# Patient Record
Sex: Female | Born: 1975 | Race: Black or African American | Hispanic: No | Marital: Married | Smoking: Former smoker
Health system: Southern US, Community
[De-identification: ages and names within clinical notes are randomized; demographics above are authoritative.]

## PROBLEM LIST (undated history)

## (undated) DIAGNOSIS — J45909 Unspecified asthma, uncomplicated: Secondary | ICD-10-CM

## (undated) DIAGNOSIS — J449 Chronic obstructive pulmonary disease, unspecified: Secondary | ICD-10-CM

## (undated) DIAGNOSIS — G47419 Narcolepsy without cataplexy: Secondary | ICD-10-CM

## (undated) DIAGNOSIS — E079 Disorder of thyroid, unspecified: Secondary | ICD-10-CM

## (undated) DIAGNOSIS — I509 Heart failure, unspecified: Secondary | ICD-10-CM

## (undated) DIAGNOSIS — G4733 Obstructive sleep apnea (adult) (pediatric): Secondary | ICD-10-CM

## (undated) DIAGNOSIS — E119 Type 2 diabetes mellitus without complications: Secondary | ICD-10-CM

## (undated) DIAGNOSIS — G4730 Sleep apnea, unspecified: Secondary | ICD-10-CM

## (undated) DIAGNOSIS — I10 Essential (primary) hypertension: Secondary | ICD-10-CM

## (undated) DIAGNOSIS — R0602 Shortness of breath: Secondary | ICD-10-CM

## (undated) DIAGNOSIS — F319 Bipolar disorder, unspecified: Secondary | ICD-10-CM

## (undated) DIAGNOSIS — K219 Gastro-esophageal reflux disease without esophagitis: Secondary | ICD-10-CM

## (undated) DIAGNOSIS — E039 Hypothyroidism, unspecified: Secondary | ICD-10-CM

## (undated) DIAGNOSIS — Z720 Tobacco use: Secondary | ICD-10-CM

## (undated) DIAGNOSIS — R0789 Other chest pain: Secondary | ICD-10-CM

## (undated) DIAGNOSIS — M797 Fibromyalgia: Secondary | ICD-10-CM

## (undated) DIAGNOSIS — R55 Syncope and collapse: Secondary | ICD-10-CM

## (undated) HISTORY — PX: HX GALL BLADDER SURGERY/CHOLE: SHX55

## (undated) HISTORY — DX: Narcolepsy without cataplexy: G47.419

## (undated) HISTORY — PX: SHOULDER SURGERY: SHX246

## (undated) HISTORY — DX: Shortness of breath: R06.02

## (undated) HISTORY — PX: HX TUBAL LIGATION: SHX77

## (undated) HISTORY — DX: Obstructive sleep apnea (adult) (pediatric): G47.33

## (undated) HISTORY — DX: Sleep apnea, unspecified: G47.30

## (undated) HISTORY — DX: Type 2 diabetes mellitus without complications: E11.9

## (undated) HISTORY — DX: Fibromyalgia: M79.7

## (undated) HISTORY — DX: Other chest pain: R07.89

## (undated) HISTORY — PX: OTHER SURGICAL HISTORY: SHX169

## (undated) HISTORY — DX: Bipolar disorder, unspecified: F31.9

## (undated) HISTORY — DX: Hypothyroidism, unspecified: E03.9

## (undated) HISTORY — DX: Tobacco use: Z72.0

## (undated) HISTORY — DX: Gastro-esophageal reflux disease without esophagitis: K21.9

## (undated) HISTORY — DX: Syncope and collapse: R55

## (undated) HISTORY — PX: CHOLECYSTECTOMY: SHX55

---
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
## 2017-02-05 ENCOUNTER — Encounter (HOSPITAL_COMMUNITY): Payer: Self-pay | Admitting: Emergency Medicine

## 2017-02-05 DIAGNOSIS — R42 Dizziness and giddiness: Secondary | ICD-10-CM | POA: Insufficient documentation

## 2017-02-05 DIAGNOSIS — R51 Headache: Secondary | ICD-10-CM | POA: Insufficient documentation

## 2017-02-05 DIAGNOSIS — R112 Nausea with vomiting, unspecified: Secondary | ICD-10-CM | POA: Insufficient documentation

## 2017-02-05 DIAGNOSIS — Z5321 Procedure and treatment not carried out due to patient leaving prior to being seen by health care provider: Secondary | ICD-10-CM | POA: Insufficient documentation

## 2017-02-05 LAB — BASIC METABOLIC PANEL
Anion gap: 11 (ref 5–15)
BUN: 6 mg/dL (ref 6–20)
CO2: 21 mmol/L — AB (ref 22–32)
CREATININE: 0.71 mg/dL (ref 0.44–1.00)
Calcium: 8.8 mg/dL — ABNORMAL LOW (ref 8.9–10.3)
Chloride: 100 mmol/L — ABNORMAL LOW (ref 101–111)
GFR calc Af Amer: >60 mL/min (ref >60)
GFR calc non Af Amer: >60 mL/min (ref >60)
Glucose, Bld: 315 mg/dL — ABNORMAL HIGH (ref 65–99)
Potassium: 3.3 mmol/L — ABNORMAL LOW (ref 3.5–5.1)
Sodium: 132 mmol/L — ABNORMAL LOW (ref 135–145)

## 2017-02-05 LAB — CBC
HEMATOCRIT: 40 % (ref 36.0–46.0)
Hemoglobin: 13.5 g/dL (ref 12.0–15.0)
MCH: 29.3 pg (ref 26.0–34.0)
MCHC: 33.8 g/dL (ref 30.0–36.0)
MCV: 87 fL (ref 78.0–100.0)
Platelets: 218 10*3/uL (ref 150–400)
RBC: 4.6 MIL/uL (ref 3.87–5.11)
RDW: 12.7 % (ref 11.5–15.5)
WBC: 8.6 10*3/uL (ref 4.0–10.5)

## 2017-02-05 LAB — CBG MONITORING, ED: Glucose-Capillary: 316 mg/dL — ABNORMAL HIGH (ref 65–99)

## 2017-02-05 NOTE — ED Triage Notes (Signed)
Reports feeling bad for a couple of weeks.  Headache today with dizziness since yesterday.  N/V today. Reports feeling like she is going to pass out.

## 2017-02-05 NOTE — ED Notes (Signed)
Spoke with dr Jacqulyn BathLong regarding neuro symptoms.  Orders received and placed.

## 2017-02-06 ENCOUNTER — Emergency Department (HOSPITAL_COMMUNITY)
Admission: EM | Admit: 2017-02-06 | Discharge: 2017-02-06 | Disposition: A | Discharge status: Home / Self Care | Payer: Self-pay | Attending: Emergency Medicine | Admitting: Emergency Medicine

## 2017-02-06 HISTORY — DX: Essential (primary) hypertension: I10

## 2017-02-06 HISTORY — DX: Chronic obstructive pulmonary disease, unspecified: J44.9

## 2017-02-06 LAB — HCG, QUANTITATIVE, PREGNANCY

## 2017-02-06 NOTE — ED Notes (Signed)
Pt walked to desk and stated she couldn't wait any longer. Pt encouraged to stay, pt was seen walking out the lobby.

## 2017-03-15 ENCOUNTER — Encounter (HOSPITAL_COMMUNITY): Payer: Self-pay | Admitting: Emergency Medicine

## 2017-03-15 ENCOUNTER — Emergency Department (HOSPITAL_COMMUNITY)
Admission: EM | Admit: 2017-03-15 | Discharge: 2017-03-15 | Disposition: A | Discharge status: Home / Self Care | Payer: Self-pay | Attending: Emergency Medicine | Admitting: Emergency Medicine

## 2017-03-15 DIAGNOSIS — E063 Autoimmune thyroiditis: Secondary | ICD-10-CM

## 2017-03-15 DIAGNOSIS — E860 Dehydration: Secondary | ICD-10-CM | POA: Insufficient documentation

## 2017-03-15 DIAGNOSIS — J449 Chronic obstructive pulmonary disease, unspecified: Secondary | ICD-10-CM | POA: Insufficient documentation

## 2017-03-15 DIAGNOSIS — I10 Essential (primary) hypertension: Secondary | ICD-10-CM | POA: Insufficient documentation

## 2017-03-15 DIAGNOSIS — E1165 Type 2 diabetes mellitus with hyperglycemia: Secondary | ICD-10-CM | POA: Insufficient documentation

## 2017-03-15 DIAGNOSIS — Z79899 Other long term (current) drug therapy: Secondary | ICD-10-CM | POA: Insufficient documentation

## 2017-03-15 DIAGNOSIS — E038 Other specified hypothyroidism: Secondary | ICD-10-CM

## 2017-03-15 DIAGNOSIS — E039 Hypothyroidism, unspecified: Secondary | ICD-10-CM | POA: Insufficient documentation

## 2017-03-15 DIAGNOSIS — R739 Hyperglycemia, unspecified: Secondary | ICD-10-CM

## 2017-03-15 DIAGNOSIS — Z794 Long term (current) use of insulin: Secondary | ICD-10-CM | POA: Insufficient documentation

## 2017-03-15 DIAGNOSIS — F17210 Nicotine dependence, cigarettes, uncomplicated: Secondary | ICD-10-CM | POA: Insufficient documentation

## 2017-03-15 LAB — COMPREHENSIVE METABOLIC PANEL
ALBUMIN: 3.7 g/dL (ref 3.5–5.0)
ALT: 26 U/L (ref 14–54)
ANION GAP: 11 (ref 5–15)
AST: 30 U/L (ref 15–41)
Alkaline Phosphatase: 108 U/L (ref 38–126)
BUN: 8 mg/dL (ref 6–20)
CHLORIDE: 99 mmol/L — AB (ref 101–111)
CO2: 22 mmol/L (ref 22–32)
Calcium: 8.9 mg/dL (ref 8.9–10.3)
Creatinine, Ser: 0.77 mg/dL (ref 0.44–1.00)
GFR calc Af Amer: >60 mL/min (ref >60)
GFR calc non Af Amer: >60 mL/min (ref >60)
Glucose, Bld: 404 mg/dL — ABNORMAL HIGH (ref 65–99)
POTASSIUM: 4.1 mmol/L (ref 3.5–5.1)
SODIUM: 132 mmol/L — AB (ref 135–145)
TOTAL PROTEIN: 6.8 g/dL (ref 6.5–8.1)
Total Bilirubin: 0.8 mg/dL (ref 0.3–1.2)

## 2017-03-15 LAB — CBC WITH DIFFERENTIAL/PLATELET
Basophils Absolute: 0 10*3/uL (ref 0.0–0.1)
Basophils Relative: 0 %
Eosinophils Absolute: 0.1 10*3/uL (ref 0.0–0.7)
Eosinophils Relative: 2 %
HEMATOCRIT: 42 % (ref 36.0–46.0)
HEMOGLOBIN: 14.5 g/dL (ref 12.0–15.0)
LYMPHS ABS: 1.8 10*3/uL (ref 0.7–4.0)
LYMPHS PCT: 26 %
MCH: 30 pg (ref 26.0–34.0)
MCHC: 34.5 g/dL (ref 30.0–36.0)
MCV: 86.8 fL (ref 78.0–100.0)
MONO ABS: 0.4 10*3/uL (ref 0.1–1.0)
Monocytes Relative: 6 %
NEUTROS ABS: 4.4 10*3/uL (ref 1.7–7.7)
Neutrophils Relative %: 66 %
Platelets: 200 10*3/uL (ref 150–400)
RBC: 4.84 MIL/uL (ref 3.87–5.11)
RDW: 12.6 % (ref 11.5–15.5)
WBC: 6.7 10*3/uL (ref 4.0–10.5)

## 2017-03-15 LAB — URINALYSIS, ROUTINE W REFLEX MICROSCOPIC
BACTERIA UA: NONE SEEN
BILIRUBIN URINE: NEGATIVE
Glucose, UA: >=500 mg/dL — AB
HGB URINE DIPSTICK: NEGATIVE
Ketones, ur: 5 mg/dL — AB
Leukocytes, UA: NEGATIVE
Nitrite: NEGATIVE
PROTEIN: NEGATIVE mg/dL
SPECIFIC GRAVITY, URINE: 1.033 — AB (ref 1.005–1.030)
pH: 5 (ref 5.0–8.0)

## 2017-03-15 LAB — I-STAT VENOUS BLOOD GAS, ED
ACID-BASE EXCESS: 2 mmol/L (ref 0.0–2.0)
BICARBONATE: 27.4 mmol/L (ref 20.0–28.0)
O2 Saturation: 68 %
PH VEN: 7.414 (ref 7.250–7.430)
TCO2: 29 mmol/L (ref 22–32)
pCO2, Ven: 42.8 mmHg — ABNORMAL LOW (ref 44.0–60.0)
pO2, Ven: 35 mmHg (ref 32.0–45.0)

## 2017-03-15 LAB — T4, FREE: FREE T4: 0.61 ng/dL (ref 0.61–1.12)

## 2017-03-15 LAB — CBG MONITORING, ED
GLUCOSE-CAPILLARY: 407 mg/dL — AB (ref 65–99)
GLUCOSE-CAPILLARY: 338 mg/dL — AB (ref 65–99)

## 2017-03-15 LAB — TSH: TSH: 25.422 u[IU]/mL — AB (ref 0.350–4.500)

## 2017-03-15 MED ORDER — LISINOPRIL 40 MG PO TABS: 40.0000 mg | ORAL_TABLET | Freq: Every day | ORAL | 0 refills | Status: DC

## 2017-03-15 MED ORDER — BUSPIRONE HCL 10 MG PO TABS: 10.0000 mg | ORAL_TABLET | Freq: Two times a day (BID) | ORAL | 0 refills | Status: DC

## 2017-03-15 MED ORDER — INSULIN ASPART 100 UNIT/ML FLEXPEN: 8.0000 [IU] | PEN_INJECTOR | Freq: Three times a day (TID) | SUBCUTANEOUS | 0 refills | Status: DC

## 2017-03-15 MED ORDER — SUCRALFATE 1 G PO TABS: 1.0000 g | ORAL_TABLET | Freq: Every day | ORAL | 0 refills | Status: DC

## 2017-03-15 MED ORDER — LORATADINE 10 MG PO TABS: 10.0000 mg | ORAL_TABLET | Freq: Every day | ORAL | 0 refills | Status: DC

## 2017-03-15 MED ORDER — DULOXETINE HCL 60 MG PO CPEP: 60.0000 mg | ORAL_CAPSULE | Freq: Every day | ORAL | 0 refills | Status: DC

## 2017-03-15 MED ORDER — OMEPRAZOLE 40 MG PO CPDR: 40.0000 mg | DELAYED_RELEASE_CAPSULE | Freq: Every day | ORAL | 0 refills | Status: DC

## 2017-03-15 MED ORDER — VITAMIN E 180 MG (400 UNIT) PO CAPS: 400.0000 [IU] | ORAL_CAPSULE | Freq: Every day | ORAL | 0 refills | Status: DC

## 2017-03-15 MED ORDER — SODIUM CHLORIDE 0.9 % IV BOLUS (SEPSIS)
1000.0000 mL | Freq: Once | INTRAVENOUS | Status: AC
  Administered 2017-03-15: 1000 mL via INTRAVENOUS

## 2017-03-15 MED ORDER — DULOXETINE HCL 30 MG PO CPEP: 30.0000 mg | ORAL_CAPSULE | Freq: Every day | ORAL | 0 refills | Status: DC

## 2017-03-15 MED ORDER — METOPROLOL TARTRATE 25 MG PO TABS: 25.0000 mg | ORAL_TABLET | Freq: Two times a day (BID) | ORAL | 0 refills | Status: DC

## 2017-03-15 MED ORDER — ALBUTEROL SULFATE HFA 108 (90 BASE) MCG/ACT IN AERS: 2.0000 | INHALATION_SPRAY | RESPIRATORY_TRACT | 0 refills | Status: DC | PRN

## 2017-03-15 MED ORDER — METHYLPHENIDATE HCL 20 MG PO TABS: 20.0000 mg | ORAL_TABLET | Freq: Three times a day (TID) | ORAL | 0 refills | Status: DC

## 2017-03-15 MED ORDER — INSULIN GLARGINE 100 UNIT/ML SOLOSTAR PEN: 30.0000 [IU] | PEN_INJECTOR | Freq: Every day | SUBCUTANEOUS | 0 refills | Status: DC

## 2017-03-15 MED ORDER — LEVOTHYROXINE SODIUM 175 MCG PO TABS
175.0000 ug | ORAL_TABLET | Freq: Once | ORAL | Status: AC
  Administered 2017-03-15: 175 ug via ORAL
  Filled 2017-03-15: qty 1

## 2017-03-15 MED ORDER — MONTELUKAST SODIUM 10 MG PO TABS: 10.0000 mg | ORAL_TABLET | Freq: Every day | ORAL | 0 refills | Status: DC

## 2017-03-15 MED ORDER — METFORMIN HCL 500 MG PO TABS: 500.0000 mg | ORAL_TABLET | Freq: Two times a day (BID) | ORAL | 0 refills | Status: DC

## 2017-03-15 MED ORDER — LOVASTATIN 10 MG PO TABS: 10.0000 mg | ORAL_TABLET | Freq: Every day | ORAL | 0 refills | Status: DC

## 2017-03-15 MED ORDER — LEVOTHYROXINE SODIUM 175 MCG PO TABS: 175.0000 ug | ORAL_TABLET | Freq: Every day | ORAL | 0 refills | Status: DC

## 2017-03-15 MED ORDER — RANITIDINE HCL 300 MG PO TABS: 300.0000 mg | ORAL_TABLET | Freq: Every day | ORAL | 0 refills | Status: DC

## 2017-03-15 MED ORDER — METOCLOPRAMIDE HCL 5 MG PO TABS: 5.0000 mg | ORAL_TABLET | Freq: Every day | ORAL | 0 refills | Status: DC

## 2017-03-15 MED FILL — METOCLOPRAMIDE 5 MG TABLET: 5 | 30 days supply | Qty: 30 | Fill #0

## 2017-03-15 MED FILL — ?METFORMIN HCL 500MG TABLET: 500 | 15 days supply | Qty: 30 | Fill #0

## 2017-03-15 MED FILL — busPIRone HCL 10 MG TABS: 10 | 15 days supply | Qty: 30 | Fill #0

## 2017-03-15 MED FILL — OMEPRAZOLE DR 40 MG CAPSULE: 40 | 30 days supply | Qty: 30 | Fill #0

## 2017-03-15 MED FILL — SUCRALFATE 1 GM TABLET: 1 | 30 days supply | Qty: 30 | Fill #0

## 2017-03-15 MED FILL — ?DULOXETINE HCL DR 60 MG CA: 60 MG | 30 days supply | Qty: 30 | Fill #0

## 2017-03-15 MED FILL — raNITIdine HCL 300 MG TABS: 300 | 30 days supply | Qty: 30 | Fill #0

## 2017-03-15 MED FILL — ?MONTELUKAST SOD 10MG TAB: 10 | 30 days supply | Qty: 30 | Fill #0

## 2017-03-15 MED FILL — !NOVOLOG FLEXPEN SYRINGE 1: 100/ML | 25 days supply | Qty: 6 | Fill #0

## 2017-03-15 MED FILL — ?LEVOTHYROXINE 175 MCG TAB: 175 | 30 days supply | Qty: 30 | Fill #0

## 2017-03-15 MED FILL — !VENTOLIN HFA INHALER: 108 (90 BAS | 16 days supply | Qty: 18 | Fill #0

## 2017-03-15 MED FILL — LISINOPRIL 40 MG TABLET: 40 | 30 days supply | Qty: 30 | Fill #0

## 2017-03-15 MED FILL — ?DULOXETINE HCL DR 30 MG CA: 30 MG | 30 days supply | Qty: 30 | Fill #0

## 2017-03-15 MED FILL — !LANTUS SOLOSTAR 100UNITS/M: 100 | 30 days supply | Qty: 9 | Fill #0

## 2017-03-15 MED FILL — LOVASTATIN 10 MG TABLET: 10 | 30 days supply | Qty: 30 | Fill #0

## 2017-03-15 MED FILL — METOPROLOL TARTRATE 25 MG T: 25 | 30 days supply | Qty: 60 | Fill #0

## 2017-03-15 NOTE — ED Notes (Signed)
Critical Lab Results given to Dr.Lockwood.

## 2017-03-15 NOTE — ED Triage Notes (Signed)
Pt reports she recently moved here and had been out of htn medications as well as thyroid medication. Pt states she has been feeling fatigued and tired for 3 weeks. Pt has been feeling like her blood sugar has been going up and down she reports having her metformin, cbg in triage 400.

## 2017-03-15 NOTE — ED Provider Notes (Signed)
[facility]   CSN: [id number] Arrival date & time: 03/15/17  0900     History   Chief Complaint Chief Complaint  Patient presents with  . Hyperglycemia    HPI Karen CastleKeisha [name] is a 41 y.o. female.  HPI  Patient presents with concern of feeling poorly in general. Patient notes multiple medical issues including diabetes, thyroid disease. She notes that over the past few days, weeks since she has run out of all medication, and she has lost healthcare coverage. Now, after attempting to work today, but feeling too weak, in general, without focal concern, and with ongoing nausea, lightheadedness, she presents here for evaluation. She denies focal pain, true confusion, but states that her overall discomfort has increased, in particular over the past 2 days.   Past Medical History:  Diagnosis Date  . COPD (chronic obstructive pulmonary disease) (HCC)   . Diabetes mellitus without complication (HCC)   . Hypertension   . Thyroid disease     There are no active problems to display for this patient.   Past Surgical History:  Procedure Laterality Date  . btl    . CHOLECYSTECTOMY    . shoulder Bilateral     OB History    No data available       Home Medications    Prior to Admission medications   Medication Sig Start Date End Date Taking? Authorizing Provider  albuterol (PROVENTIL HFA;VENTOLIN HFA) 108 (90 Base) MCG/ACT inhaler Inhale 2 puffs into the lungs every 4 (four) hours as needed for wheezing or shortness of breath.   Yes [provider]  busPIRone (BUSPAR) 10 MG tablet Take 10 mg by mouth 2 (two) times daily.   Yes [provider]  Cholecalciferol (VITAMIN D3) 50000 units CAPS Take 50,000 Units by mouth every 7 (seven) days. Tuesday   Yes [provider]  doxycycline (VIBRA-TABS) 100 MG tablet Take 100 mg by mouth daily.   Yes [provider]  DULoxetine (CYMBALTA) 30 MG  capsule Take 30 mg by mouth daily. Pt takes with 60 mg to equal 90 mg   Yes [provider]  DULoxetine (CYMBALTA) 60 MG capsule Take 60 mg by mouth daily. Patient takes with 60 mg to equal 90 mg   Yes [provider]  insulin aspart (NOVOLOG FLEXPEN) 100 UNIT/ML FlexPen Inject 8 Units into the skin 3 (three) times daily with meals.   Yes [provider]  Insulin Glargine (LANTUS SOLOSTAR) 100 UNIT/ML Solostar Pen Inject 30 Units into the skin daily at 10 pm.   Yes [provider]  levothyroxine (SYNTHROID, LEVOTHROID) 175 MCG tablet Take 175 mcg by mouth daily before breakfast.   Yes [provider]  lisinopril (PRINIVIL,ZESTRIL) 40 MG tablet Take 40 mg by mouth daily.   Yes [provider]  loratadine (CLARITIN) 10 MG tablet Take 10 mg by mouth daily.   Yes [provider]  lovastatin (MEVACOR) 10 MG tablet Take 10 mg by mouth at bedtime.   Yes [provider]  metFORMIN (GLUCOPHAGE) 500 MG tablet Take 500 mg by mouth 2 (two) times daily with a meal.   Yes [provider]  methylphenidate (RITALIN) 20 MG tablet Take 20 mg by mouth 3 (three) times daily with meals.   Yes [provider]  metoCLOPramide (REGLAN) 5 MG tablet Take 5 mg by mouth at bedtime.   Yes [provider]  metoprolol tartrate (LOPRESSOR) 25 MG tablet Take 25 mg  by mouth 2 (two) times daily.   Yes [provider]  montelukast (SINGULAIR) 10 MG tablet Take 10 mg by mouth at bedtime.   Yes [provider]  omeprazole (PRILOSEC) 40 MG capsule Take 40 mg by mouth daily.   Yes [provider]  ranitidine (ZANTAC) 300 MG tablet Take 300 mg by mouth at bedtime.   Yes [provider]  sucralfate (CARAFATE) 1 g tablet Take 1 g by mouth at bedtime.   Yes [provider]  vitamin E 400 UNIT capsule Take 400 Units by mouth daily.   Yes [provider]    Family History No family history  on file.  Social History Social History  Substance Use Topics  . Smoking status: Current Every Day Smoker    Packs/day: 0.50    Types: Cigarettes  . Smokeless tobacco: Never Used  . Alcohol use Yes     Comment: reports not drinking much but drank last week     Allergies   Aspirin and Tylenol [acetaminophen]   Review of Systems Review of Systems  Constitutional:       Per HPI, otherwise negative  HENT:       Per HPI, otherwise negative  Respiratory:       Per HPI, otherwise negative  Cardiovascular:       Per HPI, otherwise negative  Gastrointestinal: Negative for vomiting.  Endocrine:       Negative aside from HPI  Genitourinary:       Neg aside from HPI   Musculoskeletal:       Per HPI, otherwise negative  Skin: Negative.   Neurological: Negative for syncope.     Physical Exam Updated Vital Signs BP 129/82   Pulse 85   Temp 98.3 F (36.8 C) (Oral)   Resp 17   SpO2 97%   Physical Exam  Constitutional: She is oriented to person, place, and time. She appears well-developed and well-nourished. No distress.  HENT:  Head: Normocephalic and atraumatic.  Eyes: Conjunctivae and EOM are normal.  Cardiovascular: Normal rate and regular rhythm.   Pulmonary/Chest: Effort normal and breath sounds normal. No stridor. No respiratory distress.  Abdominal: She exhibits no distension.  Musculoskeletal: She exhibits no edema.  Neurological: She is alert and oriented to person, place, and time. No cranial nerve deficit.  Skin: Skin is warm and dry.  Psychiatric: She has a normal mood and affect.  Nursing note and vitals reviewed.    ED Treatments / Results  Labs (all labs ordered are listed, but only abnormal results are displayed) Labs Reviewed  COMPREHENSIVE METABOLIC PANEL - Abnormal; Notable for the following:       Result Value   Sodium 132 (*)    Chloride 99 (*)    Glucose, Bld 404 (*)    All other components within normal limits  URINALYSIS, ROUTINE W  REFLEX MICROSCOPIC - Abnormal; Notable for the following:    Color, Urine STRAW (*)    Specific Gravity, Urine 1.033 (*)    Glucose, UA >=500 (*)    Ketones, ur 5 (*)    Squamous Epithelial / LPF 6-30 (*)    All other components within normal limits  CBG MONITORING, ED - Abnormal; Notable for the following:    Glucose-Capillary 407 (*)    All other components within normal limits  I-STAT VENOUS BLOOD GAS, ED - Abnormal; Notable for the following:    pCO2, Ven 42.8 (*)    All other components within  normal limits  CBC WITH DIFFERENTIAL/PLATELET  TSH  T4, FREE    Procedures Procedures (including critical care time)  Medications Ordered in ED Medications  sodium chloride 0.9 % bolus 1,000 mL (1,000 mLs Intravenous New Bag/Given 03/15/17 0939)     Initial Impression / Assessment and Plan / ED Course  I have reviewed the triage vital signs and the nursing notes.  Pertinent labs & imaging results that were available during my care of the patient were reviewed by me and considered in my medical decision making (see chart for details).  Initial labs notable for hyperglycemia, no anion gap.   Update: Patient has received IV fluids, appears calm, is resting comfortably.   Update: Patient awake, alert, after brief stimuli. Findings reviewed with the patient. I discussed patient's case with our case management team to ensure the patient has access to her medication, and follow-up care. With no anion gap, though she does have mild amount of ketones in her urine, there is low suspicion for DKA or other acute new decompensated diabetes related pathology. With reassuring vitals, improvement here following fluids, the patient was discharged in stable condition.  Final Clinical Impressions(s) / ED Diagnoses  Hyperglycemia Weakness   Gerhard Munch, MD 03/15/17 1406

## 2017-03-15 NOTE — ED Notes (Signed)
Case manager reports will put f/u information on AVS. She is not able to pre-schedule appointment at [facility], but will provide information for pt to schedule. Dr. Jeraldine LootsLockwood can give her rx for her meds, and she can take them to the wellness center to fill.

## 2017-04-11 ENCOUNTER — Emergency Department (HOSPITAL_COMMUNITY)
Admission: EM | Admit: 2017-04-11 | Discharge: 2017-04-12 | Disposition: A | Discharge status: Home / Self Care | Payer: Self-pay | Attending: Emergency Medicine | Admitting: Emergency Medicine

## 2017-04-11 ENCOUNTER — Encounter (HOSPITAL_COMMUNITY): Payer: Self-pay | Admitting: Emergency Medicine

## 2017-04-11 DIAGNOSIS — L299 Pruritus, unspecified: Secondary | ICD-10-CM | POA: Insufficient documentation

## 2017-04-11 DIAGNOSIS — I10 Essential (primary) hypertension: Secondary | ICD-10-CM | POA: Insufficient documentation

## 2017-04-11 DIAGNOSIS — R55 Syncope and collapse: Secondary | ICD-10-CM | POA: Insufficient documentation

## 2017-04-11 DIAGNOSIS — F17210 Nicotine dependence, cigarettes, uncomplicated: Secondary | ICD-10-CM | POA: Insufficient documentation

## 2017-04-11 DIAGNOSIS — Z794 Long term (current) use of insulin: Secondary | ICD-10-CM | POA: Insufficient documentation

## 2017-04-11 DIAGNOSIS — Z79899 Other long term (current) drug therapy: Secondary | ICD-10-CM | POA: Insufficient documentation

## 2017-04-11 DIAGNOSIS — F419 Anxiety disorder, unspecified: Secondary | ICD-10-CM | POA: Insufficient documentation

## 2017-04-11 DIAGNOSIS — R739 Hyperglycemia, unspecified: Secondary | ICD-10-CM

## 2017-04-11 DIAGNOSIS — E1165 Type 2 diabetes mellitus with hyperglycemia: Secondary | ICD-10-CM | POA: Insufficient documentation

## 2017-04-11 DIAGNOSIS — E039 Hypothyroidism, unspecified: Secondary | ICD-10-CM | POA: Insufficient documentation

## 2017-04-11 DIAGNOSIS — J449 Chronic obstructive pulmonary disease, unspecified: Secondary | ICD-10-CM | POA: Insufficient documentation

## 2017-04-11 DIAGNOSIS — F329 Major depressive disorder, single episode, unspecified: Secondary | ICD-10-CM | POA: Insufficient documentation

## 2017-04-11 LAB — BASIC METABOLIC PANEL
ANION GAP: 12 (ref 5–15)
BUN: 6 mg/dL (ref 6–20)
CHLORIDE: 99 mmol/L — AB (ref 101–111)
CO2: 20 mmol/L — ABNORMAL LOW (ref 22–32)
Calcium: 9.2 mg/dL (ref 8.9–10.3)
Creatinine, Ser: 0.92 mg/dL (ref 0.44–1.00)
GFR calc Af Amer: >60 mL/min (ref >60)
GLUCOSE: 496 mg/dL — AB (ref 65–99)
POTASSIUM: 4.3 mmol/L (ref 3.5–5.1)
SODIUM: 131 mmol/L — AB (ref 135–145)

## 2017-04-11 LAB — CBG MONITORING, ED
GLUCOSE-CAPILLARY: 377 mg/dL — AB (ref 65–99)
Glucose-Capillary: 481 mg/dL — ABNORMAL HIGH (ref 65–99)

## 2017-04-11 LAB — CBC
HEMATOCRIT: 44.3 % (ref 36.0–46.0)
HEMOGLOBIN: 15 g/dL (ref 12.0–15.0)
MCH: 29.4 pg (ref 26.0–34.0)
MCHC: 33.9 g/dL (ref 30.0–36.0)
MCV: 86.9 fL (ref 78.0–100.0)
Platelets: 228 10*3/uL (ref 150–400)
RBC: 5.1 MIL/uL (ref 3.87–5.11)
RDW: 12.6 % (ref 11.5–15.5)
WBC: 9 10*3/uL (ref 4.0–10.5)

## 2017-04-11 LAB — HEPATIC FUNCTION PANEL
ALT: 21 U/L (ref 14–54)
AST: 30 U/L (ref 15–41)
Albumin: 3.9 g/dL (ref 3.5–5.0)
Alkaline Phosphatase: 100 U/L (ref 38–126)
BILIRUBIN DIRECT: 0.2 mg/dL (ref 0.1–0.5)
BILIRUBIN INDIRECT: 0.4 mg/dL (ref 0.3–0.9)
TOTAL PROTEIN: 7.3 g/dL (ref 6.5–8.1)
Total Bilirubin: 0.6 mg/dL (ref 0.3–1.2)

## 2017-04-11 LAB — I-STAT TROPONIN, ED: Troponin i, poc: 0 ng/mL (ref 0.00–0.08)

## 2017-04-11 LAB — I-STAT BETA HCG BLOOD, ED (MC, WL, AP ONLY): I-stat hCG, quantitative: <5 m[IU]/mL (ref <5)

## 2017-04-11 MED ORDER — HYDROXYZINE HCL 25 MG PO TABS
25.0000 mg | ORAL_TABLET | Freq: Once | ORAL | Status: AC
  Administered 2017-04-11: 25 mg via ORAL
  Filled 2017-04-11: qty 1

## 2017-04-11 MED ORDER — SODIUM CHLORIDE 0.9 % IV BOLUS (SEPSIS)
1000.0000 mL | Freq: Once | INTRAVENOUS | Status: AC
  Administered 2017-04-11: 1000 mL via INTRAVENOUS

## 2017-04-11 MED ORDER — METOCLOPRAMIDE HCL 5 MG/ML IJ SOLN
10.0000 mg | Freq: Once | INTRAMUSCULAR | Status: AC
  Administered 2017-04-11: 10 mg via INTRAVENOUS
  Filled 2017-04-11: qty 2
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
## 2017-04-11 NOTE — ED Notes (Signed)
When pt asked why she came to the ED, pt states "I'm just so itchy and I feel like I just can't stop scratching it's everywhere". Pt admitted to also feeling dizzy while at work x3 days but is more concerned with feeling itchy.
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
## 2017-04-11 NOTE — ED Triage Notes (Signed)
Pt to ER with complaints of worsening fatigue and weakness since yesterday, states when she stands up "it feels like my lights are going out." States has been taking her medications correctly but in unsure of her blood glucose readings due to no meter. Pt is a/o x4. NAD.

## 2017-04-12 NOTE — ED Provider Notes (Signed)
Patient signed out to me at shift change.  Plan for discharge after fluids.  1:43 AM Patient has finished fluids.  Requesting discharge.   Roxy HorsemanBrowning, Olga Seyler, PA-C 04/12/17 0144    Nira Connardama, Pedro Eduardo, MD 04/12/17 2111

## 2017-04-17 ENCOUNTER — Encounter (INDEPENDENT_AMBULATORY_CARE_PROVIDER_SITE_OTHER): Payer: Self-pay

## 2017-04-17 ENCOUNTER — Encounter: Payer: Self-pay | Admitting: Internal Medicine

## 2017-04-17 ENCOUNTER — Ambulatory Visit: Payer: Self-pay | Admitting: Internal Medicine

## 2017-04-17 DIAGNOSIS — K3184 Gastroparesis: Secondary | ICD-10-CM

## 2017-04-17 DIAGNOSIS — E1143 Type 2 diabetes mellitus with diabetic autonomic (poly)neuropathy: Secondary | ICD-10-CM

## 2017-04-17 DIAGNOSIS — R000 Tachycardia, unspecified: Secondary | ICD-10-CM

## 2017-04-17 DIAGNOSIS — K219 Gastro-esophageal reflux disease without esophagitis: Secondary | ICD-10-CM | POA: Insufficient documentation

## 2017-04-17 DIAGNOSIS — E119 Type 2 diabetes mellitus without complications: Secondary | ICD-10-CM

## 2017-04-17 DIAGNOSIS — M797 Fibromyalgia: Secondary | ICD-10-CM

## 2017-04-17 DIAGNOSIS — Z886 Allergy status to analgesic agent status: Secondary | ICD-10-CM

## 2017-04-17 DIAGNOSIS — Z8711 Personal history of peptic ulcer disease: Secondary | ICD-10-CM

## 2017-04-17 DIAGNOSIS — Z0189 Encounter for other specified special examinations: Secondary | ICD-10-CM

## 2017-04-17 DIAGNOSIS — Z9049 Acquired absence of other specified parts of digestive tract: Secondary | ICD-10-CM

## 2017-04-17 DIAGNOSIS — E039 Hypothyroidism, unspecified: Secondary | ICD-10-CM

## 2017-04-17 DIAGNOSIS — I10 Essential (primary) hypertension: Secondary | ICD-10-CM

## 2017-04-17 DIAGNOSIS — Z7951 Long term (current) use of inhaled steroids: Secondary | ICD-10-CM

## 2017-04-17 DIAGNOSIS — Z79899 Other long term (current) drug therapy: Secondary | ICD-10-CM

## 2017-04-17 DIAGNOSIS — F17210 Nicotine dependence, cigarettes, uncomplicated: Secondary | ICD-10-CM

## 2017-04-17 DIAGNOSIS — Z794 Long term (current) use of insulin: Secondary | ICD-10-CM

## 2017-04-17 DIAGNOSIS — E1159 Type 2 diabetes mellitus with other circulatory complications: Secondary | ICD-10-CM | POA: Insufficient documentation

## 2017-04-17 DIAGNOSIS — Z8349 Family history of other endocrine, nutritional and metabolic diseases: Secondary | ICD-10-CM

## 2017-04-17 DIAGNOSIS — E118 Type 2 diabetes mellitus with unspecified complications: Secondary | ICD-10-CM

## 2017-04-17 DIAGNOSIS — Z9851 Tubal ligation status: Secondary | ICD-10-CM

## 2017-04-17 DIAGNOSIS — F319 Bipolar disorder, unspecified: Secondary | ICD-10-CM | POA: Insufficient documentation

## 2017-04-17 DIAGNOSIS — J449 Chronic obstructive pulmonary disease, unspecified: Secondary | ICD-10-CM | POA: Insufficient documentation

## 2017-04-17 DIAGNOSIS — Z8249 Family history of ischemic heart disease and other diseases of the circulatory system: Secondary | ICD-10-CM

## 2017-04-17 DIAGNOSIS — Z79890 Hormone replacement therapy (postmenopausal): Secondary | ICD-10-CM

## 2017-04-17 DIAGNOSIS — G47411 Narcolepsy with cataplexy: Secondary | ICD-10-CM | POA: Insufficient documentation

## 2017-04-17 DIAGNOSIS — L739 Follicular disorder, unspecified: Secondary | ICD-10-CM

## 2017-04-17 DIAGNOSIS — K760 Fatty (change of) liver, not elsewhere classified: Secondary | ICD-10-CM

## 2017-04-17 HISTORY — DX: Gastro-esophageal reflux disease without esophagitis: K21.9

## 2017-04-17 HISTORY — DX: Fibromyalgia: M79.7

## 2017-04-17 HISTORY — DX: Hypothyroidism, unspecified: E03.9

## 2017-04-17 HISTORY — DX: Type 2 diabetes mellitus without complications: E11.9

## 2017-04-17 HISTORY — DX: Bipolar disorder, unspecified: F31.9

## 2017-04-17 LAB — POCT GLYCOSYLATED HEMOGLOBIN (HGB A1C): Hemoglobin A1C: 13.5

## 2017-04-17 LAB — GLUCOSE, CAPILLARY: GLUCOSE-CAPILLARY: 403 mg/dL — AB (ref 65–99)

## 2017-04-17 MED ORDER — METFORMIN HCL 500 MG PO TABS: 1000.0000 mg | ORAL_TABLET | Freq: Two times a day (BID) | ORAL | 0 refills | Status: DC

## 2017-04-17 MED ORDER — DULOXETINE HCL 60 MG PO CPEP: 60.0000 mg | ORAL_CAPSULE | Freq: Every day | ORAL | 0 refills | Status: DC

## 2017-04-17 MED ORDER — MONTELUKAST SODIUM 10 MG PO TABS: 10.0000 mg | ORAL_TABLET | Freq: Every day | ORAL | 0 refills | Status: DC

## 2017-04-17 MED ORDER — INSULIN ASPART 100 UNIT/ML FLEXPEN: 10.0000 [IU] | PEN_INJECTOR | Freq: Three times a day (TID) | SUBCUTANEOUS | 0 refills | Status: DC

## 2017-04-17 MED ORDER — RANITIDINE HCL 300 MG PO TABS: 300.0000 mg | ORAL_TABLET | Freq: Every day | ORAL | 0 refills | Status: DC

## 2017-04-17 MED ORDER — METFORMIN HCL 500 MG PO TABS: 500.0000 mg | ORAL_TABLET | Freq: Two times a day (BID) | ORAL | 0 refills | Status: DC

## 2017-04-17 MED ORDER — LOVASTATIN 10 MG PO TABS: 10.0000 mg | ORAL_TABLET | Freq: Every day | ORAL | 0 refills | Status: DC

## 2017-04-17 MED ORDER — INSULIN GLARGINE 100 UNIT/ML SOLOSTAR PEN: 35.0000 [IU] | PEN_INJECTOR | Freq: Every day | SUBCUTANEOUS | 0 refills | Status: DC

## 2017-04-17 MED ORDER — METOPROLOL TARTRATE 25 MG PO TABS: 25.0000 mg | ORAL_TABLET | Freq: Two times a day (BID) | ORAL | 0 refills | Status: DC

## 2017-04-17 MED ORDER — LEVOTHYROXINE SODIUM 175 MCG PO TABS: 175.0000 ug | ORAL_TABLET | Freq: Every day | ORAL | 0 refills | Status: DC

## 2017-04-17 MED ORDER — INSULIN ASPART 100 UNIT/ML FLEXPEN: 8.0000 [IU] | PEN_INJECTOR | Freq: Three times a day (TID) | SUBCUTANEOUS | 0 refills | Status: DC

## 2017-04-17 MED ORDER — METOCLOPRAMIDE HCL 5 MG PO TABS: 5.0000 mg | ORAL_TABLET | Freq: Every day | ORAL | 0 refills | Status: DC

## 2017-04-17 MED ORDER — ALBUTEROL SULFATE HFA 108 (90 BASE) MCG/ACT IN AERS: 2.0000 | INHALATION_SPRAY | RESPIRATORY_TRACT | 0 refills | Status: DC | PRN

## 2017-04-17 MED ORDER — OMEPRAZOLE 40 MG PO CPDR: 40.0000 mg | DELAYED_RELEASE_CAPSULE | Freq: Every day | ORAL | 0 refills | Status: DC

## 2017-04-17 MED ORDER — INSULIN GLARGINE 100 UNIT/ML SOLOSTAR PEN: 30.0000 [IU] | PEN_INJECTOR | Freq: Every day | SUBCUTANEOUS | 0 refills | Status: DC

## 2017-04-17 MED ORDER — SUCRALFATE 1 G PO TABS: 1.0000 g | ORAL_TABLET | Freq: Every day | ORAL | 0 refills | Status: DC

## 2017-04-17 MED ORDER — DULOXETINE HCL 30 MG PO CPEP: 30.0000 mg | ORAL_CAPSULE | Freq: Every day | ORAL | 0 refills | Status: DC

## 2017-04-17 MED ORDER — LISINOPRIL 40 MG PO TABS: 40.0000 mg | ORAL_TABLET | Freq: Every day | ORAL | 0 refills | Status: DC
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
## 2017-04-17 NOTE — Assessment & Plan Note (Signed)
COPD: Assessment: [facility] Currently: Albuterol ventolin inhaler PRN Singular inhaler BID  Plan: Continue Singular  Albuterol inhaler

## 2017-04-17 NOTE — Assessment & Plan Note (Signed)
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 Was later told she had narcolepsy but dit not know she was falling asleep. Falling without knowledge Plan: Methylphenidate 20mg  TID w/ meals [facility] not [facility]. Will fill upon obtaining confirmation for regulatory purposes.

## 2017-04-17 NOTE — Assessment & Plan Note (Addendum)
Hypothyroidism: Assessment: Patient's most recent TSH on 10/17 ws 25.422 with a free T4 of 0.61(bottom of lower limit normal) Was off of her medications for six weeks prior to this visit.  Patient was asked about biotin use, denied recent use, most recently in April. Feeling unwell today.  Plan: Check TSH and free T4 again today Will hold off on adjustment at this time as the patient has been off of her medication for an extended period of time.

## 2017-04-17 NOTE — Assessment & Plan Note (Signed)
Fibromyalgia: Assessment: Controlled on Cymbalta 90mg  daily  Plan: Continue Cymbalta 90mg  daily Patient informed that she must call [facility] for additional refills on this medication.

## 2017-04-17 NOTE — Assessment & Plan Note (Signed)
GERD and Gastric ulcers: Assessment: History of GERD. Need medical history information.  Plan: Omeprazole 40mg  daily Ranitidin 300mg  QHS

## 2017-04-17 NOTE — Assessment & Plan Note (Addendum)
Diabetes Mellitus: Assessment: On metformin 500mg  BID Insulin glargine 30units QHS Insulin aspart 8 units TID with meals  Plan: Ordered HgbA1c and CBG Insulin glargine to 30 units QHS Insulin aspart 8 units TID w/ meals Please adjust these as indicated on her next visit  Addendum: HgbA1c 13.5 CBG >400 Increased Glargine to 35units QHS Increased aspart 10 units TID w/ meals Increased Metformin to 1000mg  BID  Patient is in need of a glucometer as of our last conversation. She stated she was not sure she could afford one out of pocket. Please discuss a meter from our clinic.

## 2017-04-17 NOTE — Patient Instructions (Signed)
I have refilled your medications today. The scripts [facility].  Please be sure to return within one month, preferably three weeks for medication refills, evaluation of medical records and any additional needs you may have.  If you have any additional symptoms please feel free to contact our clinic at any time 24/7 as we have an on call physician who can return your requests.  [facility] you [facility].

## 2017-04-18 ENCOUNTER — Other Ambulatory Visit: Payer: Self-pay | Admitting: Pharmacist

## 2017-04-18 DIAGNOSIS — Z794 Long term (current) use of insulin: Principal | ICD-10-CM

## 2017-04-18 DIAGNOSIS — E118 Type 2 diabetes mellitus with unspecified complications: Secondary | ICD-10-CM

## 2017-04-18 LAB — TSH: TSH: 1.14 u[IU]/mL (ref 0.450–4.500)

## 2017-04-18 LAB — T4, FREE: Free T4: 2.06 ng/dL — ABNORMAL HIGH (ref 0.82–1.77)

## 2017-04-18 MED ORDER — INSULIN PEN NEEDLE 32G X 4 MM MISC: 1.0000 | Freq: Four times a day (QID) | 0 refills | Status: DC

## 2017-04-18 MED ORDER — LEVOTHYROXINE SODIUM 150 MCG PO TABS: 150.0000 ug | ORAL_TABLET | Freq: Every day | ORAL | 0 refills | Status: DC

## 2017-04-18 MED ORDER — INSULIN DETEMIR 100 UNIT/ML FLEXPEN: 35.0000 [IU] | PEN_INJECTOR | Freq: Every day | SUBCUTANEOUS | 0 refills | Status: DC

## 2017-04-18 MED FILL — LEVEMIR FLEXTOUCH 100 UNITS: 100 | 30 days supply | Qty: 15 | Fill #0

## 2017-04-18 MED FILL — UNIFINE PENTIPS 32GX5/32: 32G X 4 MM | 25 days supply | Qty: 100 | Fill #0

## 2017-04-18 MED FILL — UNIFINE PENTIPS 32GX5/32": 32G X 4 MM | 25 days supply | Qty: 100 | Fill #0

## 2017-04-18 MED FILL — LEVOTHYROXINE 150 MCG TAB: 150 | 30 days supply | Qty: 30 | Fill #0

## 2017-04-18 NOTE — Progress Notes (Signed)
[name] patient answered but appeared to have just risen from sleep. She was notified of her elevated free T4 level to >2 and the corresponding change to her levothyroxine to 150mcg down from 175mcg. In addition her HgbA1c of 13.5 was revealed as well as the corresponding change in her insulin to 35 units long acting QHS and 10 units per meal of the short acting. This represents a change from 30 long acting and 8 short which she stated she was taking on schedule. Her recent ED visits and clinic visits revealed that her blood glucose levels have been near or above 400.

## 2017-04-18 NOTE — Progress Notes (Signed)
Internal Medicine Clinic Attending  I saw and evaluated the patient.  I personally confirmed the key portions of the history and exam documented by Dr. Harbrecht and I reviewed pertinent patient test results.  The assessment, diagnosis, and plan were formulated together and I agree with the documentation in the resident's note.  

## 2017-04-18 NOTE — Addendum Note (Signed)
Addended by: Evelena LeydenHARBRECHT, Prisma Decarlo E on: 04/18/2017 08:37 AM   Modules accepted: Orders

## 2017-04-19 ENCOUNTER — Encounter: Payer: Self-pay | Admitting: *Deleted

## 2017-04-19 MED FILL — NOVOLOG FLEXPEN SYRINGE: 100 | 50 days supply | Qty: 15 | Fill #0

## 2017-04-27 ENCOUNTER — Ambulatory Visit: Payer: Self-pay | Admitting: Internal Medicine

## 2017-04-27 DIAGNOSIS — B9789 Other viral agents as the cause of diseases classified elsewhere: Secondary | ICD-10-CM

## 2017-04-27 DIAGNOSIS — J069 Acute upper respiratory infection, unspecified: Secondary | ICD-10-CM

## 2017-04-27 MED ORDER — GUAIFENESIN-CODEINE 100-10 MG/5ML PO SOLN: 5.0000 mL | ORAL | 0 refills | Status: DC | PRN

## 2017-04-27 MED FILL — CHERATUSSIN AC SYRUP: 100-10 | 4 days supply | Qty: 120 | Fill #0

## 2017-04-27 MED FILL — metFORMIN HCL 500 MG TABS: 500 | 30 days supply | Qty: 120 | Fill #0

## 2017-04-27 NOTE — Patient Instructions (Signed)
[name],  I think you have an early viral infection. I am going to give you a medication to help with the cough and mucous. You can use tylenol and ibuprofen as needed for fevers/chills.   Please come back in 1-2 weeks for re-check.  FOLLOW-UP INSTRUCTIONS When: 1-2 weeks in [facility]: Cough What to bring: Your medications

## 2017-04-27 NOTE — Progress Notes (Signed)
   CC: Cough and congestion  HPI:  [name] is a 41 y.o. female with a past medical history listed below here today with complaints of cough and congestion.  She reports that upon waking yesterday she had sinus congestion and cough. Notes cough with green/yellow sputum production. Reports some shortness of breath but unchanged from her baseline. Sinus congestion without sinus pressure. Does note fullness in her ears but no pain or decreased hearing. Does endorse rhinorrhea with green/yellow mucous. Denies any fevers or chills. No sick contacts. Did not receive a flu shot this year. Reports body aches but unchanged from her baseline.   She notes having bronchitis 4 times in the last year with PNA twice. She does still smoke and has history of COPD listed on her chart but no records have been scanned into our system (established 11/19). She does use albuterol prn and Singulair bid. Denies increased use in her albuterol inhaler.   Past Medical History:  Diagnosis Date  . COPD (chronic obstructive pulmonary disease) (HCC)   . Diabetes mellitus without complication (HCC)   . Hypertension   . Thyroid disease    Review of Systems:   Negative except as noted in HPI  Physical Exam:  Vitals:   04/27/17 1111  BP: 140/78  Pulse: 78  Temp: 98.2 F (36.8 C)  TempSrc: Oral  SpO2: 97%  Weight: 259 lb 12.8 oz (117.8 kg)  Height: 5\' 7"  (1.702 m)   Physical Exam  Constitutional: She is well-developed, well-nourished, and in no distress.  HENT:  Head: Normocephalic and atraumatic.  Right Ear: External ear normal.  Left Ear: External ear normal.  Nose: Nose normal.  Mouth/Throat: Oropharynx is clear and moist. No oropharyngeal exudate.  Eyes: Conjunctivae are normal. Pupils are equal, round, and reactive to light. Right eye exhibits no discharge. Left eye exhibits no discharge.  Cardiovascular: Normal rate, regular rhythm and normal heart sounds.  Pulmonary/Chest: Effort normal and  breath sounds normal. No respiratory distress. She has no wheezes. She has no rales.  Abdominal: Soft. Bowel sounds are normal. She exhibits no distension. There is no tenderness.  Lymphadenopathy:    She has no cervical adenopathy.  Skin: Skin is warm and dry. No rash noted.    Assessment & Plan:   See Encounters Tab for problem based charting.  Patient discussed with Dr. Criselda PeachesMullen

## 2017-04-28 DIAGNOSIS — J069 Acute upper respiratory infection, unspecified: Secondary | ICD-10-CM | POA: Insufficient documentation

## 2017-04-28 DIAGNOSIS — B9789 Other viral agents as the cause of diseases classified elsewhere: Principal | ICD-10-CM

## 2017-04-28 NOTE — Progress Notes (Signed)
Internal Medicine Clinic Attending  Case discussed with Dr. Boswell at the time of the visit.  We reviewed the resident's history and exam and pertinent patient test results.  I agree with the assessment, diagnosis, and plan of care documented in the resident's note.  

## 2017-04-28 NOTE — Assessment & Plan Note (Signed)
Patient with viral URI. Exam reassuring with lung clear bilaterally. Discussed return precautions with her. Guaifenesin-codeine for her cough.   Will have patient return in 1-2 weeks for re-check given her history and comorbidities.

## 2017-05-08 ENCOUNTER — Ambulatory Visit: Payer: Self-pay | Admitting: Pharmacist

## 2017-05-08 ENCOUNTER — Encounter: Payer: Self-pay | Admitting: Internal Medicine

## 2017-05-11 ENCOUNTER — Ambulatory Visit (INDEPENDENT_AMBULATORY_CARE_PROVIDER_SITE_OTHER): Admitting: Internal Medicine

## 2017-05-11 ENCOUNTER — Encounter: Payer: Self-pay | Admitting: Internal Medicine

## 2017-05-11 VITALS — BP 138/82 | HR 76 | Temp 98.5°F | Wt 260.0 lb

## 2017-05-11 DIAGNOSIS — J449 Chronic obstructive pulmonary disease, unspecified: Secondary | ICD-10-CM

## 2017-05-11 DIAGNOSIS — I10 Essential (primary) hypertension: Secondary | ICD-10-CM

## 2017-05-11 DIAGNOSIS — E118 Type 2 diabetes mellitus with unspecified complications: Secondary | ICD-10-CM

## 2017-05-11 DIAGNOSIS — Z794 Long term (current) use of insulin: Secondary | ICD-10-CM

## 2017-05-11 DIAGNOSIS — Z79899 Other long term (current) drug therapy: Secondary | ICD-10-CM

## 2017-05-11 DIAGNOSIS — K219 Gastro-esophageal reflux disease without esophagitis: Secondary | ICD-10-CM | POA: Diagnosis not present

## 2017-05-11 DIAGNOSIS — E119 Type 2 diabetes mellitus without complications: Secondary | ICD-10-CM | POA: Diagnosis not present

## 2017-05-11 DIAGNOSIS — F17210 Nicotine dependence, cigarettes, uncomplicated: Secondary | ICD-10-CM

## 2017-05-11 LAB — GLUCOSE, CAPILLARY: GLUCOSE-CAPILLARY: 172 mg/dL — AB (ref 65–99)

## 2017-05-11 MED ORDER — AMLODIPINE BESYLATE 5 MG PO TABS: 5.0000 mg | ORAL_TABLET | Freq: Every day | ORAL | 0 refills | Status: DC

## 2017-05-11 MED ORDER — OMEPRAZOLE 40 MG PO CPDR: 40.0000 mg | DELAYED_RELEASE_CAPSULE | Freq: Two times a day (BID) | ORAL | 0 refills | Status: DC

## 2017-05-11 NOTE — Patient Instructions (Addendum)
Thank you for seeing us in the clinic today!  You were evaluated for persistent cough. This is cough may be a result of your recent URI or may be a result of exacerbation of existing chronic medical conditions such as allergies or acid reflux. To help with your symptoms we are increasing your omeprazole/Prilosec. Please take this medication once in the morning 30 minutes before breakfast and once at bedtime. Please continue this medication change until follow up with your primary care physician, which is already scheduled for 07/10/2016. Please start nasal irrigation to help with your sinus congestion. The instructions are provided below. Please do this daily for the next few weeks to see if this helps with your symptoms.   Smoking cessation will also help with your chronic cough. For resources regarding smoking cessation visit [email or web address] or call 1-800-QUIT-NOW ([phone])796-8789).  Please return to the clinic as scheduled to meet with your primary care physician on 07/10/2017 to discuss your chronic medical conditions.   If you have any questions or concerns, please call our clinic at [phone] between the hours of 9am-5pm. If you have a problem after these hours, please call [phone] and ask for the internal medicine resident on call. If you feel you are having a medical emergency please call 911.   Thanks, Dr. Jeanella FlatteryMarybeth Kely Dohn  BUFFERED ISOTONIC SALINE NASAL IRRIGATION The Benefits: 1. When you irrigate, the isotonic saline (salt water) acts as a solvent and washes the mucus crusts and other debris from your nose.  2. This decongests and improves the airflow into your nose. The sinus passages begin to open.  3. Studies have also shown that a salt water and an alkaline (baking soda) irrigation solution improves nasal membrane cell function (mucociliary flow of mucus debris).  The Recipe: 1. Choose a 1-quart glass jar that is thoroughly cleansed.  2. Fill with sterile or distilled  water, or you can boil water from the tap.  3. Add 1 to 2 heaping teaspoons of "pickling/canning/sea" salt (NOT table salt as it contains a large number of additives). This salt is available at the grocery store in the food canning section.  4. Add 1 teaspoon of [employer] Hammer Baking Soda (pure bicarbonate).  5. Mix ingredients together and store at room temperature. Discard after one week. If you find this solution too strong, you may decrease the amount of salt added to 1 to 1  teaspoons. With children it is often best to start with a milder solution and advance slowly. Irrigate with 240 ml (8 oz) twice daily.  The Instructions: You should plan to irrigate your nose with buffered isotonic saline 2 times per day. Many people prefer to warm the solution slightly in the microwave - but be sure that the solution is NOT HOT. Stand over the sink (some do this in the shower) and squirt the solution into each side of your nose, keeping your mouth open. This allows you to spit the saltwater out of your mouth. It will not harm you if you swallow a little.  If you have been told to use a nasal steroid such as Flonase, Nasonex, or Nasacort, you should always use isortonic saline solution first, then use your nasal steroid product. The nasal steroid is much more effective when sprayed onto clean nasal membranes and the steroid medicine will reach deeper into the nose.  Most people experience a little burning sensation the first few times they use a isotonic saline solution, but this usually goes away within a  few days.

## 2017-05-11 NOTE — Assessment & Plan Note (Addendum)
Patient's BP at goal of <140/90 today on regimen of lisinopril 40 mg daily, amlodipine 5 mg, and metoprolol 25 mg BID. Amlodipine 5 mg added to her current medication list, since this medication not previously on list.   Plan: -Continue current regimen of amlodipine 5 mg, lisinopril 40 mg, and metoprolol 25 mg BID -RTC as scheduled on 07/10/2016 with PCP for management of chronic medical conditions

## 2017-05-11 NOTE — Assessment & Plan Note (Addendum)
Last A1C on 04/17/2017 was 13.5%. At that time patient's insulin regimen was changed to 10 units Novolog TID with meals and 35 units Levemir qhs. Patient also takes metformin 1000 mg BID. No problems with this medications and changes implemented without difficulty. CBG elevated today and no reports of hypoglycemia. No changes made at this time since changes made on 04/17/17 visit and patient has yet to see PCP to establish care.   Plan: -Continue 10 units Novolog TID with meals and 35 units Levemir qhs.  -Continue metformin 1000 mg BID -Recommend SGLT2 inhibitor at next visit

## 2017-05-11 NOTE — Assessment & Plan Note (Addendum)
Patient's symptoms of nighttime cough may represent post-viral reactive airway disease, worsening of baseline GERD, or continued post-nasal drip 2/2 recent URI/exacerbation of baseline allergies. To address this issue, will plan to increase omeprazole from once a day to twice a day. Advised patient to take this medication prior to eating a meal. Will plan to do this for at least 1 month to see if this helps with symptoms. Patient advised to complete 4 weeks of therapy to see if this helps and return to once a day dosing if her cough does not improve. Patient's cough may also be 2/2 post-nasal drip (multifactorial with recent URI and hx of allergies). Patient instructed to continue use of OTC allergy medications and to initiate daily regimen of nasal saline irrigation to help with congestion. Patient instructions on how to do this in the clinic (written instructions also provided) and told to do this for at least 2 weeks to see if this helps with symptoms. Patient told to return to clinic as scheduled for follow up with PCP.  Plan: -Increase omeprazole from 20 mg daily to BID -Daily nasal irrigation daily for at least 2 weeks to see if this improves cough -RTC as scheduled with PCP for management of chronic conditions

## 2017-05-11 NOTE — Progress Notes (Signed)
   CC: GERD and HTN follow up  HPI:  Karen [name] is a 41 y.o. with PMH of COPD, GERD, T2DM, and HTN who presents for management of chronic medical conditions. At recent visit on 04/27/2017 patient diagnosed with URI and told to follow up in clinic to ensure resolution of symptoms given multiple comorbidities including COPD, T2DM, GERD, and HTN. Since her last visit the patient's cough has slightly improved but she continues to complain of an intermittently productive cough which occurs mainly during the nighttime. She does not look at the sputum when it is productive. She denies fevers, chills, worsening of her congestion, SOB, and increased use of albuterol inhaler.  Past Medical History: Past Medical History:  Diagnosis Date  . COPD (chronic obstructive pulmonary disease) (HCC)   . Diabetes mellitus without complication (HCC)   . Hypertension   . Thyroid disease    Review of Systems:  Patient endorses intermittent productive cough, worse at night, as per HPI Patient denies chest pain, shortness of breath, abdominal pain, diaphoresis, nausea/vomiting, lower extremity swelling, and change in bowel/bladder habits.  Physical Exam:  Vitals:   05/11/17 1328  BP: 138/82  Pulse: 76  Temp: 98.5 F (36.9 C)  TempSrc: Oral  SpO2: 98%  Weight: 260 lb (117.9 kg)    Physical Exam  Constitutional: She appears well-developed and well-nourished. No distress.  HENT:  Mouth/Throat: Oropharynx is clear and moist. No oropharyngeal exudate.  Eyes: Conjunctivae are normal. Right eye exhibits no discharge. Left eye exhibits no discharge.  Cardiovascular: Normal rate, regular rhythm, normal heart sounds and intact distal pulses. Exam reveals no friction rub.  No murmur heard. Respiratory: Effort normal. No respiratory distress. She has no wheezes.  Musculoskeletal: She exhibits no edema (of bilateral lower extremities) or tenderness (of bilateral lower extremities).  Lymphadenopathy:    She  has no cervical adenopathy.  Skin: Skin is warm and dry. No rash noted. She is not diaphoretic. No erythema.   Assessment & Plan:   See Encounters Tab for problem based charting.  Patient seen with Dr. Oswaldo DoneVincent

## 2017-05-12 ENCOUNTER — Telehealth: Payer: Self-pay

## 2017-05-12 NOTE — Progress Notes (Signed)
Internal Medicine Clinic Attending  I saw and evaluated the patient.  I personally confirmed the key portions of the history and exam documented by Dr. Nedrud and I reviewed pertinent patient test results.  The assessment, diagnosis, and plan were formulated together and I agree with the documentation in the resident's note.  

## 2017-05-12 NOTE — Telephone Encounter (Signed)
Needs to speak with a nurse about mail order for meds. Please call pt back.

## 2017-05-12 NOTE — Telephone Encounter (Signed)
[place], she has forms that need to be filled out and will bring those in Monday 12/17

## 2017-05-31 ENCOUNTER — Other Ambulatory Visit: Payer: Self-pay | Admitting: *Deleted

## 2017-05-31 ENCOUNTER — Other Ambulatory Visit: Payer: Self-pay | Admitting: Internal Medicine

## 2017-05-31 DIAGNOSIS — I10 Essential (primary) hypertension: Secondary | ICD-10-CM

## 2017-05-31 DIAGNOSIS — E039 Hypothyroidism, unspecified: Secondary | ICD-10-CM

## 2017-05-31 DIAGNOSIS — M797 Fibromyalgia: Secondary | ICD-10-CM

## 2017-05-31 DIAGNOSIS — E118 Type 2 diabetes mellitus with unspecified complications: Secondary | ICD-10-CM

## 2017-05-31 DIAGNOSIS — J449 Chronic obstructive pulmonary disease, unspecified: Secondary | ICD-10-CM

## 2017-05-31 DIAGNOSIS — Z794 Long term (current) use of insulin: Secondary | ICD-10-CM

## 2017-05-31 DIAGNOSIS — K219 Gastro-esophageal reflux disease without esophagitis: Secondary | ICD-10-CM

## 2017-06-01 ENCOUNTER — Other Ambulatory Visit: Payer: Self-pay

## 2017-06-01 ENCOUNTER — Ambulatory Visit (INDEPENDENT_AMBULATORY_CARE_PROVIDER_SITE_OTHER): Admitting: Internal Medicine

## 2017-06-01 VITALS — BP 129/68 | HR 92 | Temp 98.1°F | Ht 67.0 in | Wt 262.3 lb

## 2017-06-01 DIAGNOSIS — J069 Acute upper respiratory infection, unspecified: Secondary | ICD-10-CM

## 2017-06-01 DIAGNOSIS — K219 Gastro-esophageal reflux disease without esophagitis: Secondary | ICD-10-CM | POA: Diagnosis not present

## 2017-06-01 DIAGNOSIS — I10 Essential (primary) hypertension: Secondary | ICD-10-CM | POA: Diagnosis not present

## 2017-06-01 DIAGNOSIS — J449 Chronic obstructive pulmonary disease, unspecified: Secondary | ICD-10-CM | POA: Diagnosis not present

## 2017-06-01 DIAGNOSIS — F329 Major depressive disorder, single episode, unspecified: Secondary | ICD-10-CM

## 2017-06-01 DIAGNOSIS — E119 Type 2 diabetes mellitus without complications: Secondary | ICD-10-CM | POA: Diagnosis not present

## 2017-06-01 DIAGNOSIS — B9789 Other viral agents as the cause of diseases classified elsewhere: Principal | ICD-10-CM

## 2017-06-01 DIAGNOSIS — R05 Cough: Secondary | ICD-10-CM | POA: Diagnosis not present

## 2017-06-01 DIAGNOSIS — E039 Hypothyroidism, unspecified: Secondary | ICD-10-CM

## 2017-06-01 DIAGNOSIS — F17210 Nicotine dependence, cigarettes, uncomplicated: Secondary | ICD-10-CM

## 2017-06-01 MED ORDER — METFORMIN HCL 500 MG PO TABS: 1000.0000 mg | ORAL_TABLET | Freq: Two times a day (BID) | ORAL | 3 refills | Status: DC

## 2017-06-01 MED ORDER — INSULIN ASPART 100 UNIT/ML FLEXPEN: 10.0000 [IU] | PEN_INJECTOR | Freq: Three times a day (TID) | SUBCUTANEOUS | 3 refills | Status: DC

## 2017-06-01 MED ORDER — INSULIN DETEMIR 100 UNIT/ML FLEXPEN: 35.0000 [IU] | PEN_INJECTOR | Freq: Every day | SUBCUTANEOUS | 3 refills | Status: DC

## 2017-06-01 MED ORDER — LEVOTHYROXINE SODIUM 150 MCG PO TABS
150.0000 ug | ORAL_TABLET | Freq: Every day | ORAL | 0 refills | Status: DC
Start: 2017-06-01 — End: 2017-09-12

## 2017-06-01 MED ORDER — METOPROLOL TARTRATE 25 MG PO TABS: 25.0000 mg | ORAL_TABLET | Freq: Two times a day (BID) | ORAL | 3 refills | Status: DC

## 2017-06-01 MED ORDER — OSELTAMIVIR PHOSPHATE 75 MG PO CAPS: 75.0000 mg | ORAL_CAPSULE | Freq: Two times a day (BID) | ORAL | 0 refills | Status: AC

## 2017-06-01 MED ORDER — AMLODIPINE BESYLATE 5 MG PO TABS: 5.0000 mg | ORAL_TABLET | Freq: Every day | ORAL | 3 refills | Status: DC

## 2017-06-01 MED ORDER — MONTELUKAST SODIUM 10 MG PO TABS: 10.0000 mg | ORAL_TABLET | Freq: Every day | ORAL | 3 refills | Status: DC

## 2017-06-01 MED ORDER — SUCRALFATE 1 G PO TABS: 1.0000 g | ORAL_TABLET | Freq: Every day | ORAL | 1 refills | Status: DC

## 2017-06-01 MED ORDER — OMEPRAZOLE 40 MG PO CPDR: 40.0000 mg | DELAYED_RELEASE_CAPSULE | Freq: Two times a day (BID) | ORAL | 3 refills | Status: DC

## 2017-06-01 MED ORDER — LISINOPRIL 40 MG PO TABS: 40.0000 mg | ORAL_TABLET | Freq: Every day | ORAL | 3 refills | Status: DC

## 2017-06-01 MED ORDER — INSULIN PEN NEEDLE 32G X 4 MM MISC: 1.0000 | Freq: Four times a day (QID) | 3 refills | Status: DC

## 2017-06-01 MED ORDER — ALBUTEROL SULFATE HFA 108 (90 BASE) MCG/ACT IN AERS: 2.0000 | INHALATION_SPRAY | RESPIRATORY_TRACT | 3 refills | Status: DC | PRN

## 2017-06-01 MED ORDER — LOVASTATIN 10 MG PO TABS: 10.0000 mg | ORAL_TABLET | Freq: Every day | ORAL | 1 refills | Status: DC

## 2017-06-01 MED FILL — OSELTAMIVIR PHOS 75 MG CAP: 75 | 5 days supply | Qty: 10 | Fill #0

## 2017-06-01 NOTE — Telephone Encounter (Signed)
Per last clinic notes, pt was [facility] Cymbalta- so am not prescribing. I have also not seen this pt in the past and with limited medical history, I am not comfortable prescribing cymbalta, and some other medicines which I have omitted.  Look forward to her clinic visit next month  Thanks

## 2017-06-01 NOTE — Progress Notes (Signed)
   CC: Cough  HPI:  [name] is a 42 y.o. F with a past medical history of COPD, GERD, DM, HTN, hypothyroid, depression who presents to the clinic with complaints of cough.   Cough: She reports a 1 day history of multiple symptoms including cough (intermittently productive of blood tinged sputum), generalized body aches, nasal congestion, and subjective fever.  She has not taken her temperature.  She notes abdominal and chest soreness with coughing.  She denies vomiting or diarrhea, states she does not have an appetite for dinner or solid food but has been drinking fluids.  She has tried using albuterol more often but denies wheezing.  She was recently in [place] for the holidays and has had several nieces and nephews with similar symptoms.  Past Medical History:  Diagnosis Date  . COPD (chronic obstructive pulmonary disease) (HCC)   . Diabetes mellitus without complication (HCC)   . Hypertension   . Thyroid disease    Review of Systems:  Review of Systems  HENT: Positive for congestion.   Respiratory: Positive for cough.   Gastrointestinal: Negative for vomiting.  Musculoskeletal: Positive for myalgias.     Physical Exam:  Vitals:   06/01/17 1352  BP: 129/68  Pulse: 92  Temp: 98.1 F (36.7 C)  TempSrc: Oral  SpO2: 99%  Weight: 262 lb 4.8 oz (119 kg)  Height: 5\' 7"  (1.702 m)   General: Sitting in chair uncomfortable with the mask on, no acute distress HEENT: Pharyngeal erythema and slightly enlarged tonsils with rightward uvula-no abnormal mass or swelling appreciated causing displacement, moist mucus membranes, no adenopathy  CV: RRR, no murmur appreciated  Resp: Clear breath sounds bilaterally with no wheezing or rales, normal work of breathing  Extr: Good peripheral pulses and cap refill Neuro: Alert and oriented x3  Skin: Warm, dry      Assessment & Plan:   See Encounters Tab for problem based charting.  Patient seen with Dr. Josem KaufmannKlima

## 2017-06-01 NOTE — Patient Instructions (Addendum)
Nice to meet you today [name].   We are going to prescribe Tamiflu to help treat this virus which could potentially be the flu.  Will also be important to treat your other symptoms with ibuprofen which you can take 800 mg every 8 hours and Tylenol 1000 mg every 6 hours.  For your cough, dextromethorphan is an over-the-counter option that works for many people.  Nasal congestion can be helped with taking warm, steamy showers or doing a saline nasal spray which can also be purchased over-the-counter.  We hope you feel better soon, if you do not start to get better in the next 3-4 days or get worse, feel free to give us a call to make another appointment.  It also may be helpful to schedule an appointment with your primary care doctor, Dr. Johnny BridgeSaraiya, to check in on your other chronic medical conditions such as diabetes.

## 2017-06-02 MED ORDER — INSULIN ASPART 100 UNIT/ML FLEXPEN: 10.0000 [IU] | PEN_INJECTOR | Freq: Three times a day (TID) | SUBCUTANEOUS | 3 refills | Status: DC

## 2017-06-02 MED ORDER — INSULIN DETEMIR 100 UNIT/ML FLEXPEN: 35.0000 [IU] | PEN_INJECTOR | Freq: Every day | SUBCUTANEOUS | 3 refills | Status: DC

## 2017-06-02 NOTE — Assessment & Plan Note (Signed)
Patient presenting with signs symptoms consistent with a viral upper respiratory illness which could potentially be influenza.  She did not receive the flu shot this year and her symptoms were fairly sudden onset, though she is currently afebrile.  Her hemoptysis is likely due to throat/upper airway irritation.  With her underlying COPD it is difficult to distinguish if this is a COPD exacerbation.  She does have increased frequency of her baseline cough as well as sputum production, however she is not complaining of dyspnea and there is no wheezing on exam.  At this point, a viral illness is favored and will treat with Tamiflu given her presentation within 48 hours and symptomatic treatment and will hold off on steroids and antibiotics for COPD.  --Tamiflu 75 mg BID x 10 days  --Symptomatic treatment

## 2017-06-09 MED FILL — LEVOTHYROXINE 150 MCG TAB: 150 | 90 days supply | Qty: 90 | Fill #0

## 2017-06-09 NOTE — Telephone Encounter (Signed)
Called pt - informed of refill.

## 2017-06-09 NOTE — Telephone Encounter (Signed)
Refill Request Per patient, "She has been waiting since 05/31/2017"  Please call the pt.   levothyroxine (SYNTHROID, LEVOTHROID) 150 MCG tablet

## 2017-06-12 NOTE — Progress Notes (Signed)
I saw and evaluated the patient.  I personally confirmed the key portions of Dr. Harden's history and exam and reviewed pertinent patient test results.  The assessment, diagnosis, and plan were formulated together and I agree with the documentation in the resident's note. 

## 2017-06-17 ENCOUNTER — Emergency Department (HOSPITAL_COMMUNITY): Admission: EM | Admit: 2017-06-17 | Discharge: 2017-06-17 | Discharge status: Against Medical Advice

## 2017-06-17 NOTE — ED Notes (Signed)
2x call for triage no answer

## 2017-06-17 NOTE — ED Notes (Signed)
1x call for triage, no answer

## 2017-06-17 NOTE — ED Notes (Signed)
3x call for triage no answer

## 2017-06-21 ENCOUNTER — Encounter: Payer: Self-pay | Admitting: Internal Medicine

## 2017-06-21 ENCOUNTER — Other Ambulatory Visit: Payer: Self-pay

## 2017-06-21 ENCOUNTER — Ambulatory Visit (INDEPENDENT_AMBULATORY_CARE_PROVIDER_SITE_OTHER): Admitting: Internal Medicine

## 2017-06-21 VITALS — BP 147/89 | HR 87 | Temp 98.0°F | Ht 67.0 in | Wt 263.5 lb

## 2017-06-21 DIAGNOSIS — W458XXA Other foreign body or object entering through skin, initial encounter: Secondary | ICD-10-CM

## 2017-06-21 DIAGNOSIS — W25XXXA Contact with sharp glass, initial encounter: Secondary | ICD-10-CM

## 2017-06-21 DIAGNOSIS — S91322A Laceration with foreign body, left foot, initial encounter: Secondary | ICD-10-CM | POA: Diagnosis not present

## 2017-06-21 DIAGNOSIS — M79672 Pain in left foot: Secondary | ICD-10-CM | POA: Insufficient documentation

## 2017-06-21 NOTE — Assessment & Plan Note (Signed)
Assessment: Left plantar foot pain Patient states that she feels something sharp on the bottom of her foot when she walks. On exam there is a small cut with foreign material under the cut. A scalpel and tweezer was used to scrap off foreign material.  The appearance of very minute shards of glass was also present.  Area was cleaned prior and after procedure and a bandage was applied.  Patient reported relief after scraping off the dead skin with foreign material.  Plan -Removal of dead skin with foreign material -Monitor, return precautions given

## 2017-06-21 NOTE — Patient Instructions (Signed)
[name],  Please call the clinic if your symptoms do not improve or worsen.

## 2017-06-21 NOTE — Progress Notes (Signed)
   CC: left foot pain  HPI:  [name] is a 42 y.o. female with history noted below that presents to the acute care clinic for left plantar foot pain that started 3-4 days ago. She states that her children broke a coffee pot and she thinks she stepped on something sharp and the pain has not gone away. The pain is worse when bearing weight.  She denies falls.  Past Medical History:  Diagnosis Date  . COPD (chronic obstructive pulmonary disease) (HCC)   . Diabetes mellitus without complication (HCC)   . Hypertension   . Thyroid disease     Review of Systems:  As per HPI   Physical Exam:  Vitals:   06/21/17 1057  BP: (!) 147/89  Pulse: 87  Temp: 98 F (36.7 C)  TempSrc: Oral  SpO2: 99%  Weight: 263 lb 8 oz (119.5 kg)  Height: 5\' 7"  (1.702 m)   Physical Exam  Constitutional: She is well-developed, well-nourished, and in no distress.  Skin:  1 mm cut on plantar surface of foot on heel.  Dark area noted under cut     Assessment & Plan:   See encounters tab for problem based medical decision making.   Patient seen with Dr. Cleda DaubE. Ewing Fandino

## 2017-06-23 NOTE — Progress Notes (Signed)
Internal Medicine Clinic Attending  I saw and evaluated the patient.  I personally confirmed the key portions of the history and exam documented by Dr. Gevon Markus and I reviewed pertinent patient test results.  The assessment, diagnosis, and plan were formulated together and I agree with the documentation in the resident's note.      

## 2017-06-26 ENCOUNTER — Encounter: Payer: Self-pay | Admitting: *Deleted

## 2017-07-03 ENCOUNTER — Emergency Department (HOSPITAL_COMMUNITY)
Admission: EM | Admit: 2017-07-03 | Discharge: 2017-07-04 | Disposition: A | Discharge status: Home / Self Care | Attending: Emergency Medicine | Admitting: Emergency Medicine

## 2017-07-03 ENCOUNTER — Encounter (HOSPITAL_COMMUNITY): Payer: Self-pay | Admitting: Family Medicine

## 2017-07-03 DIAGNOSIS — Z794 Long term (current) use of insulin: Secondary | ICD-10-CM | POA: Diagnosis not present

## 2017-07-03 DIAGNOSIS — F17210 Nicotine dependence, cigarettes, uncomplicated: Secondary | ICD-10-CM | POA: Diagnosis not present

## 2017-07-03 DIAGNOSIS — E119 Type 2 diabetes mellitus without complications: Secondary | ICD-10-CM | POA: Insufficient documentation

## 2017-07-03 DIAGNOSIS — I10 Essential (primary) hypertension: Secondary | ICD-10-CM | POA: Diagnosis not present

## 2017-07-03 DIAGNOSIS — J449 Chronic obstructive pulmonary disease, unspecified: Secondary | ICD-10-CM | POA: Diagnosis not present

## 2017-07-03 DIAGNOSIS — E039 Hypothyroidism, unspecified: Secondary | ICD-10-CM | POA: Diagnosis not present

## 2017-07-03 DIAGNOSIS — Z79899 Other long term (current) drug therapy: Secondary | ICD-10-CM | POA: Insufficient documentation

## 2017-07-03 DIAGNOSIS — R55 Syncope and collapse: Secondary | ICD-10-CM | POA: Diagnosis not present

## 2017-07-03 NOTE — ED Notes (Signed)
Bed: WTR7 Expected date:  Expected time:  Means of arrival:  Comments: 

## 2017-07-03 NOTE — ED Triage Notes (Signed)
[facility]. Per EMS, patients spouse witnessed a syncopal episode lasting no longer than 30 seconds-1 minute. When EMS arrived, patient was sitting on the ground with generalized soreness. Patient is alert, oriented x 3, except to time. Also, will intermittently not answer questions appropriately. EMS reports she reported to them that she has been dizzy for the last 3 days. She is also paranoid.

## 2017-07-03 NOTE — ED Notes (Signed)
Unable to collect labs at thistime patient states that she needs something to eat

## 2017-07-04 LAB — BASIC METABOLIC PANEL
Anion gap: 7 (ref 5–15)
BUN: 13 mg/dL (ref 6–20)
CO2: 24 mmol/L (ref 22–32)
CREATININE: 0.7 mg/dL (ref 0.44–1.00)
Calcium: 9.2 mg/dL (ref 8.9–10.3)
Chloride: 105 mmol/L (ref 101–111)
GFR calc Af Amer: >60 mL/min (ref >60)
Glucose, Bld: 182 mg/dL — ABNORMAL HIGH (ref 65–99)
POTASSIUM: 3.4 mmol/L — AB (ref 3.5–5.1)
SODIUM: 136 mmol/L (ref 135–145)

## 2017-07-04 LAB — CBC
HCT: 39.2 % (ref 36.0–46.0)
Hemoglobin: 13.2 g/dL (ref 12.0–15.0)
MCH: 28.8 pg (ref 26.0–34.0)
MCHC: 33.7 g/dL (ref 30.0–36.0)
MCV: 85.6 fL (ref 78.0–100.0)
PLATELETS: 205 10*3/uL (ref 150–400)
RBC: 4.58 MIL/uL (ref 3.87–5.11)
RDW: 13.2 % (ref 11.5–15.5)
WBC: 8.8 10*3/uL (ref 4.0–10.5)

## 2017-07-04 LAB — I-STAT BETA HCG BLOOD, ED (MC, WL, AP ONLY): I-stat hCG, quantitative: <5 m[IU]/mL (ref <5)

## 2017-07-04 MED ORDER — IBUPROFEN 200 MG PO TABS
600.0000 mg | ORAL_TABLET | Freq: Once | ORAL | Status: AC
  Administered 2017-07-04: 600 mg via ORAL
  Filled 2017-07-04: qty 3

## 2017-07-04 NOTE — Discharge Instructions (Signed)
We saw you in the ER after you had a fainting episode. All the results in the ER are normal, labs and imaging. We are not sure what is causing your symptoms. The workup in the ER is not complete, and is limited to screening for life threatening and emergent conditions only, so please see a primary care doctor for further evaluation.

## 2017-07-04 NOTE — ED Provider Notes (Signed)
[facility]-EMERGENCY DEPT Provider Note   CSN: [id number] Arrival date & time: 07/03/17  2158     History   Chief Complaint Chief Complaint  Patient presents with  . Loss of Consciousness    HPI Karen [name] is a 42 y.o. female.  HPI 42 year old female with history of COPD, diabetes, hypertension, narcolepsy comes in after a fainting spell.  Patient states that she was at her home and had fainting for a few minutes, that was witnessed by her son.  Patient states that she was feeling dizzy when she got up throughout the day, and had intermittent episodes of chest discomfort.  Patient dizziness is not constant.  Patient denies any shortness of breath, palpitations.  Patient states that she has had narcolepsy related fainting episodes in the past.  Patient denies any history of substance abuse, heavy smoking, emesis or diarrhea.  No premature CAD, or deaths in the family.  History of arrhythmia in the family.  Past Medical History:  Diagnosis Date  . COPD (chronic obstructive pulmonary disease) (HCC)   . Diabetes mellitus without complication (HCC)   . Hypertension   . Thyroid disease     Patient Active Problem List   Diagnosis Date Noted  . Left foot pain 06/21/2017  . Viral URI with cough 04/28/2017  . Hypothyroidism 04/17/2017  . Diabetes mellitus (HCC) 04/17/2017  . Essential hypertension 04/17/2017  . COPD (chronic obstructive pulmonary disease) (HCC) 04/17/2017  . GERD (gastroesophageal reflux disease) 04/17/2017  . 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 Fibromyalgia syndrome 04/17/2017  . 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 btl    . CHOLECYSTECTOMY    . shoulder Bilateral     OB History    No data available       Home Medications    Prior to Admission medications   Medication Sig Start Date End Date Taking? Authorizing Provider  amLODipine (NORVASC) 5 MG tablet Take 1 tablet (5  mg total) by mouth daily. 06/01/17  Yes Deneise Lever, MD  beclomethasone (QVAR) 40 MCG/ACT inhaler Inhale into the lungs 2 (two) times daily.   Yes [provider]  busPIRone (BUSPAR) 10 MG tablet Take 1 tablet (10 mg total) by mouth 2 (two) times daily. 03/15/17  Yes Gerhard Munch, MD  insulin aspart (NOVOLOG FLEXPEN) 100 UNIT/ML FlexPen Inject 10 Units into the skin 3 (three) times daily with meals. 06/02/17  Yes Deneise Lever, MD  Insulin Detemir (LEVEMIR FLEXPEN) 100 UNIT/ML Pen Inject 35 Units into the skin daily at 10 pm. IM program 06/02/17  Yes Deneise Lever, MD  levothyroxine (SYNTHROID, LEVOTHROID) 150 MCG tablet Take 1 tablet (150 mcg total) by mouth daily before breakfast. 06/01/17  Yes Deneise Lever, MD  lisinopril (PRINIVIL,ZESTRIL) 40 MG tablet Take 1 tablet (40 mg total) by mouth daily. 06/01/17  Yes Deneise Lever, MD  lovastatin (MEVACOR) 10 MG tablet Take 1 tablet (10 mg total) by mouth at bedtime. 06/01/17  Yes Deneise Lever, MD  metFORMIN (GLUCOPHAGE) 500 MG tablet Take 2 tablets (1,000 mg total) by mouth 2 (two) times daily with a meal. 06/01/17  Yes Deneise Lever, MD  metoprolol tartrate (LOPRESSOR) 25 MG tablet Take 1 tablet (25 mg total) by mouth 2 (two) times daily. 06/01/17  Yes Deneise Lever, MD  montelukast (SINGULAIR) 10 MG tablet Take 1 tablet (10 mg total) by mouth at bedtime. 06/01/17  Yes Deneise Lever, MD  omeprazole (PRILOSEC) 40 MG capsule  Take 1 capsule (40 mg total) by mouth 2 (two) times daily at 8 am and 10 pm. 06/01/17  Yes Deneise LeverSaraiya, Parth, MD  ranitidine (ZANTAC) 300 MG tablet Take 1 tablet (300 mg total) at bedtime by mouth. 04/17/17  Yes Lanelle BalHarbrecht, Lawrence, MD  sucralfate (CARAFATE) 1 g tablet Take 1 tablet (1 g total) by mouth at bedtime. 06/01/17  Yes Deneise LeverSaraiya, Parth, MD  vitamin E 400 UNIT capsule Take 1 capsule (400 Units total) by mouth daily. 03/15/17  Yes Gerhard MunchLockwood, Robert, MD  albuterol (PROVENTIL HFA;VENTOLIN HFA) 108 (90 Base) MCG/ACT inhaler Inhale  2 puffs into the lungs every 4 (four) hours as needed for wheezing or shortness of breath. 06/01/17   Deneise LeverSaraiya, Parth, MD  DULoxetine (CYMBALTA) 30 MG capsule Take 1 capsule (30 mg total) daily by mouth. Pt takes with 60 mg to equal 90 mg 04/17/17   Lanelle BalHarbrecht, Lawrence, MD  DULoxetine (CYMBALTA) 60 MG capsule Take 1 capsule (60 mg total) daily by mouth. Patient takes with 60 mg to equal 90 mg 04/17/17   Lanelle BalHarbrecht, Lawrence, MD  Insulin Pen Needle (NOVOFINE PLUS) 32G X 4 MM MISC 1 Dose by Does not apply route 4 (four) times daily. Use with Levemir and Novolog, IM program 06/01/17   Deneise LeverSaraiya, Parth, MD  levothyroxine (SYNTHROID, LEVOTHROID) 150 MCG tablet TAKE 1 TABLET DAILY BEFORE BREAKFAST BY MOUTH. 06/09/17   Tyson AliasVincent, Duncan Thomas, MD  loratadine (CLARITIN) 10 MG tablet Take 1 tablet (10 mg total) by mouth daily. Patient not taking: Reported on 04/11/2017 03/15/17   Gerhard MunchLockwood, Robert, MD  methylphenidate (RITALIN) 20 MG tablet Take 1 tablet (20 mg total) by mouth 3 (three) times daily with meals. 03/15/17   Gerhard MunchLockwood, Robert, MD  metoCLOPramide (REGLAN) 5 MG tablet Take 1 tablet (5 mg total) at bedtime by mouth. 04/17/17   Lanelle BalHarbrecht, Lawrence, MD    Family History History reviewed. No pertinent family history.  Social History Social History   Tobacco Use  . Smoking status: Current Every Day Smoker    Packs/day: 0.50    Types: Cigarettes  . Smokeless tobacco: Never Used  . Tobacco comment: 0.5 pack/day cutting back  Substance Use Topics  . Alcohol use: Yes  . Drug use: No     Allergies   Aspirin and Tylenol [acetaminophen]   Review of Systems Review of Systems  All other systems reviewed and are negative.    Physical Exam Updated Vital Signs BP 128/84 (BP Location: Right Arm)   Pulse 91   Temp 97.7 F (36.5 C) (Oral)   Resp 18   Ht 5\' 7"  (1.702 m)   Wt 114.8 kg (253 lb)   SpO2 99%   BMI 39.63 kg/m   Physical Exam  Constitutional: She is oriented to person, place, and  time. She appears well-developed.  HENT:  Head: Normocephalic and atraumatic.  Eyes: EOM are normal.  Neck: Normal range of motion. Neck supple.  Cardiovascular: Normal rate.  Pulmonary/Chest: Effort normal.  Abdominal: Soft. Bowel sounds are normal.  Neurological: She is alert and oriented to person, place, and time. No cranial nerve deficit. Coordination normal.  Skin: Skin is warm and dry.  Nursing note and vitals reviewed.    ED Treatments / Results  Labs (all labs ordered are listed, but only abnormal results are displayed) Labs Reviewed  BASIC METABOLIC PANEL - Abnormal; Notable for the following components:      Result Value   Potassium 3.4 (*)    Glucose, Bld 182 (*)  All other components within normal limits  CBC  URINALYSIS, ROUTINE W REFLEX MICROSCOPIC  CBG MONITORING, ED  I-STAT BETA HCG BLOOD, ED ([facility], [facility], [facility] ONLY)    EKG  EKG Interpretation  Date/Time:  Tuesday July 04 2017 06:29:44 EST Ventricular Rate:  88 PR Interval:    QRS Duration: 96 QT Interval:  366 QTC Calculation: 443 R Axis:   45 Text Interpretation:  Sinus rhythm No acute changes No significant change since last tracing Confirmed by Derwood Kaplan [phone]) on 07/04/2017 6:32:39 AM       Radiology No results found.  Procedures Procedures (including critical care time)  Medications Ordered in ED Medications  ibuprofen (ADVIL,MOTRIN) tablet 600 mg (600 mg Oral Given 07/04/17 0705)     Initial Impression / Assessment and Plan / ED Course  I have reviewed the triage vital signs and the nursing notes.  Pertinent labs & imaging results that were available during my care of the patient were reviewed by me and considered in my medical decision making (see chart for details).     42 year old female comes in after syncopal episode.  Patient does not have any concerning cardiac history, and she has no cardiac type prodrome to the syncope.  Patient has been having orthostatic-like  dizziness over the last 3 days, but she denies any emesis or diarrhea or reduced p.o. intake.  Patient is noted to have sinus rhythm.  Patient is PERC negative.  Plan is to observe patient in the ER for 4 hours.. Patient is to see her PCP in 1 week. Strict ER return precautions have been discussed, and patient is agreeing with the plan and is comfortable with the workup done and the recommendations from the ER.   Final Clinical Impressions(s) / ED Diagnoses   Final diagnoses:  Syncope and collapse    ED Discharge Orders    None       Derwood Kaplan, MD 07/04/17 [id number]

## 2017-07-10 ENCOUNTER — Encounter: Payer: Self-pay | Admitting: Internal Medicine

## 2017-07-10 ENCOUNTER — Ambulatory Visit (INDEPENDENT_AMBULATORY_CARE_PROVIDER_SITE_OTHER): Admitting: Internal Medicine

## 2017-07-10 VITALS — BP 142/80 | HR 90 | Temp 97.8°F | Ht 67.0 in | Wt 268.3 lb

## 2017-07-10 DIAGNOSIS — K219 Gastro-esophageal reflux disease without esophagitis: Secondary | ICD-10-CM | POA: Diagnosis not present

## 2017-07-10 DIAGNOSIS — F310 Bipolar disorder, current episode hypomanic: Secondary | ICD-10-CM | POA: Diagnosis not present

## 2017-07-10 DIAGNOSIS — E039 Hypothyroidism, unspecified: Secondary | ICD-10-CM

## 2017-07-10 DIAGNOSIS — E118 Type 2 diabetes mellitus with unspecified complications: Secondary | ICD-10-CM

## 2017-07-10 DIAGNOSIS — Z79899 Other long term (current) drug therapy: Secondary | ICD-10-CM

## 2017-07-10 DIAGNOSIS — Z794 Long term (current) use of insulin: Secondary | ICD-10-CM

## 2017-07-10 DIAGNOSIS — E119 Type 2 diabetes mellitus without complications: Secondary | ICD-10-CM | POA: Diagnosis not present

## 2017-07-10 DIAGNOSIS — I10 Essential (primary) hypertension: Secondary | ICD-10-CM

## 2017-07-10 DIAGNOSIS — Z23 Encounter for immunization: Secondary | ICD-10-CM | POA: Diagnosis not present

## 2017-07-10 DIAGNOSIS — G47411 Narcolepsy with cataplexy: Secondary | ICD-10-CM | POA: Diagnosis not present

## 2017-07-10 DIAGNOSIS — E1159 Type 2 diabetes mellitus with other circulatory complications: Secondary | ICD-10-CM

## 2017-07-10 DIAGNOSIS — J449 Chronic obstructive pulmonary disease, unspecified: Secondary | ICD-10-CM

## 2017-07-10 LAB — GLUCOSE, CAPILLARY: Glucose-Capillary: 196 mg/dL — ABNORMAL HIGH (ref 65–99)

## 2017-07-10 LAB — POCT GLYCOSYLATED HEMOGLOBIN (HGB A1C): Hemoglobin A1C: 9.2

## 2017-07-10 MED ORDER — FREESTYLE LANCETS MISC
12 refills | Status: DC
Start: 2017-07-10 — End: 2018-07-10

## 2017-07-10 MED ORDER — GLUCOSE BLOOD VI STRP: ORAL_STRIP | 12 refills | Status: DC

## 2017-07-10 MED ORDER — FREESTYLE LITE DEVI: 0 refills | Status: DC

## 2017-07-10 MED ORDER — AMLODIPINE BESYLATE 10 MG PO TABS: 10.0000 mg | ORAL_TABLET | Freq: Every day | ORAL | 2 refills | Status: DC

## 2017-07-10 NOTE — Progress Notes (Addendum)
   CC: follow up of diabetes   HPI:  [name] is a 42 y.o. COPD, GERD, DM, HTN, hypothyroid, manic depression, and narcolepsy who presents for follow up of diabetes, narcolepsy, manic depression. Please see the assessment and plans for the status of the patient chronic medical problems.   Past Medical History:  Diagnosis Date  . COPD (chronic obstructive pulmonary disease) (HCC)   . Diabetes mellitus without complication (HCC)   . Hypertension   . Thyroid disease    Review of Systems:  Refer to history of present illness and assessment and plans for pertinent review of systems, all others reviewed and negative   Physical Exam:  Vitals:   07/10/17 1425  BP: (!) 142/80  Pulse: 90  Temp: 97.8 F (36.6 C)  TempSrc: Oral  SpO2: 95%  Weight: 268 lb 4.8 oz (121.7 kg)  Height: 5\' 7"  (1.702 m)   General: well appearing, no acute distress  Cardiac: RRR, no murmur appreciated, no peripheral edema  Pulm: lungs clear to auscultation, no respiratory distress   Assessment & Plan:   Hypertension  BP Readings from Last 3 Encounters:  07/10/17 (!) 142/80  07/04/17 128/84  06/21/17 (!) 147/89  Blood pressure is not at goal today. Could benefit from increasing amlodipine.  - increase amlodipine to 10 mg daily ( previously 5 mg)  - continue lisinopril 40 mg, and metoprolol 25 mg BID   Diabetes  Last A1c 13.5 03/2017. She feels the symptoms of hypoglycemia at times so doesn't always take novolog with meals. She does not have a meter today, it was lost in the process of a move about 6 months ago. - continue metformin 1000 BID, novolog 10 U TID, levemir 37 units  - A1c today 9.2- this is improved from prior check but not where it should be, will be able to adjust insulin with meter data at follow up visit   - foot exam > Feet are clean and well groomed and without ulcers or lesions - ordered new meter today, start CBG checks with meals and prior to bedtime   ADDENDUM: A1c 9 will  start Jardiance 10 mg daily for renal protective, blood pressure lowering, and weight reduction effects.  Narcolepsy with Cataplexy  Describes episodes of blacking out and collapsing followed by confusion and disorientation. Episodes seem to happen randomly and be brought on by stressful situations. Diagnosed with narcolepsy when she was living in [place] and the syncopal episodes improved with ritalin. Episodes are preceeded by a feeling of dizziness. Denies convulsions, tongue biting, or incontinence.  - orthostatics today - negative  - urgent referral to sleep medicine  - counseled on not driving, swimming, or holding infants until these symptoms have improved   Manic depression  History of Manic depression. Describing symptoms of hypomania at this time - spending sprees but without decreased need for sleep. PHQ 25 today. I will hold off on restarting antidepressants, it may not be safe at this time. Needs to establish with psychiatry.   - Urgent referral to psychiatry    Preventative health  - Flu vaccine today   See Encounters Tab for problem based charting.  Patient discussed with Dr. Rogelia BogaButcher

## 2017-07-10 NOTE — Assessment & Plan Note (Signed)
Describes episodes of blacking out and collapsing followed by confusion and disorientation. Episodes seem to happen randomly and be brought on by stressful situations. Diagnosed with narcolepsy when she was living in [place] and the syncopal episodes improved with ritalin. Episodes are preceeded by a feeling of dizziness. Denies convulsions, tongue biting, or incontinence.  - orthostatics today - negative  - urgent referral to sleep medicine  - counseled on not driving, swimming, or holding infants until these symptoms have improved

## 2017-07-10 NOTE — Assessment & Plan Note (Signed)
Last A1c 13.5 03/2017. She feels the symptoms of hypoglycemia at times so doesn't always take novolog with meals. She does not have a meter today, it was lost in the process of a move about 6 months ago. - continue metfromin 1000 BID, novolog 10 U TID, levemir 37 units  - A1c today 9.2- this is improved from prior check but not where it should be, will be able to adjust insulin with meter data at follow up visit   - foot exam > Feet are clean and well groomed and without ulcers or lesions - ordered new meter today, start CBG checks with meals and prior to bedtime

## 2017-07-10 NOTE — Assessment & Plan Note (Signed)
BP Readings from Last 3 Encounters:  07/10/17 (!) 142/80  07/04/17 128/84  06/21/17 (!) 147/89  Blood pressure is not at goal today. Could benefit from increasing amlodipine.  - increase amlodipine to 10 mg daily ( previously 5 mg)  - continue lisinopril 40 mg, and metoprolol 25 mg BID

## 2017-07-10 NOTE — Addendum Note (Signed)
Addended by: Neomia DearPOWERS, Laray Rivkin E on: 07/10/2017 07:29 PM   Modules accepted: Orders

## 2017-07-10 NOTE — Patient Instructions (Addendum)
It was a pleasure to meet you today [name],   For your blood pressure please start taking amlodipine 10 mg daily ( you were previously taking 5 mg daily ) continue taking quinapril 40 mg daily, and metoprolol 25 mg twice daily. Please try to eat a low salt diet and get some exercise ( walking is a great place to start)   For the narcolepsy I am referring you to sleep medicine, if you have not heard back about scheduling this appointment within the next week please call our office for an update  For the depression, I am referring you to a psychiatrist, if you have not heard back about scheduling this appointment within the next week please call our office for an update  For your diabetes, I have [facility], he is continued to take the metformin 1000 mg twice daily, NovoLog 10 units with meals, and Levemir 35 units at bedtime  FOLLOW-UP INSTRUCTIONS When: end of April with Dr. Obie DredgeBlum   For: Blood pressure check, A1c  What to bring: all of your medication bottles   - Please call our clinic if you have any problems or questions, we may be able to help you and keep you from a long emergency room wait. Our clinic and after hours phone number is [phone]

## 2017-07-10 NOTE — Assessment & Plan Note (Signed)
History of Manic depression. Describing symptoms of hypomania at this time - spending sprees but without decreased need for sleep. PHQ 25 today. I will hold off on restarting antidepressants, it may not be safe at this time. Needs to establish with psychiatry.   - Urgent referral to psychiatry

## 2017-07-11 NOTE — Progress Notes (Signed)
Internal Medicine Clinic Attending  Case discussed with Dr. Blum at the time of the visit.  We reviewed the resident's history and exam and pertinent patient test results.  I agree with the assessment, diagnosis, and plan of care documented in the resident's note. 

## 2017-07-13 ENCOUNTER — Ambulatory Visit (INDEPENDENT_AMBULATORY_CARE_PROVIDER_SITE_OTHER): Admitting: Neurology

## 2017-07-13 ENCOUNTER — Encounter: Payer: Self-pay | Admitting: Neurology

## 2017-07-13 VITALS — BP 148/101 | HR 96 | Ht 67.0 in | Wt 265.0 lb

## 2017-07-13 DIAGNOSIS — Z6841 Body Mass Index (BMI) 40.0 and over, adult: Secondary | ICD-10-CM

## 2017-07-13 DIAGNOSIS — R0683 Snoring: Secondary | ICD-10-CM | POA: Diagnosis not present

## 2017-07-13 DIAGNOSIS — Z820 Family history of epilepsy and other diseases of the nervous system: Secondary | ICD-10-CM | POA: Diagnosis not present

## 2017-07-13 DIAGNOSIS — Z87898 Personal history of other specified conditions: Secondary | ICD-10-CM | POA: Diagnosis not present

## 2017-07-13 DIAGNOSIS — G4710 Hypersomnia, unspecified: Secondary | ICD-10-CM | POA: Diagnosis not present

## 2017-07-13 DIAGNOSIS — R400 Somnolence: Secondary | ICD-10-CM

## 2017-07-13 NOTE — Progress Notes (Signed)
Subjective:    Patient ID: Karen [name] is a 42 y.o. female.  HPI     Huston Foley, MD, PhD [facility] [address], [address] [address] [place], [place] [address]  Dear Dr. Obie Dredge,   I saw your patient, Karen [name], upon your kind request in my neurologic clinic today for initial consultation of her sleep disorder, in particular, her prior diagnosis of narcolepsy. The patient is unaccompanied today. As you know, [name] is a 42 year old right-handed woman with an underlying medical history of mood disorder, suboptimally controlled diabetes, COPD, thyroid disease, smoking, hypertension, and morbid obesity with a BMI of over 40, who reports significant daytime somnolence. She was previously diagnosed with narcolepsy with sleep studies several years ago while she was told [place]. I reviewed your office note from 07/10/2017. Prior sleep study results are not available for my review today. Of note, she is currently not on Ritalin since October 2018. She has been on generic Provigil. She was supposed to start Xyrem, but moved with her husband. She has been married since 10/18. She has not been on BuSpar or Cymbalta for about a month. She smokes half a pack per day or so, she does not drink alcohol on a regular basis. She drinks caffeine about 16 ounce per day in the form of soda.  Her Epworth sleepiness score is 22 out of 24 today, fatigue score is 57 out of 63. She currently does not work. She has 2 children. She has gained weight over the past years, had lost some 80 lb after her BF died. 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 She then stopped drinking and gained about 40 lb in a year. She got married in October.  She has 2 grown sons, 25 and 38. She has 3 GC. She reports a history of sleepiness that goes back to 2015 or maybe 2014. She was having problems at work. 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  She was sleepy at work, she was even late one day to work by an hour and did not realize it. She reports that she had fallen asleep at the wheel with her foot on the brake. She had a 15 minute commute only typically but had fallen asleep on her way while stopped in the middle lane as I understand. She has had some falling spells. She has no triggers for these. She has fallen in the shower and on the stairs. She reports that between 2014 and 2016 she had about 7-8 episodes of falling. She has had lightheadedness and dizzy spells. She has lost consciousness. She has fallen to the ground with the use without recollection and waking up with EMS being called by her husband. She has had some recent stressors including marital discord. She went to the emergency room last week because of the dizzy spell and loss of consciousness. She likely had a syncopal spell. She had blood work and an EKG in the ER. She admits that she does not drink water very much. She reports a history of narcolepsy in her paternal aunt. Her mother has episodes of syncope. She reports that she snores loudly and her husband has taped her snoring. She wakes herself up from her snoring. Her bedtime schedule and rise time routine are currently more erratic. She sleeps better in the early morning hours. She tries to go to bed around 10.  Her Past Medical  History Is Significant For: Past Medical History:  Diagnosis Date  . COPD (chronic obstructive pulmonary disease) (HCC)   . Diabetes mellitus without complication (HCC)   . Hypertension   . Thyroid disease     Her Past Surgical History Is Significant For: Past Surgical History:  Procedure Laterality Date  . btl    . CHOLECYSTECTOMY    . shoulder Bilateral     Her Family History Is Significant For: No family history on file.  Her Social History Is Significant For: Social History   Socioeconomic History  . Marital status: Married    Spouse name: None  . Number of children: None   . Years of education: None  . Highest education level: None  Social Needs  . Financial resource strain: None  . Food insecurity - worry: None  . Food insecurity - inability: None  . Transportation needs - medical: None  . Transportation needs - non-medical: None  Occupational History  . None  Tobacco Use  . Smoking status: Current Every Day Smoker    Packs/day: 0.50    Types: Cigarettes  . Smokeless tobacco: Never Used  . Tobacco comment: 0.5 pack/day cutting back  Substance and Sexual Activity  . Alcohol use: Yes  . Drug use: No  . Sexual activity: None  Other Topics Concern  . None  Social History Narrative  . None    Her Allergies Are:  Allergies  Allergen Reactions  . Aspirin Other (See Comments)    unknown  . Tylenol [Acetaminophen]     Does not take due to fatty liver disease  :   Her Current Medications Are:  Outpatient Encounter Medications as of 07/13/2017  Medication Sig  . albuterol (PROVENTIL HFA;VENTOLIN HFA) 108 (90 Base) MCG/ACT inhaler Inhale 2 puffs into the lungs every 4 (four) hours as needed for wheezing or shortness of breath.  Marland Kitchen amLODipine (NORVASC) 10 MG tablet Take 1 tablet (10 mg total) by mouth daily.  . beclomethasone (QVAR) 40 MCG/ACT inhaler Inhale into the lungs 2 (two) times daily.  . Blood Glucose Monitoring Suppl (FREESTYLE LITE) DEVI To check blood glucose 3 times daily before meals and at bedtime  . glucose blood (FREESTYLE LITE) test strip Use as instructed  . insulin aspart (NOVOLOG FLEXPEN) 100 UNIT/ML FlexPen Inject 10 Units into the skin 3 (three) times daily with meals.  . Insulin Detemir (LEVEMIR FLEXPEN) 100 UNIT/ML Pen Inject 35 Units into the skin daily at 10 pm. IM program  . Insulin Pen Needle (NOVOFINE PLUS) 32G X 4 MM MISC 1 Dose by Does not apply route 4 (four) times daily. Use with Levemir and Novolog, IM program  . Lancets (FREESTYLE) lancets Use as instructed  . levothyroxine (SYNTHROID, LEVOTHROID) 150 MCG tablet  Take 1 tablet (150 mcg total) by mouth daily before breakfast.  . lisinopril (PRINIVIL,ZESTRIL) 40 MG tablet Take 1 tablet (40 mg total) by mouth daily.  Marland Kitchen loratadine (CLARITIN) 10 MG tablet Take 1 tablet (10 mg total) by mouth daily.  Marland Kitchen lovastatin (MEVACOR) 10 MG tablet Take 1 tablet (10 mg total) by mouth at bedtime.  . metFORMIN (GLUCOPHAGE) 500 MG tablet Take 2 tablets (1,000 mg total) by mouth 2 (two) times daily with a meal.  . metoCLOPramide (REGLAN) 5 MG tablet Take 1 tablet (5 mg total) at bedtime by mouth.  . metoprolol tartrate (LOPRESSOR) 25 MG tablet Take 1 tablet (25 mg total) by mouth 2 (two) times daily.  . montelukast (SINGULAIR) 10 MG tablet Take  1 tablet (10 mg total) by mouth at bedtime.  Marland Kitchen omeprazole (PRILOSEC) 40 MG capsule Take 1 capsule (40 mg total) by mouth 2 (two) times daily at 8 am and 10 pm.  . ranitidine (ZANTAC) 300 MG tablet Take 1 tablet (300 mg total) at bedtime by mouth.  . sucralfate (CARAFATE) 1 g tablet Take 1 tablet (1 g total) by mouth at bedtime.  . vitamin E 400 UNIT capsule Take 1 capsule (400 Units total) by mouth daily.  . busPIRone (BUSPAR) 10 MG tablet Take 1 tablet (10 mg total) by mouth 2 (two) times daily. (Patient not taking: Reported on 07/13/2017)  . DULoxetine (CYMBALTA) 30 MG capsule Take 1 capsule (30 mg total) daily by mouth. Pt takes with 60 mg to equal 90 mg (Patient not taking: Reported on 07/13/2017)  . DULoxetine (CYMBALTA) 60 MG capsule Take 1 capsule (60 mg total) daily by mouth. Patient takes with 60 mg to equal 90 mg (Patient not taking: Reported on 07/13/2017)  . methylphenidate (RITALIN) 20 MG tablet Take 1 tablet (20 mg total) by mouth 3 (three) times daily with meals. (Patient not taking: Reported on 07/13/2017)   No facility-administered encounter medications on file as of 07/13/2017.   :  Review of Systems:  Out of a complete 14 point review of systems, all are reviewed and negative with the exception of these symptoms as listed  below:   Review of Systems  Neurological:       Pt presents today to discuss her sleep. 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 Pt has not been taking ritalin, cymbalta, or buspar for several months.  Epworth Sleepiness Scale 0= would never doze 1= slight chance of dozing 2= moderate chance of dozing 3= high chance of dozing  Sitting and reading: 3 Watching TV: 3 Sitting inactive in a public place (ex. Theater or meeting): 3 As a passenger in a car for an hour without a break: 3 Lying down to rest in the afternoon: 3 Sitting and talking to someone: 2 Sitting quietly after lunch (no alcohol): 3 In a car, while stopped in traffic: 2 Total: 22    Objective:  Neurological Exam  Physical Exam Physical Examination:   Vitals:   07/13/17 1418  BP: (!) 148/101  Pulse: 96    General Examination: The patient is a very pleasant 42 y.o. female in no acute distress. She appears well-developed and well-nourished and well groomed. She is tearful.   HEENT: Normocephalic, atraumatic, pupils are equal, round and reactive to light and accommodation. Extraocular tracking is good without limitation to gaze excursion or nystagmus noted. Normal smooth pursuit is noted. Hearing is grossly intact. Face is symmetric with normal facial animation and normal facial sensation. Speech is clear with no dysarthria noted. There is no hypophonia. There is no lip, neck/head, jaw or voice tremor. Neck is supple with full range of passive and active motion. There are no carotid bruits on auscultation. Oropharynx exam reveals: moderate mouth dryness, adequate dental hygiene and moderate airway crowding, due to large uvula and tonsils are 3+ bilaterally, wider tongue. Mallampati is class II. Neck circumference is 17 inches.  Chest: Clear to auscultation without wheezing, rhonchi or crackles noted.  Heart: S1+S2+0, regular and normal without murmurs, rubs or gallops noted.   Abdomen: Soft,  non-tender and non-distended with normal bowel sounds appreciated on auscultation.  Extremities: There is no pitting edema in the distal lower extremities bilaterally. Pedal pulses are intact.  Skin:  Warm and dry without trophic changes noted.  Musculoskeletal: exam reveals no obvious joint deformities, tenderness or joint swelling or erythema.   Neurologically:  Mental status: The patient is awake, alert and oriented in all 4 spheres. Her immediate and remote memory, attention, language skills and fund of knowledge are appropriate. There is no evidence of aphasia, agnosia, apraxia or anomia. Speech is clear with normal prosody and enunciation. Thought process is linear. Mood is sad and affect is blunted and she is tearful.  Cranial nerves II - XII are as described above under HEENT exam. In addition: shoulder shrug is normal with equal shoulder height noted. Motor exam: Normal bulk, strength and tone is noted. There is no drift, tremor or rebound. Romberg is negative. Reflexes are 2+ throughout. Fine motor skills and coordination: intact with normal finger taps, normal hand movements, normal rapid alternating patting, normal foot taps and normal foot agility.  Cerebellar testing: No dysmetria or intention tremor on finger to nose testing. Heel to shin is unremarkable bilaterally. There is no truncal or gait ataxia.  Sensory exam: intact to light touch in the upper and lower extremities.  Gait, station and balance: She stands easily. No veering to one side is noted. No leaning to one side is noted. Posture is age-appropriate and stance is narrow based. Gait shows normal stride length and normal pace. No problems turning are noted. Tandem walk is unremarkable.              Assessment and Plan:   In summary, Valera CastleKeisha Mcnee is a very pleasant 42 y.o.-year old female with an underlying medical history of mood disorder, suboptimally controlled diabetes, COPD, thyroid disease, smoking, hypertension, and  morbid obesity with a BMI of over 40, who patient sleep consultation. She reportedly prior diagnosis of narcolepsy with cataplexy. Her history and exam are concerning for underlying obstructive sleep apnea and she does endorse significant snoring at this time and weight gain in the past year or so. Her history is not telltale for cataplexy, nevertheless, I suggested we proceed with extended sleep study testing in the form of nocturnal polysomnogram, followed by a daytime nap study.  I Explained to the patient the diagnosis of obstructive sleep apnea and the treatment options. She would be open to sleep study testing. She indicates that she is not on Ritalin and in the past month she has not been on her Cymbalta or BuSpar. She is endorsing quite a bit of stress and mood related issues. It sounds like she has been referred to psychiatry for this. She is advised that she will have to be off of all stimulants and antidepressants agents as well as antianxiety medication in preparation for sleep study testing and be off of these medications for at least 2 weeks prior to testing. She understands this and is agreeable. I will see her back after her tests are completed. She is advised to consider treatment with a CPAP machine should she have a diagnosis of OSA. I advised the patient not to drive when feeling sleepy. I recommended the following at this time: sleep study overnight, with next day MSLT.    Thank you very much for allowing me to participate in the care of this nice patient. If I can be of any further assistance to you please do not hesitate to call me at [phone].  Sincerely,   Huston FoleySaima Madolyn Ackroyd, MD, PhD

## 2017-07-13 NOTE — Telephone Encounter (Signed)
error    This encounter was created in error - please disregard.

## 2017-07-13 NOTE — Patient Instructions (Addendum)
  Thank you for choosing [facility] for your sleep related care! It was nice to meet you today! I appreciate that you entrust me with your sleep related healthcare concerns. I hope, I was able to address at least some of your concerns today, and that I can help you feel reassured and also get better.    Here is what we discussed today and what we came up with as our plan for you: We will look into your severe sleepiness and your prior diagnosis of narcolepsywith a nighttime sleep study, followed by a daytime nap study. In preparation for sleep study testing, you will have to stay off of all and any antidepressants, stimulants and anxiety medication. Please do not increase your soda or coffee intake in the interim. You have to be off of these meds for at least 2 weeks prior to sleep study testing.  You may be at risk for obstructive sleep apnea secondary to your snoring reported and weight gain. If you have sleep apnea we will have to treat her sleep apnea first with most likely a CPAP machine.  Do no drive when sleepy! Please keep your sleep schedule stable and do not add any additional medications or caffeine in your day to day routine, in preparation of the study. Do not take Any sleep aids even over-the-counter sleep aids prior to sleep study testing as this can interfere with the results. Our sleep lab administrative assistant will call you to schedule your sleep studies. If you don't hear back from her by about 2 weeks from now, please feel free to call her at [phone]. You can leave a message with your phone number and concerns, if you get the voicemail box. She will call back as soon as possible.

## 2017-07-16 NOTE — Addendum Note (Signed)
Addended by: Earl LagosBLUM, Javar Eshbach S on: 07/16/2017 03:01 AM   Modules accepted: Orders

## 2017-07-18 MED ORDER — EMPAGLIFLOZIN 10 MG PO TABS: 10.0000 mg | ORAL_TABLET | Freq: Every day | ORAL | 2 refills | Status: DC

## 2017-07-18 NOTE — Addendum Note (Signed)
Addended by: Earl LagosBLUM, Gionna Polak S on: 07/18/2017 03:19 PM   Modules accepted: Orders

## 2017-08-02 ENCOUNTER — Other Ambulatory Visit: Payer: Self-pay

## 2017-08-02 ENCOUNTER — Encounter: Payer: Self-pay | Admitting: Internal Medicine

## 2017-08-02 ENCOUNTER — Emergency Department (HOSPITAL_COMMUNITY)
Admission: EM | Admit: 2017-08-02 | Discharge: 2017-08-02 | Disposition: A | Discharge status: Home / Self Care | Attending: Emergency Medicine | Admitting: Emergency Medicine

## 2017-08-02 ENCOUNTER — Ambulatory Visit (INDEPENDENT_AMBULATORY_CARE_PROVIDER_SITE_OTHER): Admitting: Internal Medicine

## 2017-08-02 ENCOUNTER — Emergency Department (HOSPITAL_COMMUNITY)

## 2017-08-02 ENCOUNTER — Encounter (HOSPITAL_COMMUNITY): Payer: Self-pay

## 2017-08-02 VITALS — BP 140/78 | HR 98 | Temp 98.3°F | Wt 268.6 lb

## 2017-08-02 DIAGNOSIS — R1084 Generalized abdominal pain: Secondary | ICD-10-CM | POA: Diagnosis not present

## 2017-08-02 DIAGNOSIS — K3184 Gastroparesis: Secondary | ICD-10-CM | POA: Diagnosis not present

## 2017-08-02 DIAGNOSIS — E039 Hypothyroidism, unspecified: Secondary | ICD-10-CM | POA: Insufficient documentation

## 2017-08-02 DIAGNOSIS — F17210 Nicotine dependence, cigarettes, uncomplicated: Secondary | ICD-10-CM | POA: Insufficient documentation

## 2017-08-02 DIAGNOSIS — I10 Essential (primary) hypertension: Secondary | ICD-10-CM

## 2017-08-02 DIAGNOSIS — I208 Other forms of angina pectoris: Secondary | ICD-10-CM | POA: Insufficient documentation

## 2017-08-02 DIAGNOSIS — J449 Chronic obstructive pulmonary disease, unspecified: Secondary | ICD-10-CM | POA: Diagnosis not present

## 2017-08-02 DIAGNOSIS — E119 Type 2 diabetes mellitus without complications: Secondary | ICD-10-CM | POA: Diagnosis not present

## 2017-08-02 DIAGNOSIS — Z794 Long term (current) use of insulin: Secondary | ICD-10-CM | POA: Insufficient documentation

## 2017-08-02 DIAGNOSIS — K219 Gastro-esophageal reflux disease without esophagitis: Secondary | ICD-10-CM

## 2017-08-02 DIAGNOSIS — R079 Chest pain, unspecified: Secondary | ICD-10-CM | POA: Diagnosis not present

## 2017-08-02 DIAGNOSIS — R1013 Epigastric pain: Secondary | ICD-10-CM | POA: Diagnosis not present

## 2017-08-02 DIAGNOSIS — Z6841 Body Mass Index (BMI) 40.0 and over, adult: Secondary | ICD-10-CM

## 2017-08-02 DIAGNOSIS — Z79899 Other long term (current) drug therapy: Secondary | ICD-10-CM | POA: Diagnosis not present

## 2017-08-02 DIAGNOSIS — E1143 Type 2 diabetes mellitus with diabetic autonomic (poly)neuropathy: Secondary | ICD-10-CM | POA: Diagnosis not present

## 2017-08-02 DIAGNOSIS — Z8719 Personal history of other diseases of the digestive system: Secondary | ICD-10-CM | POA: Diagnosis not present

## 2017-08-02 DIAGNOSIS — E669 Obesity, unspecified: Secondary | ICD-10-CM

## 2017-08-02 LAB — CBC
HCT: 40.7 % (ref 36.0–46.0)
Hemoglobin: 13.5 g/dL (ref 12.0–15.0)
MCH: 28.3 pg (ref 26.0–34.0)
MCHC: 33.2 g/dL (ref 30.0–36.0)
MCV: 85.3 fL (ref 78.0–100.0)
PLATELETS: 241 10*3/uL (ref 150–400)
RBC: 4.77 MIL/uL (ref 3.87–5.11)
RDW: 13.1 % (ref 11.5–15.5)
WBC: 9.4 10*3/uL (ref 4.0–10.5)

## 2017-08-02 LAB — BASIC METABOLIC PANEL
ANION GAP: 11 (ref 5–15)
BUN: 11 mg/dL (ref 6–20)
CALCIUM: 8.6 mg/dL — AB (ref 8.9–10.3)
CO2: 24 mmol/L (ref 22–32)
Chloride: 101 mmol/L (ref 101–111)
Creatinine, Ser: 0.71 mg/dL (ref 0.44–1.00)
GFR calc Af Amer: >60 mL/min (ref >60)
GLUCOSE: 254 mg/dL — AB (ref 65–99)
Potassium: 3.5 mmol/L (ref 3.5–5.1)
Sodium: 136 mmol/L (ref 135–145)

## 2017-08-02 LAB — HEPATIC FUNCTION PANEL
ALBUMIN: 3.4 g/dL — AB (ref 3.5–5.0)
ALK PHOS: 80 U/L (ref 38–126)
ALT: 24 U/L (ref 14–54)
AST: 34 U/L (ref 15–41)
Bilirubin, Direct: <0.1 mg/dL — ABNORMAL LOW (ref 0.1–0.5)
TOTAL PROTEIN: 6.6 g/dL (ref 6.5–8.1)
Total Bilirubin: 0.9 mg/dL (ref 0.3–1.2)

## 2017-08-02 LAB — URINALYSIS, ROUTINE W REFLEX MICROSCOPIC
BILIRUBIN URINE: NEGATIVE
Glucose, UA: >=500 mg/dL — AB
HGB URINE DIPSTICK: NEGATIVE
KETONES UR: 5 mg/dL — AB
LEUKOCYTES UA: NEGATIVE
NITRITE: NEGATIVE
Protein, ur: NEGATIVE mg/dL
SPECIFIC GRAVITY, URINE: 1.04 — AB (ref 1.005–1.030)
pH: 5 (ref 5.0–8.0)

## 2017-08-02 LAB — I-STAT TROPONIN, ED
TROPONIN I, POC: 0 ng/mL (ref 0.00–0.08)
Troponin i, poc: 0.02 ng/mL (ref 0.00–0.08)

## 2017-08-02 LAB — LIPASE, BLOOD: LIPASE: 30 U/L (ref 11–51)

## 2017-08-02 LAB — I-STAT BETA HCG BLOOD, ED (MC, WL, AP ONLY): I-stat hCG, quantitative: <5 m[IU]/mL (ref <5)

## 2017-08-02 LAB — GLUCOSE, CAPILLARY: GLUCOSE-CAPILLARY: 236 mg/dL — AB (ref 65–99)

## 2017-08-02 LAB — D-DIMER, QUANTITATIVE (NOT AT ARMC): D DIMER QUANT: 0.4 ug{FEU}/mL (ref 0.00–0.50)

## 2017-08-02 MED ORDER — DICYCLOMINE HCL 10 MG/ML IM SOLN
20.0000 mg | Freq: Once | INTRAMUSCULAR | Status: AC
  Administered 2017-08-02: 20 mg via INTRAMUSCULAR
  Filled 2017-08-02: qty 2

## 2017-08-02 MED ORDER — ASPIRIN 81 MG PO CHEW
324.0000 mg | CHEWABLE_TABLET | Freq: Once | ORAL | Status: AC
  Administered 2017-08-02: 324 mg via ORAL
  Filled 2017-08-02: qty 4

## 2017-08-02 MED ORDER — GI COCKTAIL ~~LOC~~
30.0000 mL | Freq: Once | ORAL | Status: AC
  Administered 2017-08-02: 30 mL via ORAL
  Filled 2017-08-02: qty 30

## 2017-08-02 MED ORDER — METOCLOPRAMIDE HCL 5 MG/ML IJ SOLN
10.0000 mg | Freq: Once | INTRAMUSCULAR | Status: AC
  Administered 2017-08-02: 10 mg via INTRAVENOUS
  Filled 2017-08-02: qty 2

## 2017-08-02 MED ORDER — DICYCLOMINE HCL 20 MG PO TABS: 20.0000 mg | ORAL_TABLET | Freq: Two times a day (BID) | ORAL | 0 refills | Status: DC

## 2017-08-02 MED ORDER — DIPHENHYDRAMINE HCL 50 MG/ML IJ SOLN
25.0000 mg | Freq: Once | INTRAMUSCULAR | Status: AC
  Administered 2017-08-02: 25 mg via INTRAVENOUS
  Filled 2017-08-02: qty 1

## 2017-08-02 MED ORDER — SUCRALFATE 1 G PO TABS: 1.0000 g | ORAL_TABLET | Freq: Three times a day (TID) | ORAL | 0 refills | Status: DC

## 2017-08-02 NOTE — Assessment & Plan Note (Signed)
Patinet experiencing 7-8/10 intermitted dull, pulling chest pain with typical features of worsening with activity and relief with rest. This pain is concerning for possible cardiac etiology in this 42 yo patient with a history of Poorly controlled DM, HTN, and Obesity. - Patient to be sent to the Emergency department via wheelchair for further chest pain evaluation.

## 2017-08-02 NOTE — Progress Notes (Signed)
   CC: Abdominal pain, Chest Pain  HPI:  [name] is a 42 y.o. F with PMHx listed below presenting for Abdominal pain, Chest Pain. Please see the A&P for the status of the patient's chronic medical problems.  Patient states that she has been experiencing 2 days of abdominal pain and intermittent chest pain. Her abdominal pain is described a sharp 8/10 constant epigastric pain. She was initially experiencing gas and dismissed the pain, but it then worsened to her current pain. She endorses 1 episode of vomiting yesterday, which contained the contents of her lunch; without any blood or bile noted. She states that there was no change in the pain when she ate her lunch. No change in pain with position. She states that she has a history of prior pancreatitis and gastroparesis, but these were diagnosed in [place] and we do not have all of her records. She denies fever, or changes in her bowl habits (she has some chronic contibation).  Her chest pain is described as a 7-8/10 dull, pulling pain, that worsens with activity and is relieved with rest. She has some tenderness to palpation on exam but states the tenderness is different and not as severe as her intermittent chest pain. She report some chronic shortness of breath attributed to her COPD.    Past Medical History:  Diagnosis Date  . COPD (chronic obstructive pulmonary disease) (HCC)   . Diabetes mellitus without complication (HCC)   . Hypertension   . Thyroid disease    Review of Systems: Performed and all others negative.  Physical Exam:  Vitals:   08/02/17 1039  BP: 140/78  Pulse: 98  Temp: 98.3 F (36.8 C)  TempSrc: Oral  SpO2: 99%  Weight: 268 lb 9.6 oz (121.8 kg)   Physical Exam  Constitutional: She appears well-developed and well-nourished. She appears distressed.  Obese Female  Cardiovascular: Normal rate, regular rhythm, normal heart sounds and intact distal pulses.  Pulmonary/Chest: Effort normal and breath  sounds normal. No respiratory distress.  Abdominal: Soft. Bowel sounds are normal. There is tenderness.  Patient tender to palpation at all 4 quadrants and reports the pain is largely referred to her epigastrium Tender to palpation of epigastrum    Assessment & Plan:   See Encounters Tab for problem based charting.  Abdominal Pain Patient presenting with 8/10 constant epigastric pain as above with a history of pancreatitis, gastroparesis, and GERD. She has been taking her medication for GERD and the pain is not associated with eating or position. She additionally has been taking her Reglan for gastroparesis. She reports previous pancreatitis when she was in [place], which this may be a recurrence of. Patient will need laboratory work up to include lipase, CMP, CBC. She is being sent to the Emergency department today due to her chest pain with typical features and will have her workup for abdominal pain performed in the ED as well.  Chest Pain Patinet experiencing 7-8/10 intermitted dull, pulling chest pain with typical features of worsening with activity and relief with rest. This pain is concerning for possible cardiac etiology in this 42 yo patient with a history of Poorly controlled DM, HTN, and Obesity. Patient to be sent to the Emergency department via wheelchair for further chest pain evaluation.  Patient discussed with Dr. Heide SparkNarendra

## 2017-08-02 NOTE — Assessment & Plan Note (Signed)
Patient states that she has been experiencing 2 days of abdominal pain and intermittent chest pain. Her abdominal pain is described a sharp 8/10 constant epigastric pain. She was initially experiencing gas and dismissed the pain, but it then worsened to her current pain. She endorses 1 episode of vomiting yesterday, which contained the contents of her lunch; without any blood or bile noted. She states that there was no change in the pain when she ate her lunch. No change in pain with position. She states that she has a history of prior pancreatitis and gastroparesis, but these were diagnosed in [place] and we do not have all of her records. She denies fever, or changes in her bowl habits (she has some chronic contibation).  Patient presenting with 8/10 constant epigastric pain as above with a history of pancreatitis, gastroparesis, and GERD. She has been taking her medication for GERD and the pain is not associated with eating or position. She additionally has been taking her Reglan for gastroparesis. She reports previous pancreatitis when she was in [place], which this may be a recurrence of. - She is being sent to the Emergency department today due to her chest pain with typical features and will have her workup for abdominal pain performed in the ED as well. - Patient will need laboratory work up to include lipase, CMP, CBC.

## 2017-08-02 NOTE — ED Triage Notes (Signed)
Pt reports intermittent chest pain that began last night. PT states pain is in left side of chest with no radiation and comes when she gets up to do things around the house (sweel, laundry, get food). Pt endorses sob at all times.

## 2017-08-02 NOTE — Discharge Instructions (Signed)
Please read and follow all provided instructions.  Your diagnoses today include:  1. Left sided chest pain   2. Epigastric pain     Tests performed today include: An EKG of your heart A chest x-ray Cardiac enzymes - a blood test for heart muscle damage Blood counts and electrolytes Vital signs. See below for your results today.   Medications prescribed:   Take any prescribed medications only as directed.  Follow-up instructions: Please follow-up with your primary care provider as soon as you can for further evaluation of your symptoms.   Return instructions:  SEEK IMMEDIATE MEDICAL ATTENTION IF: You have severe chest pain, especially if the pain is crushing or pressure-like and spreads to the arms, back, neck, or jaw, or if you have sweating, nausea (feeling sick to your stomach), or shortness of breath. THIS IS AN EMERGENCY. Don't wait to see if the pain will go away. Get medical help at once. Call 911 or 0 (operator). DO NOT drive yourself to the hospital.  Your chest pain gets worse and does not go away with rest.  You have an attack of chest pain lasting longer than usual, despite rest and treatment with the medications your caregiver has prescribed.  You wake from sleep with chest pain or shortness of breath. You feel dizzy or faint. You have chest pain not typical of your usual pain for which you originally saw your caregiver.  You have any other emergent concerns regarding your health.  Additional Information: Chest pain comes from many different causes. Your caregiver has diagnosed you as having chest pain that is not specific for one problem, but does not require admission.  You are at low risk for an acute heart condition or other serious illness.   Your vital signs today were: BP 122/72    Pulse 96    Temp 98.7 F (37.1 C) (Oral)    Resp (!) 23    Ht 5\' 7"  (1.702 m)    Wt 121.6 kg (268 lb)    LMP 07/09/2017    SpO2 96%    BMI 41.97 kg/m  If your blood pressure (BP)  was elevated above 135/85 this visit, please have this repeated by your doctor within one month. --------------

## 2017-08-02 NOTE — ED Provider Notes (Signed)
[facility] EMERGENCY DEPARTMENT Provider Note   CSN: [id number] Arrival date & time: 08/02/17  1133     History   Chief Complaint Chief Complaint  Patient presents with  . Chest Pain    HPI Karen [name] is a 42 y.o. female.  HPI   Chest Pain:   11 YOF arrives at the emergency department complaining of chest pain. The pain began 48 hours ago during It is described as left-sided and dull in nature. It is located in the left chest region with mild radiation into the posterior thorax but not down the left arm or up the jaw.  Patient also began having epigastric abdominal pain since she developed the chest pain, and reported that she became nauseous and threw up once last night, nonbloody and nonbilious.  The pain is intermittent, made worse by movement and activity, such as doing household chores and relieved with rest. Pt denies diaphoresis, syncope, palpitations, fever, peripheral edema, abdominal pain, cough, or hemoptysis; endorses shortness of breath, but she reports always being short of breath due to a COPD diagnosis, however it is not changed over the past 48 hours.  Patient reports moving from [place] within the year, and she had cardiac workups back there, but none in [place].  Patient reports history of stress test that was normal.  Patient reports she has a history of left-sided chest pain, however she has been previously exposed to bronchitis, and this feels different.  Patient denies unilateral lower extremity edema, estrogen use, history of cancer, hemoptysis, personal or family history of DVT/PE, recent immobilization, hospitalization or surgery.  Abdominal Pain:   Patient reports abdominal pain began approximately 3 days ago.  Abdominal surgical history signficant for cholecystectomy and tubal ligation.  She reports that the pain is epigastric in a bandlike nature across her abdomen.  Patient is pretty has a history of pancreatitis, and due to the  severity of the pain was where she was developing that last night.  Patient reports pain is sharp in the 7-8 out of 10 in severity.  Pain is constant.  Associated nausea with only one episode of emesis.  Patient last took her Reglan last night, but vomited afterwards.  Patient reports she does have a history of peptic ulcer disease, but denies regular NSAID use or alcohol use.  Pain is worse with eating, however pain is present without eating as well.  Past Medical History:  Diagnosis Date  . COPD (chronic obstructive pulmonary disease) (HCC)   . Diabetes mellitus without complication (HCC)   . Hypertension   . Thyroid disease     Patient Active Problem List   Diagnosis Date Noted  . Generalized abdominal pain 08/02/2017  . Chest pain 08/02/2017  . Hypothyroidism 04/17/2017  . Diabetes mellitus (HCC) 04/17/2017  . Hypertension associated with diabetes (HCC) 04/17/2017  . COPD (chronic obstructive pulmonary disease) (HCC) 04/17/2017  . GERD (gastroesophageal reflux disease) 04/17/2017  . 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 Fibromyalgia syndrome 04/17/2017  . 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 btl    . CHOLECYSTECTOMY    . shoulder Bilateral     OB History    No data available       Home Medications    Prior to Admission medications   Medication Sig Start Date End Date Taking? Authorizing Provider  albuterol (PROVENTIL HFA;VENTOLIN HFA) 108 (90 Base) MCG/ACT inhaler Inhale 2 puffs into the lungs every 4 (  four) hours as needed for wheezing or shortness of breath. 06/01/17   Deneise Lever, MD  amLODipine (NORVASC) 10 MG tablet Take 1 tablet (10 mg total) by mouth daily. 07/10/17   Eulah Pont, MD  beclomethasone (QVAR) 40 MCG/ACT inhaler Inhale into the lungs 2 (two) times daily.    [provider]  Blood Glucose Monitoring Suppl (FREESTYLE LITE) DEVI To check blood glucose 3 times daily before meals and at  bedtime 07/10/17   Eulah Pont, MD  busPIRone (BUSPAR) 10 MG tablet Take 1 tablet (10 mg total) by mouth 2 (two) times daily. Patient not taking: Reported on 07/13/2017 03/15/17   Gerhard Munch, MD  DULoxetine (CYMBALTA) 30 MG capsule Take 1 capsule (30 mg total) daily by mouth. Pt takes with 60 mg to equal 90 mg Patient not taking: Reported on 07/13/2017 04/17/17   Lanelle Bal, MD  DULoxetine (CYMBALTA) 60 MG capsule Take 1 capsule (60 mg total) daily by mouth. Patient takes with 60 mg to equal 90 mg Patient not taking: Reported on 07/13/2017 04/17/17   Lanelle Bal, MD  empagliflozin (JARDIANCE) 10 MG TABS tablet Take 10 mg by mouth daily. 07/18/17   Eulah Pont, MD  glucose blood (FREESTYLE LITE) test strip Use as instructed 07/10/17   Eulah Pont, MD  insulin aspart (NOVOLOG FLEXPEN) 100 UNIT/ML FlexPen Inject 10 Units into the skin 3 (three) times daily with meals. 06/02/17   Deneise Lever, MD  Insulin Detemir (LEVEMIR FLEXPEN) 100 UNIT/ML Pen Inject 35 Units into the skin daily at 10 pm. IM program 06/02/17   Deneise Lever, MD  Insulin Pen Needle (NOVOFINE PLUS) 32G X 4 MM MISC 1 Dose by Does not apply route 4 (four) times daily. Use with Levemir and Novolog, IM program 06/01/17   Deneise Lever, MD  Lancets (FREESTYLE) lancets Use as instructed 07/10/17   Eulah Pont, MD  levothyroxine (SYNTHROID, LEVOTHROID) 150 MCG tablet Take 1 tablet (150 mcg total) by mouth daily before breakfast. 06/01/17   Deneise Lever, MD  lisinopril (PRINIVIL,ZESTRIL) 40 MG tablet Take 1 tablet (40 mg total) by mouth daily. 06/01/17   Deneise Lever, MD  loratadine (CLARITIN) 10 MG tablet Take 1 tablet (10 mg total) by mouth daily. 03/15/17   Gerhard Munch, MD  lovastatin (MEVACOR) 10 MG tablet Take 1 tablet (10 mg total) by mouth at bedtime. 06/01/17   Deneise Lever, MD  metFORMIN (GLUCOPHAGE) 500 MG tablet Take 2 tablets (1,000 mg total) by mouth 2 (two) times daily with a meal. 06/01/17   Deneise Lever, MD    methylphenidate (RITALIN) 20 MG tablet Take 1 tablet (20 mg total) by mouth 3 (three) times daily with meals. Patient not taking: Reported on 07/13/2017 03/15/17   Gerhard Munch, MD  metoCLOPramide (REGLAN) 5 MG tablet Take 1 tablet (5 mg total) at bedtime by mouth. 04/17/17   Lanelle Bal, MD  metoprolol tartrate (LOPRESSOR) 25 MG tablet Take 1 tablet (25 mg total) by mouth 2 (two) times daily. 06/01/17   Deneise Lever, MD  montelukast (SINGULAIR) 10 MG tablet Take 1 tablet (10 mg total) by mouth at bedtime. 06/01/17   Deneise Lever, MD  omeprazole (PRILOSEC) 40 MG capsule Take 1 capsule (40 mg total) by mouth 2 (two) times daily at 8 am and 10 pm. 06/01/17   Deneise Lever, MD  ranitidine (ZANTAC) 300 MG tablet Take 1 tablet (300 mg total) at bedtime by mouth. 04/17/17   Lanelle Bal, MD  sucralfate (CARAFATE) 1 g tablet Take 1 tablet (  1 g total) by mouth at bedtime. 06/01/17   Deneise LeverSaraiya, Parth, MD  vitamin E 400 UNIT capsule Take 1 capsule (400 Units total) by mouth daily. 03/15/17   Gerhard MunchLockwood, Robert, MD    Family History No family history on file.  Social History Social History   Tobacco Use  . Smoking status: Current Every Day Smoker    Packs/day: 0.50    Types: Cigarettes  . Smokeless tobacco: Never Used  . Tobacco comment: 0.5 pack/day cutting back  Substance Use Topics  . Alcohol use: Yes  . Drug use: No     Allergies   Aspirin and Tylenol [acetaminophen]   Review of Systems Review of Systems  Constitutional: Negative for chills and fever.  HENT: Negative for congestion, rhinorrhea, sinus pain and sore throat.   Eyes: Negative for visual disturbance.  Respiratory: Positive for cough and chest tightness.   Cardiovascular: Positive for chest pain and palpitations. Negative for leg swelling.  Gastrointestinal: Positive for abdominal pain, constipation, nausea and vomiting. Negative for diarrhea.  Genitourinary: Negative for dysuria and flank pain.   Musculoskeletal: Positive for back pain. Negative for myalgias.  Skin: Negative for rash.  Neurological: Negative for dizziness, syncope, weakness, light-headedness and headaches.     Physical Exam Updated Vital Signs BP 128/82 (BP Location: Right Arm)   Pulse (!) 101   Temp 98.7 F (37.1 C) (Oral)   Resp 18   Ht 5\' 7"  (1.702 m)   Wt 121.6 kg (268 lb)   LMP 07/09/2017   SpO2 98%   BMI 41.97 kg/m   Physical Exam  Constitutional: She appears well-developed and well-nourished. No distress.  Obese female lying comfortably in bed  HENT:  Head: Normocephalic and atraumatic.  Mouth/Throat: Oropharynx is clear and moist.  Eyes: Conjunctivae and EOM are normal. Pupils are equal, round, and reactive to light.  Neck: Normal range of motion. Neck supple.  Cardiovascular: Normal rate, regular rhythm, S1 normal and S2 normal.  No murmur heard. Pulses:      Radial pulses are 2+ on the right side, and 2+ on the left side.  Chest pain not reproducible on palpation.  Pulmonary/Chest: Effort normal and breath sounds normal. She has no wheezes. She has no rales.  Abdominal: Soft. Bowel sounds are normal. She exhibits no distension. There is tenderness. There is no rebound and no guarding.  Tenderness to palpation of the epigastrium without rebound or guarding.  Liver edge nonpalpable.  No right lower quadrant or left lower quadrant, or suprapubic tenderness.  Musculoskeletal: Normal range of motion. She exhibits no edema or deformity.  Lymphadenopathy:    She has no cervical adenopathy.  Neurological: She is alert.  Cranial nerves grossly intact. Patient moves extremities symmetrically and with good coordination.  Skin: Skin is warm and dry. No rash noted. No erythema.  Psychiatric: She has a normal mood and affect. Her behavior is normal. Judgment and thought content normal.  Nursing note and vitals reviewed.    ED Treatments / Results  Labs (all labs ordered are listed, but only  abnormal results are displayed) Labs Reviewed  BASIC METABOLIC PANEL - Abnormal; Notable for the following components:      Result Value   Glucose, Bld 254 (*)    Calcium 8.6 (*)    All other components within normal limits  CBC  LIPASE, BLOOD  HEPATIC FUNCTION PANEL  D-DIMER, QUANTITATIVE (NOT AT [facility])  I-STAT TROPONIN, ED  I-STAT BETA HCG BLOOD, ED ([facility], [facility], [facility] ONLY)  I-STAT TROPONIN, ED    EKG  EKG Interpretation  Date/Time:  Wednesday August 02 2017 11:36:38 EST Ventricular Rate:  100 PR Interval:  146 QRS Duration: 74 QT Interval:  354 QTC Calculation: 456 R Axis:   29 Text Interpretation:  Normal sinus rhythm Low voltage QRS Nonspecific T wave abnormality Abnormal ECG Confirmed by Linwood Dibbles [phone]), editor Barbette Hair [id number]) on 08/02/2017 3:22:44 PM       Radiology Dg Chest 2 View  Result Date: 08/02/2017 CLINICAL DATA:  Chest pain that began last night EXAM: CHEST - 2 VIEW COMPARISON:  None. FINDINGS: The heart size and mediastinal contours are within normal limits. Both lungs are clear. The visualized skeletal structures are unremarkable. IMPRESSION: No active cardiopulmonary disease. Electronically Signed   By: Elige Ko   On: 08/02/2017 13:19    Procedures Procedures (including critical care time)  Medications Ordered in ED Medications  gi cocktail (Maalox,Lidocaine,Donnatal) (not administered)  dicyclomine (BENTYL) injection 20 mg (not administered)  metoCLOPramide (REGLAN) injection 10 mg (not administered)  diphenhydrAMINE (BENADRYL) injection 25 mg (not administered)     Initial Impression / Assessment and Plan / ED Course  I have reviewed the triage vital signs and the nursing notes.  Pertinent labs & imaging results that were available during my care of the patient were reviewed by me and considered in my medical decision making (see chart for details).  Clinical Course as of Aug 03 152  Wed Aug 02, 2017  1815 Delta troponin negative.  [AM]    [phone] Patient feeling better.  D-dimer noted to be negative.  Feel that patient is stable for discharge with close follow-up with PCP and further cardiac workup.  Patient may also need a GI consult given symptomatic evidence of gastritis.  [AM]  2595 Patient reassessed.  Tachycardia resolved.  Patient is a symptomatically pain at this time.  I discussed return precautions with the patient for chest pain including severe recurrent chest pain, chest pain associated with radiation, dizziness, lightheadedness, diaphoresis.  Patient instructed to follow-up with her primary care provider to obtain further cardiac workup.  Patient is in understanding and agrees with plan of care.  [AM]    Clinical Course User Index [AM] Elisha Ponder, PA-C    4:42 PM patient nontoxic-appearing, afebrile, and in no acute distress at this time.  Do have concerns with a cardiac or pulmonary nature of patient's chest pain.  Differential diagnosis includes ACS, pulmonary embolism, thoracic aortic dissection, peptic ulcer disease, gastritis, GERD.  Given that patient chest x-ray, reviewed by me, is clear, do not suspect pneumonia at this time, and there are no other infectious symptoms.  No symptoms suggestive of acute bronchitis at this time.  Will obtain delta troponin.  Given that patient is resting pulse of  95-100, cannot rule out PE at this time with [facility] and [place] score.  Therefore will obtain d-dimer.  Patient is a pain in the plan of care.  Symptomatically given GI cocktail, Reglan, Benadryl, and IM Bentyl. Case discussed with Dr. Loren Racer.  Differential diagnosis for abdominal pain includes ACS, gastritis, GERD, pancreatitis, enlarging fatty liver.   Doubt ACS, as delta troponin is negative, initial and repeat EKGs show no signs of ischemia, infarction, or arrhythmia, and HEART score 3 (Age, story, 3 risk factors). Doubt PE D-dimer negative. Doubt TAD by hx, CXR showed no widening mediastinum, and pulses equal in  all extremities. Patient remained nontoxic appearing and in no acute distress during emergency department  course. Vital signs stable in the emergency department. Therefore, doubt esophageal rupture, cardiac tamponade, or pneumothorax. Pericarditis less likely due to no preceding infectious symptoms and pain not improved in upright positions.   Patient had complete resolution of abdominal pain with GI cocktail, IV Reglan, and IM Bentyl.  Do not feel that further workup of abdominal pain is necessary at this time, as patient has normal labs and normal urinalysis.  Given heart score and reassuring workup, patient can follow-up outpatient with her primary care provider for further cardiac workup.  Patient can return precautions for any new or worsening chest pain, radiating chest pain, nausea, vomiting, diaphoresis with chest pain, syncope or presyncope.  Patient is in understanding and agrees with plan of care.  This is a supervised visit with Dr. Loren Racer. Evaluation, management, and discharge planning discussed with this attending physician.   Final Clinical Impressions(s) / ED Diagnoses   Final diagnoses:  Left sided chest pain  Epigastric pain    ED Discharge Orders        Ordered    dicyclomine (BENTYL) 20 MG tablet  2 times daily     08/02/17 1935    sucralfate (CARAFATE) 1 g tablet  3 times daily with meals & bedtime     08/02/17 1935       Delia Chimes 08/03/17 Jonette Eva, MD 08/07/17 [phone]

## 2017-08-03 ENCOUNTER — Telehealth: Payer: Self-pay

## 2017-08-03 NOTE — Telephone Encounter (Signed)
Question about empagliflozin (JARDIANCE) 10 MG TABS tablet. Please call pt back.

## 2017-08-07 ENCOUNTER — Encounter: Payer: Self-pay | Admitting: Internal Medicine

## 2017-08-07 ENCOUNTER — Ambulatory Visit (INDEPENDENT_AMBULATORY_CARE_PROVIDER_SITE_OTHER): Admitting: Internal Medicine

## 2017-08-07 VITALS — BP 131/75 | HR 86 | Temp 97.9°F | Ht 67.0 in | Wt 266.5 lb

## 2017-08-07 DIAGNOSIS — Z8719 Personal history of other diseases of the digestive system: Secondary | ICD-10-CM | POA: Diagnosis not present

## 2017-08-07 DIAGNOSIS — R1084 Generalized abdominal pain: Secondary | ICD-10-CM | POA: Diagnosis not present

## 2017-08-07 DIAGNOSIS — R1013 Epigastric pain: Secondary | ICD-10-CM | POA: Diagnosis not present

## 2017-08-07 DIAGNOSIS — Z8711 Personal history of peptic ulcer disease: Secondary | ICD-10-CM | POA: Diagnosis not present

## 2017-08-07 NOTE — Assessment & Plan Note (Signed)
Patient returns for follow up from last week for abdominal pain.  She was evaluated in the ED and discharged to home after workup was unrevealing including UA, normal lipase (although may be normal if she has chronic pancreatitis), CBC, and CMET.  Her symptoms resolved with GI cocktail, reglan, and bentyl.  She reports hx of pancreatitis, gastroparesis, PUD, and hiatal hernia by former GI provider in [place].  Last endoscopy was around 2007.  Home meds include benytyl, reglan, PPI, H2 blocker, and carafate.  She reports symptoms as described last week have continued and she has no energy.  Pain is located diffusely but most notably in the epigastrum and is band-like.  Initially was sharp and now described as achy.  It is constant but subsides a little with Bentyl for a few hours.  Her normal bowel habits are to have a BM every 2 days.  This has not changed up until 2 days ago when she developed diarrhea described as watery and runny.  She also reports nausea but vomiting has resolved.  She denies melena.  She denies frequent NSAID use.  She does not recall treatment for H pylori ever in the past.  Plan: - Given her persistent and constant abdominal complaints despite treatment with multiple medications for GERD, gastroparesis will refer to GI for further evaluation as she may benefit from EGD to assist with diagnosis - For now, advised to continue her current home regimen as detailed above. - Advised bland diet and to advance as tolerated - Will repeat a CBC and CMET to ensure there has been no interval change.

## 2017-08-07 NOTE — Patient Instructions (Signed)
FOLLOW-UP INSTRUCTIONS When: As needed.  If you get worse, please call us sooner For: abdominal pain What to bring: medications  I have referred you to see the Stomach doctors to better evaluate your abdominal pain to see what may be going on. They will contact you with appointments.  I will check labs to make sure nothing has changed since last week.  Please try to eat a bland diet and advance as tolerated with your stomach pain.

## 2017-08-07 NOTE — Progress Notes (Signed)
Internal Medicine Clinic Attending  Case discussed with Dr. Melvin  at the time of the visit.  We reviewed the resident's history and exam and pertinent patient test results.  I agree with the assessment, diagnosis, and plan of care documented in the resident's note.  

## 2017-08-07 NOTE — Progress Notes (Signed)
   CC: follow up for abdominal pain  HPI:  [name] is a 42 y.o. woman with a past medical history listed below here today for follow up of her abdominal pain.  For details of today's visit and the status of her chronic medical issues please refer to the assessment and plan.   Past Medical History:  Diagnosis Date  . COPD (chronic obstructive pulmonary disease) (HCC)   . Diabetes mellitus without complication (HCC)   . Hypertension   . Thyroid disease    Review of Systems:   Please see pertinent ROS reviewed in HPI and problem based charting.   Physical Exam:  Vitals:   08/07/17 0923  BP: 131/75  Pulse: 86  Temp: 97.9 F (36.6 C)  TempSrc: Oral  SpO2: 96%  Weight: 266 lb 8 oz (120.9 kg)  Height: 5\' 7"  (1.702 m)   Physical Exam  Constitutional: She is well-developed, well-nourished, and in no distress.  HENT:  Head: Normocephalic and atraumatic.  Cardiovascular: Normal rate, regular rhythm and normal heart sounds.  Pulmonary/Chest: Effort normal and breath sounds normal.  Abdominal: Soft. Bowel sounds are normal. She exhibits no distension. There is tenderness. There is no rebound and no guarding.  She has mild tenderness throughout her abdomen but most noted in the epigastric area.     Assessment & Plan:   See Encounters Tab for problem based charting.  Patient discussed with Dr. Sandre Kittyaines.  Generalized abdominal pain Patient returns for follow up from last week for abdominal pain.  She was evaluated in the ED and discharged to home after workup was unrevealing including UA, normal lipase (although may be normal if she has chronic pancreatitis), CBC, and CMET.  Her symptoms resolved with GI cocktail, reglan, and bentyl.  She reports hx of pancreatitis, gastroparesis, PUD, and hiatal hernia by former GI provider in [place].  Last endoscopy was around 2007.  Home meds include benytyl, reglan, PPI, H2 blocker, and carafate.  She reports symptoms as described  last week have continued and she has no energy.  Pain is located diffusely but most notably in the epigastrum and is band-like.  Initially was sharp and now described as achy.  It is constant but subsides a little with Bentyl for a few hours.  Her normal bowel habits are to have a BM every 2 days.  This has not changed up until 2 days ago when she developed diarrhea described as watery and runny.  She also reports nausea but vomiting has resolved.  She denies melena.  She denies frequent NSAID use.  She does not recall treatment for H pylori ever in the past.  Plan: - Given her persistent and constant abdominal complaints despite treatment with multiple medications for GERD, gastroparesis will refer to GI for further evaluation as she may benefit from EGD to assist with diagnosis - For now, advised to continue her current home regimen as detailed above. - Advised bland diet and to advance as tolerated - Will repeat a CBC and CMET to ensure there has been no interval change.

## 2017-08-08 LAB — CMP14 + ANION GAP
ALT: 21 IU/L (ref 0–32)
AST: 17 IU/L (ref 0–40)
Albumin/Globulin Ratio: 1.5 (ref 1.2–2.2)
Albumin: 4.1 g/dL (ref 3.5–5.5)
Alkaline Phosphatase: 107 IU/L (ref 39–117)
Anion Gap: 19 mmol/L — ABNORMAL HIGH (ref 10.0–18.0)
BUN/Creatinine Ratio: 7 — ABNORMAL LOW (ref 9–23)
BUN: 6 mg/dL (ref 6–24)
Bilirubin Total: <0.2 mg/dL (ref 0.0–1.2)
CO2: 20 mmol/L (ref 20–29)
Calcium: 9.7 mg/dL (ref 8.7–10.2)
Chloride: 101 mmol/L (ref 96–106)
Creatinine, Ser: 0.81 mg/dL (ref 0.57–1.00)
GFR calc Af Amer: 104 mL/min/{1.73_m2} (ref >59)
GFR calc non Af Amer: 90 mL/min/{1.73_m2} (ref >59)
Globulin, Total: 2.8 g/dL (ref 1.5–4.5)
Glucose: 271 mg/dL — ABNORMAL HIGH (ref 65–99)
Potassium: 4.3 mmol/L (ref 3.5–5.2)
Sodium: 140 mmol/L (ref 134–144)
Total Protein: 6.9 g/dL (ref 6.0–8.5)

## 2017-08-08 LAB — CBC
Hematocrit: 40.4 % (ref 34.0–46.6)
Hemoglobin: 13.1 g/dL (ref 11.1–15.9)
MCH: 28.2 pg (ref 26.6–33.0)
MCHC: 32.4 g/dL (ref 31.5–35.7)
MCV: 87 fL (ref 79–97)
Platelets: 271 10*3/uL (ref 150–379)
RBC: 4.64 x10E6/uL (ref 3.77–5.28)
RDW: 13.7 % (ref 12.3–15.4)
WBC: 8.9 10*3/uL (ref 3.4–10.8)

## 2017-08-08 NOTE — Progress Notes (Signed)
Internal Medicine Clinic Attending  Case discussed with Dr. Earlene PlaterWallace  at the time of the visit.  We reviewed the resident's history and exam and pertinent patient test results.  I agree with the assessment, diagnosis, and plan of care documented in the resident's note.  Given her history, appropriate to refer to GI for more extensive evaluation, especially with ongoing symptoms while on multiple acid-suppressing medicines. Lab work is again reassuring with no decrease in H/H, and no sign of hepatobiliary or pancreatic disease. Chronic pancreatitis remains on the differential, but PUD or H pylori would be higher on the differential and need evaluation first.  Anne ShutterAlexander N Raines, MD

## 2017-08-10 ENCOUNTER — Telehealth: Payer: Self-pay | Admitting: *Deleted

## 2017-08-10 NOTE — Telephone Encounter (Signed)
[facility]/ PATIENT HAD BEEN PREVIOUSLY SEEN BY GI IN ANOTHER STATE, OFFICE WOULD NEED RECORDS / ALSO INSURANCE NOT IN NETWORK.Karen [name]. Karen [name] STATES THAT PATIENT SAID SHE DON'T NEED THE REFERRAL.

## 2017-08-10 NOTE — Telephone Encounter (Signed)
CALLED PATIENT,UNABLE TO LEAVE MESSAGE. PATIENT HAS NO VOICE MESSAGE SET UP .  WILL SEND PATIENT A LETTER.

## 2017-08-21 ENCOUNTER — Ambulatory Visit (INDEPENDENT_AMBULATORY_CARE_PROVIDER_SITE_OTHER): Admitting: Neurology

## 2017-08-21 DIAGNOSIS — G4720 Circadian rhythm sleep disorder, unspecified type: Secondary | ICD-10-CM

## 2017-08-21 DIAGNOSIS — G4733 Obstructive sleep apnea (adult) (pediatric): Secondary | ICD-10-CM

## 2017-08-21 DIAGNOSIS — G4710 Hypersomnia, unspecified: Secondary | ICD-10-CM

## 2017-08-21 DIAGNOSIS — Z87898 Personal history of other specified conditions: Secondary | ICD-10-CM

## 2017-08-21 DIAGNOSIS — R400 Somnolence: Secondary | ICD-10-CM

## 2017-08-21 DIAGNOSIS — R0683 Snoring: Secondary | ICD-10-CM

## 2017-08-21 DIAGNOSIS — Z820 Family history of epilepsy and other diseases of the nervous system: Secondary | ICD-10-CM

## 2017-08-21 DIAGNOSIS — Z6841 Body Mass Index (BMI) 40.0 and over, adult: Secondary | ICD-10-CM

## 2017-08-22 ENCOUNTER — Encounter: Admitting: Neurology

## 2017-08-22 DIAGNOSIS — Z6841 Body Mass Index (BMI) 40.0 and over, adult: Secondary | ICD-10-CM

## 2017-08-22 DIAGNOSIS — Z820 Family history of epilepsy and other diseases of the nervous system: Secondary | ICD-10-CM

## 2017-08-22 DIAGNOSIS — G4710 Hypersomnia, unspecified: Secondary | ICD-10-CM

## 2017-08-22 DIAGNOSIS — R400 Somnolence: Secondary | ICD-10-CM

## 2017-08-22 DIAGNOSIS — Z87898 Personal history of other specified conditions: Secondary | ICD-10-CM

## 2017-08-22 DIAGNOSIS — R0683 Snoring: Secondary | ICD-10-CM

## 2017-08-22 NOTE — Addendum Note (Signed)
Addended by: Tamera StandsHINNANT, Tylor Gambrill D on: 08/22/2017 01:15 PM   Modules accepted: Orders

## 2017-08-22 NOTE — Addendum Note (Signed)
Addended by: Geronimo RunningINKINS, Chasya Zenz A on: 08/22/2017 11:34 AM   Modules accepted: Orders

## 2017-08-23 NOTE — Procedures (Signed)
PATIENT'S NAME:  Karen [name], Karen [name] DOB:      07/14/1975      MR#:    [id number]     DATE OF RECORDING: 08/21/2017 REFERRING M.D.:  Ledell Noss MD Study Performed:   Baseline Polysomnogram with next day MSLT HISTORY: 42 year old right-handed woman with an underlying medical history of mood disorder, suboptimally controlled diabetes, COPD, thyroid disease, smoking, hypertension, and morbid obesity, who reports significant daytime somnolence. The patient endorsed the Epworth Sleepiness Scale at 22 points. The patient's weight 265 pounds with a height of 67 (inches), resulting in a BMI of 41.5 kg/m2. The patient's neck circumference measured 17 inches.  CURRENT MEDICATIONS: Albuterol, Norvasc, Qvar, Novolog, Levemir Flexpin, Novofine Plus Synthroid, Zestril, Claritin, Mevacor, Glucophage, Reglan, Lopressor, Singulair, Prilosec, Zantac, Carafate, Vit. E, Buspar, Cymbalta 30, Cymbalta 60, Ritalin   PROCEDURE:  This is a multichannel digital polysomnogram utilizing the Somnostar 11.2 system.  Electrodes and sensors were applied and monitored per AASM Specifications.   EEG, EOG, Chin and Limb EMG, were sampled at 200 Hz.  ECG, Snore and Nasal Pressure, Thermal Airflow, Respiratory Effort, CPAP Flow and Pressure, Oximetry was sampled at 50 Hz. Digital video and audio were recorded.      BASELINE STUDY  Of note, the patient has been off Ritalin, Cymbalta and Buspar. She reported taking Benadryl liquid before her nighttime sleep study to help her sleep.  Lights Out was at 22:29 and Lights On at 05:51.  Total recording time (TRT) was 442.5 minutes, with a total sleep time (TST) of 361.5 minutes.   The patient's sleep latency was 44.5 minutes, which is delayed. REM latency was 68 minutes, which is borderline reduced. The sleep efficiency was 81.7 %.     SLEEP ARCHITECTURE: WASO (Wake after sleep onset) was 36.5 minutes with minimal to mild sleep fragmentation noted.  There were 7.5 minutes in Stage N1, 262 minutes  Stage N2, 0 minutes Stage N3 and 92 minutes in Stage REM.  The percentage of Stage N1 was 2.1%, Stage N2 was 72.5%, which is increased, Stage N3 was absent, Stage R (REM sleep) was 25.4%, which is normal. The arousals were noted as: 35 were spontaneous, 0 were associated with PLMs, 2 were associated with respiratory events. Audio and video analysis did not show any abnormal or unusual movements, behaviors, phonations or vocalizations. The patient took no bathroom breaks. Mild snoring was noted. The EKG was in keeping with normal sinus rhythm (NSR).  RESPIRATORY ANALYSIS:  There were a total of 39 respiratory events:  2 obstructive apneas, 0 central apneas and 0 mixed apneas with a total of 2 apneas and an apnea index (AI) of .3 /hour. There were 37 hypopneas with a hypopnea index of 6.1 /hour. The patient also had 0 respiratory event related arousals (RERAs).      The total APNEA/HYPOPNEA INDEX (AHI) was 6.5/hour and the total RESPIRATORY DISTURBANCE INDEX was 6.5 /hour.  38 events occurred in REM sleep and 0 events in NREM. The REM AHI was 24.8 /hour, versus a non-REM AHI of .2. The patient spent 103 minutes of total sleep time in the supine position and 259 minutes in non-supine.. The supine AHI was 16.9 versus a non-supine AHI of 2.3.  OXYGEN SATURATION & C02:  The Wake baseline 02 saturation was 95%, with the lowest being 85%. Time spent below 89% saturation equaled 4 minutes.  PERIODIC LIMB MOVEMENTS: The patient had a total of 0 Periodic Limb Movements.  The Periodic Limb Movement (PLM) index was 0  and the PLM Arousal index was 0/hour.  Post-study, the patient indicated that sleep was better than usual.   The patient had a nap study, MSLT, next day on 08/22/17 with a mean sleep latency of 10.4 minutes for 4 naps and 3 SOREMPs (sleep onset REM periods) noted, namely during nap # 1, 2 and 4. Please see below for discussion.   IMPRESSION:  1. Obstructive Sleep Apnea (OSA) 2. Hypersomnia,  unspecified 3. Dysfunctions associated with sleep stages or arousal from sleep  RECOMMENDATIONS:  1. This overnight sleep study demonstrates overall mild obstructive sleep apnea, moderate during REM and supine sleep, with a total AHI of 6.5/hour, REM AHI of 24.8/hour, supine AHI of 16.9/hour, and O2 nadir of 85%. Given the patient's medical history and sleep related complaints, treatment with positive airway pressure is recommended; this can be achieved in the form of autoPAP. Alternatively, a full-night CPAP titration study would allow optimization of therapy if needed. Other treatment options for OSA may include avoidance of supine sleep position along with weight loss, upper airway or jaw surgery in selected patients or the use of an oral appliance in certain patients. ENT evaluation and/or consultation with a maxillofacial surgeon or dentist may be feasible in some instances.    2. Please note that untreated obstructive sleep apnea may carry additional perioperative morbidity. Patients with significant obstructive sleep apnea should receive perioperative PAP therapy and the surgeons and particularly the anesthesiologist should be informed of the diagnosis and the severity of the sleep disordered breathing. 3. The next day MSLT (mean sleep latency test) showed 3 SOREMPs (sleep onset REM periods). However, mean sleep latency for 4 naps was 10.4 minutes, indicating no significant degree of daytime somnolence. While 2 or more SOREMPs do raise the question of narcolepsy, this diagnosis also requires a mean sleep latency of less or up to 8 minutes which the patient did not have. SOREMPs can be seen in other clinical situations including underlying untreated obstructive sleep apnea, withdrawal from REM suppressing medications including antidepressants, particularly SSRIs, depression, delayed sleep phase syndrome, REM sleep rebound from prior REM sleep deprivation. Clinical correlation is recommended. She may  benefit from HLA testing for narcolepsy and future consideration of repeat PSG/MSLT while on PAP therapy.  4. The patient should be cautioned not to drive, work at heights, or operate dangerous or heavy equipment when tired or sleepy. Review and reiteration of good sleep hygiene measures should be pursued with any patient. 5. The patient will be seen in follow-up by Dr. Rexene Alberts at [facility] for discussion of the test results and further management strategies. The referring provider will be notified of the test results.  I certify that I have reviewed the entire raw data recording prior to the issuance of this report in accordance with the Standards of Accreditation of the American Academy of Sleep Medicine (AASM)       Star Age, MD, PhD Diplomat, American Board of Psychiatry and Neurology (Neurology and Sleep Medicine)

## 2017-08-26 LAB — COMPREHENSIVE DRUG ANALYSIS,UR

## 2017-08-28 NOTE — Progress Notes (Signed)
Urine test showed presence of benadryl, please ask when and why she took it.  Karen [name]

## 2017-08-30 ENCOUNTER — Telehealth: Payer: Self-pay

## 2017-08-30 NOTE — Procedures (Signed)
Name:  Karen [name], [place] Reference [id number]  Study Date: 08/22/2017 Procedure #: 1869  DOB: [birth year]    42 year old right-handed woman with an underlying medical history of mood disorder, suboptimally controlled diabetes, COPD, thyroid disease, smoking, hypertension, and morbid obesity, who reports significant daytime somnolence. The patient endorsed the Epworth Sleepiness Scale at 22 points. The patient's weight 265 pounds with a height of 67 (inches), resulting in a BMI of 41.5 kg/m2. The patient's neck circumference measured 17 inches. Patient reported taking Children's liq Benadryl prior to her nighttime sleep study. A UDS from 08/22/17 had shown presence of diphenhydramine.   Protocol  This is a 13 channel Multiple Sleep Latency Test comprised of 5 channels of EEG (T3-Cz, Cz-T4, F4-M1, C4-M1, O2-M1), 3 channels of Chin EMG, 4 channels of EOG and 1 channel for ECG.   All channels were sampled at 256hz .    This polysomnographic procedure is designed to evaluate (1) the complaint of excessive daytime sleepiness by quantifying the time required to fall asleep and (2) the possibility of narcolepsy by checking for abnormally short latencies to REM sleep.  Electrographic variables include EEG, EMG, EOG and ECG.  Patients are monitored throughout four or five 20-minute opportunities to sleep (naps) at two-hour intervals.  For each nap, the patient is allowed 20 minutes to fall asleep.  Once asleep, the patient is awakened after 15 minutes.  Between naps, the patient is kept as alert as possible.  A sleep latency of 20 minutes indicates that no sleep occurred.  Parametric Analysis  Total Number of Naps 4     NAP # Time of Nap  Sleep Latency (mins) REM Latency (mins) Sleep Time Percent Awake Time Percent  1 07:19 10.5 8 50 50   2 09:06 9.5 0 37  63   3 11:03 9 0 43  57   4 13:01 12.5 8 43  57   5 ---        MSLT Summary of Naps  Sleepiness Index: 48.1  Mean Sleep Latency to all Five  Naps: 0  Mean Sleep Latency to First Four Naps: 10.4  Mean Sleep Latency to First Three Naps: 9.7  Mean Sleep Latency to First Two Naps: 10  Number of Naps with REM Sleep: 2    Results from Preceding PSG Study  Sleep Onset Time 23:13 Sleep Efficiency (%) 81.7  Rise Time 05:51 Sleep Latency (min) 45.5  Total Sleep Time  361.5 REM Latency (min) 68    I attest to having reviewed every epoch of the entire raw data recording prior to the issuance of this report in accordance with the Standards of the American Academy of Sleep Medicine.    IMPRESSION: 1. Obstructive Sleep Apnea (OSA) 2. Hypersomnia, unspecified 3. Dysfunctions associated with sleep stages or arousal from sleep  RECOMMENDATIONS: 1. The overnight sleep study demonstrates overall mild obstructive sleep apnea, moderate during REM and supine sleep, with a total AHI of 6.5/hour, REM AHI of 24.8/hour, supine AHI of 16.9/hour, and O2 nadir of 85%. Given the patient's medical history and sleep related complaints, treatment with positive airway pressure is recommended; this can be achieved in the form of autoPAP. Alternatively, a full-night CPAP titration study would allow optimization of therapy if needed. Other treatment options for OSA may include avoidance of supine sleep position along with weight loss, upper airway or jaw surgery in selected patients or the use of an oral appliance in certain patients. ENT evaluation and/or consultation with a maxillofacial surgeon or dentist  may be feasible in some instances.    2. Please note that untreated obstructive sleep apnea may carry additional perioperative morbidity. Patients with significant obstructive sleep apnea should receive perioperative PAP therapy and the surgeons and particularly the anesthesiologist should be informed of the diagnosis and the severity of the sleep disordered breathing. 3. This MSLT (mean sleep latency test) showed 3 SOREMPs (sleep onset REM periods). However, mean  sleep latency for 4 naps was 10.4 minutes, indicating no significant degree of daytime somnolence. While 2 or more SOREMPs do raise the question of narcolepsy, this diagnosis also requires a mean sleep latency of less or up to 8 minutes which the patient did not have. SOREMPs can be seen in other clinical situations including underlying untreated obstructive sleep apnea, withdrawal from REM suppressing medications including antidepressants, particularly SSRIs, depression, delayed sleep phase syndrome, REM sleep rebound from prior REM sleep deprivation. Clinical correlation is recommended. She may benefit from HLA testing for narcolepsy and future consideration of repeat PSG/MSLT while on PAP therapy.  4. The patient should be cautioned not to drive, work at heights, or operate dangerous or heavy equipment when tired or sleepy. Review and reiteration of good sleep hygiene measures should be pursued with any patient. 5. The patient will be seen in follow-up by Dr. Rexene Alberts at [facility] for discussion of the test results and further management strategies. The referring provider will be notified of the test results.  I certify that I have reviewed the entire raw data recording prior to the issuance of this report in accordance with the Standards of Accreditation of the American Academy of Sleep Medicine (AASM)   Star Age, MD, PhD Diplomat, American Board of Psychiatry and Neurology (Neurology and Sleep Medicine)

## 2017-08-30 NOTE — Telephone Encounter (Addendum)
I called pt, had an extended conversation with her. I explained that her nighttime sleep study showed evidence of mild sleep apnea. The sleep study the next day showed that she did fall asleep but did not have any significant degree of sleepiness, and not in the realm of narcolepsy. She did have dream sleep in her naps but Dr. Frances FurbishAthar cannot confirm narcolepsy based on this test. Dr. Frances FurbishAthar recommends that pt start auto pap for her mild osa, in the hopes that she feels better after treatment, such as less sleep and better energy. I explained that this order will be sent to a DME, and they will call pt within a week to discuss financial information and auto pap set up. I explained that pt should use the auto pap at least 4 hours nightly, and then should follow up in July with Dr. Frances FurbishAthar.   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 Something is wrong with me and no one cares." I explained to her several times that based on this test, we cannot confirm narcolepsy, it is that she does not have narcolepsy, but that this particular test did not confirm narcolepsy.  I advised pt that she should discuss with her PCP and her mental health provider about restarting her antidepressants. Pt says " Don't worry, I already took care of that on my own, and I'm back on meds for that. They told me that I had to be off those meds before my sleep study so I stopped them, but I'm back on them."  I  offered her an appt in July with Dr. Frances FurbishAthar, explaining that insurances generally require a 30-90 day post auto pap follow up with the ordering physician and at that appt, Dr. Frances FurbishAthar will consider drawing blood to check for a narcolepsy marker. Pt agreed to an appt in July and said to "send the order in" for her auto pap. She said that she would give the auto pap a try, that she lives with someone that uses an auto pap, and she knows how it works. She says that she will call back to cancel the July appt if she decides that she does not want the appt because she will be talking to her other doctor on what else she can do.  Pt is tearful at this point in the phone call, and I asked her if there was anything else that I can do for her besides sent the order for her auto pap to a DME, such as a sooner appt, and she became profane, telling me that since I think she is "crazy" and that I'm not helping her to begin with, she does not want my help, and hangs up the phone.

## 2017-08-30 NOTE — Telephone Encounter (Signed)
-----  Message from Star Age, MD sent at 08/30/2017  4:27 PM EDT ----- Patient referred by Dr. Hetty Ely, seen by me on 07/13/17, diagnostic PSG on 3/25 and MSLT on 08/22/17.  Please advise patient that her sleep study and next a nap study showed evidence of mild sleep apnea. She did not have any significant degree of sleepiness the next day even though she did fall asleep but not in the realm of narcolepsy. She also had dream sleep in her naps, but at this point, I cannot confirm narcolepsy.  I would like her to start AutoPap therapy for her mild sleep apnea to see, if she feels better after treatment, ie less sleepy, better energy, etc. We don't have to bring her back for another sleep study with CPAP, but will let her try an autoPAP machine at home, through a DME company (of her choice, or as per insurance requirement). The DME representative will educate her on how to use the machine, how to put the mask on, etc. I have placed an order in the chart.  We talked about her mood disorder at the time of her appointment. She had been on Cymbalta and BuSpar but was no longer taking these medications. If she has a history of anxiety and/or depression, she is encouraged to follow-up with her primary care or mental health provider and see about restarting her antidepressant medication. When she comes in for her follow-up appointment, I would like for [place] to do some blood work for narcolepsy, called HLA testing which is a marker for narcolepsy but not a confirmatory test for narcolepsy. Please send referral, talk to patient, send report to referring MD. We will need a FU in sleep clinic for 10 weeks post-PAP set up, please arrange that with me or one of our NPs. Thanks,   Star Age, MD, PhD [facility])

## 2017-08-30 NOTE — Progress Notes (Signed)
Patient referred by Dr. Hetty Ely, seen by me on 07/13/17, diagnostic PSG on 3/25 and MSLT on 08/22/17.  Please advise patient that her sleep study and next a nap study showed evidence of mild sleep apnea. She did not have any significant degree of sleepiness the next day even though she did fall asleep but not in the realm of narcolepsy. She also had dream sleep in her naps, but at this point, I cannot confirm narcolepsy.  I would like her to start AutoPap therapy for her mild sleep apnea to see, if she feels better after treatment, ie less sleepy, better energy, etc. We don't have to bring her back for another sleep study with CPAP, but will let her try an autoPAP machine at home, through a DME company (of her choice, or as per insurance requirement). The DME representative will educate her on how to use the machine, how to put the mask on, etc. I have placed an order in the chart.  We talked about her mood disorder at the time of her appointment. She had been on Cymbalta and BuSpar but was no longer taking these medications. If she has a history of anxiety and/or depression, she is encouraged to follow-up with her primary care or mental health provider and see about restarting her antidepressant medication. When she comes in for her follow-up appointment, I would like for [place] to do some blood work for narcolepsy, called HLA testing which is a marker for narcolepsy but not a confirmatory test for narcolepsy. Please send referral, talk to patient, send report to referring MD. We will need a FU in sleep clinic for 10 weeks post-PAP set up, please arrange that with me or one of our NPs. Thanks,   Star Age, MD, PhD [facility])

## 2017-08-30 NOTE — Addendum Note (Signed)
Addended by: Huston FoleyATHAR, Lorynn Moeser on: 08/30/2017 04:27 PM   Modules accepted: Orders

## 2017-08-31 NOTE — Telephone Encounter (Signed)
Zella BallRobin, please reach out to patient one more time.

## 2017-08-31 NOTE — Telephone Encounter (Signed)
Patient called back and I attempted to explain why we are wanting to set her up on Cpap. I explained to patient that this treatment needs to come first.Patient did interrupt and talked for 10 minutes about her history. She is worried she is going to past out and die. I continued to explain what happens when you obstruct at night and how your oxygen level drops. This could cause sleepiness during day. I encouraged her to get the cpap machine and see if it makes a difference in how she feels. I told her We would check a download and see if her apnea is treated. I told her I would call Dme company and see if they would call her ASAP.  Her main concern is her passing out but she will try cpap first.

## 2017-08-31 NOTE — Telephone Encounter (Signed)
Called patient, no answer and no voicemail set up. Will reach out her again in AM.

## 2017-09-09 ENCOUNTER — Encounter (HOSPITAL_COMMUNITY): Payer: Self-pay | Admitting: Emergency Medicine

## 2017-09-09 ENCOUNTER — Emergency Department (HOSPITAL_COMMUNITY)
Admission: EM | Admit: 2017-09-09 | Discharge: 2017-09-09 | Disposition: A | Discharge status: Home / Self Care | Attending: Emergency Medicine | Admitting: Emergency Medicine

## 2017-09-09 ENCOUNTER — Other Ambulatory Visit: Payer: Self-pay

## 2017-09-09 ENCOUNTER — Emergency Department (HOSPITAL_COMMUNITY)

## 2017-09-09 DIAGNOSIS — Z794 Long term (current) use of insulin: Secondary | ICD-10-CM | POA: Insufficient documentation

## 2017-09-09 DIAGNOSIS — E119 Type 2 diabetes mellitus without complications: Secondary | ICD-10-CM | POA: Insufficient documentation

## 2017-09-09 DIAGNOSIS — M25532 Pain in left wrist: Secondary | ICD-10-CM

## 2017-09-09 DIAGNOSIS — M25522 Pain in left elbow: Secondary | ICD-10-CM | POA: Insufficient documentation

## 2017-09-09 DIAGNOSIS — M545 Low back pain, unspecified: Secondary | ICD-10-CM

## 2017-09-09 DIAGNOSIS — Z79899 Other long term (current) drug therapy: Secondary | ICD-10-CM | POA: Diagnosis not present

## 2017-09-09 DIAGNOSIS — W19XXXA Unspecified fall, initial encounter: Secondary | ICD-10-CM

## 2017-09-09 DIAGNOSIS — I10 Essential (primary) hypertension: Secondary | ICD-10-CM | POA: Diagnosis not present

## 2017-09-09 DIAGNOSIS — E039 Hypothyroidism, unspecified: Secondary | ICD-10-CM | POA: Insufficient documentation

## 2017-09-09 DIAGNOSIS — F17210 Nicotine dependence, cigarettes, uncomplicated: Secondary | ICD-10-CM | POA: Diagnosis not present

## 2017-09-09 DIAGNOSIS — J449 Chronic obstructive pulmonary disease, unspecified: Secondary | ICD-10-CM | POA: Insufficient documentation

## 2017-09-09 DIAGNOSIS — W010XXA Fall on same level from slipping, tripping and stumbling without subsequent striking against object, initial encounter: Secondary | ICD-10-CM | POA: Insufficient documentation

## 2017-09-09 DIAGNOSIS — M25571 Pain in right ankle and joints of right foot: Secondary | ICD-10-CM | POA: Insufficient documentation

## 2017-09-09 MED ORDER — METHOCARBAMOL 750 MG PO TABS: 750.0000 mg | ORAL_TABLET | Freq: Three times a day (TID) | ORAL | 0 refills | Status: DC | PRN

## 2017-09-09 NOTE — ED Notes (Signed)
Pt fell on a wet floor yesterday by accident, pt complains of pain to the left hand, left elbow, lower back, right ankle and right foot. Able to move all extremities and ambulatory.

## 2017-09-09 NOTE — ED Notes (Signed)
Pt verbalized understanding of d/c instructions and has no further questions, VSS, NAD.  

## 2017-09-09 NOTE — ED Triage Notes (Signed)
Pt. Stated[facility] and my left side of my butt hurts, left elbow, and foot pain. This happened yesterday.

## 2017-09-09 NOTE — Progress Notes (Signed)
Orthopedic Tech Progress Note Patient Details:  Karen CastleKeisha [name] 02/10/1976 [id number]  Ortho Devices Type of Ortho Device: ASO, Crutches, Thumb velcro splint Ortho Device/Splint Location: lue/rle Ortho Device/Splint Interventions: Application   Post Interventions Patient Tolerated: Well Instructions Provided: Care of device   Nikki DomCrawford, Jaydian Santana 09/09/2017, 11:31 AM

## 2017-09-09 NOTE — Discharge Instructions (Addendum)
You may take ibuprofen or Aleve for your pain.  Please do not take Aleve with ibuprofen, Motrin, or other nonsteroidal anti-inflammatory drugs.  Please follow-up with your doctor.  You may use the crutches and right ankle splint for comfort.  Please do not remove the left wrist splint until your doctor tells you you may.

## 2017-09-09 NOTE — ED Notes (Signed)
Ortho paged and on the way 

## 2017-09-09 NOTE — ED Provider Notes (Signed)
[facility] EMERGENCY DEPARTMENT Provider Note   CSN: [id number] Arrival date & time: 09/09/17  4098     History   Chief Complaint Chief Complaint  Patient presents with  . Fall  . Foot Pain  . Back Pain  . Extremity Laceration    HPI Karen [name] is a 42 y.o. female with a history of fibromyalgia, narcolepsy with cataplexy, diabetes, COPD, who presents today for evaluation of her fall.  She reports that yesterday she was at a store and it was rainy and she had a mechanical slip and fall.  She reports that initially she felt slightly sore however thought she was okay.  She woke up this morning with increased pain in her right foot, right ankle, left elbow, left wrist, and lower back.  She denies striking her head, no loss of consciousness.  She has been ambulatory since.  No new numbness or tingling.  Denies feeling weak.  HPI  Past Medical History:  Diagnosis Date  . COPD (chronic obstructive pulmonary disease) (HCC)   . Diabetes mellitus without complication (HCC)   . Hypertension   . Thyroid disease     Patient Active Problem List   Diagnosis Date Noted  . Generalized abdominal pain 08/02/2017  . Chest pain 08/02/2017  . Hypothyroidism 04/17/2017  . Diabetes mellitus (HCC) 04/17/2017  . Hypertension associated with diabetes (HCC) 04/17/2017  . COPD (chronic obstructive pulmonary disease) (HCC) 04/17/2017  . GERD (gastroesophageal reflux disease) 04/17/2017  . 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 Fibromyalgia syndrome 04/17/2017  . 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 btl    . CHOLECYSTECTOMY    . shoulder Bilateral      OB History   None      Home Medications    Prior to Admission medications   Medication Sig Start Date End Date Taking? Authorizing Provider  albuterol (PROVENTIL HFA;VENTOLIN HFA) 108 (90 Base) MCG/ACT inhaler Inhale 2 puffs into the lungs every 4 (four) hours as  needed for wheezing or shortness of breath. 06/01/17   Deneise Lever, MD  amLODipine (NORVASC) 10 MG tablet Take 1 tablet (10 mg total) by mouth daily. 07/10/17   Eulah Pont, MD  beclomethasone (QVAR) 40 MCG/ACT inhaler Inhale into the lungs 2 (two) times daily.    [provider]  Blood Glucose Monitoring Suppl (FREESTYLE LITE) DEVI To check blood glucose 3 times daily before meals and at bedtime 07/10/17   Eulah Pont, MD  busPIRone (BUSPAR) 10 MG tablet Take 1 tablet (10 mg total) by mouth 2 (two) times daily. Patient not taking: Reported on 07/13/2017 03/15/17   Gerhard Munch, MD  dicyclomine (BENTYL) 20 MG tablet Take 1 tablet (20 mg total) by mouth 2 (two) times daily. 08/02/17   Aviva Kluver B, PA-C  DULoxetine (CYMBALTA) 30 MG capsule Take 1 capsule (30 mg total) daily by mouth. Pt takes with 60 mg to equal 90 mg Patient not taking: Reported on 07/13/2017 04/17/17   Lanelle Bal, MD  DULoxetine (CYMBALTA) 60 MG capsule Take 1 capsule (60 mg total) daily by mouth. Patient takes with 60 mg to equal 90 mg Patient not taking: Reported on 07/13/2017 04/17/17   Lanelle Bal, MD  empagliflozin (JARDIANCE) 10 MG TABS tablet Take 10 mg by mouth daily. 07/18/17   Eulah Pont, MD  glucose blood (FREESTYLE LITE) test strip Use as instructed 07/10/17   Eulah Pont, MD  insulin aspart (NOVOLOG FLEXPEN) 100  UNIT/ML FlexPen Inject 10 Units into the skin 3 (three) times daily with meals. 06/02/17   Deneise Lever, MD  Insulin Detemir (LEVEMIR FLEXPEN) 100 UNIT/ML Pen Inject 35 Units into the skin daily at 10 pm. IM program 06/02/17   Deneise Lever, MD  Insulin Pen Needle (NOVOFINE PLUS) 32G X 4 MM MISC 1 Dose by Does not apply route 4 (four) times daily. Use with Levemir and Novolog, IM program 06/01/17   Deneise Lever, MD  Lancets (FREESTYLE) lancets Use as instructed 07/10/17   Eulah Pont, MD  levothyroxine (SYNTHROID, LEVOTHROID) 150 MCG tablet Take 1 tablet (150 mcg total) by mouth daily before  breakfast. 06/01/17   Deneise Lever, MD  lisinopril (PRINIVIL,ZESTRIL) 40 MG tablet Take 1 tablet (40 mg total) by mouth daily. 06/01/17   Deneise Lever, MD  loratadine (CLARITIN) 10 MG tablet Take 1 tablet (10 mg total) by mouth daily. 03/15/17   Gerhard Munch, MD  lovastatin (MEVACOR) 10 MG tablet Take 1 tablet (10 mg total) by mouth at bedtime. 06/01/17   Deneise Lever, MD  metFORMIN (GLUCOPHAGE) 500 MG tablet Take 2 tablets (1,000 mg total) by mouth 2 (two) times daily with a meal. 06/01/17   Deneise Lever, MD  methocarbamol (ROBAXIN) 750 MG tablet Take 1-2 tablets (750-1,500 mg total) by mouth 3 (three) times daily as needed for muscle spasms. 09/09/17   Cristina Gong, PA-C  methylphenidate (RITALIN) 20 MG tablet Take 1 tablet (20 mg total) by mouth 3 (three) times daily with meals. Patient not taking: Reported on 07/13/2017 03/15/17   Gerhard Munch, MD  metoCLOPramide (REGLAN) 5 MG tablet Take 1 tablet (5 mg total) at bedtime by mouth. 04/17/17   Lanelle Bal, MD  metoprolol tartrate (LOPRESSOR) 25 MG tablet Take 1 tablet (25 mg total) by mouth 2 (two) times daily. 06/01/17   Deneise Lever, MD  montelukast (SINGULAIR) 10 MG tablet Take 1 tablet (10 mg total) by mouth at bedtime. 06/01/17   Deneise Lever, MD  omeprazole (PRILOSEC) 40 MG capsule Take 1 capsule (40 mg total) by mouth 2 (two) times daily at 8 am and 10 pm. 06/01/17   Deneise Lever, MD  ranitidine (ZANTAC) 300 MG tablet Take 1 tablet (300 mg total) at bedtime by mouth. 04/17/17   Lanelle Bal, MD  sucralfate (CARAFATE) 1 g tablet Take 1 tablet (1 g total) by mouth 4 (four) times daily -  with meals and at bedtime for 7 days. 08/02/17 08/09/17  Aviva Kluver B, PA-C  vitamin E 400 UNIT capsule Take 1 capsule (400 Units total) by mouth daily. 03/15/17   Gerhard Munch, MD    Family History No family history on file.  Social History Social History   Tobacco Use  . Smoking status: Current Every Day Smoker     Packs/day: 0.50    Types: Cigarettes  . Smokeless tobacco: Never Used  . Tobacco comment: 0.5 pack/day cutting back  Substance Use Topics  . Alcohol use: Yes  . Drug use: No     Allergies   Aspirin and Tylenol [acetaminophen]   Review of Systems Review of Systems  Constitutional: Negative for chills and fever.  Respiratory: Negative for shortness of breath.   Gastrointestinal: Negative for abdominal pain.  Genitourinary: Negative for dysuria and hematuria.  Musculoskeletal: Positive for back pain. Negative for neck pain and neck stiffness.       Pain in left wrist, left elbow, right foot, right ankle.  Skin: Negative for color change and wound.  Neurological: Negative for syncope, weakness, numbness and headaches.  All other systems reviewed and are negative.    Physical Exam Updated Vital Signs BP 110/72 (BP Location: Right Arm)   Pulse 90   Temp 98 F (36.7 C) (Oral)   Resp 16   Ht 5\' 7"  (1.702 m)   Wt 120.7 kg (266 lb)   LMP 09/06/2017   SpO2 98%   BMI 41.66 kg/m   Physical Exam  Constitutional: She appears well-developed and well-nourished.  HENT:  Head: Normocephalic and atraumatic.  Cardiovascular:  Left hand, right foot are both neurovascularly intact with brisk capillary refill.  Musculoskeletal:  Left upper extremity: Patient has anatomic snuffbox tenderness to palpation.  There are no obvious deformities or crepitus.  No obvious swelling or bruising.  Elbow has full active range of motion including flexion extension pronation and supination.  5/5 grip strength.  Right lower extremity: There is mild swelling anterior to the lateral malleolus with associated tenderness to palpation.  Remainder of foot is grossly nontender.  No deformities, crepitus noted.  Ankle joint is grossly stable to my exam.  C/T/L-spine palpated without step-offs deformities or midline tenderness to palpation.  There is bilateral lumbar paraspinal/lateral muscle tenderness to  palpation.  Palpation over this area both re-creates and exacerbates her reported lower back pain.  Neurological: She is alert.  Skin: Skin is warm and dry. She is not diaphoretic.  No obvious wounds over right foot, right ankle, left wrist, left elbow.  Nursing note and vitals reviewed.    ED Treatments / Results  Labs (all labs ordered are listed, but only abnormal results are displayed) Labs Reviewed - No data to display  EKG None  Radiology Dg Elbow Complete Left  Result Date: 09/09/2017 CLINICAL DATA:  Fall. EXAM: LEFT ELBOW - COMPLETE 3+ VIEW COMPARISON:  None. FINDINGS: There is no evidence of fracture, dislocation, or joint effusion. There is no evidence of arthropathy or other focal bone abnormality. Soft tissues are unremarkable. IMPRESSION: Negative. Electronically Signed   By: Obie Dredge M.D.   On: 09/09/2017 10:28   Dg Ankle Complete Right  Result Date: 09/09/2017 CLINICAL DATA:  Right ankle and foot pain after fall yesterday. EXAM: RIGHT FOOT COMPLETE - 3+ VIEW; RIGHT ANKLE - COMPLETE 3+ VIEW COMPARISON:  None. FINDINGS: No acute fracture or dislocation. No tibiotalar joint effusion. The talar dome is intact. The ankle mortise is symmetric. Joint spaces are preserved. Bone mineralization is normal. Plantar and calcaneal enthesophytes. Soft tissues are unremarkable. IMPRESSION: 1. No acute osseous abnormality of the right ankle and foot. Electronically Signed   By: Obie Dredge M.D.   On: 09/09/2017 10:27   Dg Hand Complete Left  Result Date: 09/09/2017 CLINICAL DATA:  Fall yesterday. EXAM: LEFT HAND - COMPLETE 3+ VIEW COMPARISON:  None. FINDINGS: There is no evidence of fracture or dislocation. There is no evidence of arthropathy or other focal bone abnormality. Soft tissues are unremarkable. IMPRESSION: Negative. Electronically Signed   By: Obie Dredge M.D.   On: 09/09/2017 10:28   Dg Foot Complete Right  Result Date: 09/09/2017 CLINICAL DATA:  Right ankle  and foot pain after fall yesterday. EXAM: RIGHT FOOT COMPLETE - 3+ VIEW; RIGHT ANKLE - COMPLETE 3+ VIEW COMPARISON:  None. FINDINGS: No acute fracture or dislocation. No tibiotalar joint effusion. The talar dome is intact. The ankle mortise is symmetric. Joint spaces are preserved. Bone mineralization is normal. Plantar and calcaneal enthesophytes. Soft tissues are unremarkable. IMPRESSION: 1. No acute osseous  abnormality of the right ankle and foot. Electronically Signed   By: Obie DredgeWilliam T Derry M.D.   On: 09/09/2017 10:27    Procedures Procedures (including critical care time)  Medications Ordered in ED Medications - No data to display   Initial Impression / Assessment and Plan / ED Course  I have reviewed the triage vital signs and the nursing notes.  Pertinent labs & imaging results that were available during my care of the patient were reviewed by me and considered in my medical decision making (see chart for details).  Clinical Course as of Sep 10 1107  Sat Sep 09, 2017  1015 Attempted to see patient, x-rays in process.  Will see patient once x-rays obtained and resulted.    [EH]    Clinical Course User Index [EH] Cristina GongHammond, Sharicka Pogorzelski W, PA-C   Patient presents today for evaluation of pain in multiple areas after a mechanical slip and fall yesterday.  Left upper extremity x-rays were unremarkable, she does have tenderness over anatomic snuffbox, will be given thumb spica, instructions to follow-up with PCP.  Right lower extremity x-rays unremarkable, she does have soft tissue swelling consistent with a sprain/strain.  She is given an ASO and crutches to help with comfort.  Lower back has lateral tenderness to palpation consistent with muscle spasm/strain.  She is given a prescription for Robaxin.  She denies any other injuries, did not strike her head or pass out during this fall.  She was given return precautions and states her understanding.  She was instructed to prescribe medication may  make her drowsy and that she should refrain from any activities where she could injure herself or someone else.  Patient given PCP follow-up for evaluation, along with repeat imaging of left wrist.  Patient discharged home   Final Clinical Impressions(s) / ED Diagnoses   Final diagnoses:  Fall, initial encounter  Left wrist pain  Acute right ankle pain  Acute bilateral low back pain without sciatica  Left elbow pain    ED Discharge Orders        Ordered    methocarbamol (ROBAXIN) 750 MG tablet  3 times daily PRN     09/09/17 1054       Cristina GongHammond, Estefania Kamiya W, [place]PA-C 09/09/17 1117    Vanetta MuldersZackowski, Scott, MD 09/09/17 1132

## 2017-09-11 ENCOUNTER — Telehealth: Payer: Self-pay | Admitting: *Deleted

## 2017-09-11 ENCOUNTER — Other Ambulatory Visit: Payer: Self-pay

## 2017-09-11 DIAGNOSIS — E039 Hypothyroidism, unspecified: Secondary | ICD-10-CM

## 2017-09-11 NOTE — Telephone Encounter (Signed)
levothyroxine (SYNTHROID, LEVOTHROID) 150 MCG tablet, refill request @ [facility] mail order. Would like to speak with a nurse.

## 2017-09-11 NOTE — Telephone Encounter (Signed)
Pt presented Rx from 02/2017 ED visit.  Pharm D asked if appropriate to fill.  EDCM advised that Rx are usually valid for 30 days.  EDCM advised to have pt seen by PCP to renew Rx.  No further EDCM needs identified at this time. Roselynne Lortz J. Lucretia RoersWood, RN, BSN, [place] [phone]

## 2017-09-13 MED ORDER — LEVOTHYROXINE SODIUM 150 MCG PO TABS: 150.0000 ug | ORAL_TABLET | Freq: Every day | ORAL | 3 refills | Status: DC

## 2017-09-14 ENCOUNTER — Ambulatory Visit

## 2017-09-19 ENCOUNTER — Other Ambulatory Visit: Payer: Self-pay | Admitting: *Deleted

## 2017-09-19 ENCOUNTER — Other Ambulatory Visit: Payer: Self-pay

## 2017-09-19 DIAGNOSIS — I10 Essential (primary) hypertension: Secondary | ICD-10-CM

## 2017-09-19 MED ORDER — LOVASTATIN 10 MG PO TABS: 10.0000 mg | ORAL_TABLET | Freq: Every day | ORAL | 3 refills | Status: DC

## 2017-09-19 NOTE — Telephone Encounter (Signed)
Pt states she is still waiting for the nurse to call her back since 09/11/2017. Please call pt back about med.

## 2017-09-20 NOTE — Telephone Encounter (Signed)
rtc to pt, went over her medlist, she only needed 1 refill, [facility] wants 90 day scripts, 90 day request sent to pcp

## 2017-09-21 ENCOUNTER — Ambulatory Visit

## 2017-09-26 ENCOUNTER — Encounter: Payer: Self-pay | Admitting: Internal Medicine

## 2017-09-26 ENCOUNTER — Ambulatory Visit (INDEPENDENT_AMBULATORY_CARE_PROVIDER_SITE_OTHER): Admitting: Internal Medicine

## 2017-09-26 VITALS — BP 116/68 | HR 104 | Temp 99.0°F | Ht 67.0 in | Wt 271.9 lb

## 2017-09-26 DIAGNOSIS — K219 Gastro-esophageal reflux disease without esophagitis: Secondary | ICD-10-CM

## 2017-09-26 DIAGNOSIS — F319 Bipolar disorder, unspecified: Secondary | ICD-10-CM

## 2017-09-26 DIAGNOSIS — R42 Dizziness and giddiness: Secondary | ICD-10-CM

## 2017-09-26 DIAGNOSIS — G4733 Obstructive sleep apnea (adult) (pediatric): Secondary | ICD-10-CM

## 2017-09-26 DIAGNOSIS — E0800 Diabetes mellitus due to underlying condition with hyperosmolarity without nonketotic hyperglycemic-hyperosmolar coma (NKHHC): Secondary | ICD-10-CM

## 2017-09-26 DIAGNOSIS — Z23 Encounter for immunization: Secondary | ICD-10-CM | POA: Diagnosis not present

## 2017-09-26 DIAGNOSIS — M797 Fibromyalgia: Secondary | ICD-10-CM | POA: Diagnosis not present

## 2017-09-26 DIAGNOSIS — E118 Type 2 diabetes mellitus with unspecified complications: Secondary | ICD-10-CM

## 2017-09-26 DIAGNOSIS — E1159 Type 2 diabetes mellitus with other circulatory complications: Secondary | ICD-10-CM

## 2017-09-26 DIAGNOSIS — Z9989 Dependence on other enabling machines and devices: Secondary | ICD-10-CM | POA: Diagnosis not present

## 2017-09-26 DIAGNOSIS — I10 Essential (primary) hypertension: Secondary | ICD-10-CM

## 2017-09-26 DIAGNOSIS — Z79890 Hormone replacement therapy (postmenopausal): Secondary | ICD-10-CM

## 2017-09-26 DIAGNOSIS — Z114 Encounter for screening for human immunodeficiency virus [HIV]: Secondary | ICD-10-CM

## 2017-09-26 DIAGNOSIS — J449 Chronic obstructive pulmonary disease, unspecified: Secondary | ICD-10-CM | POA: Diagnosis not present

## 2017-09-26 DIAGNOSIS — E039 Hypothyroidism, unspecified: Secondary | ICD-10-CM

## 2017-09-26 DIAGNOSIS — Z794 Long term (current) use of insulin: Secondary | ICD-10-CM | POA: Diagnosis not present

## 2017-09-26 DIAGNOSIS — Z720 Tobacco use: Secondary | ICD-10-CM

## 2017-09-26 DIAGNOSIS — Z79899 Other long term (current) drug therapy: Secondary | ICD-10-CM

## 2017-09-26 DIAGNOSIS — F310 Bipolar disorder, current episode hypomanic: Secondary | ICD-10-CM

## 2017-09-26 HISTORY — DX: Obstructive sleep apnea (adult) (pediatric): G47.33

## 2017-09-26 LAB — POCT GLYCOSYLATED HEMOGLOBIN (HGB A1C): Hemoglobin A1C: 7.9

## 2017-09-26 LAB — GLUCOSE, CAPILLARY
GLUCOSE-CAPILLARY: 165 mg/dL — AB (ref 65–99)
Glucose-Capillary: 191 mg/dL — ABNORMAL HIGH (ref 65–99)

## 2017-09-26 MED ORDER — FREESTYLE LITE DEVI: 1 refills | Status: DC

## 2017-09-26 MED ORDER — METOPROLOL TARTRATE 25 MG PO TABS: 12.5000 mg | ORAL_TABLET | Freq: Two times a day (BID) | ORAL | 3 refills | Status: DC

## 2017-09-26 MED ORDER — EMPAGLIFLOZIN 10 MG PO TABS: 10.0000 mg | ORAL_TABLET | Freq: Every day | ORAL | 3 refills | Status: DC

## 2017-09-26 MED ORDER — INSULIN DETEMIR 100 UNIT/ML FLEXPEN: 40.0000 [IU] | PEN_INJECTOR | Freq: Every day | SUBCUTANEOUS | 11 refills | Status: DC

## 2017-09-26 MED FILL — FREESTYLE LITE METER: 30 days supply | Qty: 1 | Fill #0

## 2017-09-26 NOTE — Patient Instructions (Addendum)
Thank you for coming to the clinic today. It was a pleasure to see you.   For your diabetes, please increase continue taking the Jardiance 10 mg daily, metformin 2000 mg daily, NovoLog 10 units 3 times daily. Start taking Levemir 40 units daily (this is increased from 37 units daily). We have provided you a new meter today, please bring this with you to your next office visit.   I have referred you to an eye doctor, please call our clinic and request an update if you have not heard something back about scheduling this within the next 2 weeks   For your history of COPD - I have referred you for Pulmonary function testing to make sure that you are on the correct medications for this.   For your dizziness, please decrease the metoprolol to 12.5 mg twice daily. We will recheck your blood pressure at your next office visit   For your high cholesterol please start taking lovastatin 40 mg daily  FOLLOW-UP INSTRUCTIONS When: 3 months with Dr. Obie Dredge   For: Follow up of your diabetes  What to bring: all of your medication bottles   Please call our clinic if you have any questions or concerns, we may be able to help and keep you from a long and expensive emergency room wait. Our clinic and after hours phone number is [phone], there is always someone available. If you need medication refills please notify your pharmacy one week in advance and they will send [place] a request.

## 2017-09-26 NOTE — Progress Notes (Signed)
CC: follow up of diabetes   HPI:  KarenKaren [name] is a 42 y.o. COPD, GERD, DM, HTN, hypothyroid, manic depression, and OSA (on CPAP) who presents for follow up of diabetes, dizziness, and COPD. Please see the assessment and plans for the status of the patient chronic medical problems.    Past Medical History:  Diagnosis Date  . Chest pain 08/02/2017  . COPD (chronic obstructive pulmonary disease) (HCC)   . Diabetes mellitus without complication (HCC)   . Fibromyalgia syndrome 04/17/2017  . Generalized abdominal pain 08/02/2017  . GERD (gastroesophageal reflux disease) 04/17/2017  . Hypertension   . Hypothyroidism 04/17/2017  . Left foot pain 06/21/2017  . 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 Thyroid disease   . Viral URI with cough 04/28/2017   Review of Systems:  Refer to history of present illness and assessment and plans for pertinent review of systems, all others reviewed and negative  Physical Exam:  Vitals:   09/26/17 1420  BP: 116/68  Pulse: (!) 104  Temp: 99 F (37.2 C)  TempSrc: Oral  SpO2: 98%  Weight: 271 lb 14.4 oz (123.3 kg)  Height:  (1.702 m)   General: Well-appearing, no acute distress Cardiac: Regular rate and rhythm, no murmurs, no peripheral edema Pulmonary: Normal work of breathing, lungs clear to auscultation GI: The abdomen is soft, nontender, nondistended  Assessment & Plan:   Diabetes  A1c was 9 when checked in 3 months ago.  She was started on Jardiance 10 mg daily and told to continue taking metformin 2000 mg daily, NovoLog 10 units 3 times daily, and Levemir 37 units daily. She does not have a blood glucose meter. Denies symptoms of hypoglycemia.  - Karen [name] says that her insurance will not cover the cost of a meter so Karen [name] contacted the [facility] outpatient pharmacy to see if there is any meter they would be willing to provide her for free and a prescription was placed  - Follow up A1c  today 7.9 - congratulated on success - continue metformin 2000 mg qd, jardiance 10 mg qd, novolog 10 units TID - increase levemir to 40 units qd  - encouraged increase in activity level  -Due for diabetic eye exam - referral placed for ophthalmology  -Foot examined today - normal sensation, no concerning skin changes  - provided PPSV 23 today  - no protein detected on last urinalysis, she is on lisinopril - no recent lipid panel however ASCVD is consistent with moderately elevated risk (< 7.5%) related to hypertension, tobacco use independent of lipid level currently she is on lovastatin 10 mg qd will increase this to 40 mg daily  - encouraged tobacco cessation   Hypertension  Pressures well controlled today 116/68, this is improved from last OV when BP was 142/80 and amlodipine was increased from 5 mg daily to 10 mg daily. Orthostatics positive (BP 121/66 heart rate 89 lying down to blood pressure 116/74 with heart rate 98 sitting to blood pressure 124/68 with heart rate 106 standing after 2 minutes) she also describes dizziness especially after getting out of a warm shower.  She does not have known coronary artery disease or congestive heart failure so I will start a taper of the metoprolol today. -Continue amlodipine 10 mg daily, lisinopril 40 mg daily - taper metoprolol 25 mg twice daily to 12.5 mg BID   Hypothyroidism  11/18 TSH 1.14, T4 2.06. Reports good compliance with synthroid. Does have  fatigue and generalized pain but there are other diagnosis which these are more likely related to  - Repeat TSH testing today shows that TSH is within the normal range (1.15)  - synthroid 150 mg qd   ?COPD  Describes shortness of breath with exertion, no cough. No history of COPD exacerbation that she can remember in the past.  No prior pulmonary function tests in our system and she is just using an albuterol inhaler at this time and has been using Qvar in the past.  I believe an evaluation of her  pulmonary function would be helpful in guiding our treatment of her symptoms - Referral for pulmonary function testing  OSA  Found on Sleep study 07/2017 she had mild sleep apnea and was prescribed CPAP which she has been compliant with.  - continue CPAP   Fibromyalgia  Reports the symptoms of this are poorly controlled. She had generalized pain mostly in the upper extremities and general feeling of unwell and fatigue.  - encouraged very gradual increase in activity level, she does not have much activity in her day at all so she will start with 10 minutes of walking on the treadmill daily with gradual increase only in the duration of exercise not intensity for now - continue CBT ( this is first line)  - continue to use CPAP and focus on sleep  - reinforced planning of activities to not overdo it on good days or under do it on bad days  - continue gabapentin 300 mg BID   Manic depression  Urgent referral to Psych placed at last OV for PHQ 25. Started on abilify, gabapentin, and depakote. PHQ today 24. She likes [facility] and would like to continue following there.  - continue to follow with [facility]   See Encounters Tab for problem based charting.  Patient discussed with Dr. Cleda Daub

## 2017-09-26 NOTE — Assessment & Plan Note (Addendum)
Found on Sleep study 07/2017 she had mild sleep apnea and was prescribed CPAP which she has been compliant with.  - continue CPAP

## 2017-09-27 ENCOUNTER — Encounter: Payer: Self-pay | Admitting: Internal Medicine

## 2017-09-27 LAB — TSH: TSH: 1.15 u[IU]/mL (ref 0.450–4.500)

## 2017-09-27 MED ORDER — LOVASTATIN 40 MG PO TABS: 40.0000 mg | ORAL_TABLET | Freq: Every day | ORAL | 11 refills | Status: DC

## 2017-09-27 NOTE — Assessment & Plan Note (Signed)
Describes shortness of breath with exertion, no cough. No history of COPD exacerbation that she can remember in the past.  No prior pulmonary function tests in our system and she is just using an albuterol inhaler at this time and has been using Qvar in the past.  I believe an evaluation of her pulmonary function would be helpful in guiding our treatment of her symptoms - Referral for pulmonary function testing

## 2017-09-27 NOTE — Assessment & Plan Note (Signed)
A1c was 9 when checked in 3 months ago.  She was started on Jardiance 10 mg daily and told to continue taking metformin 2000 mg daily, NovoLog 10 units 3 times daily, and Levemir 37 units daily. She does not have a blood glucose meter. Denies symptoms of hypoglycemia.  - Karen [name] says that her insurance will not cover the cost of a meter so Karen [name] contacted the [place] outpatient pharmacy to see if there is any meter they would be willing to provide her for free and a prescription was placed  - Follow up A1c today 7.9 - congratulated on success - continue metformin 2000 mg qd, jardiance 10 mg qd, novolog 10 units TID - increase levemir to 40 units qd  - encouraged increase in activity level  -Due for diabetic eye exam - referral placed for ophthalmology  -Foot examined today - normal sensation, no concerning skin changes  - provided PPSV 23 today  - no protein detected on last urinalysis, she is on lisinopril - no recent lipid panel however ASCVD is consistent with moderately elevated risk (< 7.5%) related to hypertension, tobacco use independent of lipid level currently she is on lovastatin 10 mg qd will increase this to 40 mg daily  - encouraged tobacco cessation

## 2017-09-27 NOTE — Assessment & Plan Note (Signed)
Urgent referral to Psych placed at last OV for PHQ 25. Started on abilify, gabapentin, and depakote. PHQ today 24. She likes [facility] and would like to continue following there.  - continue to follow with [facility]

## 2017-09-27 NOTE — Assessment & Plan Note (Signed)
Reports the symptoms of this are poorly controlled. She had generalized pain mostly in the upper extremities and general feeling of unwell and fatigue.  - encouraged very gradual increase in activity level, she does not have much activity in her day at all so she will start with 10 minutes of walking on the treadmill daily with gradual increase only in the duration of exercise not intensity for now - continue CBT ( this is first line)  - continue to use CPAP and focus on sleep  - reinforced planning of activities to not overdo it on good days or under do it on bad days  - continue gabapentin 300 mg BID

## 2017-09-27 NOTE — Progress Notes (Signed)
Internal Medicine Clinic Attending  Case discussed with Dr. Blum at the time of the visit.  We reviewed the resident's history and exam and pertinent patient test results.  I agree with the assessment, diagnosis, and plan of care documented in the resident's note. 

## 2017-09-27 NOTE — Assessment & Plan Note (Signed)
11/18 TSH 1.14, T4 2.06. Reports good compliance with synthroid. Does have fatigue and generalized pain but there are other diagnosis which these are more likely related to  - Repeat TSH testing today shows that TSH is within the normal range (1.15)  - synthroid 150 mg qd

## 2017-09-27 NOTE — Assessment & Plan Note (Signed)
Pressures well controlled today 116/68, this is improved from last OV when BP was 142/80 and amlodipine was increased from 5 mg daily to 10 mg daily. Orthostatics positive (BP 121/66 heart rate 89 lying down to blood pressure 116/74 with heart rate 98 sitting to blood pressure 124/68 with heart rate 106 standing after 2 minutes) she also describes dizziness especially after getting out of a warm shower.  She does not have known coronary artery disease or congestive heart failure so I will start a taper of the metoprolol today. -Continue amlodipine 10 mg daily, lisinopril 40 mg daily - taper metoprolol 25 mg twice daily to 12.5 mg BID

## 2017-10-03 ENCOUNTER — Encounter: Payer: Self-pay | Admitting: *Deleted
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
## 2017-10-06 ENCOUNTER — Encounter

## 2017-10-06 ENCOUNTER — Ambulatory Visit (INDEPENDENT_AMBULATORY_CARE_PROVIDER_SITE_OTHER): Admitting: Physician Assistant

## 2017-10-06 ENCOUNTER — Encounter: Payer: Self-pay | Admitting: Physician Assistant

## 2017-10-06 VITALS — BP 120/78 | HR 96 | Ht 67.0 in | Wt 271.0 lb

## 2017-10-06 DIAGNOSIS — R55 Syncope and collapse: Secondary | ICD-10-CM

## 2017-10-06 DIAGNOSIS — I10 Essential (primary) hypertension: Secondary | ICD-10-CM | POA: Diagnosis not present

## 2017-10-06 DIAGNOSIS — R079 Chest pain, unspecified: Secondary | ICD-10-CM

## 2017-10-06 DIAGNOSIS — E039 Hypothyroidism, unspecified: Secondary | ICD-10-CM | POA: Diagnosis not present

## 2017-10-06 NOTE — Patient Instructions (Signed)
Medication Instructions:  Your physician recommends that you continue on your current medications as directed. Please refer to the Current Medication list given to you today.   Labwork: -None  Testing/Procedures: Your physician has requested that you have en exercise stress myoview. For further information please visit [email or web address] Please follow instruction sheet, as given.  Your physician has requested that you have an echocardiogram. Echocardiography is a painless test that uses sound waves to create images of your heart. It provides your doctor with information about the size and shape of your heart and how well your heart's chambers and valves are working. This procedure takes approximately one hour. There are no restrictions for this procedure.  Your physician has recommended that you wear an event monitor. Event monitors are medical devices that record the heart's electrical activity. Doctors most often [place] these monitors to diagnose arrhythmias. Arrhythmias are problems with the speed or rhythm of the heartbeat. The monitor is a small, portable device. You can wear one while you do your normal daily activities. This is usually used to diagnose what is causing palpitations/syncope (passing out).    Follow-Up: Your physician recommends that you keep your scheduled  follow-up appointment with Ronie Spies, PA   Please try compression hose for dizziness you can get these at [facility].   Any Other Special Instructions Will Be Listed Below (If Applicable). You are scheduled for a Myocardial Perfusion Imaging Study on Tuesday, May 21 at 12:45 pm and  Wednesday, May 22 at 12:30 pm.   Please arrive 15 minutes prior to your appointment time for registration and insurance purposes.   The test will take approximately 3 to 4 hours to complete; you may bring reading material. If someone comes with you to your appointment, they will need to remain in the main lobby due to limited space in the  testing area.   If you are pregnant or breastfeeding, please notify the nuclear lab prior to your appointment.   How to prepare for your Myocardial Perfusion test:   Do not eat or drink 3 hours prior to your test, except you may have water.    Do not consume products containing caffeine (regular or decaffeinated) 12 hours prior to your test (ex: coffee, chocolate, soda, tea)  Hold metoprolol the am of test both days    Do bring a list of your current medications with you. If not listed below, you may take your medications as normal.     Bring any held medication to your appointment, as you may be required to take it once the test is complete.   Do wear comfortable clothes (no dresses or overalls) and walking shoes. Tennis shoes are preferred. No heels or open toed shoes.  Do not wear cologne, perfume, aftershave or lotions (deodorant is allowed).   If these instructions are not followed, you test will have to be rescheduled.   Please report to [address] for your test. If you have questions or concerns about your appointment, please call the Nuclear Lab at #[phone].  If you cannot keep your appointment, please provide 24 hour notification to the Nuclear lab to avoid a possible $50 charge to your account.         If you need a refill on your cardiac medications before your next appointment, please call your pharmacy.

## 2017-10-11 ENCOUNTER — Ambulatory Visit (HOSPITAL_COMMUNITY): Attending: Cardiovascular Disease

## 2017-10-11 ENCOUNTER — Other Ambulatory Visit: Payer: Self-pay

## 2017-10-11 DIAGNOSIS — Z8249 Family history of ischemic heart disease and other diseases of the circulatory system: Secondary | ICD-10-CM | POA: Diagnosis not present

## 2017-10-11 DIAGNOSIS — G4733 Obstructive sleep apnea (adult) (pediatric): Secondary | ICD-10-CM | POA: Insufficient documentation

## 2017-10-11 DIAGNOSIS — E669 Obesity, unspecified: Secondary | ICD-10-CM | POA: Diagnosis not present

## 2017-10-11 DIAGNOSIS — I10 Essential (primary) hypertension: Secondary | ICD-10-CM | POA: Insufficient documentation

## 2017-10-11 DIAGNOSIS — J449 Chronic obstructive pulmonary disease, unspecified: Secondary | ICD-10-CM | POA: Insufficient documentation

## 2017-10-11 DIAGNOSIS — R079 Chest pain, unspecified: Secondary | ICD-10-CM | POA: Diagnosis not present

## 2017-10-11 DIAGNOSIS — Z6841 Body Mass Index (BMI) 40.0 and over, adult: Secondary | ICD-10-CM | POA: Insufficient documentation

## 2017-10-11 DIAGNOSIS — R55 Syncope and collapse: Secondary | ICD-10-CM | POA: Insufficient documentation

## 2017-10-11 DIAGNOSIS — E119 Type 2 diabetes mellitus without complications: Secondary | ICD-10-CM | POA: Insufficient documentation

## 2017-10-11 DIAGNOSIS — F1010 Alcohol abuse, uncomplicated: Secondary | ICD-10-CM | POA: Insufficient documentation

## 2017-10-13 ENCOUNTER — Telehealth: Payer: Self-pay | Admitting: Physician Assistant

## 2017-10-13 NOTE — Telephone Encounter (Signed)
Notes recorded by Laurann Montana, PA-C on 10/12/2017 at 7:50 AM EDT Please let pt know echo generally reassuring - EF normal. Does show stiffening of heart muscle likely related to obesity - weight loss is of utmost importance. Would advise to generally stick to lower sodium diet (aiming for maximum 2,000mg  per day) and staying hydrated but not to excess >2L per day of total fluid intake. Dayna Dunn PA-C

## 2017-10-13 NOTE — Telephone Encounter (Signed)
New message:       Pt is returning a call for her echocardiogram results

## 2017-10-13 NOTE — Telephone Encounter (Signed)
Reviewed echo results and recommendations with patient who states understanding.  All questions answered and she will f/u as instructed.

## 2017-10-15 ENCOUNTER — Encounter: Payer: Self-pay | Admitting: Neurology

## 2017-10-17 ENCOUNTER — Ambulatory Visit (HOSPITAL_COMMUNITY): Attending: Cardiovascular Disease

## 2017-10-17 VITALS — Ht 67.0 in | Wt 271.0 lb

## 2017-10-17 DIAGNOSIS — R079 Chest pain, unspecified: Secondary | ICD-10-CM | POA: Diagnosis not present

## 2017-10-17 MED ORDER — TECHNETIUM TC 99M TETROFOSMIN IV KIT
33.0000 | PACK | Freq: Once | INTRAVENOUS | Status: AC | PRN
  Administered 2017-10-17: 33 via INTRAVENOUS
  Filled 2017-10-17: qty 33

## 2017-10-18 ENCOUNTER — Ambulatory Visit (INDEPENDENT_AMBULATORY_CARE_PROVIDER_SITE_OTHER)

## 2017-10-18 ENCOUNTER — Ambulatory Visit (HOSPITAL_COMMUNITY): Attending: Cardiology

## 2017-10-18 DIAGNOSIS — R55 Syncope and collapse: Secondary | ICD-10-CM

## 2017-10-18 DIAGNOSIS — I10 Essential (primary) hypertension: Secondary | ICD-10-CM

## 2017-10-18 DIAGNOSIS — R079 Chest pain, unspecified: Secondary | ICD-10-CM

## 2017-10-18 LAB — MYOCARDIAL PERFUSION IMAGING
CHL CUP NUCLEAR SDS: 0
CHL CUP NUCLEAR SRS: 0
CSEPED: 6 min
CSEPEW: 7.2 METS
CSEPPHR: 155 {beats}/min
LHR: 0.35
LV dias vol: 102 mL (ref 46–106)
LV sys vol: 37 mL
MPHR: 178 {beats}/min
Percent HR: 87 %
RPE: 19
Rest HR: 81 {beats}/min
SSS: 0
TID: 0.95

## 2017-10-18 MED ORDER — TECHNETIUM TC 99M TETROFOSMIN IV KIT
30.4000 | PACK | Freq: Once | INTRAVENOUS | Status: AC | PRN
  Administered 2017-10-18: 30.4 via INTRAVENOUS
  Filled 2017-10-18: qty 31

## 2017-10-18 NOTE — Progress Notes (Signed)
Pt has been made aware of normal result and verbalized understanding.  jw 10/18/17

## 2017-10-23 ENCOUNTER — Encounter: Payer: Self-pay | Admitting: *Deleted

## 2017-11-03 ENCOUNTER — Ambulatory Visit

## 2017-11-03 NOTE — Addendum Note (Signed)
Addended by: Neomia DearPOWERS, Simren Popson E on: 11/03/2017 04:58 PM   Modules accepted: Orders

## 2017-11-14 ENCOUNTER — Ambulatory Visit (INDEPENDENT_AMBULATORY_CARE_PROVIDER_SITE_OTHER): Admitting: Internal Medicine

## 2017-11-14 ENCOUNTER — Other Ambulatory Visit: Payer: Self-pay

## 2017-11-14 ENCOUNTER — Encounter: Payer: Self-pay | Admitting: Internal Medicine

## 2017-11-14 VITALS — BP 119/69 | HR 87 | Temp 98.7°F | Ht 67.0 in

## 2017-11-14 DIAGNOSIS — I110 Hypertensive heart disease with heart failure: Secondary | ICD-10-CM

## 2017-11-14 DIAGNOSIS — Z794 Long term (current) use of insulin: Secondary | ICD-10-CM

## 2017-11-14 DIAGNOSIS — J449 Chronic obstructive pulmonary disease, unspecified: Secondary | ICD-10-CM

## 2017-11-14 DIAGNOSIS — R300 Dysuria: Secondary | ICD-10-CM

## 2017-11-14 DIAGNOSIS — F309 Manic episode, unspecified: Secondary | ICD-10-CM | POA: Diagnosis not present

## 2017-11-14 DIAGNOSIS — F17211 Nicotine dependence, cigarettes, in remission: Secondary | ICD-10-CM | POA: Diagnosis not present

## 2017-11-14 DIAGNOSIS — G4733 Obstructive sleep apnea (adult) (pediatric): Secondary | ICD-10-CM

## 2017-11-14 DIAGNOSIS — E1159 Type 2 diabetes mellitus with other circulatory complications: Secondary | ICD-10-CM

## 2017-11-14 DIAGNOSIS — E0800 Diabetes mellitus due to underlying condition with hyperosmolarity without nonketotic hyperglycemic-hyperosmolar coma (NKHHC): Secondary | ICD-10-CM | POA: Diagnosis not present

## 2017-11-14 DIAGNOSIS — Z87442 Personal history of urinary calculi: Secondary | ICD-10-CM

## 2017-11-14 DIAGNOSIS — Z79899 Other long term (current) drug therapy: Secondary | ICD-10-CM

## 2017-11-14 DIAGNOSIS — I5030 Unspecified diastolic (congestive) heart failure: Secondary | ICD-10-CM | POA: Insufficient documentation

## 2017-11-14 DIAGNOSIS — Z720 Tobacco use: Secondary | ICD-10-CM

## 2017-11-14 DIAGNOSIS — N390 Urinary tract infection, site not specified: Secondary | ICD-10-CM | POA: Diagnosis not present

## 2017-11-14 DIAGNOSIS — Z87440 Personal history of urinary (tract) infections: Secondary | ICD-10-CM

## 2017-11-14 DIAGNOSIS — K219 Gastro-esophageal reflux disease without esophagitis: Secondary | ICD-10-CM

## 2017-11-14 DIAGNOSIS — Z9989 Dependence on other enabling machines and devices: Secondary | ICD-10-CM

## 2017-11-14 DIAGNOSIS — E039 Hypothyroidism, unspecified: Secondary | ICD-10-CM

## 2017-11-14 DIAGNOSIS — I10 Essential (primary) hypertension: Secondary | ICD-10-CM

## 2017-11-14 DIAGNOSIS — I5032 Chronic diastolic (congestive) heart failure: Secondary | ICD-10-CM

## 2017-11-14 LAB — POCT GLYCOSYLATED HEMOGLOBIN (HGB A1C): HEMOGLOBIN A1C: 7.4 % — AB (ref 4.0–5.6)

## 2017-11-14 LAB — POCT URINALYSIS DIPSTICK
Bilirubin, UA: NEGATIVE
Glucose, UA: POSITIVE — AB
Protein, UA: POSITIVE — AB
Spec Grav, UA: 1.015 (ref 1.010–1.025)
UROBILINOGEN UA: 0.2 U/dL
pH, UA: 5.5 (ref 5.0–8.0)

## 2017-11-14 LAB — GLUCOSE, CAPILLARY: GLUCOSE-CAPILLARY: 143 mg/dL — AB (ref 65–99)

## 2017-11-14 MED ORDER — NITROFURANTOIN MONOHYD MACRO 100 MG PO CAPS: 100.0000 mg | ORAL_CAPSULE | Freq: Two times a day (BID) | ORAL | 0 refills | Status: DC

## 2017-11-14 NOTE — Patient Instructions (Addendum)
Thank you for coming to the clinic today. It was a pleasure to see you.   For your hypertension  - decrease the metoprolol to 6.25 mg twice daily   For your diabetes and diet  - I have referred you to our diabetes coordinator who can help you with diet recommendations  - call the front desk to coordinate your eye exam   It looks like you do have a urinary tract infection today - Take macrobid twice daily for 5 days total   FOLLOW-UP INSTRUCTIONS When: 3 months with Dr. Obie DredgeBlum   For: follow up of your diabetes  What to bring: all of your medication bottles   Please call our clinic if you have any questions or concerns, we may be able to help and keep you from a long and expensive emergency room wait. Our clinic and after hours phone number is [phone], the best time to call is Monday through Friday 9 am to 4 pm but there is always someone available 24/7 if you have an emergency. If you need medication refills please notify your pharmacy one week in advance and they will send us a request.

## 2017-11-14 NOTE — Progress Notes (Signed)
   CC: burning with urination  HPI:  [name] is a 42 y.o. with PMH COPD, GERD, DM, HTN, hypothyroid,manicdepression, and OSA (on CPAP), HFpEF ( echo 09/2017 with EF 60-65%, grade 2 diastolic dysfunction)  who presents for work-up of burning with urination. Please see the assessment and plans for the status of the patient chronic medical problems.   Review of Systems:  Refer to history of present illness and assessment and plans for pertinent review of systems, all others reviewed and negative  Physical Exam:  Vitals:   11/14/17 1331  BP: 119/69  Pulse: 87  Temp: 98.7 F (37.1 C)  TempSrc: Oral  SpO2: 97%  Height: 5\' 7"  (1.702 m)   Neuro: Well-appearing, no acute distress Cardiac: Regular rate and rhythm, no murmur appreciated, no peripheral edema Pulmonary: Normal work of breathing, lungs clear to auscultation GI: Abdomen is soft, nontender, nondistended, no suprapubic or CVA tenderness  Assessment & Plan:   Dysuria She reports 1 day of dysuria, denies increased frequency of urination, fever, gross hematuria.  She has been using Pyridium.  She has a history of UTIs and kidney stones however this feels more like a UTI. -Analysis with leukocytes and nitrites in the setting of symptoms is consistent with urinary tract infection -Start a 5-day course of Macrobid 100 mg twice daily  Hypertension  Blood pressure well controlled today.  Orthostatic hypotension have improved since metoprolol taper was started at last office visit. Will continue with plan to taper off metoprolol he does not have cardiac disease necessitating this. -Continue amlodipine 10 mg qd, lisinopril 40 mg qd - decrease metoprolol to 6.25 mg BID  DM  A1c 7.9 at last office visit two months ago. A free meter was provided at the time and she was asked to increase levemir to 40 units daily.  She forgot her meter today. - A1c today to 7.4 - continue metformin 2000 mg qd, jardiance 10 mg qd, novolog 10 units  TID, levemir to 40 units qd  -She asked a lot of good questions on healthier eating and states that she would like to meet with a dietitian, will refer her to Norm Parcelonna Plyler - eye exam ordered at last office visit, she is coordinating this with the front desk -Encouraged to bring glucose meter to next office visit for management of insulin  History of tobacco use Previously smoking 1 pack/day for the past 2 years.  She says that she quit 1 month ago - Congratulated on success we will continue to monitor  Preventative health Declines Pap smear today but will consider at next office visit  See Encounters Tab for problem based charting.  Patient discussed with Dr. Criselda PeachesMullen

## 2017-11-15 ENCOUNTER — Encounter: Payer: Self-pay | Admitting: Internal Medicine

## 2017-11-15 DIAGNOSIS — R300 Dysuria: Secondary | ICD-10-CM | POA: Insufficient documentation

## 2017-11-15 DIAGNOSIS — Z720 Tobacco use: Secondary | ICD-10-CM | POA: Insufficient documentation

## 2017-11-15 HISTORY — DX: Tobacco use: Z72.0

## 2017-11-15 NOTE — Assessment & Plan Note (Signed)
Previously smoking 1 pack/day for the past 2 years.  She says that she quit 1 month ago - Congratulated on success we will continue to monitor

## 2017-11-15 NOTE — Assessment & Plan Note (Signed)
Blood pressure well controlled today.  Orthostatic hypotension have improved since metoprolol taper was started at last office visit. Will continue with plan to taper off metoprolol he does not have cardiac disease necessitating this. -Continue amlodipine 10 mg qd, lisinopril 40 mg qd - decrease metoprolol to 6.25 mg BID

## 2017-11-15 NOTE — Assessment & Plan Note (Signed)
She reports 1 day of dysuria, denies increased frequency of urination, fever, gross hematuria.  She has been using Pyridium.  She has a history of UTIs and kidney stones however this feels more like a UTI. -Analysis with leukocytes and nitrites in the setting of symptoms is consistent with urinary tract infection -Start a 5-day course of Macrobid 100 mg twice daily

## 2017-11-15 NOTE — Assessment & Plan Note (Signed)
A1c 7.9 at last office visit two months ago. A free meter was provided at the time and she was asked to increase levemir to 40 units daily.  She forgot her meter today. - A1c today to 7.4 - continue metformin 2000 mg qd, jardiance 10 mg qd, novolog 10 units TID, levemir to 40 units qd  -She asked a lot of good questions on healthier eating and states that she would like to meet with a dietitian, will refer her to Norm Parcelonna Plyler - eye exam ordered at last office visit, she is coordinating this with the front desk -Encouraged to bring glucose meter to next office visit for management of insulin

## 2017-11-20 NOTE — Progress Notes (Signed)
Internal Medicine Clinic Attending  Case discussed with Dr. Blum at the time of the visit.  We reviewed the resident's history and exam and pertinent patient test results.  I agree with the assessment, diagnosis, and plan of care documented in the resident's note. 

## 2017-12-03 ENCOUNTER — Encounter: Payer: Self-pay | Admitting: Physician Assistant
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
## 2017-12-04 ENCOUNTER — Encounter: Payer: Self-pay | Admitting: Physician Assistant

## 2017-12-04 ENCOUNTER — Encounter: Payer: Self-pay | Admitting: Dietician

## 2017-12-04 ENCOUNTER — Ambulatory Visit (INDEPENDENT_AMBULATORY_CARE_PROVIDER_SITE_OTHER): Admitting: Dietician

## 2017-12-04 ENCOUNTER — Ambulatory Visit (INDEPENDENT_AMBULATORY_CARE_PROVIDER_SITE_OTHER): Admitting: Physician Assistant

## 2017-12-04 VITALS — BP 112/70 | HR 95 | Ht 67.0 in | Wt 277.0 lb

## 2017-12-04 DIAGNOSIS — R55 Syncope and collapse: Secondary | ICD-10-CM

## 2017-12-04 DIAGNOSIS — R0609 Other forms of dyspnea: Secondary | ICD-10-CM | POA: Diagnosis not present

## 2017-12-04 DIAGNOSIS — R0789 Other chest pain: Secondary | ICD-10-CM | POA: Diagnosis not present

## 2017-12-04 DIAGNOSIS — E1143 Type 2 diabetes mellitus with diabetic autonomic (poly)neuropathy: Secondary | ICD-10-CM

## 2017-12-04 DIAGNOSIS — I10 Essential (primary) hypertension: Secondary | ICD-10-CM | POA: Diagnosis not present

## 2017-12-04 DIAGNOSIS — K3184 Gastroparesis: Secondary | ICD-10-CM | POA: Diagnosis not present

## 2017-12-04 DIAGNOSIS — Z713 Dietary counseling and surveillance: Secondary | ICD-10-CM | POA: Diagnosis not present

## 2017-12-04 DIAGNOSIS — E118 Type 2 diabetes mellitus with unspecified complications: Secondary | ICD-10-CM

## 2017-12-04 DIAGNOSIS — Z794 Long term (current) use of insulin: Secondary | ICD-10-CM

## 2017-12-04 LAB — GLUCOSE, CAPILLARY: Glucose-Capillary: 233 mg/dL — ABNORMAL HIGH (ref 70–99)

## 2017-12-04 NOTE — Patient Instructions (Addendum)
Limit added sugar to 5 teaspoons a day. Each teaspoon has 4 grams carb so 5 x 4= limit to 20 grams sugar /day..   Slowly increase your fiber intake to 25 grams fiber a day as we discussed.     Calorie Counting for Weight Loss Calories are units of energy. Your body needs a certain amount of calories from food to keep you going throughout the day. When you eat more calories than your body needs, your body stores the extra calories as fat. When you eat fewer calories than your body needs, your body burns fat to get the energy it needs. Calorie counting means keeping track of how many calories you eat and drink each day. Calorie counting can be helpful if you need to lose weight. If you make sure to eat fewer calories than your body needs, you should lose weight. Ask your health care provider what a healthy weight is for you. For calorie counting to work, you will need to eat the right number of calories in a day in order to lose a healthy amount of weight per week. A dietitian can help you determine how many calories you need in a day and will give you suggestions on how to reach your calorie goal.  A healthy amount of weight to lose per week is usually 1-2 lb (0.5-0.9 kg). This usually means that your daily calorie intake should be reduced by 500-750 calories.  Eating 1,200 - 1,500 calories per day can help most women lose weight.  Eating 1,500 - 1,800 calories per day can help most men lose weight.  What is my plan? My goal is to have __________ calories per day. If I have this many calories per day, I should lose around __________ pounds per week. What do I need to know about calorie counting? In order to meet your daily calorie goal, you will need to:  Find out how many calories are in each food you would like to eat. Try to do this before you eat.  Decide how much of the food you plan to eat.  Write down what you ate and how many calories it had. Doing this is called keeping a food  log.  To successfully lose weight, it is important to balance calorie counting with a healthy lifestyle that includes regular activity. Aim for 150 minutes of moderate exercise (such as walking) or 75 minutes of vigorous exercise (such as running) each week. Where do I find calorie information?  The number of calories in a food can be found on a Nutrition Facts label. If a food does not have a Nutrition Facts label, try to look up the calories online or ask your dietitian for help. Remember that calories are listed per serving. If you choose to have more than one serving of a food, you will have to multiply the calories per serving by the amount of servings you plan to eat. For example, the label on a package of bread might say that a serving size is 1 slice and that there are 90 calories in a serving. If you eat 1 slice, you will have eaten 90 calories. If you eat 2 slices, you will have eaten 180 calories. How do I keep a food log? Immediately after each meal, record the following information in your food log:  What you ate. Don't forget to include toppings, sauces, and other extras on the food.  How much you ate. This can be measured in cups, ounces, or number  of items.  How many calories each food and drink had.  The total number of calories in the meal.  Keep your food log near you, such as in a small notebook in your pocket, or use a mobile app or website. Some programs will calculate calories for you and show you how many calories you have left for the day to meet your goal. What are some calorie counting tips?  Use your calories on foods and drinks that will fill you up and not leave you hungry: ? Some examples of foods that fill you up are nuts and nut butters, vegetables, lean proteins, and high-fiber foods like whole grains. High-fiber foods are foods with more than 5 g fiber per serving. ? Drinks such as sodas, specialty coffee drinks, alcohol, and juices have a lot of calories,  yet do not fill you up.  Eat nutritious foods and avoid empty calories. Empty calories are calories you get from foods or beverages that do not have many vitamins or protein, such as candy, sweets, and soda. It is better to have a nutritious high-calorie food (such as an avocado) than a food with few nutrients (such as a bag of chips).  Know how many calories are in the foods you eat most often. This will help you calculate calorie counts faster.  Pay attention to calories in drinks. Low-calorie drinks include water and unsweetened drinks.  Pay attention to nutrition labels for "low fat" or "fat free" foods. These foods sometimes have the same amount of calories or more calories than the full fat versions. They also often have added sugar, starch, or salt, to make up for flavor that was removed with the fat.  Find a way of tracking calories that works for you. Get creative. Try different apps or programs if writing down calories does not work for you. What are some portion control tips?  Know how many calories are in a serving. This will help you know how many servings of a certain food you can have.  Use a measuring cup to measure serving sizes. You could also try weighing out portions on a kitchen scale. With time, you will be able to estimate serving sizes for some foods.  Take some time to put servings of different foods on your favorite plates, bowls, and cups so you know what a serving looks like.  Try not to eat straight from a bag or box. Doing this can lead to overeating. Put the amount you would like to eat in a cup or on a plate to make sure you are eating the right portion.  Use smaller plates, glasses, and bowls to prevent overeating.  Try not to multitask (for example, watch TV or use your computer) while eating. If it is time to eat, sit down at a table and enjoy your food. This will help you to know when you are full. It will also help you to be aware of what you are eating and  how much you are eating. What are tips for following this plan? Reading food labels  Check the calorie count compared to the serving size. The serving size may be smaller than what you are used to eating.  Check the source of the calories. Make sure the food you are eating is high in vitamins and protein and low in saturated and trans fats. Shopping  Read nutrition labels while you shop. This will help you make healthy decisions before you decide to purchase your food.  Make  a grocery list and stick to it. Cooking  Try to cook your favorite foods in a healthier way. For example, try baking instead of frying.  Use low-fat dairy products. Meal planning  Use more fruits and vegetables. Half of your plate should be fruits and vegetables.  Include lean proteins like poultry and fish. How do I count calories when eating out?  Ask for smaller portion sizes.  Consider sharing an entree and sides instead of getting your own entree.  If you get your own entree, eat only half. Ask for a box at the beginning of your meal and put the rest of your entree in it so you are not tempted to eat it.  If calories are listed on the menu, choose the lower calorie options.  Choose dishes that include vegetables, fruits, whole grains, low-fat dairy products, and lean protein.  Choose items that are boiled, broiled, grilled, or steamed. Stay away from items that are buttered, battered, fried, or served with cream sauce. Items labeled "crispy" are usually fried, unless stated otherwise.  Choose water, low-fat milk, unsweetened iced tea, or other drinks without added sugar. If you want an alcoholic beverage, choose a lower calorie option such as a glass of wine or light beer.  Ask for dressings, sauces, and syrups on the side. These are usually high in calories, so you should limit the amount you eat.  If you want a salad, choose a garden salad and ask for grilled meats. Avoid extra toppings like bacon,  cheese, or fried items. Ask for the dressing on the side, or ask for olive oil and vinegar or lemon to use as dressing.  Estimate how many servings of a food you are given. For example, a serving of cooked rice is  cup or about the size of half a baseball. Knowing serving sizes will help you be aware of how much food you are eating at restaurants. The list below tells you how big or small some common portion sizes are based on everyday objects: ? 1 oz-4 stacked dice. ? 3 oz-1 deck of cards. ? 1 tsp-1 die. ? 1 Tbsp- a ping-pong ball. ? 2 Tbsp-1 ping-pong ball. ?  cup- baseball. ? 1 cup-1 baseball. Summary  Calorie counting means keeping track of how many calories you eat and drink each day. If you eat fewer calories than your body needs, you should lose weight.  A healthy amount of weight to lose per week is usually 1-2 lb (0.5-0.9 kg). This usually means reducing your daily calorie intake by 500-750 calories.  The number of calories in a food can be found on a Nutrition Facts label. If a food does not have a Nutrition Facts label, try to look up the calories online or ask your dietitian for help.  Use your calories on foods and drinks that will fill you up, and not on foods and drinks that will leave you hungry.  Use smaller plates, glasses, and bowls to prevent overeating. This information is not intended to replace advice given to you by your health care provider. Make sure you discuss any questions you have with your health care provider. Document Released: 05/16/2005 Document Revised: 04/15/2016 Document Reviewed: 04/15/2016 Elsevier Interactive Patient Education  [employer].

## 2017-12-04 NOTE — Patient Instructions (Addendum)
Medication Instructions:  Your physician recommends that you continue on your current medications as directed. Please refer to the Current Medication list given to you today.   Labwork: Your physician recommends that you have lab work today: bmet/cbc   Testing/Procedures: Right and Left Heart Cath  Follow-Up: Your physician recommends that you keep your scheduled  follow-up appointment with Ronie Spies, PA   Any Other Special Instructions Will Be Listed Below (If Applicable).  Your provider has recommended a cardiac catherization  You are scheduled for a cardiac catheterization on Thursday, July 11 with Dr. Herbie Baltimore or associate.  Please arrive at the [facility] (Main Entrance) at [facility] at [address], [place] -  2nd Floor Short Stay on Thursday at 8:00 am.    Special note: Every effort is made to have your procedure done on time.   Please understand that emergencies sometimes delay a scheduled   procedure.  No solid feeds after midnight on Wednesday, July 10. You may have clear liquids until 5 am on the day of your procedure.  Please take a baby aspirin (81 mg) on the morning of your procedure. On the morning of your procedure, take your all your medications with a sip of water EXCEPT.      Medications to HOLD - Metformin the am of and 48 hours after the cath.  Hold jaridance the night of Hold Novolog the day of The night before take half dose of Levemir Plan for a one night stay -- bring personal belongings.  Bring a current list of your medications and current insurance cards.  You MUST have a responsible person to drive you home. Someone MUST be with you the first 24 hours after you arrive home or your discharge will be delayed. Wear clothes that are easy to get on and off and wear slip on shoes.    Coronary Angiogram A coronary angiogram, also called coronary angiography, is an X-ray procedure used to look at the arteries in the heart. In this  procedure, a dye (contrast dye) is injected through a long, hollow tube (catheter). The catheter is about the size of a piece of cooked spaghetti and is inserted through your groin, wrist, or arm. The dye is injected into each artery, and X-rays are then taken to show if there is a blockage in the arteries of your heart.  LET [facility] CARE PROVIDER KNOW ABOUT:  Any allergies you have, including allergies to shellfish or contrast dye.    All medicines you are taking, including vitamins, herbs, eye drops, creams, and over-the-counter medicines.    Previous problems you or members of your family have had with the use of anesthetics.    Any blood disorders you have.    Previous surgeries you have had.  History of kidney problems or failure.    Other medical conditions you have.  RISKS AND COMPLICATIONS  Generally, a coronary angiogram is a safe procedure. However, about 1 person out of 1000 can have problems that may include:  Allergic reaction to the dye.  Bleeding/bruising from the access site or other locations.  Kidney injury, especially in people with impaired kidney function.   Stroke (rare).  Heart attack (rare).  Irregular rhythms (rare)  Death (rare)  BEFORE THE PROCEDURE   Do not eat or drink anything after midnight the night before the procedure or as directed by your health care provider.    Ask your health care provider about changing or stopping your regular medicines.  This is especially important if you are taking diabetes medicines or blood thinners.  PROCEDURE  You may be given a medicine to help you relax (sedative) before the procedure. This medicine is given through an intravenous (IV) access tube that is inserted into one of your veins.    The area where the catheter will be inserted will be washed and shaved. This is usually done in the groin but may be done in the fold of your arm (near your elbow) or in the wrist.     A medicine will be given to  numb the area where the catheter will be inserted (local anesthetic).    The health care provider will insert the catheter into an artery. The catheter will be guided by using a special type of X-ray (fluoroscopy) of the blood vessel being examined.    A special dye will then be injected into the catheter, and X-rays will be taken. The dye will help to show where any narrowing or blockages are located in the heart arteries.     AFTER THE PROCEDURE   If the procedure is done through the leg, you will be kept in bed lying flat for several hours. You will be instructed to not bend or cross your legs.  The insertion site will be checked frequently.    The pulse in your feet or wrist will be checked frequently.    Additional blood tests, X-rays, and an electrocardiogram may be done.       If you need a refill on your cardiac medications before your next appointment, please call your pharmacy.

## 2017-12-04 NOTE — Progress Notes (Signed)
Diabetes Self-Management Education  Visit Type: First/Initial  Appt. Start Time: 1330 Appt. End Time: 1425  12/04/2017  [name], identified by name and date of birth, is a 42 y.o. female with a diagnosis of Diabetes: Type 2.  Diagnosed in 2003  ASSESSMENT  Ms. Convey would like help with weight loss, she reports she has gained 30+ pounds in past year while also decreasing her A1C. She used to be on Byetta before moving here and it worked well to lower her blood sugar and weight.   She does not take breakfast or lunch Novolog as directed, only takes it when her blood sugar is high enough. Today she did not have her meter, she reported that her fasting 128 today.  She takes her dinner dose of Novolog as this is her largest meal. She reports being tired of sticking herself all the tine and the weight gain is making her very unhappy.   She recently quit smoking, quit drinking alcohol, moved here about 1 year ago , got married and today got a new job. She feels that she (and her spouse) is snacking more since quitting drinking and smoking.   She is not currently having any problems affording her medications or testing supplies.   She has bowel movements about every 3-4 days.  Takes reglan once per day for gastroparesis.   Lab Results  Component Value Date   HGBA1C 7.4 (A) 11/14/2017    Wt Readings from Last 3 Encounters:  10/17/17 271 lb (122.9 kg)  10/06/17 271 lb (122.9 kg)  09/26/17 271 lb 14.4 oz (123.3 kg)    Diabetes Self-Management Education - 12/04/17 1400      Visit Information   Visit Type  First/Initial      Initial Visit   Diabetes Type  Type 2    Are you currently following a meal plan?  No    Are you taking your medications as prescribed?  No does not take breakfast and lunch novolog often    Date Diagnosed  2003      Health Coping   How would you rate your overall health?  Good      Psychosocial Assessment   Patient Belief/Attitude about Diabetes   Motivated to manage diabetes    Self-care barriers  Lack of material resources;Other (comment) lot's of change recently-job, smoking,marriage, move    Self-management support  Doctor's office;CDE visits    Patient Concerns  Nutrition/Meal planning;Glycemic Control;Weight Control    Special Needs  None    Preferred Learning Style  No preference indicated    Learning Readiness  Ready    How often do you need to have someone help you when you read instructions, pamphlets, or other written materials from your doctor or pharmacy?  2 - Rarely    What is the last grade level you completed in school?  12      Pre-Education Assessment   Patient understands incorporating nutritional management into lifestyle.  Needs Review    Patient understands using medications safely.  Needs Review    Patient understands monitoring blood glucose, interpreting and using results  Needs Review    Patient understands prevention, detection, and treatment of acute complications.  Needs Review    Patient understands how to develop strategies to promote health/change behavior.  Needs Review      Complications   Last HgB A1C per patient/outside source  7.4 %    How often do you check your blood sugar?  1-2 times/day  Fasting Blood glucose range (mg/dL)  [phone]    Postprandial Blood glucose range (mg/dL)  604-540;981-191;>478130-179;180-200;>200    Number of hypoglycemic episodes per month  0      Dietary Intake   Breakfast  cereal, milk    Lunch  snacks    Dinner  largest meal, chicken, beef, pork 1x/month    Beverage(s)  no alcohol, half sweet green tea, water      Exercise   Exercise Type  ADL's;Light (walking / raking leaves)    How many days per week to you exercise?  3 3    How many minutes per day do you exercise?  30    Total minutes per week of exercise  90      Patient Education   Previous Diabetes Education  Yes (please comment)    Nutrition management   Role of diet in the treatment of diabetes and the relationship  between the three main macronutrients and blood glucose level;Food label reading, portion sizes and measuring food.;Other (comment) calorie counting for weight loss    Medications  Taught/reviewed insulin injection, site rotation, insulin storage and needle disposal.;Reviewed patients medication for diabetes, action, purpose, timing of dose and side effects.    Personal strategies to promote health  Helped patient develop diabetes management plan for (enter comment) weight loss and decreased stress      Individualized Goals (developed by patient)   Nutrition  Other (comment) increase fiber slowly while eating about 1800 calories/day      Outcomes   Expected Outcomes  Demonstrated interest in learning. Expect positive outcomes    Future DMSE  2 wks    Program Status  Not Completed       Individualized Plan for Diabetes Self-Management Training:   Learning Objective:  Patient will have a greater understanding of diabetes self-management. Patient education plan is to attend individual and/or group sessions per assessed needs and concerns.   Plan:   Patient Instructions  Limit added sugar to 5 teaspoons a day. Each teaspoon has 4 grams carb so 5 x 4= limit to 20 grams sugar /day..   Slowly increase your fiber intake to 25 grams fiber a day as we discussed.     Calorie Counting for Weight Loss Calories are units of energy. Your body needs a certain amount of calories from food to keep you going throughout the day. When you eat more calories than your body needs, your body stores the extra calories as fat. When you eat fewer calories than your body needs, your body burns fat to get the energy it needs. Calorie counting means keeping track of how many calories you eat and drink each day. Calorie counting can be helpful if you need to lose weight. If you make sure to eat fewer calories than your body needs, you should lose weight. Ask your health care provider what a healthy weight is for  you. For calorie counting to work, you will need to eat the right number of calories in a day in order to lose a healthy amount of weight per week. A dietitian can help you determine how many calories you need in a day and will give you suggestions on how to reach your calorie goal.  A healthy amount of weight to lose per week is usually 1-2 lb (0.5-0.9 kg). This usually means that your daily calorie intake should be reduced by 500-750 calories.  Eating 1,200 - 1,500 calories per day can help most women lose weight.  Eating 1,500 - 1,800 calories per day can help most men lose weight.  What is my plan? My goal is to have __________ calories per day. If I have this many calories per day, I should lose around __________ pounds per week. What do I need to know about calorie counting? In order to meet your daily calorie goal, you will need to:  Find out how many calories are in each food you would like to eat. Try to do this before you eat.  Decide how much of the food you plan to eat.  Write down what you ate and how many calories it had. Doing this is called keeping a food log.  To successfully lose weight, it is important to balance calorie counting with a healthy lifestyle that includes regular activity. Aim for 150 minutes of moderate exercise (such as walking) or 75 minutes of vigorous exercise (such as running) each week. Where do I find calorie information?  The number of calories in a food can be found on a Nutrition Facts label. If a food does not have a Nutrition Facts label, try to look up the calories online or ask your dietitian for help. Remember that calories are listed per serving. If you choose to have more than one serving of a food, you will have to multiply the calories per serving by the amount of servings you plan to eat. For example, the label on a package of bread might say that a serving size is 1 slice and that there are 90 calories in a serving. If you eat 1 slice,  you will have eaten 90 calories. If you eat 2 slices, you will have eaten 180 calories. How do I keep a food log? Immediately after each meal, record the following information in your food log:  What you ate. Don't forget to include toppings, sauces, and other extras on the food.  How much you ate. This can be measured in cups, ounces, or number of items.  How many calories each food and drink had.  The total number of calories in the meal.  Keep your food log near you, such as in a small notebook in your pocket, or use a mobile app or website. Some programs will calculate calories for you and show you how many calories you have left for the day to meet your goal. What are some calorie counting tips?  Use your calories on foods and drinks that will fill you up and not leave you hungry: ? Some examples of foods that fill you up are nuts and nut butters, vegetables, lean proteins, and high-fiber foods like whole grains. High-fiber foods are foods with more than 5 g fiber per serving. ? Drinks such as sodas, specialty coffee drinks, alcohol, and juices have a lot of calories, yet do not fill you up.  Eat nutritious foods and avoid empty calories. Empty calories are calories you get from foods or beverages that do not have many vitamins or protein, such as candy, sweets, and soda. It is better to have a nutritious high-calorie food (such as an avocado) than a food with few nutrients (such as a bag of chips).  Know how many calories are in the foods you eat most often. This will help you calculate calorie counts faster.  Pay attention to calories in drinks. Low-calorie drinks include water and unsweetened drinks.  Pay attention to nutrition labels for "low fat" or "fat free" foods. These foods sometimes have the same amount of calories  or more calories than the full fat versions. They also often have added sugar, starch, or salt, to make up for flavor that was removed with the fat.  Find a way  of tracking calories that works for you. Get creative. Try different apps or programs if writing down calories does not work for you. What are some portion control tips?  Know how many calories are in a serving. This will help you know how many servings of a certain food you can have.  Use a measuring cup to measure serving sizes. You could also try weighing out portions on a kitchen scale. With time, you will be able to estimate serving sizes for some foods.  Take some time to put servings of different foods on your favorite plates, bowls, and cups so you know what a serving looks like.  Try not to eat straight from a bag or box. Doing this can lead to overeating. Put the amount you would like to eat in a cup or on a plate to make sure you are eating the right portion.  Use smaller plates, glasses, and bowls to prevent overeating.  Try not to multitask (for example, watch TV or use your computer) while eating. If it is time to eat, sit down at a table and enjoy your food. This will help you to know when you are full. It will also help you to be aware of what you are eating and how much you are eating. What are tips for following this plan? Reading food labels  Check the calorie count compared to the serving size. The serving size may be smaller than what you are used to eating.  Check the source of the calories. Make sure the food you are eating is high in vitamins and protein and low in saturated and trans fats. Shopping  Read nutrition labels while you shop. This will help you make healthy decisions before you decide to purchase your food.  Make a grocery list and stick to it. Cooking  Try to cook your favorite foods in a healthier way. For example, try baking instead of frying.  Use low-fat dairy products. Meal planning  Use more fruits and vegetables. Half of your plate should be fruits and vegetables.  Include lean proteins like poultry and fish. How do I count calories when  eating out?  Ask for smaller portion sizes.  Consider sharing an entree and sides instead of getting your own entree.  If you get your own entree, eat only half. Ask for a box at the beginning of your meal and put the rest of your entree in it so you are not tempted to eat it.  If calories are listed on the menu, choose the lower calorie options.  Choose dishes that include vegetables, fruits, whole grains, low-fat dairy products, and lean protein.  Choose items that are boiled, broiled, grilled, or steamed. Stay away from items that are buttered, battered, fried, or served with cream sauce. Items labeled "crispy" are usually fried, unless stated otherwise.  Choose water, low-fat milk, unsweetened iced tea, or other drinks without added sugar. If you want an alcoholic beverage, choose a lower calorie option such as a glass of wine or light beer.  Ask for dressings, sauces, and syrups on the side. These are usually high in calories, so you should limit the amount you eat.  If you want a salad, choose a garden salad and ask for grilled meats. Avoid extra toppings like bacon, cheese, or  fried items. Ask for the dressing on the side, or ask for olive oil and vinegar or lemon to use as dressing.  Estimate how many servings of a food you are given. For example, a serving of cooked rice is  cup or about the size of half a baseball. Knowing serving sizes will help you be aware of how much food you are eating at restaurants. The list below tells you how big or small some common portion sizes are based on everyday objects: ? 1 oz-4 stacked dice. ? 3 oz-1 deck of cards. ? 1 tsp-1 die. ? 1 Tbsp- a ping-pong ball. ? 2 Tbsp-1 ping-pong ball. ?  cup- baseball. ? 1 cup-1 baseball. Summary  Calorie counting means keeping track of how many calories you eat and drink each day. If you eat fewer calories than your body needs, you should lose weight.  A healthy amount of weight to lose per week is  usually 1-2 lb (0.5-0.9 kg). This usually means reducing your daily calorie intake by 500-750 calories.  The number of calories in a food can be found on a Nutrition Facts label. If a food does not have a Nutrition Facts label, try to look up the calories online or ask your dietitian for help.  Use your calories on foods and drinks that will fill you up, and not on foods and drinks that will leave you hungry.  Use smaller plates, glasses, and bowls to prevent overeating. This information is not intended to replace advice given to you by your health care provider. Make sure you discuss any questions you have with your health care provider. Document Released: 05/16/2005 Document Revised: 04/15/2016 Document Reviewed: 04/15/2016 Elsevier Interactive Patient Education  2018 [employer].     Expected Outcomes:  Demonstrated interest in learning. Expect positive outcomes  Education material provided: avs, high fiber handout If problems or questions, patient to contact team via:  Phone and Email  Future DSME appointment: 2 wks  Norm Parcel, RD 12/04/2017 2:55 PM.

## 2017-12-05 ENCOUNTER — Ambulatory Visit (INDEPENDENT_AMBULATORY_CARE_PROVIDER_SITE_OTHER): Admitting: Neurology

## 2017-12-05 ENCOUNTER — Telehealth: Payer: Self-pay | Admitting: Neurology

## 2017-12-05 ENCOUNTER — Encounter: Payer: Self-pay | Admitting: Neurology

## 2017-12-05 VITALS — BP 138/88 | HR 76 | Ht 67.0 in | Wt 270.0 lb

## 2017-12-05 DIAGNOSIS — G4733 Obstructive sleep apnea (adult) (pediatric): Secondary | ICD-10-CM | POA: Diagnosis not present

## 2017-12-05 DIAGNOSIS — G4710 Hypersomnia, unspecified: Secondary | ICD-10-CM

## 2017-12-05 LAB — BASIC METABOLIC PANEL
BUN/Creatinine Ratio: 14 (ref 9–23)
BUN: 10 mg/dL (ref 6–24)
CALCIUM: 9.7 mg/dL (ref 8.7–10.2)
CO2: 23 mmol/L (ref 20–29)
Chloride: 100 mmol/L (ref 96–106)
Creatinine, Ser: 0.72 mg/dL (ref 0.57–1.00)
GFR, EST AFRICAN AMERICAN: 119 mL/min/{1.73_m2} (ref >59)
GFR, EST NON AFRICAN AMERICAN: 104 mL/min/{1.73_m2} (ref >59)
Glucose: 186 mg/dL — ABNORMAL HIGH (ref 65–99)
POTASSIUM: 4 mmol/L (ref 3.5–5.2)
Sodium: 138 mmol/L (ref 134–144)

## 2017-12-05 LAB — CBC WITH DIFFERENTIAL/PLATELET
BASOS: 0 %
Basophils Absolute: 0 10*3/uL (ref 0.0–0.2)
EOS (ABSOLUTE): 0.1 10*3/uL (ref 0.0–0.4)
EOS: 2 %
HEMATOCRIT: 36.8 % (ref 34.0–46.6)
HEMOGLOBIN: 11.7 g/dL (ref 11.1–15.9)
IMMATURE GRANS (ABS): 0 10*3/uL (ref 0.0–0.1)
Immature Granulocytes: 0 %
LYMPHS: 29 %
Lymphocytes Absolute: 2.7 10*3/uL (ref 0.7–3.1)
MCH: 25.8 pg — AB (ref 26.6–33.0)
MCHC: 31.8 g/dL (ref 31.5–35.7)
MCV: 81 fL (ref 79–97)
MONOCYTES: 6 %
Monocytes Absolute: 0.6 10*3/uL (ref 0.1–0.9)
NEUTROS ABS: 6 10*3/uL (ref 1.4–7.0)
Neutrophils: 63 %
Platelets: 247 10*3/uL (ref 150–450)
RBC: 4.54 x10E6/uL (ref 3.77–5.28)
RDW: 14.4 % (ref 12.3–15.4)
WBC: 9.4 10*3/uL (ref 3.4–10.8)

## 2017-12-05 LAB — PROTIME-INR
INR: 0.9 (ref 0.8–1.2)
Prothrombin Time: 9.8 s (ref 9.1–12.0)

## 2017-12-05 MED ORDER — MODAFINIL 200 MG PO TABS: 200.0000 mg | ORAL_TABLET | Freq: Every day | ORAL | 5 refills | Status: DC

## 2017-12-05 MED ORDER — METHYLPHENIDATE HCL 10 MG PO TABS: 10.0000 mg | ORAL_TABLET | Freq: Two times a day (BID) | ORAL | 0 refills | Status: DC

## 2017-12-05 NOTE — Telephone Encounter (Signed)
I will prescribe Ritalin generic, 10 mg bid. Pls notify pt.

## 2017-12-05 NOTE — Telephone Encounter (Signed)
I called [facility]. Pt's modafinil RX will be about $75 and does not require PA.

## 2017-12-05 NOTE — Patient Instructions (Signed)
We will do a blood test for a marker for narcolepsy. We will call you with those results. We will request your previous sleep study records from [place]. For your daytime sleepiness I will start Provigil 200 mg: Take half a pill once daily in the morning for 1 week, then take half a pill twice daily, morning and lunchtime thereafter. Avoid after 3 PM to avoid insomnia at night. Side effects include (but are not limited to): high blood pressure, headache, nervousness, palpitations, GI upset, tremor. Try to be as compliant with your AutoPap machine as possible.

## 2017-12-05 NOTE — Telephone Encounter (Signed)
Pt requesting a call stating she can not afford medication modafinil (PROVIGIL) 200 MG tablet. Requesting a call to discuss switching medication.

## 2017-12-05 NOTE — Progress Notes (Addendum)
Subjective:    Patient ID: Karen [name] is a 42 y.o. female.  HPI      Interim history:   Karen [name] is a 42 year old right-handed woman with an underlying medical history of mood disorder, suboptimally controlled diabetes, COPD, thyroid disease, smoking, hypertension, and morbid obesity with a BMI of over 40, who presents for follow-up consultation of her hypersomnolence and sleep apnea. The patient is unaccompanied today. I first met her on 07/13/2017 at the request of her primary care physician, at which time the patient reported a prior diagnosis of narcolepsy. Prior sleep study results were not available. I suggested we proceed with sleep study testing and next a nap testing. She had a baseline sleep study, followed by a next a nap study. Her baseline sleep study from 08/21/2017 showed a sleep efficiency of 81.7% and a sleep latency delayed at 44.5 minutes. REM latency was borderline at 68 minutes. She had an increased percentage of stage II sleep, absence of slow-wave sleep and REM sleep was high normal at 25.4%. Total AHI was mildly abnormal at 6.5 per hour, REM AHI was in the moderate range at 24.8 per hour, average oxygen saturation was 95%, nadir was 85%. She had no significant PLMS. Next a nap study on 08/22/2017 showed a mean sleep latency of 10.4 minutes for 4 naps with 3 REM onset naps. She was advised that we should go ahead and treat her for her sleep apnea first with AutoPap and consider future testing with sleep study and nap study while treated for sleep apnea and also narcolepsy HLA testing. Her UDS during the MSLT was positive for Benadryl.  Today, 12/05/2017: I reviewed her AutoPap compliance data from 11/04/2000 through 12/03/2017 which is a total of 30 days, during which time she used her AutoPap 24 days with percent used days greater than 4 hours at 27%, indicating significantly suboptimal compliance with an average usage of 3 hours and 36 minutes only, residual AHI at goal at  1.7 per hour, 95th percentile pressure at 9.2 cm, leak on the high side for the 95th percentile at 36.2 L/m on a pressure range of 5-13 cm. In the past 90 days, her compliance was a little bit better, in the beginning, in mid April through mid May she had a compliance for 4 hours or more at 87% which was very good. She reports having difficulty adjusting to AutoPap therapy. She is having a hard time finding a mask that is comfortable enough. She is using a nasal mask. She may be eligible for a different mask once her insurance covers another mask change. She is quite frustrated today. She still has significant daytime sleepiness. We were not able to get any test results as yet from her prior sleep study testing and [place]. She will fill out a form today and was able to get as a fax number for the sleep center in [place], [place]. She is currently not on Ritalin, she is no longer on Cymbalta or BuSpar. She is seeing [facility] and is now on Abilify 10 mg strength as well as Depakote 250 mg at night.  The patient's allergies, current medications, family history, past medical history, past social history, past surgical history and problem list were reviewed and updated as appropriate.   Previously (copied from previous notes for reference):   07/13/2017: (She) reports significant daytime somnolence. She was previously diagnosed with narcolepsy with sleep studies several years ago while she was told [place]. I reviewed your  office note from 07/10/2017. Prior sleep study results are not available for my review today. Of note, she is currently not on Ritalin since October 2018. She has been on generic Provigil. She was supposed to start Xyrem, but moved with her husband. She has been married since 10/18. She has not been on BuSpar or Cymbalta for about a month. She smokes half a pack per day or so, she does not drink alcohol on a regular basis. She drinks caffeine about 16 ounce per day in  the form of soda.  Her Epworth sleepiness score is 22 out of 24 today, fatigue score is 57 out of 63. She currently does not work. She has 2 children. She has gained weight over the past years, had lost some 80 lb after her BF died. 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 She then stopped drinking and gained about 40 lb in a year. She got married in October.  She has 2 grown sons, 78 and 40. She has 3 GC. She reports a history of sleepiness that goes back to 2015 or maybe 2014. She was having problems at work. 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 She was sleepy at work, she was even late one day to work by an hour and did not realize it. She reports that she had fallen asleep at the wheel with her foot on the brake. She had a 15 minute commute only typically but had fallen asleep on her way while stopped in the middle lane as I understand. She has had some falling spells. She has no triggers for these. She has fallen in the shower and on the stairs. She reports that between 2014 and 2016 she had about 7-8 episodes of falling. She has had lightheadedness and dizzy spells. She has lost consciousness. She has fallen to the ground with the use without recollection and waking up with EMS being called by her husband. She has had some recent stressors including marital discord. She went to the emergency room last week because of the dizzy spell and loss of consciousness. She likely had a syncopal spell. She had blood work and an EKG in the ER. She admits that she does not drink water very much. She reports a history of narcolepsy in her paternal aunt. Her mother has episodes of syncope. She reports that she snores loudly and her husband has taped her snoring. She wakes herself up from her snoring. Her bedtime schedule and rise time routine are currently more erratic. She sleeps better in the early morning hours. She tries to go to bed around  10.   Her Past Medical History Is Significant For: Past Medical History:  Diagnosis Date  . (HFpEF) heart failure with preserved ejection fraction ([place]) 11/14/2017  . Alcohol use   . Chest pain 08/02/2017  . COPD (chronic obstructive pulmonary disease) ([place])   . Diabetes mellitus ([place]) 04/17/2017  . Dysuria 11/15/2017  . Fibromyalgia syndrome 04/17/2017  . GERD (gastroesophageal reflux disease) 04/17/2017  . Hypertension   . Hypothyroidism 04/17/2017  . Left foot pain 06/21/2017  . Manic depression ([place]) 04/17/2017  . Obstructive sleep apnea 09/26/2017  . Orthostasis   . Recurrent syncope   . Thyroid disease   . Tobacco use 11/15/2017    Her Past Surgical History Is Significant For: Past Surgical History:  Procedure Laterality Date  . btl    . CHOLECYSTECTOMY    .  shoulder Bilateral     Her Family History Is Significant For: Family History  Problem Relation Age of Onset  . Hypertension Mother   . Syncope episode Mother   . Heart disease Maternal Grandmother        unclear details    Her Social History Is Significant For: Social History   Socioeconomic History  . Marital status: Married    Spouse name: Not on file  . Number of children: Not on file  . Years of education: Not on file  . Highest education level: Not on file  Occupational History  . Not on file  Social Needs  . Financial resource strain: Not on file  . Food insecurity:    Worry: Not on file    Inability: Not on file  . Transportation needs:    Medical: Not on file    Non-medical: Not on file  Tobacco Use  . Smoking status: Former Smoker    Packs/day: 0.30    Types: Cigarettes    Last attempt to quit: 10/29/2017    Years since quitting: 0.1  . Smokeless tobacco: Never Used  . Tobacco comment: 1/3 per day   Substance and Sexual Activity  . Alcohol use: Not Currently  . Drug use: No  . Sexual activity: Not on file  Lifestyle  . Physical activity:    Days per week: Not on file    Minutes per  session: Not on file  . Stress: Not on file  Relationships  . Social connections:    Talks on phone: Not on file    Gets together: Not on file    Attends religious service: Not on file    Active member of club or organization: Not on file    Attends meetings of clubs or organizations: Not on file    Relationship status: Not on file  Other Topics Concern  . Not on file  Social History Narrative  . Not on file    Her Allergies Are:  Allergies  Allergen Reactions  . Aspirin Other (See Comments)    Was told by her mother not to take it in the past but was put on it a few years ago and did fine without any adverse effect  . Tylenol [Acetaminophen]     Does not take due to fatty liver disease  :   Her Current Medications Are:  Outpatient Encounter Medications as of 12/05/2017  Medication Sig  . albuterol (PROVENTIL HFA;VENTOLIN HFA) 108 (90 Base) MCG/ACT inhaler Inhale 2 puffs into the lungs every 4 (four) hours as needed for wheezing or shortness of breath.  Marland Kitchen amLODipine (NORVASC) 10 MG tablet Take 1 tablet (10 mg total) by mouth daily.  . Blood Glucose Monitoring Suppl (FREESTYLE LITE) DEVI Use to check blood sugar up to 3 times a day  . dicyclomine (BENTYL) 20 MG tablet Take 20 mg by mouth 2 (two) times daily.  . empagliflozin (JARDIANCE) 10 MG TABS tablet Take 10 mg by mouth daily.  Marland Kitchen glucose blood (FREESTYLE LITE) test strip Use as instructed  . insulin aspart (NOVOLOG FLEXPEN) 100 UNIT/ML FlexPen Inject 10 Units into the skin 3 (three) times daily with meals.  . Insulin Detemir (LEVEMIR FLEXPEN) 100 UNIT/ML Pen Inject 40 Units into the skin daily at 10 pm. IM program  . Insulin Pen Needle (NOVOFINE PLUS) 32G X 4 MM MISC 1 Dose by Does not apply route 4 (four) times daily. Use with Levemir and Novolog, IM program  . Lancets (  FREESTYLE) lancets Use as instructed  . levothyroxine (SYNTHROID, LEVOTHROID) 150 MCG tablet Take 1 tablet (150 mcg total) by mouth daily before breakfast.  .  lisinopril (PRINIVIL,ZESTRIL) 40 MG tablet Take 1 tablet (40 mg total) by mouth daily.  Marland Kitchen loratadine (CLARITIN) 10 MG tablet Take 1 tablet (10 mg total) by mouth daily.  Marland Kitchen lovastatin (MEVACOR) 40 MG tablet Take 1 tablet (40 mg total) by mouth at bedtime.  . metFORMIN (GLUCOPHAGE) 500 MG tablet Take 2 tablets (1,000 mg total) by mouth 2 (two) times daily with a meal.  . metoCLOPramide (REGLAN) 5 MG tablet Take 1 tablet (5 mg total) at bedtime by mouth.  . metoprolol tartrate (LOPRESSOR) 25 MG tablet Take 6.5 mg by mouth 2 (two) times daily.  . montelukast (SINGULAIR) 10 MG tablet Take 1 tablet (10 mg total) by mouth at bedtime.  Marland Kitchen omeprazole (PRILOSEC) 40 MG capsule Take 1 capsule (40 mg total) by mouth 2 (two) times daily at 8 am and 10 pm.  . vitamin E 400 UNIT capsule Take 1 capsule (400 Units total) by mouth daily.  . sucralfate (CARAFATE) 1 g tablet Take 1 tablet (1 g total) by mouth 4 (four) times daily -  with meals and at bedtime for 7 days.   No facility-administered encounter medications on file as of 12/05/2017.   :  Review of Systems:  Out of a complete 14 point review of systems, all are reviewed and negative with the exception of these symptoms as listed below: Review of Systems  Neurological:       Pt presents today to discuss her auto pap. She does not like her auto pap. Pt's daytime sleepiness is not controlled, per her report.    Objective:  Neurological Exam  Physical Exam Physical Examination:   Vitals:   12/05/17 1145  BP: 138/88  Pulse: 76    General Examination: The patient is a very pleasant 42 y.o. female in no acute distress. She appears well-developed and well-nourished and well groomed.   HEENT: Normocephalic, atraumatic, pupils are equal, round and reactive to light and accommodation. Extraocular tracking is good without limitation to gaze excursion or nystagmus noted. Normal smooth pursuit is noted. Hearing is grossly intact. Face is symmetric with normal  facial animation and normal facial sensation. Speech is clear with no dysarthria noted. There is no hypophonia. There is no lip, neck/head, jaw or voice tremor. Neck with FROM. Oropharynx exam reveals: moderate mouth dryness, adequate dental hygiene and moderate airway crowding.  Chest: Clear to auscultation without wheezing, rhonchi or crackles noted.  Heart: S1+S2+0, regular and normal without murmurs, rubs or gallops noted.   Abdomen: Soft, non-tender and non-distended with normal bowel sounds appreciated on auscultation.  Extremities: There is no pitting edema in the distal lower extremities bilaterally.   Skin: Warm and dry without trophic changes noted.  Musculoskeletal: exam reveals no obvious joint deformities, tenderness or joint swelling or erythema.   Neurologically:  Mental status: The patient is awake, alert and oriented in all 4 spheres. Her immediate and remote memory, attention, language skills and fund of knowledge are appropriate. There is no evidence of aphasia, agnosia, apraxia or anomia. Speech is clear with normal prosody and enunciation. Thought process is linear.  Cranial nerves II - XII are as described above under HEENT exam. Motor exam: Normal bulk, strength and tone is noted. There is no drift, tremor or rebound. Romberg is negative. Fine motor skills and coordination: grossly intact.  Cerebellar testing: No dysmetria  or intention tremor on finger to nose testing. Heel to shin is unremarkable bilaterally. There is no truncal or gait ataxia.  Sensory exam: intact to light touch in the upper and lower extremities.  Gait, station and balance: She stands easily. No veering to one side is noted. No leaning to one side is noted. Posture is age-appropriate and stance is narrow based. Gait shows normal stride length and normal pace. No problems turning are noted.   Assessment and Plan:   In summary, Karen [name] is a very pleasant 42 year old female with an  underlying medical history of mood disorder, suboptimally controlled diabetes, COPD, thyroid disease, smoking, hypertension, and morbid obesity with a BMI of over 40, who  presents for follow-up consultation of her sleep disorder in particular, hypersomnolence and prior diagnosis of narcolepsy. Recent sleep study testing did not show all the criteria for narcolepsy and that her mean sleep latency for the nap test was above 10 minutes. She had just about 6 hours of sleep during her nighttime sleep test. She had a low normal REM latency for nighttime sleep study. She has mild to moderate obstructive sleep apnea. She was advised to proceed with AutoPap therapy. She is struggling with her AutoPap machine. She is willing to continue to work on it. She is encouraged to try to be compliant with AutoPap and also call our sleep lab to see if we can fit her with a different mask and provide her with a sample if possible. She is commended for trying to stay compliant with AutoPap therapy. For her daytime somnolence she is advised to start symptomatically treatment with Provigil. In addition, I would like to proceed with HLA testing for narcolepsy profile today and we will call her with the result. She understands that this is not a definitive for diagnostic test but is supportive. Furthermore, we will try to get records from her previous sleep study testing in [place]. I suggested a 3 month follow-up, sooner if needed. I answered all her questions today and she was in agreement.  I spent 25 minutes in total face-to-face time with the patient, more than 50% of which was spent in counseling and coordination of care, reviewing test results, reviewing medication and discussing or reviewing the diagnosis of hypersomnolence, OSA, the prognosis and treatment options. Pertinent laboratory and imaging test results that were available during this visit with the patient were reviewed by me and considered in my medical decision  making (see chart for details).  Addendum on 12/14/2017: I reviewed her sleep study results from 03/24/2015. Sleep latency was 1.7 minutes, REM latency 6.5 minutes, total sleep time was 351.4 minutes. REM percentage was 11.2 of her total sleep time. She had no significant PLMS. AHI was 2.6 per hour. Lowest oxygen saturation was 87%. She had a multiple sleep latency test on 03/25/2015. She had a UDS which was negative. The patient achieved sleep in all 5 nap opportunities with a mean sleep latency of 37 seconds. She had to REM onset naps. Study was interpreted by Dr. Cleotis Nipper. 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

## 2017-12-06 ENCOUNTER — Telehealth: Payer: Self-pay | Admitting: *Deleted

## 2017-12-06 NOTE — Telephone Encounter (Signed)
Pt [facility] for: Thursday December 07, 2017 10:30 AM Verified [facility] and [facility] at: 8 AM   No solid food after midnight prior to cath, clear liquids until 5 AM day of procedure.   Hold: Metformin -AM of procedure and 48 hours post procedure. Jardiance PM prior to  procedure Insulin AM of procedure 1/2 Insulin PM prior to procedure    Except hold medications AM meds can be  taken pre-cath with sip of water including: ASA 81 mg -pt states she has taken as an adult without problems.   Confirmed patient has responsible person to drive home post procedure and for 24 hours after you arrive home: yes

## 2017-12-06 NOTE — Telephone Encounter (Signed)
I called pt, advised her that instead of modafinil, Dr. Frances FurbishAthar has prescribed ritalin 10 mg PO BID. Pt reports that she has been on ritalin before and is willing to go back on it. Pt will call me with any further questions or concerns and verbalized understanding of recommendations.

## 2017-12-07 ENCOUNTER — Ambulatory Visit (HOSPITAL_COMMUNITY)
Admission: RE | Admit: 2017-12-07 | Discharge: 2017-12-07 | Disposition: A | Discharge status: Home / Self Care | Source: Ambulatory Visit | Attending: Cardiology | Admitting: Cardiology

## 2017-12-07 ENCOUNTER — Encounter (HOSPITAL_COMMUNITY)
Admission: RE | Disposition: A | Discharge status: Home / Self Care | Payer: Self-pay | Source: Ambulatory Visit | Attending: Cardiology

## 2017-12-07 ENCOUNTER — Other Ambulatory Visit: Payer: Self-pay

## 2017-12-07 ENCOUNTER — Encounter (HOSPITAL_COMMUNITY): Payer: Self-pay | Admitting: Cardiology

## 2017-12-07 DIAGNOSIS — M797 Fibromyalgia: Secondary | ICD-10-CM | POA: Insufficient documentation

## 2017-12-07 DIAGNOSIS — J449 Chronic obstructive pulmonary disease, unspecified: Secondary | ICD-10-CM | POA: Insufficient documentation

## 2017-12-07 DIAGNOSIS — G4733 Obstructive sleep apnea (adult) (pediatric): Secondary | ICD-10-CM | POA: Diagnosis not present

## 2017-12-07 DIAGNOSIS — R55 Syncope and collapse: Secondary | ICD-10-CM | POA: Diagnosis not present

## 2017-12-07 DIAGNOSIS — E039 Hypothyroidism, unspecified: Secondary | ICD-10-CM | POA: Diagnosis not present

## 2017-12-07 DIAGNOSIS — G47419 Narcolepsy without cataplexy: Secondary | ICD-10-CM | POA: Insufficient documentation

## 2017-12-07 DIAGNOSIS — I208 Other forms of angina pectoris: Secondary | ICD-10-CM | POA: Diagnosis present

## 2017-12-07 DIAGNOSIS — Z6841 Body Mass Index (BMI) 40.0 and over, adult: Secondary | ICD-10-CM | POA: Diagnosis not present

## 2017-12-07 DIAGNOSIS — I5032 Chronic diastolic (congestive) heart failure: Secondary | ICD-10-CM | POA: Insufficient documentation

## 2017-12-07 DIAGNOSIS — I110 Hypertensive heart disease with heart failure: Secondary | ICD-10-CM | POA: Diagnosis not present

## 2017-12-07 DIAGNOSIS — Z79899 Other long term (current) drug therapy: Secondary | ICD-10-CM | POA: Diagnosis not present

## 2017-12-07 DIAGNOSIS — I25118 Atherosclerotic heart disease of native coronary artery with other forms of angina pectoris: Secondary | ICD-10-CM | POA: Diagnosis not present

## 2017-12-07 DIAGNOSIS — K219 Gastro-esophageal reflux disease without esophagitis: Secondary | ICD-10-CM | POA: Diagnosis not present

## 2017-12-07 DIAGNOSIS — Z87891 Personal history of nicotine dependence: Secondary | ICD-10-CM | POA: Diagnosis not present

## 2017-12-07 DIAGNOSIS — Z794 Long term (current) use of insulin: Secondary | ICD-10-CM | POA: Diagnosis not present

## 2017-12-07 DIAGNOSIS — I5030 Unspecified diastolic (congestive) heart failure: Secondary | ICD-10-CM | POA: Diagnosis present

## 2017-12-07 DIAGNOSIS — Z9889 Other specified postprocedural states: Secondary | ICD-10-CM | POA: Diagnosis not present

## 2017-12-07 DIAGNOSIS — E119 Type 2 diabetes mellitus without complications: Secondary | ICD-10-CM | POA: Insufficient documentation

## 2017-12-07 DIAGNOSIS — R0609 Other forms of dyspnea: Secondary | ICD-10-CM | POA: Diagnosis not present

## 2017-12-07 DIAGNOSIS — Z8249 Family history of ischemic heart disease and other diseases of the circulatory system: Secondary | ICD-10-CM | POA: Diagnosis not present

## 2017-12-07 DIAGNOSIS — Z886 Allergy status to analgesic agent status: Secondary | ICD-10-CM | POA: Insufficient documentation

## 2017-12-07 DIAGNOSIS — Z9049 Acquired absence of other specified parts of digestive tract: Secondary | ICD-10-CM | POA: Diagnosis not present

## 2017-12-07 DIAGNOSIS — R0789 Other chest pain: Secondary | ICD-10-CM

## 2017-12-07 HISTORY — PX: RIGHT/LEFT HEART CATH AND CORONARY ANGIOGRAPHY: CATH118266

## 2017-12-07 LAB — POCT I-STAT 3, VENOUS BLOOD GAS (G3P V)
ACID-BASE DEFICIT: 3 mmol/L — AB (ref 0.0–2.0)
Acid-base deficit: 2 mmol/L (ref 0.0–2.0)
BICARBONATE: 22.9 mmol/L (ref 20.0–28.0)
Bicarbonate: 23.3 mmol/L (ref 20.0–28.0)
O2 SAT: 75 %
O2 SAT: 77 %
TCO2: 24 mmol/L (ref 22–32)
TCO2: 25 mmol/L (ref 22–32)
pCO2, Ven: 41.1 mmHg — ABNORMAL LOW (ref 44.0–60.0)
pCO2, Ven: 41.6 mmHg — ABNORMAL LOW (ref 44.0–60.0)
pH, Ven: 7.354 (ref 7.250–7.430)
pH, Ven: 7.356 (ref 7.250–7.430)
pO2, Ven: 42 mmHg (ref 32.0–45.0)
pO2, Ven: 43 mmHg (ref 32.0–45.0)

## 2017-12-07 LAB — GLUCOSE, CAPILLARY
Glucose-Capillary: 176 mg/dL — ABNORMAL HIGH (ref 70–99)
Glucose-Capillary: 123 mg/dL — ABNORMAL HIGH (ref 70–99)

## 2017-12-07 LAB — POCT I-STAT 3, ART BLOOD GAS (G3+)
Acid-base deficit: 3 mmol/L — ABNORMAL HIGH (ref 0.0–2.0)
BICARBONATE: 22.1 mmol/L (ref 20.0–28.0)
O2 SAT: 99 %
TCO2: 23 mmol/L (ref 22–32)
pCO2 arterial: 37.3 mmHg (ref 32.0–48.0)
pH, Arterial: 7.38 (ref 7.350–7.450)
pO2, Arterial: 120 mmHg — ABNORMAL HIGH (ref 83.0–108.0)

## 2017-12-07 SURGERY — RIGHT/LEFT HEART CATH AND CORONARY ANGIOGRAPHY
Anesthesia: LOCAL

## 2017-12-07 MED ORDER — HEPARIN (PORCINE) IN NACL 2-0.9 UNITS/ML
INTRAMUSCULAR | Status: AC | PRN
  Administered 2017-12-07: 500 mL

## 2017-12-07 MED ORDER — LIDOCAINE HCL (PF) 1 % IJ SOLN
INTRAMUSCULAR | Status: AC
  Filled 2017-12-07: qty 30

## 2017-12-07 MED ORDER — FENTANYL CITRATE (PF) 100 MCG/2ML IJ SOLN
INTRAMUSCULAR | Status: AC
  Filled 2017-12-07: qty 2

## 2017-12-07 MED ORDER — HEPARIN (PORCINE) IN NACL 1000-0.9 UT/500ML-% IV SOLN
INTRAVENOUS | Status: AC
  Filled 2017-12-07: qty 1000

## 2017-12-07 MED ORDER — SODIUM CHLORIDE 0.9 % WEIGHT BASED INFUSION
3.0000 mL/kg/h | INTRAVENOUS | Status: AC
  Administered 2017-12-07: 3 mL/kg/h via INTRAVENOUS

## 2017-12-07 MED ORDER — VERAPAMIL HCL 2.5 MG/ML IV SOLN
INTRAVENOUS | Status: DC | PRN
  Administered 2017-12-07: 10 mL via INTRA_ARTERIAL

## 2017-12-07 MED ORDER — HEPARIN (PORCINE) IN NACL 1000-0.9 UT/500ML-% IV SOLN
INTRAVENOUS | Status: DC | PRN
  Administered 2017-12-07: 500 mL

## 2017-12-07 MED ORDER — ASPIRIN 81 MG PO CHEW: 81.0000 mg | CHEWABLE_TABLET | ORAL | Status: DC

## 2017-12-07 MED ORDER — SODIUM CHLORIDE 0.9 % IV SOLN: 250.0000 mL | INTRAVENOUS | Status: DC | PRN

## 2017-12-07 MED ORDER — IOHEXOL 350 MG/ML SOLN
INTRAVENOUS | Status: DC | PRN
  Administered 2017-12-07: 80 mL via INTRA_ARTERIAL

## 2017-12-07 MED ORDER — SODIUM CHLORIDE 0.9% FLUSH: 3.0000 mL | INTRAVENOUS | Status: DC | PRN

## 2017-12-07 MED ORDER — MIDAZOLAM HCL 2 MG/2ML IJ SOLN
INTRAMUSCULAR | Status: AC
  Filled 2017-12-07: qty 2

## 2017-12-07 MED ORDER — MIDAZOLAM HCL 2 MG/2ML IJ SOLN
INTRAMUSCULAR | Status: DC | PRN
  Administered 2017-12-07 (×2): 1 mg via INTRAVENOUS

## 2017-12-07 MED ORDER — HEPARIN SODIUM (PORCINE) 1000 UNIT/ML IJ SOLN
INTRAMUSCULAR | Status: DC | PRN
  Administered 2017-12-07: 6000 [IU] via INTRAVENOUS

## 2017-12-07 MED ORDER — SODIUM CHLORIDE 0.9 % WEIGHT BASED INFUSION
1.0000 mL/kg/h | INTRAVENOUS | Status: DC
  Administered 2017-12-07: 1 mL/kg/h via INTRAVENOUS

## 2017-12-07 MED ORDER — VERAPAMIL HCL 2.5 MG/ML IV SOLN
INTRAVENOUS | Status: AC
  Filled 2017-12-07: qty 2

## 2017-12-07 MED ORDER — SODIUM CHLORIDE 0.9% FLUSH: 3.0000 mL | Freq: Two times a day (BID) | INTRAVENOUS | Status: DC

## 2017-12-07 MED ORDER — FENTANYL CITRATE (PF) 100 MCG/2ML IJ SOLN
INTRAMUSCULAR | Status: DC | PRN
  Administered 2017-12-07 (×2): 25 ug via INTRAVENOUS

## 2017-12-07 MED ORDER — SODIUM CHLORIDE 0.9 % WEIGHT BASED INFUSION: 1.0000 mL/kg/h | INTRAVENOUS | Status: DC

## 2017-12-07 MED ORDER — ONDANSETRON HCL 4 MG/2ML IJ SOLN: 4.0000 mg | Freq: Four times a day (QID) | INTRAMUSCULAR | Status: DC | PRN

## 2017-12-07 MED ORDER — LIDOCAINE HCL (PF) 1 % IJ SOLN
INTRAMUSCULAR | Status: DC | PRN
  Administered 2017-12-07: 5 mL
  Administered 2017-12-07: 10 mL

## 2017-12-07 SURGICAL SUPPLY — 16 items
CATH BALLN WEDGE 5F 110CM (CATHETERS) ×2 IMPLANT
CATH INFINITI 5FR ANG PIGTAIL (CATHETERS) ×2 IMPLANT
CATH INFINITI JR4 5F (CATHETERS) ×2 IMPLANT
CATH OPTITORQUE TIG 4.0 5F (CATHETERS) ×2 IMPLANT
COVER PRB 48X5XTLSCP FOLD TPE (BAG) ×1 IMPLANT
COVER PROBE 5X48 (BAG) ×1
DEVICE RAD COMP TR BAND LRG (VASCULAR PRODUCTS) ×2 IMPLANT
GLIDESHEATH SLEND SS 6F .021 (SHEATH) ×2 IMPLANT
GUIDEWIRE .025 260CM (WIRE) ×2 IMPLANT
GUIDEWIRE INQWIRE 1.5J.035X260 (WIRE) ×1 IMPLANT
INQWIRE 1.5J .035X260CM (WIRE) ×2
KIT HEART LEFT (KITS) ×2 IMPLANT
PACK CARDIAC CATHETERIZATION (CUSTOM PROCEDURE TRAY) ×2 IMPLANT
SHEATH GLIDE SLENDER 4/5FR (SHEATH) ×2 IMPLANT
TRANSDUCER W/STOPCOCK (MISCELLANEOUS) ×2 IMPLANT
TUBING CIL FLEX 10 FLL-RA (TUBING) ×2 IMPLANT

## 2017-12-07 NOTE — Interval H&P Note (Signed)
History and Physical Interval Note:  12/07/2017 10:43 AM  Karen [name]  has presented today for surgery, with the diagnosis of sob (as possible anginal equivalent) & syncope.   The various methods of treatment have been discussed with the patient and family. After consideration of risks, benefits and other options for treatment, the patient has consented to  Procedure(s): RIGHT/LEFT HEART CATH AND CORONARY ANGIOGRAPHY (N/A) with possible PERCUTANEOUS CORONARY INTERVENTION as a surgical intervention .  The patient's history has been reviewed, patient examined, no change in status, stable for surgery.  I have reviewed the patient's chart and labs.  Questions were answered to the patient's satisfaction.    Cath Lab Visit (complete for each Cath Lab visit)  Clinical Evaluation Leading to the Procedure:   ACS: No.  Non-ACS:    Anginal Classification: CCS II  Anti-ischemic medical therapy: Minimal Therapy (1 class of medications)  Non-Invasive Test Results: No non-invasive testing performed -felt to be a poor candidate for noninvasive evaluation due to body habitus.  Prior CABG: No previous CABG   Bryan Lemmaavid Shallon Yaklin

## 2017-12-07 NOTE — Research (Signed)
CADFEM Informed Consent   Subject Name: Karen [name]  Subject met inclusion and exclusion criteria.  The informed consent form, study requirements and expectations were reviewed with the subject and questions and concerns were addressed prior to the signing of the consent form.  The subject verbalized understanding of the trail requirements.  The subject agreed to participate in the CADFEM trial and signed the informed consent.  The informed consent was obtained prior to performance of any protocol-specific procedures for the subject.  A copy of the signed informed consent was given to the subject and a copy was placed in the subject's medical record.  [name] 12/07/2017, 9:49 AM

## 2017-12-07 NOTE — Discharge Instructions (Signed)
Hold metformin 2 days  Drink plenty of fluids and keep are at or above the heart level   Radial Site Care Refer to this sheet in the next few weeks. These instructions provide you with information about caring for yourself after your procedure. Your health care provider may also give you more specific instructions. Your treatment has been planned according to current medical practices, but problems sometimes occur. Call your health care provider if you have any problems or questions after your procedure. What can I expect after the procedure? After your procedure, it is typical to have the following:  Bruising at the radial site that usually fades within 1-2 weeks.  Blood collecting in the tissue (hematoma) that may be painful to the touch. It should usually decrease in size and tenderness within 1-2 weeks.  Follow these instructions at home:  Take medicines only as directed by your health care provider.  You may shower 24-48 hours after the procedure or as directed by your health care provider. Remove the bandage (dressing) and gently wash the site with plain soap and water. Pat the area dry with a clean towel. Do not rub the site, because this may cause bleeding.  Do not take baths, swim, or use a hot tub until your health care provider approves.  Check your insertion site every day for redness, swelling, or drainage.  Do not apply powder or lotion to the site.  Do not flex or bend the affected arm for 24 hours or as directed by your health care provider.  Do not push or pull heavy objects with the affected arm for 24 hours or as directed by your health care provider.  Do not lift over 10 lb (4.5 kg) for 5 days after your procedure or as directed by your health care provider.  Ask your health care provider when it is okay to: ? Return to work or school. ? Resume usual physical activities or sports. ? Resume sexual activity.  Do not drive home if you are discharged the same day as  the procedure. Have someone else drive you.  You may drive 24 hours after the procedure unless otherwise instructed by your health care provider.  Do not operate machinery or power tools for 24 hours after the procedure.  If your procedure was done as an outpatient procedure, which means that you went home the same day as your procedure, a responsible adult should be with you for the first 24 hours after you arrive home.  Keep all follow-up visits as directed by your health care provider. This is important. Contact a health care provider if:  You have a fever.  You have chills.  You have increased bleeding from the radial site. Hold pressure on the site. Get help right away if:  You have unusual pain at the radial site.  You have redness, warmth, or swelling at the radial site.  You have drainage (other than a small amount of blood on the dressing) from the radial site.  The radial site is bleeding, and the bleeding does not stop after 30 minutes of holding steady pressure on the site.  Your arm or hand becomes pale, cool, tingly, or numb. This information is not intended to replace advice given to you by your health care provider. Make sure you discuss any questions you have with your health care provider. Document Released: 06/18/2010 Document Revised: 10/22/2015 Document Reviewed: 12/02/2013 Elsevier Interactive Patient Education  2018 [employer]

## 2017-12-12 ENCOUNTER — Encounter: Payer: Self-pay | Admitting: *Deleted

## 2017-12-12 LAB — HM DIABETES EYE EXAM

## 2017-12-13 LAB — NARCOLEPSY EVALUATION
DQA1*01:02: POSITIVE
HLA-DQ BETA: POSITIVE

## 2017-12-21 ENCOUNTER — Ambulatory Visit: Admitting: Dietician

## 2017-12-26 ENCOUNTER — Encounter: Payer: Self-pay | Admitting: Physician Assistant

## 2017-12-26 ENCOUNTER — Encounter: Admitting: Internal Medicine

## 2017-12-26 NOTE — Progress Notes (Addendum)
Cardiology Office Note    Date:  12/28/2017  ID:  Karen [name], DOB [birth year], MRN [id number] PCP:  Eulah Pont, MD  Cardiologist:  Verne Carrow, MD   Chief Complaint: post cath  History of Present Illness:  Karen [name] is a 42 y.o. female with history of IDDM, COPD, GERD, HTN, hypothyroidism, manic depression, OSA, narcolepsy, fibromyalgia, recurrent syncope, prior alcohol use, morbid obesity, orthostasis who presents back for follow-up.  Please see 12/04/17 note for detailed outlining of prior cardiac symptoms of recurrent atypical syncope vs narcoleptic events, atypical chest pain, and dyspnea. She has struggled with difficult social situation and alcohol in the past. Earlier this year primary care noted orthostatic HR response to standing so they have been weaning her beta blocker. 2D Echo 09/2017 showed EF 60-65%, grade 1 DD, benign. Nuclear stress test 10/18/17 showed normal perfusion, hypertensive response to exercise, horizontal ST depression 1mm noted during test. Event monitor was normal - she reports presyncope while wearing but no full syncope. Due to persistent dyspnea, she underwent definitive R/LHC showing 30% mid LAD, otherwise no significant disease, EF 55-65%, normal LVEDP, normal heart pressures.   She returns for follow-up today overall feeling stable from cardiac standpoint. Does not report any new chest pain. DOE continues, stable from before. 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 Also notices intermittent sensation that her legs are wet when they aren't, and some numbness in her thighs. She does not know when she will have time to exercise since it is hard to get used to working these hours. She does not wear tight pants. No LEE. HR is in the 107-120 range today, sinus tach. It was previously 80-90 but Epic does reveal  intermittent HR in the low 100s in the past. She reports she just came from a very active day at work. She was also just started on Ritalin a few weeks ago by neurology for her narcolepsy.   Past Medical History:  Diagnosis Date  . Alcohol use   . Atypical chest pain    a. Cath 11/2017 - R/LHC showing 30% mid LAD, otherwise no significant disease, EF 55-65%, normal LVEDP, normal heart pressures (no evidence of significant CAD or CHF).  . Chest pain 08/02/2017  . COPD (chronic obstructive pulmonary disease) (HCC)   . Diabetes mellitus (HCC) 04/17/2017  . Dysuria 11/15/2017  . Fibromyalgia syndrome 04/17/2017  . GERD (gastroesophageal reflux disease) 04/17/2017  . Hypertension   . Hypothyroidism 04/17/2017  . Left foot pain 06/21/2017  . 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 Obstructive sleep apnea 09/26/2017  . Orthostasis   . Recurrent syncope   . Thyroid disease   . Tobacco use 11/15/2017    Past Surgical History:  Procedure Laterality Date  . btl    . CHOLECYSTECTOMY    . RIGHT/LEFT HEART CATH AND CORONARY ANGIOGRAPHY N/A 12/07/2017   Procedure: RIGHT/LEFT HEART CATH AND CORONARY ANGIOGRAPHY;  Surgeon: Marykay Lex, MD;  Location: [facility] INVASIVE CV LAB;  Service: Cardiovascular;  Laterality: N/A;  . shoulder Bilateral     Current Medications: Current Meds  Medication Sig  . albuterol (PROVENTIL HFA;VENTOLIN HFA) 108 (90 Base) MCG/ACT inhaler Inhale 2 puffs into the lungs every 4 (four) hours as needed for wheezing or shortness of breath.  Marland Kitchen amLODipine (NORVASC) 10 MG tablet Take 1 tablet (  10 mg total) by mouth daily.  . ARIPiprazole (ABILIFY) 10 MG tablet Take 10 mg by mouth at bedtime.  Marland Kitchen aspirin 81 MG chewable tablet Chew 81 mg by mouth once.  . Blood Glucose Monitoring Suppl (FREESTYLE LITE) DEVI Use to check blood sugar up to 3 times a day  . dicyclomine (BENTYL) 20 MG tablet Take 20 mg by mouth 2 (two) times daily as needed for spasms.   . divalproex (DEPAKOTE) 250 MG DR  tablet Take 250 mg by mouth 2 (two) times daily.   . empagliflozin (JARDIANCE) 10 MG TABS tablet Take 10 mg by mouth daily.  Marland Kitchen gabapentin (NEURONTIN) 300 MG capsule Take 300 mg by mouth 2 (two) times daily.  Marland Kitchen glucose blood (FREESTYLE LITE) test strip Use as instructed  . insulin aspart (NOVOLOG FLEXPEN) 100 UNIT/ML FlexPen Inject 10 Units into the skin 3 (three) times daily with meals.  . Insulin Detemir (LEVEMIR FLEXPEN) 100 UNIT/ML Pen Inject 40 Units into the skin daily at 10 pm. IM program  . Insulin Pen Needle (NOVOFINE PLUS) 32G X 4 MM MISC 1 Dose by Does not apply route 4 (four) times daily. Use with Levemir and Novolog, IM program  . Lancets (FREESTYLE) lancets Use as instructed  . levothyroxine (SYNTHROID, LEVOTHROID) 150 MCG tablet Take 1 tablet (150 mcg total) by mouth daily before breakfast.  . lisinopril (PRINIVIL,ZESTRIL) 40 MG tablet Take 1 tablet (40 mg total) by mouth daily.  Marland Kitchen loratadine (CLARITIN) 10 MG tablet Take 1 tablet (10 mg total) by mouth daily. (Patient taking differently: Take 10 mg by mouth daily as needed for allergies. )  . lovastatin (MEVACOR) 40 MG tablet Take 1 tablet (40 mg total) by mouth at bedtime.  . metFORMIN (GLUCOPHAGE) 500 MG tablet Take 2 tablets (1,000 mg total) by mouth 2 (two) times daily with a meal.  . methylphenidate (RITALIN) 10 MG tablet Take 1 tablet (10 mg total) by mouth 2 (two) times daily. Take in the morning and at lunch, no later than 3 PM  . metoCLOPramide (REGLAN) 5 MG tablet Take 1 tablet (5 mg total) at bedtime by mouth.  . metoprolol tartrate (LOPRESSOR) 25 MG tablet Take 6.5 mg by mouth 2 (two) times daily.  . montelukast (SINGULAIR) 10 MG tablet Take 1 tablet (10 mg total) by mouth at bedtime.  Marland Kitchen omeprazole (PRILOSEC) 40 MG capsule Take 1 capsule (40 mg total) by mouth 2 (two) times daily at 8 am and 10 pm.  . prazosin (MINIPRESS) 1 MG capsule Take 1 mg by mouth at bedtime.  . vitamin E 400 UNIT capsule Take 1 capsule (400 Units  total) by mouth daily.      Allergies:   Aspirin and Tylenol [acetaminophen]   Social History   Socioeconomic History  . Marital status: Married    Spouse name: Not on file  . Number of children: Not on file  . Years of education: Not on file  . Highest education level: Not on file  Occupational History  . Not on file  Social Needs  . Financial resource strain: Not on file  . Food insecurity:    Worry: Not on file    Inability: Not on file  . Transportation needs:    Medical: Not on file    Non-medical: Not on file  Tobacco Use  . Smoking status: Former Smoker    Packs/day: 0.30    Types: Cigarettes    Last attempt to quit: 10/29/2017    Years since  quitting: 0.1  . Smokeless tobacco: Never Used  . Tobacco comment: 1/3 per day   Substance and Sexual Activity  . Alcohol use: Not Currently  . Drug use: No  . Sexual activity: Not on file  Lifestyle  . Physical activity:    Days per week: Not on file    Minutes per session: Not on file  . Stress: Not on file  Relationships  . Social connections:    Talks on phone: Not on file    Gets together: Not on file    Attends religious service: Not on file    Active member of club or organization: Not on file    Attends meetings of clubs or organizations: Not on file    Relationship status: Not on file  Other Topics Concern  . Not on file  Social History Narrative  . Not on file     Family History:  The patient's family history includes Heart disease in her maternal grandmother; Hypertension in her mother; Syncope episode in her mother.  ROS:   Please see the history of present illness. Otherwise, review of systems is positive for sensation of numbness on side of thighs, sometimes feels legs are wet when they arent All other systems are reviewed and otherwise negative.    PHYSICAL EXAM:   VS:  BP 128/80   Pulse (!) 107   Ht 5\' 7"  (1.702 m)   Wt 274 lb (124.3 kg)   SpO2 97%   BMI 42.91 kg/m   BMI: Body mass index  is 42.91 kg/m. GEN: Well nourished, well developed morbidly obese AAF, in no acute distress HEENT: normocephalic, atraumatic Neck: no JVD, carotid bruits, or masses Cardiac: Reg rhythm elevated rate; no murmurs, rubs, or gallops, no edema  Respiratory:  clear to auscultation bilaterally, normal work of breathing GI: soft, nontender, nondistended, + BS MS: no deformity or atrophy Skin: warm and dry, no rash Neuro:  Alert and Oriented x 3, Strength and sensation are intact, follows commands Psych: euthymic mood, full affect  Wt Readings from Last 3 Encounters:  12/28/17 274 lb (124.3 kg)  12/07/17 270 lb (122.5 kg)  12/05/17 270 lb (122.5 kg)      Studies/Labs Reviewed:   EKG:  EKG was ordered today and personally reviewed by me and demonstrates sinus tach 107 nonspecific STT changes similar to prior  Recent Labs: 08/07/2017: ALT 21 09/26/2017: TSH 1.150 12/04/2017: BUN 10; Creatinine, Ser 0.72; Hemoglobin 11.7; Platelets 247; Potassium 4.0; Sodium 138   Lipid Panel No results found for: CHOL, TRIG, HDL, CHOLHDL, VLDL, LDLCALC, LDLDIRECT  Additional studies/ records that were reviewed today include: Summarized above.    ASSESSMENT & PLAN:   1. Atypical chest pain/Dyspnea on exertion - cardiac cath did not show any significant obstructive disease and heart pressures were normal. Therefore, symptoms are noncardiac. Chronicity does not fit for VTE and d-dimer was normal in 07/2017. Pulse ox WNL. Does not necessarily need to take aspirin from a cardiac standpoint at present time but will defer to primary care for decision making with primary prevention. She should participate in regular physical activity but it sounds like she has personal barriers to overcome before complying with this recommendation. 2. Sinus tachycardia - check BMET, CBC, TSH. I suspect this is likely due to Ritalin. I encouraged her to reach out to her neurologist to inquire whether they would like to make a dose  adjustment. If not, I would suggest she get back into primary care to discuss  possibly going back up on her beta blocker. I do not necessarily think she has to continue to see cardiology for these dose adjustments. 3. Recurrent syncope - quiescent. Event monitor unrevealing. Can consider ILR if symptoms recur. She also has concomitant narcolepsy, muddling the diagnosis. 4. Essential HTN - managed by primary care.  Disposition: F/u with Dr. Clifton JamesMcAlhany in 1 year. F/u advised with PCP for other sx listed in ROS.   Medication Adjustments/Labs and Tests Ordered: Current medicines are reviewed at length with the patient today.  Concerns regarding medicines are outlined above. Medication changes, Labs and Tests ordered today are summarized above and listed in the Patient Instructions accessible in Encounters.   Signed, Laurann Montanaayna N Amerika Nourse, PA-C  12/28/2017 2:25 PM    [facility] [address], [place], [place]  [address] Phone: [phone]; Fax: [phone]

## 2017-12-28 ENCOUNTER — Ambulatory Visit (INDEPENDENT_AMBULATORY_CARE_PROVIDER_SITE_OTHER): Admitting: Physician Assistant

## 2017-12-28 ENCOUNTER — Encounter: Payer: Self-pay | Admitting: Physician Assistant

## 2017-12-28 VITALS — BP 128/80 | HR 107 | Ht 67.0 in | Wt 274.0 lb

## 2017-12-28 DIAGNOSIS — R000 Tachycardia, unspecified: Secondary | ICD-10-CM

## 2017-12-28 DIAGNOSIS — R55 Syncope and collapse: Secondary | ICD-10-CM | POA: Diagnosis not present

## 2017-12-28 DIAGNOSIS — R0609 Other forms of dyspnea: Secondary | ICD-10-CM | POA: Diagnosis not present

## 2017-12-28 DIAGNOSIS — I10 Essential (primary) hypertension: Secondary | ICD-10-CM

## 2017-12-28 DIAGNOSIS — R0789 Other chest pain: Secondary | ICD-10-CM

## 2017-12-28 NOTE — Patient Instructions (Addendum)
Medication Instructions:  Your physician has recommended you make the following change in your medication:  You can stop the Aspirin   Labwork: TODAY:  BMET, CBC, & TSH  Testing/Procedures: None ordere  Follow-Up: Your physician wants you to follow-up in: 1 YEAR WITH DR. Clifton JamesMCALHANY   You will receive a reminder letter in the mail two months in advance. If you don't receive a letter, please call our office to schedule the follow-up appointment.   Any Other Special Instructions Will Be Listed Below (If Applicable). I would suggest you talk to your neurologist about your elevated heart rate (tachycardia) as they may want to adjust dose. Another option would be to increase your metoprolol, but it appears your primary care provider has been working with you to taper this off so if neurology does not want to make any changes, please review with primary care.    If you need a refill on your cardiac medications before your next appointment, please call your pharmacy.

## 2017-12-29 LAB — CBC
HEMATOCRIT: 37.7 % (ref 34.0–46.6)
HEMOGLOBIN: 11.8 g/dL (ref 11.1–15.9)
MCH: 25.8 pg — ABNORMAL LOW (ref 26.6–33.0)
MCHC: 31.3 g/dL — ABNORMAL LOW (ref 31.5–35.7)
MCV: 83 fL (ref 79–97)
Platelets: 286 10*3/uL (ref 150–450)
RBC: 4.57 x10E6/uL (ref 3.77–5.28)
RDW: 14.9 % (ref 12.3–15.4)
WBC: 10.2 10*3/uL (ref 3.4–10.8)

## 2017-12-29 LAB — BASIC METABOLIC PANEL
BUN / CREAT RATIO: 14 (ref 9–23)
BUN: 10 mg/dL (ref 6–24)
CALCIUM: 9.9 mg/dL (ref 8.7–10.2)
CHLORIDE: 99 mmol/L (ref 96–106)
CO2: 24 mmol/L (ref 20–29)
Creatinine, Ser: 0.73 mg/dL (ref 0.57–1.00)
GFR calc Af Amer: 117 mL/min/{1.73_m2} (ref >59)
GFR calc non Af Amer: 102 mL/min/{1.73_m2} (ref >59)
GLUCOSE: 136 mg/dL — AB (ref 65–99)
Potassium: 4 mmol/L (ref 3.5–5.2)
Sodium: 140 mmol/L (ref 134–144)

## 2017-12-29 LAB — TSH: TSH: 2.79 u[IU]/mL (ref 0.450–4.500)

## 2018-01-01 ENCOUNTER — Encounter: Payer: Self-pay | Admitting: Internal Medicine

## 2018-01-01 ENCOUNTER — Ambulatory Visit (INDEPENDENT_AMBULATORY_CARE_PROVIDER_SITE_OTHER): Admitting: Internal Medicine

## 2018-01-01 ENCOUNTER — Telehealth: Payer: Self-pay | Admitting: Internal Medicine

## 2018-01-01 ENCOUNTER — Other Ambulatory Visit: Payer: Self-pay

## 2018-01-01 VITALS — BP 120/78 | HR 98 | Temp 98.0°F | Ht 67.0 in | Wt 276.9 lb

## 2018-01-01 DIAGNOSIS — M797 Fibromyalgia: Secondary | ICD-10-CM | POA: Diagnosis not present

## 2018-01-01 DIAGNOSIS — M792 Neuralgia and neuritis, unspecified: Secondary | ICD-10-CM

## 2018-01-01 DIAGNOSIS — E669 Obesity, unspecified: Secondary | ICD-10-CM | POA: Diagnosis not present

## 2018-01-01 DIAGNOSIS — F419 Anxiety disorder, unspecified: Secondary | ICD-10-CM | POA: Diagnosis not present

## 2018-01-01 DIAGNOSIS — Z6841 Body Mass Index (BMI) 40.0 and over, adult: Secondary | ICD-10-CM

## 2018-01-01 DIAGNOSIS — Z79899 Other long term (current) drug therapy: Secondary | ICD-10-CM

## 2018-01-01 DIAGNOSIS — E1140 Type 2 diabetes mellitus with diabetic neuropathy, unspecified: Secondary | ICD-10-CM

## 2018-01-01 MED ORDER — GABAPENTIN 300 MG PO CAPS: 300.0000 mg | ORAL_CAPSULE | Freq: Two times a day (BID) | ORAL | 3 refills | Status: DC

## 2018-01-01 NOTE — Addendum Note (Signed)
Addended by: Yvette RackAGYEI, OBED K on: 01/01/2018 04:09 PM   Modules accepted: Orders

## 2018-01-01 NOTE — Patient Instructions (Addendum)
Ms. Daphine DeutscherMartin,   It was a pleasure taking care of you here in the clinic.  The numbness you have in your left thigh might be a component of fibromyalgia and diabetes.  I am referring you to neurology to have a nerve conduction test done.  -I am increasing your gabapentin dose.  You will take 600 mg in the morning and 300 mg at night for now.  If the pain gets worse at night you can take 600 mg at night.  ~Dr. Dortha SchwalbeAgyei

## 2018-01-01 NOTE — Telephone Encounter (Signed)
Thank you Helen 

## 2018-01-01 NOTE — Telephone Encounter (Signed)
CALL [facility] [phone], QUESTION ABOUT MEDICATION SENT OVER

## 2018-01-01 NOTE — Telephone Encounter (Signed)
Spoke w/ op pharm, please send a new script for gabapentin, I will speak to you

## 2018-01-01 NOTE — Progress Notes (Signed)
   CC: Left thigh numbness  HPI:  [name] is a 42 y.o. woman with past medical history significant for fibromyalgia syndrome, diabetes mellitus, obesity, hypothyroidism presenting today with 1.5-week history of burning sensation at left lateral thigh, proximally.  Since onset, she has also experience numbness and pain that is significantly worse with ambulation and prolonged standing.  She also states that there are times where she would feel a sensation of water flowing down her left lateral thigh.  She rates her pain at 9/10 and will usually take 3 Aleve in the morning with 2 - 300 mg dose gabapentin.  At night, she will take 2 Aleve with 1- 300 mg gabapentin with no relief.  She denies any signs of radiculopathy or lower back pain.  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 Etiology remains unknown and could be multifactorial given her history of diabetes and fibromyalgia syndrome. Plan is to start gabapentin 600mg  qAm and 300 qPM. If symptoms continue at night, she can increase her nighttime gabapentin to 600mg . I will also refer her to neurology for a nerve conduction study.   Past Medical History:  Diagnosis Date  . Alcohol use   . Atypical chest pain    a. Cath 11/2017 - R/LHC showing 30% mid LAD, otherwise no significant disease, EF 55-65%, normal LVEDP, normal heart pressures (no evidence of significant CAD or CHF).  . Chest pain 08/02/2017  . COPD (chronic obstructive pulmonary disease) (HCC)   . Diabetes mellitus (HCC) 04/17/2017  . Dysuria 11/15/2017  . Fibromyalgia syndrome 04/17/2017  . GERD (gastroesophageal reflux disease) 04/17/2017  . Hypertension   . Hypothyroidism 04/17/2017  . Left foot pain 06/21/2017  . 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 Obstructive sleep apnea 09/26/2017  . Orthostasis   . Recurrent syncope   . Thyroid disease   . Tobacco use 11/15/2017   Review of Systems:  As per HPI  Physical  Exam:  Vitals:   01/01/18 1427  BP: 120/78  Pulse: 98  Temp: 98 F (36.7 C)  TempSrc: Oral  SpO2: 99%  Weight: 276 lb 14.4 oz (125.6 kg)  Height: 5\' 7"  (1.702 m)     Physical Exam  Musculoskeletal:       Left hip: She exhibits no tenderness.       Legs:  Constitutional: In NAD, appears as stated age CV: RRR, no MGR Resp: CTABL, no wheezes crackles, rhonchi Extremities: Left thigh NTTP, left upper extremity with focal tenderness at multiple areas, negative straight leg test, lumbar spine and paraspinal non tender to palpation.    Assessment & Plan:   See Encounters Tab for problem based charting.  Patient discussed with Dr. Oswaldo DoneVincent.

## 2018-01-02 NOTE — Progress Notes (Signed)
Internal Medicine Clinic Attending  I saw and evaluated the patient.  I personally confirmed the key portions of the history and exam documented by Dr. Dortha SchwalbeAgyei and I reviewed pertinent patient test results.  The assessment, diagnosis, and plan were formulated together and I agree with the documentation in the resident's note.  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 We talked about expectations, increase gabapentin, and this would likely be a self limited problem. It is hard to distinguish some of the neuropathy features from her underlying fibromyalgia, which is causing her some anxiety. We will refer her to neurology to do nerve conduction studies to clarify the diagnoses.

## 2018-01-08 ENCOUNTER — Ambulatory Visit (INDEPENDENT_AMBULATORY_CARE_PROVIDER_SITE_OTHER): Admitting: Internal Medicine

## 2018-01-08 ENCOUNTER — Other Ambulatory Visit: Payer: Self-pay

## 2018-01-08 ENCOUNTER — Encounter: Payer: Self-pay | Admitting: Internal Medicine

## 2018-01-08 VITALS — BP 110/63 | HR 100 | Temp 99.1°F | Ht 67.0 in | Wt 273.0 lb

## 2018-01-08 DIAGNOSIS — M797 Fibromyalgia: Secondary | ICD-10-CM

## 2018-01-08 DIAGNOSIS — R202 Paresthesia of skin: Secondary | ICD-10-CM

## 2018-01-08 DIAGNOSIS — Z79899 Other long term (current) drug therapy: Secondary | ICD-10-CM

## 2018-01-08 DIAGNOSIS — R200 Anesthesia of skin: Secondary | ICD-10-CM | POA: Diagnosis not present

## 2018-01-08 DIAGNOSIS — G5712 Meralgia paresthetica, left lower limb: Secondary | ICD-10-CM

## 2018-01-08 DIAGNOSIS — G5710 Meralgia paresthetica, unspecified lower limb: Secondary | ICD-10-CM | POA: Insufficient documentation

## 2018-01-08 NOTE — Assessment & Plan Note (Addendum)
Meralgia paresthetica: 3-week history of numbness, tingling of the left lateral thigh distributed along the cutaneous femoral nerve.  She has a history of fibromyalgia and is difficult at this point to distinguish if her current symptoms is due to underlying fibromyalgia syndrome or new onset neuralgia.  Her symptoms gradually increased from the time she wakes up and peaks in the afternoon when she is awake.  Since her last clinic visit, she has been aggressively exercising to lose weight and have applauded her on that and recommended for her to continue -Continue gabapentin 600 mg qAM and 300 to 600 mg qPM -Appointment with neurology for EMG on 03/07/2018 -Continue weight loss exercise

## 2018-01-08 NOTE — Progress Notes (Signed)
   CC: Follow-up meralgia paresthetica  HPI:  [name] is a 42 y.o. with presumed diagnosis of meralgia paresthetica, fibromyalgia syndrome presenting today for follow-up.  She was seen by me at the clinic a week ago with complaint of left thigh numbness, tingling, intermittent pain and sensation of fluid dripping down her thigh.  She was asked to increase her gabapentin dose to 600 mg in the morning and 300 to 600 mg at night.  She was also given a referral to neurology for evaluation with EMG. Today she reports of no improvement with the symptoms.    Meralgia paresthetica: 3-week history of numbness, tingling of the left lateral thigh distributed along the cutaneous femoral nerve.  She has a history of fibromyalgia and is difficult at this point to distinguish if her current symptoms is due to underlying fibromyalgia syndrome or new onset neuralgia.  Her symptoms gradually increased from the time she wakes up and peaks in the afternoon when she is awake.  Since her last clinic visit, she has been aggressively exercising to lose weight and have applauded her on that and recommended for her to continue -Continue gabapentin 600 mg qAM and 300 to 600 mg qPM -Appointment with neurology for EMG on 03/07/2018 -Continue weight loss exercise  Past Medical History:  Diagnosis Date  . Alcohol use   . Atypical chest pain    a. Cath 11/2017 - R/LHC showing 30% mid LAD, otherwise no significant disease, EF 55-65%, normal LVEDP, normal heart pressures (no evidence of significant CAD or CHF).  . Chest pain 08/02/2017  . COPD (chronic obstructive pulmonary disease) (HCC)   . Diabetes mellitus (HCC) 04/17/2017  . Dysuria 11/15/2017  . Fibromyalgia syndrome 04/17/2017  . GERD (gastroesophageal reflux disease) 04/17/2017  . Hypertension   . Hypothyroidism 04/17/2017  . Left foot pain 06/21/2017  . 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 Obstructive sleep apnea 09/26/2017  . Orthostasis   . Recurrent syncope    . Thyroid disease   . Tobacco use 11/15/2017   Review of Systems: As per HPI  Physical Exam:  Vitals:   01/08/18 1553  BP: 110/63  Pulse: 100  Temp: 99.1 F (37.3 C)  TempSrc: Oral  SpO2: 96%  Weight: 273 lb (123.8 kg)  Height: 5\' 7"  (1.702 m)   Constitutional: Tearful because of ongoing symptoms with no underlying cause Cardiovascular: Normal S1 and S2 no murmurs, gallops, rubs Respiratory: Clear to auscultation bilaterally Extremity: Decreased sensation of the left thigh, not swollen  Assessment & Plan:   See Encounters Tab for problem based charting.  Patient discussed with Dr. Rogelia BogaButcher.

## 2018-01-08 NOTE — Patient Instructions (Signed)
Ms. Daphine DeutscherMartin,  It was a pleasure taking care of you here at the clinic and I am sorry you continue to experience left thigh numbness.  I will continue on the weight loss treatment and he seems to be doing really good with that.  I will continue the gabapentin.  Please decrease the frequency of Aleve  With think you might have something that we call meralgia paresthetica.  You can reach this on the Internet so you have more knowledge about it  Dr. Dortha SchwalbeAgyei

## 2018-01-09 ENCOUNTER — Encounter: Admitting: Internal Medicine

## 2018-01-09 NOTE — Progress Notes (Signed)
Internal Medicine Clinic Attending  I saw and evaluated the patient.  I personally confirmed the key portions of the history and exam documented by Dr. Dortha SchwalbeAgyei and I reviewed pertinent patient test results.  The assessment, diagnosis, and plan were formulated together and I agree with the documentation in the resident's note. 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

## 2018-01-11 LAB — HM DIABETES EYE EXAM

## 2018-01-15 ENCOUNTER — Other Ambulatory Visit: Payer: Self-pay | Admitting: Neurology

## 2018-01-15 MED ORDER — METHYLPHENIDATE HCL 10 MG PO TABS: 10.0000 mg | ORAL_TABLET | Freq: Two times a day (BID) | ORAL | 0 refills | Status: DC

## 2018-01-15 MED FILL — GABAPENTIN 300 MG CAPSULE: 300 | 30 days supply | Qty: 120 | Fill #0

## 2018-01-15 NOTE — Telephone Encounter (Signed)
Pt called stating she is needing a new Rx for methylphenidate (RITALIN) 10 MG tablet sent to [facility]. Requesting a call once Rx has been sent

## 2018-01-15 NOTE — Telephone Encounter (Signed)
I called pt and advised her that [facility]. Pt verbalized understanding and appreciation.

## 2018-01-15 NOTE — Telephone Encounter (Signed)
Pt is up to date and her appts. [place] [facility] Checked. Pt is due for a refill on ritalin. Will send to Dr. Frances FurbishAthar for review and approval.

## 2018-02-01 ENCOUNTER — Encounter

## 2018-02-16 ENCOUNTER — Telehealth: Payer: Self-pay | Admitting: Neurology

## 2018-02-16 MED ORDER — METHYLPHENIDATE HCL 10 MG PO TABS: 10.0000 mg | ORAL_TABLET | Freq: Two times a day (BID) | ORAL | 0 refills | Status: DC

## 2018-02-16 MED FILL — GABAPENTIN 300 MG CAPSULE: 300 | 30 days supply | Qty: 120 | Fill #1

## 2018-02-16 NOTE — Telephone Encounter (Signed)
The patient called for a refill on Ritalin, tried to call her back, unable to reach her, I will call in the refill.

## 2018-02-20 ENCOUNTER — Other Ambulatory Visit: Payer: Self-pay

## 2018-02-20 ENCOUNTER — Ambulatory Visit (INDEPENDENT_AMBULATORY_CARE_PROVIDER_SITE_OTHER): Admitting: Internal Medicine

## 2018-02-20 ENCOUNTER — Encounter: Payer: Self-pay | Admitting: Internal Medicine

## 2018-02-20 ENCOUNTER — Telehealth: Payer: Self-pay | Admitting: *Deleted

## 2018-02-20 VITALS — BP 124/74 | HR 96 | Temp 98.2°F | Ht 67.0 in | Wt 271.3 lb

## 2018-02-20 DIAGNOSIS — K219 Gastro-esophageal reflux disease without esophagitis: Secondary | ICD-10-CM

## 2018-02-20 DIAGNOSIS — E1159 Type 2 diabetes mellitus with other circulatory complications: Secondary | ICD-10-CM

## 2018-02-20 DIAGNOSIS — I10 Essential (primary) hypertension: Secondary | ICD-10-CM

## 2018-02-20 DIAGNOSIS — I110 Hypertensive heart disease with heart failure: Secondary | ICD-10-CM | POA: Diagnosis not present

## 2018-02-20 DIAGNOSIS — M797 Fibromyalgia: Secondary | ICD-10-CM

## 2018-02-20 DIAGNOSIS — F319 Bipolar disorder, unspecified: Secondary | ICD-10-CM

## 2018-02-20 DIAGNOSIS — G5712 Meralgia paresthetica, left lower limb: Secondary | ICD-10-CM | POA: Diagnosis not present

## 2018-02-20 DIAGNOSIS — Z23 Encounter for immunization: Secondary | ICD-10-CM

## 2018-02-20 DIAGNOSIS — E113293 Type 2 diabetes mellitus with mild nonproliferative diabetic retinopathy without macular edema, bilateral: Secondary | ICD-10-CM

## 2018-02-20 DIAGNOSIS — J449 Chronic obstructive pulmonary disease, unspecified: Secondary | ICD-10-CM

## 2018-02-20 DIAGNOSIS — E118 Type 2 diabetes mellitus with unspecified complications: Secondary | ICD-10-CM

## 2018-02-20 DIAGNOSIS — Z794 Long term (current) use of insulin: Secondary | ICD-10-CM | POA: Diagnosis not present

## 2018-02-20 DIAGNOSIS — R0789 Other chest pain: Secondary | ICD-10-CM

## 2018-02-20 DIAGNOSIS — G4733 Obstructive sleep apnea (adult) (pediatric): Secondary | ICD-10-CM

## 2018-02-20 DIAGNOSIS — I5030 Unspecified diastolic (congestive) heart failure: Secondary | ICD-10-CM

## 2018-02-20 DIAGNOSIS — Z9989 Dependence on other enabling machines and devices: Secondary | ICD-10-CM

## 2018-02-20 DIAGNOSIS — I152 Hypertension secondary to endocrine disorders: Secondary | ICD-10-CM

## 2018-02-20 DIAGNOSIS — Z79899 Other long term (current) drug therapy: Secondary | ICD-10-CM

## 2018-02-20 DIAGNOSIS — E039 Hypothyroidism, unspecified: Secondary | ICD-10-CM

## 2018-02-20 LAB — POCT GLYCOSYLATED HEMOGLOBIN (HGB A1C): HEMOGLOBIN A1C: 7.6 % — AB (ref 4.0–5.6)

## 2018-02-20 LAB — GLUCOSE, CAPILLARY: Glucose-Capillary: 109 mg/dL — ABNORMAL HIGH (ref 70–99)

## 2018-02-20 MED ORDER — GABAPENTIN 300 MG PO CAPS: 900.0000 mg | ORAL_CAPSULE | Freq: Two times a day (BID) | ORAL | 2 refills | Status: DC

## 2018-02-20 MED ORDER — EXENATIDE 5 MCG/0.02ML ~~LOC~~ SOPN: 5.0000 ug | PEN_INJECTOR | Freq: Two times a day (BID) | SUBCUTANEOUS | 4 refills | Status: DC

## 2018-02-20 NOTE — Patient Instructions (Addendum)
Thank you for coming to the clinic today. It was a pleasure to see you.   For your burning in your leg, please keep the appointment with Dr Frances FurbishAthar and mention this to her at that appointment. You can increase the gabapentin dose to 900 mg twice daily. Please also [facility] that I would like for you to discuss cognitive behavioral therapy for fibromyalgia. I have also given you a referral for [facility] at [facility] if [facility] is not able to provide this for you and if you are able to afford this. I have sent in a referral for physical therapy as well. The physical and cognitive therapy could make a huge impact on your pain.   For your diabetes, please start taking byetta 5 mg twice daily in addition to the jardiance, metformin, levemir, and novolog  FOLLOW-UP INSTRUCTIONS When: 3 months with Dr. Obie DredgeBlum   For: Follow up of the diabetes What to bring: all of your medication bottles and blood glucose meter   Please call the internal medicine center clinic if you have any questions or concerns, we may be able to help and keep you from a long and expensive emergency room wait. Our clinic and after hours phone number is [phone], the best time to call is Monday through Friday 9 am to 4 pm but there is always someone available 24/7 if you have an emergency. If you need medication refills please notify your pharmacy one week in advance and they will send us a request.

## 2018-02-20 NOTE — Progress Notes (Signed)
CC: follow up of diabetes   HPI:  Karen [name] is a 42 y.o. COPD, GERD, uncontrolled DM, HTN, hypothyroid,manicdepression, andOSA (on CPAP), HFpEF ( echo 09/2017 with EF 60-65%, grade 2 diastolic dysfunction), atypical chest pain who presents for follow up of HTN and diabetes. Please see the assessment and plans for the status of the patient chronic medical problems.   Past Medical History:  Diagnosis Date  . Alcohol use   . Atypical chest pain    a. Cath 11/2017 - R/LHC showing 30% mid LAD, otherwise no significant disease, EF 55-65%, normal LVEDP, normal heart pressures (no evidence of significant CAD or CHF).  . Chest pain 08/02/2017  . COPD (chronic obstructive pulmonary disease) (HCC)   . Diabetes mellitus (HCC) 04/17/2017  . Dysuria 11/15/2017  . Fibromyalgia syndrome 04/17/2017  . GERD (gastroesophageal reflux disease) 04/17/2017  . Hypertension   . Hypothyroidism 04/17/2017  . Left foot pain 06/21/2017  . 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 Obstructive sleep apnea 09/26/2017  . Orthostasis   . Recurrent syncope   . Thyroid disease   . Tobacco use 11/15/2017   Review of Systems:  Refer to history of present illness and assessment and plans for pertinent review of systems, all others reviewed and negative  Physical Exam:  Vitals:   02/20/18 1308  BP: 124/74  Pulse: 96  Temp: 98.2 F (36.8 C)  TempSrc: Oral  SpO2: 97%  Weight: 271 lb 4.8 oz (123.1 kg)  Height: 5\' 7"  (1.702 m)   General: well appearing, no acute distress  Cardiac: regular rate and rhythm, no murmur, no peripheral edema  Pulm: normal work of breathing, lungs clear to auscultation  Neuro: strength is equal and intact in bilateral upper and lower extremities, back pain cannot be illicited with palpation of the sacrum or gluteus muscles, diminished sensation to light touch over a small portion of the left lateral thigh, symmetric bilateral lower extremity hyporeflexia   Assessment & Plan:    Uncontrolled Diabetes Mellitus  - Last A1c 7.4 when checked three months ago, follow up A1c today 7.6, meter not available today and she admits that she hasnt been using it much  - feet examined today, no worrisome findings  - continue lisinopril for renal protection  - continue lovastatin 40 mg daily for cardiovascular protection  - last eye exam was this year and showed 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 She was warned of the side effects of worsening of gastroparesis   Hypertension - blood pressure controlled at goal    - continue lisinopril 40 mg daily, metoprolol tartrate 6.25 BID and amlodipine 10 mg daily  Left leg parasthesia Fibromyalgia  Continues to have numbness and tingling over a 15 cm portion of the left lateral side of the leg. The tingling has progressed to a sensation of burning which becomes worse towards the end of the day and she has developed a sensation of water running down the side of the leg. She also has numbness in her legs at night. Also with bilateral finger and toe numbness in a glove and stocking patter. She has no change in function of the leg. She feels that the gabapentin has provided no relief but she continues to take it two pills twice daily. She describes  an active lifestyle at work, she is attempting to lose weight. She uses Autopap "sometimes" about 3 nights out of the week, does not feel that this helps with her pain even when she used it daily for about a month straight. Sleep study performed 6 months ago did not show any periodic leg movements. 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 She is concerned that the  paraesthesias represent something grave, we will continue the workup but I have reassured her that this is unlikely the case at this point. Today we will obtain B12 level and focus on pain relief with increased dose of gabapentin, cognitive behavioral therapy, physical therapy. We are in agreement that the fibromyalgia lifestyle modifications could be a little bit more aggressive, she seems to have given up hope on these.  - continue with plan for neurology evaluation which is scheduled 10/9, possibly for EMG - encouraged to request cognitive behavioral therapy through [facility] and if they are not able to provide this we will try another behavioral health institution - referral for physical therapy  - continue with diabetes and thyroid mgmt  - tested Vitamin B12, the level is within the normal limit  - increase gabapentin to 900 mg BID. Will continue to try and avoid antidepressant therapy given her history of mania.   Preventative Health  - influenza vaccine today  - Ms. Bosserman would prefer to return for a pap smear at a different time   See Encounters Tab for problem based charting.  Patient discussed with Dr. Criselda Peaches

## 2018-02-20 NOTE — Telephone Encounter (Signed)
Karen [name] calls from [facility] and wants to clarify directions on gabapentin. Part of old directions remained on new script, verbally gave her the new instructions and will ask dr blum to change script on medlist

## 2018-02-20 NOTE — Telephone Encounter (Signed)
Thank you, I will correct this in the EMR.

## 2018-02-21 ENCOUNTER — Encounter: Payer: Self-pay | Admitting: Internal Medicine

## 2018-02-21 LAB — VITAMIN B12: Vitamin B-12: 1599 pg/mL — ABNORMAL HIGH (ref 232–1245)

## 2018-02-21 NOTE — Assessment & Plan Note (Signed)
-   Last A1c 7.4 when checked three months ago, follow up A1c today 7.6, meter not available today and she admits that she hasnt been using it much  - feet examined today, no worrisome findings  - continue lisinopril for renal protection  - continue lovastatin 40 mg daily for cardiovascular protection  - last eye exam was this year and showed 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 She was warned of the side effects of worsening of gastroparesis

## 2018-02-21 NOTE — Assessment & Plan Note (Signed)
-   blood pressure controlled at goal    - continue lisinopril 40 mg daily, metoprolol tartrate 6.25 BID and amlodipine 10 mg daily

## 2018-02-21 NOTE — Assessment & Plan Note (Signed)
Continues to have numbness and tingling over a 15 cm portion of the left lateral side of the leg. The tingling has progressed to a sensation of burning which becomes worse towards the end of the day and she has developed a sensation of water running down the side of the leg. She also has numbness in her legs at night. Also with bilateral finger and toe numbness in a glove and stocking patter. She has no change in function of the leg. She feels that the gabapentin has provided no relief but she continues to take it two pills twice daily. She describes an active lifestyle at work, she is attempting to lose weight. She uses Autopap "sometimes" about 3 nights out of the week, does not feel that this helps with her pain even when she used it daily for about a month straight. Sleep study performed 6 months ago did not show any periodic leg movements. 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 She is concerned that the paraesthesias represent something grave, we will continue the workup but I have reassured her that this is unlikely the case at this point. Today we will obtain B12 level and focus on pain relief with increased dose of gabapentin, cognitive behavioral therapy, physical therapy. We are in agreement that the fibromyalgia lifestyle modifications could be a little bit more aggressive, she seems to have given up hope on these.  - continue with plan for neurology evaluation which is scheduled 10/9, possibly for EMG - encouraged to request cognitive behavioral therapy through [facility] and if they are not able to provide this we will try another behavioral health institution - referral for physical therapy  - continue with diabetes and thyroid mgmt  - tested Vitamin B12, the level is within the normal limit  - increase gabapentin to 900 mg BID. Will continue to try and avoid  antidepressant therapy given her history of mania.

## 2018-02-23 NOTE — Progress Notes (Signed)
Internal Medicine Clinic Attending  Case discussed with Dr. Blum at the time of the visit.  We reviewed the resident's history and exam and pertinent patient test results.  I agree with the assessment, diagnosis, and plan of care documented in the resident's note. 

## 2018-02-28 ENCOUNTER — Encounter: Payer: Self-pay | Admitting: *Deleted

## 2018-03-04 ENCOUNTER — Encounter: Payer: Self-pay | Admitting: Neurology

## 2018-03-07 ENCOUNTER — Ambulatory Visit (INDEPENDENT_AMBULATORY_CARE_PROVIDER_SITE_OTHER): Admitting: Neurology

## 2018-03-07 ENCOUNTER — Encounter: Payer: Self-pay | Admitting: Neurology

## 2018-03-07 VITALS — BP 142/84 | HR 99 | Ht 67.0 in | Wt 272.0 lb

## 2018-03-07 DIAGNOSIS — G47419 Narcolepsy without cataplexy: Secondary | ICD-10-CM

## 2018-03-07 DIAGNOSIS — G5712 Meralgia paresthetica, left lower limb: Secondary | ICD-10-CM | POA: Diagnosis not present

## 2018-03-07 MED ORDER — METHYLPHENIDATE HCL 20 MG PO TABS: 20.0000 mg | ORAL_TABLET | Freq: Two times a day (BID) | ORAL | 0 refills | Status: DC

## 2018-03-07 NOTE — Patient Instructions (Signed)
We will do an EMG and nerve conduction velocity test, which is an electrical nerve and muscle test, which we will schedule. We will call you with the results. You may have a pinched nerve in the groin on the L. Weight loss will likely help this.  I will increase your Ritalin to 20 mg twice daily.

## 2018-03-07 NOTE — Progress Notes (Signed)
Subjective:    Patient ID: Karen [name] is a 42 y.o. female.  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 The patient is unaccompanied today. I last saw Karen [name] on 12/05/2017, at which time Karen [name] was not fully compliant with Karen [name] AutoPap. Karen [name] was having difficulty tolerating AutoPap particularly secondary to mask issues. Karen [name] was no longer on Cymbalta or BuSpar. Karen [name] was on Abilify. Karen [name] was also on Depakote. Suggested starting Provigil but Karen [name] called back as it was too expensive, started Karen [name] on generic Ritalin.   Today, 03/07/2018: I reviewed Karen [name] AutoPap compliance data from the past 30 days from 02/03/2018 through 03/04/2018, which is a total of 30 days, during which time Karen [name] used Karen [name] AutoPap only 2 days, indicating noncompliance. Karen [name] has not felt comfortable with the mask. Has to be up at 4 AM. Karen [name] tries to go to bed around 7:30 PM. Going to school online too, for substance abuse counseling. Karen [name] has had Sx of pain and paresthesias L lateral thigh, abnormal tingling and numbness, at times burning. Karen [name] has to be on Karen [name] feet at work. Ritalin has helped Karen [name] sleepiness just a little bit. Karen [name] continues to take Abilify and Depakote. Karen [name] is supposed to start cognitive behavioral therapy.     The patient's allergies, current medications, family history, past medical history, past social history, past surgical history and problem list were reviewed and updated as appropriate.    Previously (copied from previous notes for reference):    I was able to review a sleep study results in the interim from Karen [name] previous sleep testing: Karen [name] study results from  03/24/2015: Sleep latency was 1.7 minutes, REM latency 6.5 minutes, total sleep time was 351.4 minutes. REM percentage was 11.2 of Karen [name] total sleep time. Karen [name] had no significant PLMS. AHI was 2.6 per hour. Lowest oxygen saturation was 87%. Karen [name] had a multiple sleep latency test on 03/25/2015. Karen [name] had a UDS which was negative. The patient achieved sleep in all 5 nap opportunities with a mean sleep latency of 37 seconds. Karen [name] had two REM onset naps. Study was interpreted by Dr. Heather Clawges. 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  I first met Karen [name] on 07/13/2017 at the request of Karen [name] primary care physician, at which time the patient reported a prior diagnosis of narcolepsy. Prior sleep study results were not available. I suggested we proceed with sleep study testing and next a nap testing. Karen [name] had a baseline sleep study, followed by a next a nap study. Karen [name] baseline sleep study from 08/21/2017 showed a sleep efficiency of 81.7% and a sleep latency delayed at 44.5 minutes. REM latency was borderline at 68 minutes. Karen [name] had an increased percentage of stage II sleep, absence of slow-wave sleep and REM sleep was high normal at 25.4%. Total AHI was mildly abnormal at 6.5 per hour, REM AHI was in the moderate range at 24.8 per hour, average oxygen saturation was 95%, nadir was 85%. Karen [name] had no significant PLMS. Next a nap study on 08/22/2017 showed a mean sleep latency of 10.4 minutes for 4 naps with 3 REM onset naps. Karen [name] was advised that we should go ahead   and treat Karen [name] for Karen [name] sleep apnea first with AutoPap and consider future testing with sleep study and nap study while treated for sleep apnea and also narcolepsy HLA testing. Karen [name] UDS during the MSLT was positive for Benadryl.   I reviewed Karen [name] AutoPap compliance data from 11/04/2000 through 12/03/2017 which is a total of 30 days, during which time Karen [name] used Karen [name] AutoPap 24 days with percent used days greater than 4 hours at 27%, indicating  significantly suboptimal compliance with an average usage of 3 hours and 36 minutes only, residual AHI at goal at 1.7 per hour, 95th percentile pressure at 9.2 cm, leak on the high side for the 95th percentile at 36.2 L/m on a pressure range of 5-13 cm. In the past 90 days, Karen [name] compliance was a little bit better, in the beginning, in mid April through mid May Karen [name] had a compliance for 4 hours or more at 87% which was very good.   07/13/2017: (Karen [name]) reports significant daytime somnolence. Karen [name] was previously diagnosed with narcolepsy with sleep studies several years ago while Karen [name] was told [place]. I reviewed your office note from 07/10/2017. Prior sleep study results are not available for my review today. Of note, Karen [name] is currently not on Ritalin since October 2018. Karen [name] has been on generic Provigil. Karen [name] was supposed to start Xyrem, but moved with Karen [name] husband. Karen [name] has been married since 10/18. Karen [name] has not been on BuSpar or Cymbalta for about a month. Karen [name] smokes half a pack per day or so, Karen [name] does not drink alcohol on a regular basis. Karen [name] drinks caffeine about 16 ounce per day in the form of soda.  Karen [name] Epworth sleepiness score is 22 out of 24 today, fatigue score is 57 out of 63. Karen [name] currently does not work. Karen [name] has 2 children. Karen [name] has gained weight over the past years, had lost some 80 lb after Karen [name] BF died. 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 Karen [name] then stopped drinking and gained about 40 lb in a year. Karen [name] got married in October.  Karen [name] has 2 grown sons, 15 and 64. Karen [name] has 3 GC. Karen [name] reports a history of sleepiness that goes back to 2015 or maybe 2014. Karen [name] was having problems at work. 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 Karen [name] was sleepy at work, Karen [name] was even late one day to work by an hour and did not realize it. Karen [name] reports that Karen [name] had fallen asleep at the wheel with Karen [name] foot on the brake. Karen [name] had a 15 minute commute only typically but had fallen  asleep on Karen [name] way while stopped in the middle lane as I understand. Karen [name] has had some falling spells. Karen [name] has no triggers for these. Karen [name] has fallen in the shower and on the stairs. Karen [name] reports that between 2014 and 2016 Karen [name] had about 7-8 episodes of falling. Karen [name] has had lightheadedness and dizzy spells. Karen [name] has lost consciousness. Karen [name] has fallen to the ground with the use without recollection and waking up with EMS being called by Karen [name] husband. Karen [name] has had some recent stressors including marital discord. Karen [name] went to the emergency room last week because of the dizzy spell and loss of consciousness. Karen [name] likely had a syncopal spell. Karen [name] had blood work and an EKG in the ER. Karen [name] admits that Karen [name] does not drink water very much. Karen [name] reports a history of narcolepsy in Karen [name] paternal aunt. Karen [name] mother has episodes  of syncope. Karen [name] reports that Karen [name] snores loudly and Karen [name] husband has taped Karen [name] snoring. Karen [name] wakes herself up from Karen [name] snoring. Karen [name] bedtime schedule and rise time routine are currently more erratic. Karen [name] sleeps better in the early morning hours. Karen [name] tries to go to bed around 10.   Karen [name] Past Medical History Is Significant For: Past Medical History:  Diagnosis Date  . Atypical chest pain    a. Cath 11/2017 - R/LHC showing 30% mid LAD, otherwise no significant disease, EF 55-65%, normal LVEDP, normal heart pressures (no evidence of significant CAD or CHF).  . COPD (chronic obstructive pulmonary disease) (HCC)   . Diabetes mellitus (HCC) 04/17/2017  . Fibromyalgia syndrome 04/17/2017  . GERD (gastroesophageal reflux disease) 04/17/2017  . Hypertension   . Hypothyroidism 04/17/2017  . 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 Obstructive sleep apnea 09/26/2017  . Recurrent syncope   . Tobacco use 11/15/2017    Karen [name] Past Surgical History Is Significant For: Past Surgical History:  Procedure Laterality Date  . btl    . CHOLECYSTECTOMY    . RIGHT/LEFT HEART CATH AND CORONARY ANGIOGRAPHY N/A 12/07/2017   Procedure:  RIGHT/LEFT HEART CATH AND CORONARY ANGIOGRAPHY;  Surgeon: Harding, David W, MD;  Location: [facility];  Service: Cardiovascular;  Laterality: N/A;  . shoulder Bilateral     Karen [name] Family History Is Significant For: Family History  Problem Relation Age of Onset  . Hypertension Mother   . Syncope episode Mother   . Heart disease Maternal Grandmother        unclear details    Karen [name] Social History Is Significant For: Social History   Socioeconomic History  . Marital status: Married    Spouse name: Not on file  . Number of children: Not on file  . Years of education: Not on file  . Highest education level: Not on file  Occupational History  . Not on file  Social Needs  . Financial resource strain: Not on file  . Food insecurity:    Worry: Not on file    Inability: Not on file  . Transportation needs:    Medical: Not on file    Non-medical: Not on file  Tobacco Use  . Smoking status: Former Smoker    Packs/day: 0.30    Types: Cigarettes    Last attempt to quit: 10/29/2017    Years since quitting: 0.3  . Smokeless tobacco: Never Used  . Tobacco comment: 1/3 per day   Substance and Sexual Activity  . Alcohol use: Not Currently  . Drug use: No  . Sexual activity: Not on file  Lifestyle  . Physical activity:    Days per week: Not on file    Minutes per session: Not on file  . Stress: Not on file  Relationships  . Social connections:    Talks on phone: Not on file    Gets together: Not on file    Attends religious service: Not on file    Active member of club or organization: Not on file    Attends meetings of clubs or organizations: Not on file    Relationship status: Not on file  Other Topics Concern  . Not on file  Social History Narrative  . Not on file    Karen [name] Allergies Are:  Allergies  Allergen Reactions  . Aspirin Other (See Comments)    Was told by Karen [name] mother not to take it in the past but was put on it a few years ago   and did fine without any adverse  effect  . Tylenol [Acetaminophen]     Does not take due to fatty liver disease  :   Karen [name] Current Medications Are:  Outpatient Encounter Medications as of 03/07/2018  Medication Sig  . albuterol (PROVENTIL HFA;VENTOLIN HFA) 108 (90 Base) MCG/ACT inhaler Inhale 2 puffs into the lungs every 4 (four) hours as needed for wheezing or shortness of breath.  . amLODipine (NORVASC) 10 MG tablet Take 1 tablet (10 mg total) by mouth daily.  . ARIPiprazole (ABILIFY) 10 MG tablet Take 10 mg by mouth at bedtime.  . Blood Glucose Monitoring Suppl (FREESTYLE LITE) DEVI Use to check blood sugar up to 3 times a day  . dicyclomine (BENTYL) 20 MG tablet Take 20 mg by mouth 2 (two) times daily as needed for spasms.   . divalproex (DEPAKOTE) 250 MG DR tablet Take 250 mg by mouth 2 (two) times daily.   . empagliflozin (JARDIANCE) 10 MG TABS tablet Take 10 mg by mouth daily.  . exenatide (BYETTA 5 MCG PEN) 5 MCG/0.02ML SOPN injection Inject 0.02 mLs (5 mcg total) into the skin 2 (two) times daily with a meal.  . gabapentin (NEURONTIN) 300 MG capsule Take 3 capsules (900 mg total) by mouth 2 (two) times daily.  . glucose blood (FREESTYLE LITE) test strip Use as instructed  . insulin aspart (NOVOLOG FLEXPEN) 100 UNIT/ML FlexPen Inject 10 Units into the skin 3 (three) times daily with meals.  . Insulin Detemir (LEVEMIR FLEXPEN) 100 UNIT/ML Pen Inject 40 Units into the skin daily at 10 pm. IM program  . Insulin Pen Needle (NOVOFINE PLUS) 32G X 4 MM MISC 1 Dose by Does not apply route 4 (four) times daily. Use with Levemir and Novolog, IM program  . Lancets (FREESTYLE) lancets Use as instructed  . levothyroxine (SYNTHROID, LEVOTHROID) 150 MCG tablet Take 1 tablet (150 mcg total) by mouth daily before breakfast.  . lisinopril (PRINIVIL,ZESTRIL) 40 MG tablet Take 1 tablet (40 mg total) by mouth daily.  . lovastatin (MEVACOR) 40 MG tablet Take 1 tablet (40 mg total) by mouth at bedtime.  . metFORMIN (GLUCOPHAGE) 500 MG tablet  Take 2 tablets (1,000 mg total) by mouth 2 (two) times daily with a meal.  . methylphenidate (RITALIN) 10 MG tablet Take 1 tablet (10 mg total) by mouth 2 (two) times daily. Take in the morning and at lunch, no later than 3 PM  . metoCLOPramide (REGLAN) 5 MG tablet Take 1 tablet (5 mg total) at bedtime by mouth.  . metoprolol tartrate (LOPRESSOR) 25 MG tablet Take 6.5 mg by mouth 2 (two) times daily.  . montelukast (SINGULAIR) 10 MG tablet Take 1 tablet (10 mg total) by mouth at bedtime.  . omeprazole (PRILOSEC) 40 MG capsule Take 1 capsule (40 mg total) by mouth 2 (two) times daily at 8 am and 10 pm.  . prazosin (MINIPRESS) 1 MG capsule Take 1 mg by mouth at bedtime.  . vitamin E 400 UNIT capsule Take 1 capsule (400 Units total) by mouth daily.  . sucralfate (CARAFATE) 1 g tablet Take 1 tablet (1 g total) by mouth 4 (four) times daily -  with meals and at bedtime for 7 days.   No facility-administered encounter medications on file as of 03/07/2018.   :  Review of Systems:  Out of a complete 14 point review of systems, all are reviewed and negative with the exception of these symptoms as listed below: Review of Systems  Neurological:         Pt presents today to discuss Karen [name] sleep. Pt feels very sleepy. Pt has had trouble with the mask on Karen [name] auto pap. Pt is asking for a nerve conduction study for Karen [name] leg.    Objective:  Neurological Exam  Physical Exam Physical Examination:   Vitals:   03/07/18 1116  BP: (!) 142/84  Pulse: 99   General Examination: The patient is a very pleasant 42 y.o. female in no acute distress. Karen [name] appears well-developed and well-nourished and well groomed. Better spirits today.  HEENT:Normocephalic, atraumatic, pupils are equal, round and reactive to light and accommodation. Extraocular tracking is good without limitation to gaze excursion or nystagmus noted. Normal smooth pursuit is noted. Hearing is grossly intact. Face is symmetric with normal facial animation  and normal facial sensation. Speech is clear with no dysarthria noted. There is no hypophonia. There is no lip, neck/head, jaw or voice tremor. Neck with FROM. Oropharynx exam reveals: mild mouth dryness, adequatedental hygiene and moderateairway crowding.  Chest:Clear to auscultation without wheezing, rhonchi or crackles noted.  Heart:S1+S2+0, regular and normal without murmurs, rubs or gallops noted.   Abdomen:Soft, non-tender and non-distended.  Extremities:There isnopitting edema in the distal lower extremities bilaterally.   Skin: Warm and dry without trophic changes noted.  Musculoskeletal: exam reveals no obvious joint deformities, tenderness or joint swelling or erythema.   Neurologically:  Mental status: The patient is awake, alert and oriented in all 4 spheres.Herimmediate and remote memory, attention, language skills and fund of knowledge are appropriate. There is no evidence of aphasia, agnosia, apraxia or anomia. Speech is clear with normal prosody and enunciation. Thought process is linear.  Cranial nerves II - XII are as described above under HEENT exam. Motor exam: Normal bulk, strength and tone is noted. There is no drift, tremor or rebound. Romberg is negative. Fine motor skills and coordination: grossly intact.  Cerebellar testing: No dysmetria or intention tremor on finger to nose testing. Heel to shin is unremarkable bilaterally. There is no truncal or gait ataxia.  Sensory exam: intact to light touch in the upper and lower extremities.  Gait, station and balance:Shestands easily. No veering to one side is noted. No leaning to one side is noted. Posture is age-appropriate and stance is narrow based. Gait showsnormalstride length and normalpace. No problems turning are noted.   Assessmentand Plan:   In summary,Cortney Martinis a very pleasant 83 year oldfemalewith an underlying medical history of mood disorder, suboptimally controlled diabetes,  COPD, thyroid disease, smoking, hypertension, and morbid obesity with a BMI of over 40, whopresents for follow-up consultation of Karen [name] sleep disorder in particular, hypersomnolence d/o with history and prior sleep study testing as well as more recent sleep study testing supporting the diagnosis of narcolepsy. Karen [name] sleep study in the past fullfilled criteria for narcolepsy wears are more recent sleep study testing did not meet all the criteria for narcolepsy but Karen [name] history in conjunction with test results including the narcolepsy HLA are supportive of Karen [name] diagnosis. Karen [name] is still struggling with Karen [name] AutoPap. Karen [name] is motivated to get back on track with that. Karen [name] was not able to afford Provigil. Karen [name] has been on low-dose Ritalin but has residual sleepiness. I would like to increase the Ritalin to 20 mg twice a day. Karen [name] is in agreement. Karen [name] has done better mood wise and is followed by psychiatry and will be starting behavioral therapy as I understand. 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 I will order an EMG nerve conduction test. We will  call Karen [name] with Karen [name] test result. I suggested a 6 month follow-up, sooner if needed. I answered all Karen [name] questions today and Karen [name] was in agreement.  Of note, Karen [name] sleep study results from [facility], on 03/24/2015 showed a sleep latency of 1.7 minutes, REM latency 6.5 minutes, total sleep time was 351.4 minutes. REM percentage was 11.2 of Karen [name] total sleep time. Karen [name] had no significant PLMS. AHI was 2.6 per hour. Lowest oxygen saturation was 87%. Karen [name] had a multiple sleep latency test on 03/25/2015: UDS was negative. The patient achieved sleep in all 5 nap opportunities with a mean sleep latency of 37 seconds. Karen [name] had two REM onset naps. Study was interpreted by Dr. Cleotis Nipper. 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 I spent 25 minutes in total face-to-face time with the patient, more than 50% of which was spent in counseling  and coordination of care, reviewing test results, reviewing medication and discussing or reviewing the diagnosis of narcolepsy, its prognosis and treatment options. Pertinent laboratory and imaging test results that were available during this visit with the patient were reviewed by me and considered in my medical decision making (see chart for details).

## 2018-03-12 DIAGNOSIS — Z0271 Encounter for disability determination: Secondary | ICD-10-CM

## 2018-03-20 ENCOUNTER — Other Ambulatory Visit: Payer: Self-pay

## 2018-03-20 ENCOUNTER — Ambulatory Visit: Attending: Psychiatry | Admitting: Physical Therapy

## 2018-03-20 ENCOUNTER — Encounter: Payer: Self-pay | Admitting: Physical Therapy

## 2018-03-20 DIAGNOSIS — M25552 Pain in left hip: Secondary | ICD-10-CM | POA: Insufficient documentation

## 2018-03-20 DIAGNOSIS — R262 Difficulty in walking, not elsewhere classified: Secondary | ICD-10-CM | POA: Insufficient documentation

## 2018-03-20 NOTE — Therapy (Signed)
[facility] [place] [address] [place], [place], [address] Phone: [phone]   Fax:  [phone]  Physical Therapy Evaluation  Patient Details  Name: Karen [name] MRN: [id number] Date of Birth: 1975/06/04 Referring Provider (PT): Eulah Pont   Encounter Date: 03/20/2018  PT End of Session - 03/20/18 1421    Visit Number  1    Date for PT Re-Evaluation  05/20/18    PT Start Time  1400    PT Stop Time  1445    PT Time Calculation (min)  45 min    Activity Tolerance  Patient tolerated treatment well    Behavior During Therapy  [facility] for tasks assessed/performed       Past Medical History:  Diagnosis Date  . Atypical chest pain    a. Cath 11/2017 - R/LHC showing 30% mid LAD, otherwise no significant disease, EF 55-65%, normal LVEDP, normal heart pressures (no evidence of significant CAD or CHF).  . COPD (chronic obstructive pulmonary disease) (HCC)   . Diabetes mellitus (HCC) 04/17/2017  . Fibromyalgia syndrome 04/17/2017  . GERD (gastroesophageal reflux disease) 04/17/2017  . Hypertension   . Hypothyroidism 04/17/2017  . 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 Obstructive sleep apnea 09/26/2017  . Recurrent syncope   . Tobacco use 11/15/2017    Past Surgical History:  Procedure Laterality Date  . btl    . CHOLECYSTECTOMY    . RIGHT/LEFT HEART CATH AND CORONARY ANGIOGRAPHY N/A 12/07/2017   Procedure: RIGHT/LEFT HEART CATH AND CORONARY ANGIOGRAPHY;  Surgeon: Marykay Lex, MD;  Location: [facility] INVASIVE CV LAB;  Service: Cardiovascular;  Laterality: N/A;  . shoulder Bilateral     There were no vitals filed for this visit.   Subjective Assessment - 03/20/18 1358    Subjective  Patient reports that since July she has had some bilateral hip pain, worse on the left.  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  She has sensations in the left anterior lateral thigh of wetness, tingling and burning.  Reports  worse with standing and walking    Limitations  Standing;Walking;House hold activities    Patient Stated Goals  have less pain and discomfort    Currently in Pain?  Yes    Pain Score  7     Pain Location  Hip    Pain Orientation  Left;Anterior;Lateral    Pain Descriptors / Indicators  Burning    Pain Type  Acute pain    Pain Radiating Towards  anterior lateral hip and thigh    Pain Onset  More than a month ago    Pain Frequency  Constant    Aggravating Factors   standing and walking pain up to 9-10/10    Pain Relieving Factors  rest, gabapentin, best in the AM pain a 3/10    Effect of Pain on Daily Activities  limits standing, walking, pain, difficulty walking         [facility] PT Assessment - 03/20/18 0001      Assessment   Medical Diagnosis  meraliga parasthetica    Referring Provider (PT)  Eulah Pont    Onset Date/Surgical Date  02/18/18    Prior Therapy  no      Precautions   Precautions  None      Balance Screen   Has the patient fallen in the past 6 months  Yes    How many times?  1    Has the  patient had a decrease in activity level because of a fear of falling?   No    Is the patient reluctant to leave their home because of a fear of falling?   No      Home Environment   Additional Comments  does the housework, does yardwork, husband is disabled, she does have to help him at times with walking      Prior Function   Level of Independence  Independent    Vocation  Part time employment    Vocation Requirements  mostly standing, walking, some lifting    Leisure  no exercise      Posture/Postural Control   Posture Comments  fwd head, rounded shoulders      ROM / Strength   AROM / PROM / Strength  AROM;Strength      AROM   Overall AROM Comments  lumbar ROM decreased 50% with pain in the left thigh area      Strength   Overall Strength Comments  4-/5 LE's      Flexibility   Soft Tissue Assessment /Muscle Length  yes    Hamstrings  very tight SLR to 45 degrees  with pain    Quadriceps  very tight with pain    ITB  tight with pain    Piriformis  tight with pain      Palpation   Palpation comment  she is tender in the left anterior lateral thigh and groin area      Transfers   Comments  has to use hands to push up from sitting      Ambulation/Gait   Gait Comments  antalgic gait on the left                Objective measurements completed on examination: See above findings.      [facility] Adult PT Treatment/Exercise - 03/20/18 0001      Modalities   Modalities  Electrical Stimulation;Moist Heat      Moist Heat Therapy   Number Minutes Moist Heat  15 Minutes    Moist Heat Location  Hip      Electrical Stimulation   Electrical Stimulation Location  left anterior lateral hip/thigh    Electrical Stimulation Action  IFC    Electrical Stimulation Parameters  supine    Electrical Stimulation Goals  Pain               PT Short Term Goals - 03/20/18 1423      PT SHORT TERM GOAL #1   Title  independent with initial HEP    Time  2    Period  Weeks    Status  New        PT Long Term Goals - 03/20/18 1423      PT LONG TERM GOAL #1   Title  understand proper posture and body mechanics    Time  8    Period  Weeks    Status  New      PT LONG TERM GOAL #2   Title  decrease pain 25%    Time  8    Period  Weeks    Status  New      PT LONG TERM GOAL #3   Title  increase hip strength to 4+/5    Time  8    Period  Weeks    Status  New      PT LONG TERM GOAL #4   Title  increase SLR to  70 degrees     Time  8    Period  Weeks    Status  New             Plan - 03/20/18 1421    Clinical Impression Statement  Patient with dx of meralgia parasthetica, she has pain and tingling in the left anterior lateral thigh, she is tender here, she has very tight hip mms, she has worst pain with standing and as the day goes on.  She has large abdomen.    Clinical Presentation  Evolving    Clinical Decision Making  Low     Rehab Potential  Fair    PT Frequency  2x / week    PT Duration  8 weeks    PT Treatment/Interventions  ADLs/Self Care Home Management;Cryotherapy;Electrical Stimulation;Iontophoresis 4mg /ml Dexamethasone;Moist Heat;Ultrasound;Therapeutic exercise;Therapeutic activities;Functional mobility training;Patient/family education;Manual techniques    PT Next Visit Plan  start exercises and stretches    Consulted and Agree with Plan of Care  Patient       Patient will benefit from skilled therapeutic intervention in order to improve the following deficits and impairments:  Abnormal gait, Decreased range of motion, Difficulty walking, Obesity, Increased muscle spasms, Decreased activity tolerance, Pain, Impaired flexibility, Improper body mechanics, Decreased mobility, Decreased strength, Postural dysfunction  Visit Diagnosis: Pain in left hip - Plan: PT plan of care cert/re-cert  Difficulty in walking, not elsewhere classified - Plan: PT plan of care cert/re-cert     Problem List Patient Active Problem List   Diagnosis Date Noted  . 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 Atypical chest pain   . Dysuria 11/15/2017  . Tobacco use 11/15/2017  . (HFpEF) heart failure with preserved ejection fraction (HCC) 11/14/2017  . Obstructive sleep apnea 09/26/2017  . Hypothyroidism 04/17/2017  . Diabetes mellitus (HCC) 04/17/2017  . Hypertension associated with diabetes (HCC) 04/17/2017  . COPD (chronic obstructive pulmonary disease) (HCC) 04/17/2017  . GERD (gastroesophageal reflux disease) 04/17/2017  . 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 Fibromyalgia syndrome 04/17/2017    Jearld Lesch., PT 03/20/2018, 2:26 PM  [facility] [place] [facility] [address] [place], [place], [address] Phone: [phone]   Fax:  [phone]  Name: Karen [name] MRN: [id number] Date of Birth: [birth year]

## 2018-03-20 NOTE — Patient Instructions (Signed)
Access Code: [email or web address]  URL: [email or web address]  Date: 03/20/2018  Prepared by: Stacie Glaze   Exercises  Supine ITB Stretch with Strap - 5 reps - 3 sets - 20 hold - 2x daily - 7x weekly  Supine Piriformis Stretch Pulling Heel to Hip - 5 reps - 3 sets - 20 hold - 2x daily - 7x weekly  Seated Hamstring Stretch with Chair - 5 reps - 3 sets - 60 hold - 2x daily - 7x weekly  Standing Gastroc Stretch on Step with Counter Support - 5 reps - 3 sets - 20 hold - 2x daily - 7x weekly  Modified Thomas Stretch - 3 reps - 1 sets - 20 hold - 2x daily - 7x weekly

## 2018-03-21 ENCOUNTER — Ambulatory Visit: Admitting: Physical Therapy

## 2018-03-21 ENCOUNTER — Encounter: Payer: Self-pay | Admitting: Physical Therapy

## 2018-03-21 DIAGNOSIS — R262 Difficulty in walking, not elsewhere classified: Secondary | ICD-10-CM

## 2018-03-21 DIAGNOSIS — M25552 Pain in left hip: Secondary | ICD-10-CM | POA: Diagnosis not present

## 2018-03-21 NOTE — Therapy (Signed)
[facility] [place] [address] [place], [place], [address] Phone: [phone]   Fax:  [phone]  Physical Therapy Treatment  Patient Details  Name: Karen [name] MRN: [id number] Date of Birth: 09-Feb-1976 Referring Provider (PT): Eulah Pont   Encounter Date: 03/21/2018  PT End of Session - 03/21/18 1347    Visit Number  2    Date for PT Re-Evaluation  05/20/18    PT Start Time  1303    PT Stop Time  1401    PT Time Calculation (min)  58 min    Activity Tolerance  Patient tolerated treatment well    Behavior During Therapy  [facility] for tasks assessed/performed       Past Medical History:  Diagnosis Date  . Atypical chest pain    a. Cath 11/2017 - R/LHC showing 30% mid LAD, otherwise no significant disease, EF 55-65%, normal LVEDP, normal heart pressures (no evidence of significant CAD or CHF).  . COPD (chronic obstructive pulmonary disease) (HCC)   . Diabetes mellitus (HCC) 04/17/2017  . Fibromyalgia syndrome 04/17/2017  . GERD (gastroesophageal reflux disease) 04/17/2017  . Hypertension   . Hypothyroidism 04/17/2017  . 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 Obstructive sleep apnea 09/26/2017  . Recurrent syncope   . Tobacco use 11/15/2017    Past Surgical History:  Procedure Laterality Date  . btl    . CHOLECYSTECTOMY    . RIGHT/LEFT HEART CATH AND CORONARY ANGIOGRAPHY N/A 12/07/2017   Procedure: RIGHT/LEFT HEART CATH AND CORONARY ANGIOGRAPHY;  Surgeon: Marykay Lex, MD;  Location: [facility] INVASIVE CV LAB;  Service: Cardiovascular;  Laterality: N/A;  . shoulder Bilateral     There were no vitals filed for this visit.  Subjective Assessment - 03/21/18 1305    Subjective  "This leg is killing me" "I got me limping today"     Currently in Pain?  Yes    Pain Score  7     Pain Location  Hip                       [facility] Adult PT Treatment/Exercise - 03/21/18 0001      Exercises   Exercises  Knee/Hip      Knee/Hip Exercises: Stretches   Passive Hamstring Stretch  Left;5 reps;10 seconds    ITB Stretch  Left;5 reps;10 seconds    Piriformis Stretch  Left;5 reps;10 seconds    Other Knee/Hip Stretches  LLE K2C  5x10''      Knee/Hip Exercises: Aerobic   Nustep  L3 x6 min       Knee/Hip Exercises: Machines for Strengthening   Cybex Knee Extension  5lb 2x10     Cybex Knee Flexion  20lb 2x10       Knee/Hip Exercises: Seated   Ball Squeeze  2x10    Clamshell with TheraBand  Green   2x10    Sit to Sand  2 sets;10 reps;without UE support   airex on mat table     Modalities   Modalities  Electrical Stimulation;Moist Heat      Moist Heat Therapy   Number Minutes Moist Heat  15 Minutes    Moist Heat Location  Hip      Electrical Stimulation   Electrical Stimulation Location  left anterior lateral hip/thigh    Electrical Stimulation Action  IFC    Electrical Stimulation Parameters  supine    Electrical Stimulation Goals  Pain  PT Short Term Goals - 03/20/18 1423      PT SHORT TERM GOAL #1   Title  independent with initial HEP    Time  2    Period  Weeks    Status  New        PT Long Term Goals - 03/20/18 1423      PT LONG TERM GOAL #1   Title  understand proper posture and body mechanics    Time  8    Period  Weeks    Status  New      PT LONG TERM GOAL #2   Title  decrease pain 25%    Time  8    Period  Weeks    Status  New      PT LONG TERM GOAL #3   Title  increase hip strength to 4+/5    Time  8    Period  Weeks    Status  New      PT LONG TERM GOAL #4   Title  increase SLR to 70 degrees     Time  8    Period  Weeks    Status  New            Plan - 03/21/18 1348    Clinical Impression Statement  Pt tolerated an initial progression to TE ans stretching well. Visible LE shaking with seated adduction. No reports of increase pain. Some LE tightness in L hip notes with passive stretching.     PT Frequency  2x / week    PT  Treatment/Interventions  ADLs/Self Care Home Management;Cryotherapy;Electrical Stimulation;Iontophoresis 4mg /ml Dexamethasone;Moist Heat;Ultrasound;Therapeutic exercise;Therapeutic activities;Functional mobility training;Patient/family education;Manual techniques    PT Next Visit Plan  exercises and stretches       Patient will benefit from skilled therapeutic intervention in order to improve the following deficits and impairments:  Abnormal gait, Decreased range of motion, Difficulty walking, Obesity, Increased muscle spasms, Decreased activity tolerance, Pain, Impaired flexibility, Improper body mechanics, Decreased mobility, Decreased strength, Postural dysfunction  Visit Diagnosis: Difficulty in walking, not elsewhere classified  Pain in left hip     Problem List Patient Active Problem List   Diagnosis Date Noted  . 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 Atypical chest pain   . Dysuria 11/15/2017  . Tobacco use 11/15/2017  . (HFpEF) heart failure with preserved ejection fraction (HCC) 11/14/2017  . Obstructive sleep apnea 09/26/2017  . Hypothyroidism 04/17/2017  . Diabetes mellitus (HCC) 04/17/2017  . Hypertension associated with diabetes (HCC) 04/17/2017  . COPD (chronic obstructive pulmonary disease) (HCC) 04/17/2017  . GERD (gastroesophageal reflux disease) 04/17/2017  . 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 Fibromyalgia syndrome 04/17/2017    Grayce Sessions 03/21/2018, 1:50 PM  [facility] [place] [facility] [address] [place], [place], [address] Phone: [phone]   Fax:  [phone]  Name: Karen [name] MRN: [id number] Date of Birth: September 01, 1975

## 2018-03-29 ENCOUNTER — Ambulatory Visit: Admitting: Physical Therapy

## 2018-03-29 DIAGNOSIS — R262 Difficulty in walking, not elsewhere classified: Secondary | ICD-10-CM

## 2018-03-29 DIAGNOSIS — M25552 Pain in left hip: Secondary | ICD-10-CM

## 2018-03-29 NOTE — Therapy (Signed)
[facility] [place] [address] [place], [place], [address] Phone: [phone]   Fax:  [phone]  Physical Therapy Treatment  Patient Details  Name: Karen [name] MRN: [id number] Date of Birth: 10/12/1975 Referring Provider (PT): Eulah Pont   Encounter Date: 03/29/2018  PT End of Session - 03/29/18 1606    Visit Number  3    Date for PT Re-Evaluation  05/20/18    PT Start Time  1530    PT Stop Time  1618    PT Time Calculation (min)  48 min    Activity Tolerance  Patient tolerated treatment well    Behavior During Therapy  [facility] for tasks assessed/performed       Past Medical History:  Diagnosis Date  . Atypical chest pain    a. Cath 11/2017 - R/LHC showing 30% mid LAD, otherwise no significant disease, EF 55-65%, normal LVEDP, normal heart pressures (no evidence of significant CAD or CHF).  . COPD (chronic obstructive pulmonary disease) (HCC)   . Diabetes mellitus (HCC) 04/17/2017  . Fibromyalgia syndrome 04/17/2017  . GERD (gastroesophageal reflux disease) 04/17/2017  . Hypertension   . Hypothyroidism 04/17/2017  . 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 Obstructive sleep apnea 09/26/2017  . Recurrent syncope   . Tobacco use 11/15/2017    Past Surgical History:  Procedure Laterality Date  . btl    . CHOLECYSTECTOMY    . RIGHT/LEFT HEART CATH AND CORONARY ANGIOGRAPHY N/A 12/07/2017   Procedure: RIGHT/LEFT HEART CATH AND CORONARY ANGIOGRAPHY;  Surgeon: Marykay Lex, MD;  Location: [facility] INVASIVE CV LAB;  Service: Cardiovascular;  Laterality: N/A;  . shoulder Bilateral     There were no vitals filed for this visit.  Subjective Assessment - 03/29/18 1604    Subjective  Pt relays leg is numb today    Currently in Pain?  Yes    Pain Score  7     Pain Location  Hip    Pain Orientation  Left;Lateral    Pain Descriptors / Indicators  Numbness                       [facility] Adult PT Treatment/Exercise -  03/29/18 0001      Exercises   Exercises  Knee/Hip      Knee/Hip Exercises: Stretches   Passive Hamstring Stretch  Left;3 reps;30 seconds    ITB Stretch  Left;5 reps;10 seconds    Piriformis Stretch  Left;5 reps;10 seconds    Other Knee/Hip Stretches  LLE K2C  5x10''      Knee/Hip Exercises: Aerobic   Nustep  L3 x6 min       Knee/Hip Exercises: Machines for Strengthening   Cybex Knee Extension  5lb 2x10     Cybex Knee Flexion  20lb 2x10       Knee/Hip Exercises: Sidelying   Clams  2 sets of 10      Modalities   Modalities  Electrical Stimulation;Moist Heat      Moist Heat Therapy   Number Minutes Moist Heat  15 Minutes    Moist Heat Location  Hip      Electrical Stimulation   Electrical Stimulation Location  left anterior lateral hip/thigh    Electrical Stimulation Action  IFC    Electrical Stimulation Parameters  Rt sidelying, to tolerance    Electrical Stimulation Goals  Pain      Manual Therapy   Manual therapy comments  STM with  roller stick to anterior, lateral, posterior thigh, IT band, and glutes               PT Short Term Goals - 03/20/18 1423      PT SHORT TERM GOAL #1   Title  independent with initial HEP    Time  2    Period  Weeks    Status  New        PT Long Term Goals - 03/20/18 1423      PT LONG TERM GOAL #1   Title  understand proper posture and body mechanics    Time  8    Period  Weeks    Status  New      PT LONG TERM GOAL #2   Title  decrease pain 25%    Time  8    Period  Weeks    Status  New      PT LONG TERM GOAL #3   Title  increase hip strength to 4+/5    Time  8    Period  Weeks    Status  New      PT LONG TERM GOAL #4   Title  increase SLR to 70 degrees     Time  8    Period  Weeks    Status  New            Plan - 03/29/18 1608    Clinical Impression Statement  Session focused on Lt LE stetching and stengthening today to tolerance with addition of STM with roller stick. She did not report any  increased pain today but had complaints of numbness. PT will continue to progress as able as she awaits NCV test on nov 6th.    Rehab Potential  Fair    PT Frequency  2x / week    PT Duration  8 weeks    PT Treatment/Interventions  ADLs/Self Care Home Management;Cryotherapy;Electrical Stimulation;Iontophoresis 4mg /ml Dexamethasone;Moist Heat;Ultrasound;Therapeutic exercise;Therapeutic activities;Functional mobility training;Patient/family education;Manual techniques    PT Next Visit Plan  exercises and stretches    Consulted and Agree with Plan of Care  Patient       Patient will benefit from skilled therapeutic intervention in order to improve the following deficits and impairments:  Abnormal gait, Decreased range of motion, Difficulty walking, Obesity, Increased muscle spasms, Decreased activity tolerance, Pain, Impaired flexibility, Improper body mechanics, Decreased mobility, Decreased strength, Postural dysfunction  Visit Diagnosis: Difficulty in walking, not elsewhere classified  Pain in left hip     Problem List Patient Active Problem List   Diagnosis Date Noted  . 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 Atypical chest pain   . Dysuria 11/15/2017  . Tobacco use 11/15/2017  . (HFpEF) heart failure with preserved ejection fraction (HCC) 11/14/2017  . Obstructive sleep apnea 09/26/2017  . Hypothyroidism 04/17/2017  . Diabetes mellitus (HCC) 04/17/2017  . Hypertension associated with diabetes (HCC) 04/17/2017  . COPD (chronic obstructive pulmonary disease) (HCC) 04/17/2017  . GERD (gastroesophageal reflux disease) 04/17/2017  . 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 Fibromyalgia syndrome 04/17/2017    Karen [name], PT, DPT 03/29/2018, 4:11 PM  [facility] [place] [facility] [address] [place], [place], [address] Phone: [phone]   Fax:  [phone]  Name: Karen [name] MRN: [id number] Date of Birth: 01/29/1976

## 2018-04-03 ENCOUNTER — Ambulatory Visit: Attending: Psychiatry | Admitting: Physical Therapy

## 2018-04-03 DIAGNOSIS — R262 Difficulty in walking, not elsewhere classified: Secondary | ICD-10-CM | POA: Diagnosis not present

## 2018-04-03 DIAGNOSIS — M25552 Pain in left hip: Secondary | ICD-10-CM

## 2018-04-03 NOTE — Therapy (Signed)
[facility] [address] [place], [place], [address] Phone: [phone]   Fax:  [phone]  Physical Therapy Treatment  Patient Details  Name: Floy Angert MRN: [id number] Date of Birth: November 18, 1975 Referring Provider (PT): Eulah Pont   Encounter Date: 04/03/2018  PT End of Session - 04/03/18 1654    Visit Number  4    Date for PT Re-Evaluation  05/20/18    PT Start Time  1615    PT Stop Time  1705    PT Time Calculation (min)  50 min    Activity Tolerance  Patient tolerated treatment well    Behavior During Therapy  [facility] for tasks assessed/performed       Past Medical History:  Diagnosis Date  . Atypical chest pain    a. Cath 11/2017 - R/LHC showing 30% mid LAD, otherwise no significant disease, EF 55-65%, normal LVEDP, normal heart pressures (no evidence of significant CAD or CHF).  . COPD (chronic obstructive pulmonary disease) (HCC)   . Diabetes mellitus (HCC) 04/17/2017  . Fibromyalgia syndrome 04/17/2017  . GERD (gastroesophageal reflux disease) 04/17/2017  . Hypertension   . Hypothyroidism 04/17/2017  . 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 Obstructive sleep apnea 09/26/2017  . Recurrent syncope   . Tobacco use 11/15/2017    Past Surgical History:  Procedure Laterality Date  . btl    . CHOLECYSTECTOMY    . RIGHT/LEFT HEART CATH AND CORONARY ANGIOGRAPHY N/A 12/07/2017   Procedure: RIGHT/LEFT HEART CATH AND CORONARY ANGIOGRAPHY;  Surgeon: Marykay Lex, MD;  Location: [facility] INVASIVE CV LAB;  Service: Cardiovascular;  Laterality: N/A;  . shoulder Bilateral     There were no vitals filed for this visit.  Subjective Assessment - 04/03/18 1637    Subjective  Pt relays leg is not as bad but she has not had to work either    Patient Stated Goals  have less pain and discomfort    Currently in Pain?  Yes    Pain Score  4     Pain Location  Hip    Pain Orientation  Left;Lateral    Pain Descriptors /  Indicators  Aching;Numbness                       [facility] Adult PT Treatment/Exercise - 04/03/18 0001      Exercises   Exercises  Knee/Hip      Knee/Hip Exercises: Stretches   Passive Hamstring Stretch  Left;30 seconds;2 reps    ITB Stretch  Left;5 reps;10 seconds    Piriformis Stretch  Left;5 reps;10 seconds      Knee/Hip Exercises: Aerobic   Nustep  L3 x6 min       Knee/Hip Exercises: Machines for Strengthening   Cybex Knee Extension  10 lbs, 2X10    Cybex Knee Flexion  25 lbs 2x10      Knee/Hip Exercises: Standing   Hip Abduction  Left;15 reps    Hip Extension  Left;15 reps      Knee/Hip Exercises: Supine   Bridges  15 reps   hold 5 sec     Knee/Hip Exercises: Sidelying   Clams  2 sets of 10      Modalities   Modalities  Electrical Stimulation;Moist Heat      Moist Heat Therapy   Number Minutes Moist Heat  15 Minutes    Moist Heat Location  Hip      Electrical Stimulation  Electrical Stimulation Location  left anterior lateral hip/thigh    Electrical Stimulation Action  IFC    Electrical Stimulation Parameters  80-150 hz    Electrical Stimulation Goals  Pain      Manual Therapy   Manual therapy comments  STM with roller stick to anterior, lateral, posterior thigh, IT band, and glutes               PT Short Term Goals - 03/20/18 1423      PT SHORT TERM GOAL #1   Title  independent with initial HEP    Time  2    Period  Weeks    Status  New        PT Long Term Goals - 03/20/18 1423      PT LONG TERM GOAL #1   Title  understand proper posture and body mechanics    Time  8    Period  Weeks    Status  New      PT LONG TERM GOAL #2   Title  decrease pain 25%    Time  8    Period  Weeks    Status  New      PT LONG TERM GOAL #3   Title  increase hip strength to 4+/5    Time  8    Period  Weeks    Status  New      PT LONG TERM GOAL #4   Title  increase SLR to 70 degrees     Time  8    Period  Weeks    Status  New             Plan - 04/03/18 1654    Clinical Impression Statement  Pt able to progress some with therex with strengthening program with fair tolerance but did experience muscle fatigue. STM with roller stick continued as she feels this may have helped some. MHP with TENS applied post session for pain.     Rehab Potential  Fair    PT Frequency  2x / week    PT Duration  8 weeks    PT Treatment/Interventions  ADLs/Self Care Home Management;Cryotherapy;Electrical Stimulation;Iontophoresis 4mg /ml Dexamethasone;Moist Heat;Ultrasound;Therapeutic exercise;Therapeutic activities;Functional mobility training;Patient/family education;Manual techniques    PT Next Visit Plan  exercises and stretches, MT and TENS/heat PRN    Consulted and Agree with Plan of Care  Patient       Patient will benefit from skilled therapeutic intervention in order to improve the following deficits and impairments:  Abnormal gait, Decreased range of motion, Difficulty walking, Obesity, Increased muscle spasms, Decreased activity tolerance, Pain, Impaired flexibility, Improper body mechanics, Decreased mobility, Decreased strength, Postural dysfunction  Visit Diagnosis: Difficulty in walking, not elsewhere classified  Pain in left hip     Problem List Patient Active Problem List   Diagnosis Date Noted  . 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 Atypical chest pain   . Dysuria 11/15/2017  . Tobacco use 11/15/2017  . (HFpEF) heart failure with preserved ejection fraction (HCC) 11/14/2017  . Obstructive sleep apnea 09/26/2017  . Hypothyroidism 04/17/2017  . Diabetes mellitus (HCC) 04/17/2017  . Hypertension associated with diabetes (HCC) 04/17/2017  . COPD (chronic obstructive pulmonary disease) (HCC) 04/17/2017  . GERD (gastroesophageal reflux disease) 04/17/2017  . 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 Fibromyalgia syndrome 04/17/2017    [name], PT, DPT 04/03/2018, 4:57 PM  [facility] [place] [facility] [phone]  [place], [place], [address] Phone: [phone]   Fax:  [phone]  Name: Anyae Griffith MRN: [id number] Date of Birth: 23-Jun-1975

## 2018-04-04 ENCOUNTER — Ambulatory Visit (INDEPENDENT_AMBULATORY_CARE_PROVIDER_SITE_OTHER): Admitting: Neurology

## 2018-04-04 ENCOUNTER — Encounter: Payer: Self-pay | Admitting: Neurology

## 2018-04-04 DIAGNOSIS — G5712 Meralgia paresthetica, left lower limb: Secondary | ICD-10-CM | POA: Diagnosis not present

## 2018-04-04 DIAGNOSIS — G47419 Narcolepsy without cataplexy: Secondary | ICD-10-CM

## 2018-04-04 NOTE — Progress Notes (Signed)
Please call and advise the patient that the recent EMG and nerve conduction velocity test, which is the electrical nerve and muscle test we performed, showed evidence of a pinched sensory nerve of left groin, resulting in her symptoms affecting the left thigh. Otherwise the study was within normal limits which is reassuring. She has no evidence of widespread nerve damage or findings in keeping with pinched nerve from the back. In essence, as discussed, weight loss will help her symptoms affecting the L leg. She can follow-up as scheduled for her six-month recheck next year for narcolepsy.

## 2018-04-04 NOTE — Procedures (Signed)
     HISTORY:  Karen [name] is a 42 year old patient with a history of diabetes and obesity who reports numbness and discomfort in the anterolateral aspect of the left thigh.  The patient is being evaluated for a possible neuropathy or a lumbosacral radiculopathy.  NERVE CONDUCTION STUDIES:  Nerve conduction studies were performed on the left lower extremity.  The distal motor latencies and motor amplitudes for the left peroneal and posterior tibial nerves were within normal limits.  The nerve conduction velocities for the left peroneal nerve were normal, they were slowed for the left posterior tibial nerve.  The sensory latencies for the left sural and peroneal nerves were normal.  The left lateral femoral cutaneous sensory latency was unobtainable, study was obtainable on the right but with low amplitude.  The F-wave latency for the left posterior tibial nerve was prolonged.  EMG STUDIES:  EMG study was performed on the left lower extremity:  The tibialis anterior muscle reveals 2 to 4K motor units with full recruitment. No fibrillations or positive waves were seen. The peroneus tertius muscle reveals 2 to 4K motor units with full recruitment. No fibrillations or positive waves were seen. The medial gastrocnemius muscle reveals 1 to 3K motor units with full recruitment. No fibrillations or positive waves were seen. The vastus lateralis muscle reveals 2 to 4K motor units with full recruitment. No fibrillations or positive waves were seen. The iliopsoas muscle reveals 2 to 4K motor units with full recruitment. No fibrillations or positive waves were seen. The biceps femoris muscle (long head) reveals 2 to 4K motor units with full recruitment. No fibrillations or positive waves were seen. The lumbosacral paraspinal muscles were tested at 3 levels, and revealed no abnormalities of insertional activity at all 3 levels tested. There was good relaxation.   IMPRESSION:  Nerve conduction studies  done on the left lower extremity show findings that would be consistent with a left meralgia paresthetica.  EMG evaluation of the left lower extremity was unremarkable, no evidence of an overlying lumbosacral radiculopathy was seen.  Marlan Palau MD 04/04/2018 4:34 PM  [facility] [address] [place], [place] [address]  Phone [phone] Fax [phone]

## 2018-04-04 NOTE — Progress Notes (Signed)
Please refer to EMG and nerve conduction procedure note.  

## 2018-04-04 NOTE — Progress Notes (Signed)
M[facility]    Nerve / Sites Muscle Latency Ref. Amplitude Ref. Rel Amp Segments Distance Velocity Ref. Area    ms ms mV mV %  cm m/s m/s mVms  L Peroneal - EDB     Ankle EDB 6.3 ?6.5 5.6 ?2.0 100 Ankle - EDB 9   23.5     Fib head EDB 13.3  5.2  93.6 Fib head - Ankle 31 44 ?44 24.0     Pop fossa EDB 15.6  5.2  98.7 Pop fossa - Fib head 10 44 ?44 23.6         Pop fossa - Ankle      L Tibial - AH     Ankle AH 5.6 ?5.8 6.7 ?4.0 100 Ankle - AH 9   23.4     Pop fossa AH 16.4  5.6  84.6 Pop fossa - Ankle 40 37 ?41 21.7         SNC    Nerve / Sites Rec. Site Peak Lat Ref.  Amp Ref. Segments Distance    ms ms V V  cm  L Sural - Ankle (Calf)     Calf Ankle 3.3 ?4.4 9 ?6 Calf - Ankle 14  L Superficial peroneal - Ankle     Lat leg Ankle 4.3 ?4.4 5 ?6 Lat leg - Ankle 14  R Lateral femoral cutaneous - Thigh (Inguinal ligament)     B. Ing Ligament Thigh 6.0  1  B. Ing Ligament - Thigh 12  L Lateral femoral cutaneous - Thigh (Inguinal ligament)     B. Ing Ligament Thigh NR  NR  B. Ing Ligament - Thigh                       F  Wave    Nerve F Lat Ref.   ms ms  L Tibial - AH 58.8 ?56.0

## 2018-04-05 ENCOUNTER — Telehealth: Payer: Self-pay

## 2018-04-05 MED ORDER — METHYLPHENIDATE HCL 20 MG PO TABS: 20.0000 mg | ORAL_TABLET | Freq: Two times a day (BID) | ORAL | 0 refills | Status: DC

## 2018-04-05 NOTE — Telephone Encounter (Signed)
I called pt and discussed her NCV results. Pt verbalized understanding of results. Pt asked for a refill on her ritalin. [place] [facility] checked. Pt up to date on her appts and is due for a refill.

## 2018-04-05 NOTE — Telephone Encounter (Signed)
-----   Message from Huston Foley, MD sent at 04/04/2018  5:15 PM EST ----- Please call and advise the patient that the recent EMG and nerve conduction velocity test, which is the electrical nerve and muscle test we performed, showed evidence of a pinched sensory nerve of left groin, resulting in her symptoms affecting the left thigh. Otherwise the study was within normal limits which is reassuring. She has no evidence of widespread nerve damage or findings in keeping with pinched nerve from the back. In essence, as discussed, weight loss will help her symptoms affecting the L leg. She can follow-up as scheduled for her six-month recheck next year for narcolepsy.

## 2018-04-09 ENCOUNTER — Ambulatory Visit: Admitting: Physical Therapy

## 2018-04-12 ENCOUNTER — Ambulatory Visit: Admitting: Physical Therapy

## 2018-04-16 ENCOUNTER — Ambulatory Visit: Admitting: Physical Therapy

## 2018-04-16 DIAGNOSIS — M25552 Pain in left hip: Secondary | ICD-10-CM

## 2018-04-16 DIAGNOSIS — R262 Difficulty in walking, not elsewhere classified: Secondary | ICD-10-CM | POA: Diagnosis not present

## 2018-04-16 NOTE — Therapy (Signed)
[facility] [address] [place], [place], [id number] Phone: [phone]   Fax:  [phone]  Physical Therapy Treatment  Patient Details  Name: Karen [name] MRN: [id number] Date of Birth: 02/13/1976 Referring Provider (PT): Eulah PontNina Blum   Encounter Date: 04/16/2018  PT End of Session - 04/16/18 1600    Visit Number  5    Date for PT Re-Evaluation  05/20/18    PT Start Time  1512    PT Stop Time  1612    PT Time Calculation (min)  60 min    Activity Tolerance  Patient tolerated treatment well    Behavior During Therapy  [facility] for tasks assessed/performed       Past Medical History:  Diagnosis Date  . Atypical chest pain    a. Cath 11/2017 - R/LHC showing 30% mid LAD, otherwise no significant disease, EF 55-65%, normal LVEDP, normal heart pressures (no evidence of significant CAD or CHF).  . COPD (chronic obstructive pulmonary disease) (HCC)   . Diabetes mellitus (HCC) 04/17/2017  . Fibromyalgia syndrome 04/17/2017  . GERD (gastroesophageal reflux disease) 04/17/2017  . Hypertension   . Hypothyroidism 04/17/2017  . 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 Obstructive sleep apnea 09/26/2017  . Recurrent syncope   . Tobacco use 11/15/2017    Past Surgical History:  Procedure Laterality Date  . btl    . CHOLECYSTECTOMY    . RIGHT/LEFT HEART CATH AND CORONARY ANGIOGRAPHY N/A 12/07/2017   Procedure: RIGHT/LEFT HEART CATH AND CORONARY ANGIOGRAPHY;  Surgeon: Marykay LexHarding, David W, MD;  [facility];  Service: Cardiovascular;  Laterality: N/A;  . shoulder Bilateral     There were no vitals filed for this visit.  Subjective Assessment - 04/16/18 1512    Subjective  "Im ok migranes are killing me" "Iv been working the last 6 days and my L leg hurts"    Currently in Pain?  Yes    Pain Score  9     Pain Location  Leg    Pain Orientation  Left    Pain Descriptors / Indicators  Burning                        [facility] Adult PT Treatment/Exercise - 04/16/18 0001      Exercises   Exercises  Knee/Hip      Knee/Hip Exercises: Stretches   Passive Hamstring Stretch  Left;30 seconds;3 reps    Piriformis Stretch  Left;3 reps;30 seconds    Other Knee/Hip Stretches  LLE K2C  5x10''      Knee/Hip Exercises: Aerobic   Nustep  L3 x7 min       Knee/Hip Exercises: Machines for Strengthening   Cybex Knee Extension  15lb, 3x10    Cybex Knee Flexion  35 lbs 3x10      Knee/Hip Exercises: Seated   Clamshell with TheraBand  Red   2x10   Sit to Sand  2 sets;10 reps;without UE support   holding blue ball     Knee/Hip Exercises: Supine   Bridges  AROM;Strengthening;Both;1 set;15 reps   hold 5 sec     Knee/Hip Exercises: Prone   Other Prone Exercises  cat-cow 2x10      Modalities   Modalities  Electrical Stimulation;Moist Heat      Moist Heat Therapy   Number Minutes Moist Heat  15 Minutes    Moist Heat Location  Hip  Programme researcher, broadcasting/film/video Location  left anterior lateral hip/thigh    Electrical Stimulation Action  IFC    Electrical Stimulation Parameters  SIDELYING    Electrical Stimulation Goals  Pain      Manual Therapy   Manual therapy comments  STM with roller stick to anterior, lateral, posterior thigh, IT band, and glutes               PT Short Term Goals - 04/16/18 1604      PT SHORT TERM GOAL #1   Title  independent with initial HEP    Time  2    Period  Weeks    Status  Achieved        PT Long Term Goals - 03/20/18 1423      PT LONG TERM GOAL #1   Title  understand proper posture and body mechanics    Time  8    Period  Weeks    Status  New      PT LONG TERM GOAL #2   Title  decrease pain 25%    Time  8    Period  Weeks    Status  New      PT LONG TERM GOAL #3   Title  increase hip strength to 4+/5    Time  8    Period  Weeks    Status  New      PT LONG TERM GOAL #4   Title  increase SLR  to 70 degrees     Time  8    Period  Weeks    Status  New            Plan - 04/16/18 1600    Clinical Impression Statement  Pt tolerated progression of exercises well without adverse affects. Pt is somewhat reserved and has to be pushed. Pt presented with tight HS and periformis. Pt is making slow progress towards all goals but is limited due to pain.     Rehab Potential  Fair    PT Frequency  2x / week    PT Duration  8 weeks    PT Treatment/Interventions  ADLs/Self Care Home Management;Cryotherapy;Electrical Stimulation;Iontophoresis 4mg /ml Dexamethasone;Moist Heat;Ultrasound;Therapeutic exercise;Therapeutic activities;Functional mobility training;Patient/family education;Manual techniques    PT Next Visit Plan  progress exercise and stretching as pain allows    Consulted and Agree with Plan of Care  Patient       Patient will benefit from skilled therapeutic intervention in order to improve the following deficits and impairments:  Abnormal gait, Decreased range of motion, Difficulty walking, Obesity, Increased muscle spasms, Decreased activity tolerance, Pain, Impaired flexibility, Improper body mechanics, Decreased mobility, Decreased strength, Postural dysfunction  Visit Diagnosis: Difficulty in walking, not elsewhere classified  Pain in left hip     Problem List Patient Active Problem List   Diagnosis Date Noted  . 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 Atypical chest pain   . Dysuria 11/15/2017  . Tobacco use 11/15/2017  . (HFpEF) heart failure with preserved ejection fraction (HCC) 11/14/2017  . Obstructive sleep apnea 09/26/2017  . Hypothyroidism 04/17/2017  . Diabetes mellitus (HCC) 04/17/2017  . Hypertension associated with diabetes (HCC) 04/17/2017  . COPD (chronic obstructive pulmonary disease) (HCC) 04/17/2017  . GERD (gastroesophageal reflux disease) 04/17/2017  . 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 Fibromyalgia syndrome 04/17/2017    Duanne Moron,  SPTA 04/16/2018, 4:05 PM  [facility] [place] [phone] [address] [place], [place], [address] Phone: [phone]   Fax:  [phone]  Name: Karen [name] MRN: [id number] Date of Birth: [birth year]

## 2018-04-19 ENCOUNTER — Ambulatory Visit: Admitting: Physical Therapy

## 2018-04-19 ENCOUNTER — Encounter: Payer: Self-pay | Admitting: Physical Therapy

## 2018-04-19 DIAGNOSIS — R262 Difficulty in walking, not elsewhere classified: Secondary | ICD-10-CM

## 2018-04-19 DIAGNOSIS — M25552 Pain in left hip: Secondary | ICD-10-CM

## 2018-04-19 NOTE — Therapy (Signed)
[place] [address] [place], [place], [address] Phone: [phone]   Fax:  [phone]  Physical Therapy Treatment  Patient Details  Name: Karen [name] MRN: [id number] Date of Birth: 1976-04-29 Referring Provider (PT): Ledell Noss   Encounter Date: 04/19/2018  PT End of Session - 04/19/18 1556    Visit Number  6    Date for PT Re-Evaluation  05/20/18    PT Start Time  1527    PT Stop Time  1600    PT Time Calculation (min)  33 min    Activity Tolerance  Patient tolerated treatment well    Behavior During Therapy  [facility] for tasks assessed/performed       Past Medical History:  Diagnosis Date  . Atypical chest pain    a. Cath 11/2017 - R/LHC showing 30% mid LAD, otherwise no significant disease, EF 55-65%, normal LVEDP, normal heart pressures (no evidence of significant CAD or CHF).  . COPD (chronic obstructive pulmonary disease) ([place])   . Diabetes mellitus ([place]) 04/17/2017  . Fibromyalgia syndrome 04/17/2017  . GERD (gastroesophageal reflux disease) 04/17/2017  . Hypertension   . Hypothyroidism 04/17/2017  . Manic depression ([place]) 04/17/2017  . Obstructive sleep apnea 09/26/2017  . Recurrent syncope   . Tobacco use 11/15/2017    Past Surgical History:  Procedure Laterality Date  . btl    . CHOLECYSTECTOMY    . RIGHT/LEFT HEART CATH AND CORONARY ANGIOGRAPHY N/A 12/07/2017   Procedure: RIGHT/LEFT HEART CATH AND CORONARY ANGIOGRAPHY;  Surgeon: Leonie Man, MD;  Location: [place] CV LAB;  Service: Cardiovascular;  Laterality: N/A;  . shoulder Bilateral     There were no vitals filed for this visit.  Subjective Assessment - 04/19/18 1529    Subjective  "A little better, I went to class"    Currently in Pain?  No/denies    Pain Score  0-No pain                       [facility] Adult PT Treatment/Exercise - 04/19/18 0001      Exercises   Exercises  Knee/Hip      Knee/Hip Exercises:  Aerobic   Recumbent Bike  L1 x3 min     Nustep  L4 x6 min       Knee/Hip Exercises: Machines for Strengthening   Cybex Knee Extension  10lb 2x15    Cybex Knee Flexion  25lb 2x15    Total Gym Leg Press  40lb 2x10      Knee/Hip Exercises: Standing   Lateral Step Up  Both;1 set;10 reps;Hand Hold: 0;Step Height: 6"    Forward Step Up  Both;1 set;10 reps;Hand Hold: 0;Step Height: 6"      Knee/Hip Exercises: Seated   Ball Squeeze  2x10               PT Short Term Goals - 04/16/18 1604      PT SHORT TERM GOAL #1   Title  independent with initial HEP    Time  2    Period  Weeks    Status  Achieved        PT Long Term Goals - 04/19/18 1558      PT LONG TERM GOAL #1   Title  understand proper posture and body mechanics    Status  Achieved      PT LONG TERM GOAL #2   Title  decrease pain 25%  Status  Partially Met      PT LONG TERM GOAL #3   Title  increase hip strength to 4+/5    Status  On-going      PT LONG TERM GOAL #4   Title  increase SLR to 70 degrees     Status  On-going            Plan - 04/19/18 1556    Clinical Impression Statement  Pt ~ 12 minutes late for today's session. Focuses on LE strengthening both knees and hip in multiple planes. Fatigue reported with step up interventions. No issues with machine level interventions. Reports some LE soreness with curls and extensions.    Rehab Potential  Fair    PT Frequency  2x / week    PT Duration  8 weeks    PT Next Visit Plan  progress exercise and stretching as pain allows       Patient will benefit from skilled therapeutic intervention in order to improve the following deficits and impairments:  Abnormal gait, Decreased range of motion, Difficulty walking, Obesity, Increased muscle spasms, Decreased activity tolerance, Pain, Impaired flexibility, Improper body mechanics, Decreased mobility, Decreased strength, Postural dysfunction  Visit Diagnosis: Pain in left hip  Difficulty in walking,  not elsewhere classified     Problem List Patient Active Problem List   Diagnosis Date Noted  . 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 Atypical chest pain   . Dysuria 11/15/2017  . Tobacco use 11/15/2017  . (HFpEF) heart failure with preserved ejection fraction ([place]) 11/14/2017  . Obstructive sleep apnea 09/26/2017  . Hypothyroidism 04/17/2017  . Diabetes mellitus ([place]) 04/17/2017  . Hypertension associated with diabetes ([place]) 04/17/2017  . COPD (chronic obstructive pulmonary disease) ([place]) 04/17/2017  . GERD (gastroesophageal reflux disease) 04/17/2017  . Manic depression ([place]) 04/17/2017  . Fibromyalgia syndrome 04/17/2017    Scot Jun, PTA 04/19/2018, 3:59 PM  [place] [employer] [place], [place], [address] Phone: [phone]   Fax:  [phone]  Name: Karen [name] MRN: [id number] Date of Birth: 09-18-1975

## 2018-04-24 ENCOUNTER — Ambulatory Visit: Admitting: Physical Therapy

## 2018-04-30 ENCOUNTER — Ambulatory Visit: Attending: Psychiatry | Admitting: Physical Therapy

## 2018-05-02 ENCOUNTER — Ambulatory Visit: Admitting: Physical Therapy

## 2018-05-03 ENCOUNTER — Encounter: Admitting: Internal Medicine

## 2018-05-10 ENCOUNTER — Encounter: Admitting: Internal Medicine

## 2018-05-15 ENCOUNTER — Other Ambulatory Visit: Payer: Self-pay

## 2018-05-15 ENCOUNTER — Ambulatory Visit: Admitting: Licensed Clinical Social Worker

## 2018-05-15 ENCOUNTER — Encounter: Payer: Self-pay | Admitting: Internal Medicine

## 2018-05-15 ENCOUNTER — Ambulatory Visit (INDEPENDENT_AMBULATORY_CARE_PROVIDER_SITE_OTHER): Admitting: Internal Medicine

## 2018-05-15 VITALS — BP 139/70 | HR 87 | Temp 98.1°F | Ht 67.0 in | Wt 261.6 lb

## 2018-05-15 DIAGNOSIS — E1159 Type 2 diabetes mellitus with other circulatory complications: Secondary | ICD-10-CM

## 2018-05-15 DIAGNOSIS — E1142 Type 2 diabetes mellitus with diabetic polyneuropathy: Secondary | ICD-10-CM | POA: Diagnosis not present

## 2018-05-15 DIAGNOSIS — I5030 Unspecified diastolic (congestive) heart failure: Secondary | ICD-10-CM

## 2018-05-15 DIAGNOSIS — I152 Hypertension secondary to endocrine disorders: Secondary | ICD-10-CM

## 2018-05-15 DIAGNOSIS — F310 Bipolar disorder, current episode hypomanic: Secondary | ICD-10-CM

## 2018-05-15 DIAGNOSIS — Z794 Long term (current) use of insulin: Secondary | ICD-10-CM

## 2018-05-15 DIAGNOSIS — R0789 Other chest pain: Secondary | ICD-10-CM

## 2018-05-15 DIAGNOSIS — I110 Hypertensive heart disease with heart failure: Secondary | ICD-10-CM

## 2018-05-15 DIAGNOSIS — F419 Anxiety disorder, unspecified: Secondary | ICD-10-CM

## 2018-05-15 DIAGNOSIS — I10 Essential (primary) hypertension: Secondary | ICD-10-CM

## 2018-05-15 DIAGNOSIS — G5710 Meralgia paresthetica, unspecified lower limb: Secondary | ICD-10-CM

## 2018-05-15 DIAGNOSIS — Z79899 Other long term (current) drug therapy: Secondary | ICD-10-CM

## 2018-05-15 DIAGNOSIS — F3132 Bipolar disorder, current episode depressed, moderate: Secondary | ICD-10-CM

## 2018-05-15 DIAGNOSIS — Z6841 Body Mass Index (BMI) 40.0 and over, adult: Secondary | ICD-10-CM

## 2018-05-15 DIAGNOSIS — K219 Gastro-esophageal reflux disease without esophagitis: Secondary | ICD-10-CM

## 2018-05-15 DIAGNOSIS — E039 Hypothyroidism, unspecified: Secondary | ICD-10-CM

## 2018-05-15 DIAGNOSIS — G4733 Obstructive sleep apnea (adult) (pediatric): Secondary | ICD-10-CM

## 2018-05-15 DIAGNOSIS — F319 Bipolar disorder, unspecified: Secondary | ICD-10-CM

## 2018-05-15 DIAGNOSIS — J449 Chronic obstructive pulmonary disease, unspecified: Secondary | ICD-10-CM

## 2018-05-15 LAB — POCT GLYCOSYLATED HEMOGLOBIN (HGB A1C): HEMOGLOBIN A1C: 7.6 % — AB (ref 4.0–5.6)

## 2018-05-15 LAB — GLUCOSE, CAPILLARY: Glucose-Capillary: 217 mg/dL — ABNORMAL HIGH (ref 70–99)

## 2018-05-15 MED ORDER — EXENATIDE 10 MCG/0.04ML ~~LOC~~ SOPN: 10.0000 ug | PEN_INJECTOR | Freq: Two times a day (BID) | SUBCUTANEOUS | 6 refills | Status: DC

## 2018-05-15 NOTE — Patient Instructions (Signed)
Thank you for coming to the clinic today. It was a pleasure to see you.   Increase the byetta to 10 mg twice daily  Keep up the great work with your weight loss! Try low weight bearing exercises like using a pool  Switch from seasoning salt to a non salt containing food seasoning   FOLLOW-UP INSTRUCTIONS When: 3 months with Dr. Obie DredgeBlum  For: follow up of the diabetes  What to bring: all of your medication bottles   Please call the internal medicine center clinic if you have any questions or concerns, we may be able to help and keep you from a long and expensive emergency room wait. Our clinic and after hours phone number is [phone], the best time to call is Monday through Friday 9 am to 4 pm but there is always someone available 24/7 if you have an emergency. If you need medication refills please notify your pharmacy one week in advance and they will send us a request.

## 2018-05-15 NOTE — Progress Notes (Signed)
CC: follow up of hypertension   HPI:  [name] is a 42 y.o. COPD, GERD, uncontrolled DM, HTN, hypothyroid,manicdepression, andOSA (not on CPAP), HFpEF ( echo 09/2017 with EF 60-65%, grade 2 diastolic dysfunction), chronic non cardiac atypical chest pain, meralgia paresthetica who presents for follow up of HTN and diabetes. Please see the assessment and plans for the status of the patient chronic medical problems.  Past Medical History:  Diagnosis Date  . Atypical chest pain    a. Cath 11/2017 - R/LHC showing 30% mid LAD, otherwise no significant disease, EF 55-65%, normal LVEDP, normal heart pressures (no evidence of significant CAD or CHF).  . COPD (chronic obstructive pulmonary disease) (HCC)   . Diabetes mellitus (HCC) 04/17/2017  . Fibromyalgia syndrome 04/17/2017  . GERD (gastroesophageal reflux disease) 04/17/2017  . Hypertension   . Hypothyroidism 04/17/2017  . 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 Obstructive sleep apnea 09/26/2017  . Recurrent syncope   . Tobacco use 11/15/2017   Review of Systems:  Refer to history of present illness and assessment and plans for pertinent review of systems, all others reviewed and negative  Physical Exam:  Vitals:   05/15/18 1346  BP: 139/70  Pulse: 87  Temp: 98.1 F (36.7 C)  TempSrc: Oral  SpO2: 99%  Weight: 261 lb 9.6 oz (118.7 kg)  Height: 5\' 7"  (1.702 m)   General: anxious appearing, tearful  Cardiac: regular rate and rhythm, no murmurs, trace peripheral edema  Pulm: lungs are clear to auscultation, no wheezes or rhonchi  GI: abdomen is soft, non tender, non distended   Assessment & Plan:   Uncontrolled diabetes mellitus - Hemoglobin A1c was 7.6 when last checked 3 months ago, Byetta 5 mg twice daily was started at that time and she has lost 11 pounds since the time of last office visit. She has no complication of the byetta   - hemoglobin A1c is stable at 7.6 today, no meter today and she hasnt been checking  it - increase byetta to 10 mg BID  -Empagliflozin 10 mg daily, NovoLog 10 units 3 times daily, Levemir 40 units daily -continue moderate intensity lovastatin 40 mg daily   Hypertension  BP Readings from Last 3 Encounters:  05/15/18 139/70  03/07/18 (!) 142/84  02/20/18 124/74  Blood pressure is not at goal today but has been consistently controlled on recent checks this year.  Last BMP was within the last year 6 months and showed normal renal function and electrolytes. She has known OSA for which she declines using CPAP consistently. She does add salt to food while cooking, she also eats out on occasion.  She does not use ibuprofen or alleve.  - continue Lisinopril 40 mg daily, metoprolol tartrate 6.25 mg twice daily, amlodipine 10 mg daily - encouraged switch from seasoning salt to a non salt containing seasoner   Morbid obesity  BMI 40 with comorbidity including uncontrolled diabetes and hypertension. She describes generalized pain limiting her ability to exercise after a day of work on her feet. She is considering pursuing a gym membership so that she can do aquatic exercises.    Anxiety Bipolar affective disorder  Patient is experiencing significant stress in her life related to disagreements with her husband and her generalized pain. She feels lack of motivation, down, and depressed. She sees psychiatry at [facility] and tried CBT there but was not happy with it. She is interested in evaluation by our in office behavioral health clinician Lysle Rubens today.  -  referral placed to integrated behavioral health for evaluation of utility of CBT  - continue with psychiatry for management of bipolar disorder   Preventative health  - due for pap smear today, she would like have this done at a future visit   See Encounters Tab for problem based charting.  Patient discussed with Dr. Josem KaufmannKlima

## 2018-05-16 ENCOUNTER — Encounter: Payer: Self-pay | Admitting: Internal Medicine

## 2018-05-16 ENCOUNTER — Encounter: Payer: Self-pay | Admitting: Licensed Clinical Social Worker

## 2018-05-16 NOTE — Assessment & Plan Note (Signed)
Patient is experiencing significant stress in her life related to disagreements with her husband and her generalized pain. She feels lack of motivation, down, and depressed. She sees psychiatry at [facility] and tried CBT there but was not happy with it. She is interested in evaluation by our in office behavioral health clinician Karen [name] today.  - referral placed to integrated behavioral health for evaluation of utility of CBT  - continue with psychiatry for management of bipolar disorder

## 2018-05-16 NOTE — BH Specialist Note (Signed)
Integrated Behavioral Health Initial Visit  MRN: [id number] Name: Karen [name]  Number of Integrated Behavioral Health Clinician visits:: 1/6 Session Start time: 2:18  Session End time: 2:51 Total time: 33 minutes  Type of Service: Integrated Behavioral Health- Individual/Family Interpretor:No.    Warm Hand Off Completed. Yes, by Dr. Obie DredgeBlum.       SUBJECTIVE: Karen [name] is a 42 y.o. female  whom attended the session individually.  Patient was referred by Dr. Obie DredgeBlum for CBT to assist with depression and chronic pain. Patient reports the following symptoms/concerns: history of high and low mood symptoms, physical challenges with working/standing on her feet, family dynamic concerns, currently depressed mood symptoms (lack of interest), and history of panic attacks and anxiety per patient.  Duration of problem: Patient reported having difficulty adjusting to pain for over 10 years; Severity of problem: moderate  OBJECTIVE: Mood: Depressed and Hopeless and Affect: Depressed and Tearful Risk of harm to self or others: No plan to harm self or others  LIFE CONTEXT: Family and Social: Patient lives with her husband whom is physically disabled. Patient reported she is currently serving as his caregiver, and has all of the household responsibilities on herself. Patient reported her husband and herself argue verbally frequently, and has very little family support. [place], and is adjusting to living in [place].  School/Work: Patient is currently working at a hotel in the service field, and reported her job requires her to stand on her feet the entire shift. Patient is currently working with [facility] to identify other job opportunities to reduce her standing on her feet. 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  Self-Care: Patient reported having an interest in crafts and sewing, however, at this time  has no interest in engaging in tasks that once brought her enjoyment. Patient reported a decrease in unhealthy activities since being consistent on her medication for the past three months.  Life Changes: Ongoing pain in her feet. Patient reported frequent arguing with her spouse. [place], and is adjusting to her new environment.   GOALS ADDRESSED: Patient will: 1. Reduce symptoms of: anxiety, depression, mood instability and stress 2. Increase knowledge and/or ability of: coping skills, healthy habits, stress reduction and improve communication skills with others.   3. Demonstrate ability to: Increase healthy adjustment to current life circumstances, Increase adequate support systems for patient/family and ability to challenge negative thought patterns.   INTERVENTIONS: Interventions utilized: Brief CBT and Supportive Counseling  Standardized Assessments completed: PHQ 9 (Score was a 20) and mood disorder Questionnaire (elevated history of manic symptoms, currently depressed).   ASSESSMENT: Patient currently experiencing depressive mood symptoms, especially lack of interest in pleasurable activities. Patient has daily chronic pain, and reported difficulty coping with the chronic pain. Patient has a history of manic/hypomanic behaviors, based on her self-report (gambling, and driving intoxicated). Patient is reporting no manic behaviors in the last three months since beginning medications from her psychiatrist [facility]. Patient reported relationship difficulties with her spouse, and feels overwhelmed by all of her responsibilities. Patient reported no SI, HI, or thoughts of self-harm.    Patient may benefit from weekly to bi-weekly outpatient therapy. Marland Kitchen.  PLAN: 1. Follow up with behavioral health clinician on : one to two weeks.   2. Referral(s): [facility]  [place], [place], [place]

## 2018-05-16 NOTE — Assessment & Plan Note (Signed)
-   Hemoglobin A1c was 7.6 when last checked 3 months ago, Byetta 5 mg twice daily was started at that time and she has lost 11 pounds since the time of last office visit. She has no complication of the byetta   - hemoglobin A1c is stable at 7.6 today, no meter today and she hasnt been checking it - increase byetta to 10 mg BID  -Empagliflozin 10 mg daily, NovoLog 10 units 3 times daily, Levemir 40 units daily -continue moderate intensity lovastatin 40 mg daily

## 2018-05-16 NOTE — Assessment & Plan Note (Addendum)
BP Readings from Last 3 Encounters:  05/15/18 139/70  03/07/18 (!) 142/84  02/20/18 124/74  Blood pressure is not at goal today but has been consistently controlled on recent checks this year.  Last BMP was within the last year 6 months and showed normal renal function and electrolytes. She has known OSA for which she declines using CPAP consistently. She does add salt to food while cooking, she also eats out on occasion.  She does not use ibuprofen or alleve.  - continue Lisinopril 40 mg daily, metoprolol tartrate 6.25 mg twice daily, amlodipine 10 mg daily - encouraged switch from seasoning salt to a non salt containing seasoner

## 2018-05-16 NOTE — Assessment & Plan Note (Signed)
BMI 40 with comorbidity including uncontrolled diabetes and hypertension. She describes generalized pain limiting her ability to exercise after a day of work on her feet. She is considering pursuing a gym membership so that she can do aquatic exercises.

## 2018-05-17 ENCOUNTER — Encounter: Payer: Self-pay | Admitting: *Deleted

## 2018-05-17 NOTE — Progress Notes (Signed)
Case discussed with Dr. Blum at the time of the visit. We reviewed the resident's history and exam and pertinent patient test results. I agree with the assessment, diagnosis, and plan of care documented in the resident's note. 

## 2018-05-28 ENCOUNTER — Ambulatory Visit: Admitting: Licensed Clinical Social Worker

## 2018-05-28 DIAGNOSIS — F3132 Bipolar disorder, current episode depressed, moderate: Secondary | ICD-10-CM

## 2018-05-28 NOTE — BH Specialist Note (Signed)
Integrated Behavioral Health Follow Up Visit  MRN: [id number] Name: Karen [name]  Number of Integrated Behavioral Health Clinician visits: 2/6 Session Start time: 2:32  Session End time: 3:30 Total time: 58 minutes  Type of Service: Integrated Behavioral Health- Individual/Family Interpretor:No.   SUBJECTIVE: Karen [name] is a 42 y.o. female  whom attended the session individually.  Patient was referred by Dr. Obie DredgeBlum for Bipolar related symptoms. Patient reports the following symptoms/concerns: Issues related to her marriage, depression, physical pain, anger, and unhappiness related to her job.  Duration of problem: over 10 years; Severity of problem: moderate  OBJECTIVE: Mood: Depressed, Hopeless and Irritable and Affect: Depressed Risk of harm to self or others: No plan to harm self or others  LIFE CONTEXT: Family and Social: Patient lives with her spouse. Patient feels that she has limited supports, and is currently in an unhealthy marriage. Patient reported that she wants to improve her ability to communicate with others, and avoids conflict.  School/Work: Patient is currently working at a job where she is required to stand on her feet for the majority of her shift. Patient has an upcoming appointment at [facility], and is hoping to begin looking for employment options to meet her physical requirements. Patient is continuing to go to school, but reported that she recently failed one of her classes. 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  Self-Care: Needs improvement. Patient recently visited her family.  Life Changes: No reported.   GOALS ADDRESSED: Patient will: 1.  Reduce symptoms of: agitation, depression and mood instability  2.  Increase knowledge and/or ability of: coping skills, healthy habits and stress reduction  3.  Demonstrate ability to: Increase healthy adjustment to current life circumstances, Increase adequate  support systems for patient/family and communicate her thoughts and feelings in a healthy way.   INTERVENTIONS: Interventions utilized:  Solution-Focused Strategies, Brief CBT and Supportive Counseling. Therapist assessed for SI, HI, and self-harm. Therapist assessed for frequency of hypomanic/manic behaviors. CBT was used to explore the patient's frequent responses to anger. CBT was used to challenge the unhealthy coping skills related to extreme raging or avoidance.    Standardized Assessments completed: assessed for SI, HI, and self-harm.   ASSESSMENT: Patient currently experiencing mood instability. Patient's mood appears to be depressed currently. Patient denies any thoughts of hurting herself or others. Patient feels unsupported in her marriage, but is not communicating her needs. Patient wants to improve her ability to communicate with others, and learn how to set healthy boundaries. Patient recently gave her money to a family member, when she needed the money for herself. Patient is unsure why she engages in these co-dependent behaviors, but recognizes they make her unhappy. Patient is compliant with her medication currently, and feels the regimen is working to reduce bipolar related symptoms.  Patient is currently unhappy with her employment, and is working to find a solution.   Patient may benefit from weekly outpatient therapy, and continue regular appointments with her psychiatrist.  PLAN: 1. Follow up with behavioral health clinician on : one week.   Lysle RubensAshton Isa Kohlenberg, LPC, LCAS

## 2018-05-31 ENCOUNTER — Encounter: Payer: Self-pay | Admitting: Licensed Clinical Social Worker

## 2018-06-05 ENCOUNTER — Other Ambulatory Visit: Payer: Self-pay | Admitting: Neurology

## 2018-06-05 ENCOUNTER — Other Ambulatory Visit: Payer: Self-pay

## 2018-06-05 MED ORDER — METHYLPHENIDATE HCL 20 MG PO TABS: 20.0000 mg | ORAL_TABLET | Freq: Two times a day (BID) | ORAL | 0 refills | Status: DC

## 2018-06-05 NOTE — Telephone Encounter (Signed)
Patient would like a refill on rx Ritalin. Please call and advise.

## 2018-06-05 NOTE — Telephone Encounter (Signed)
Pt is due for a refill on ritalin and is up to date on her appts. [place] [facility] checked.  I called pt. She [facility]

## 2018-06-05 NOTE — Telephone Encounter (Signed)
Requesting all meds to be filled @  [facility] - [place], [place] - [phone] (Phone) [phone] (Fax)

## 2018-06-07 ENCOUNTER — Ambulatory Visit: Admitting: Licensed Clinical Social Worker

## 2018-06-12 ENCOUNTER — Encounter: Payer: Self-pay | Admitting: Internal Medicine

## 2018-06-20 NOTE — Telephone Encounter (Signed)
Changed on profile

## 2018-07-02 ENCOUNTER — Encounter: Payer: Self-pay | Admitting: *Deleted

## 2018-07-10 ENCOUNTER — Telehealth: Payer: Self-pay | Admitting: Internal Medicine

## 2018-07-10 ENCOUNTER — Other Ambulatory Visit: Payer: Self-pay | Admitting: *Deleted

## 2018-07-10 DIAGNOSIS — K219 Gastro-esophageal reflux disease without esophagitis: Secondary | ICD-10-CM

## 2018-07-10 DIAGNOSIS — E039 Hypothyroidism, unspecified: Secondary | ICD-10-CM

## 2018-07-10 DIAGNOSIS — I10 Essential (primary) hypertension: Secondary | ICD-10-CM

## 2018-07-10 DIAGNOSIS — Z794 Long term (current) use of insulin: Secondary | ICD-10-CM

## 2018-07-10 DIAGNOSIS — E118 Type 2 diabetes mellitus with unspecified complications: Secondary | ICD-10-CM

## 2018-07-10 DIAGNOSIS — J449 Chronic obstructive pulmonary disease, unspecified: Secondary | ICD-10-CM

## 2018-07-13 MED ORDER — INSULIN ASPART 100 UNIT/ML FLEXPEN: 10.0000 [IU] | PEN_INJECTOR | Freq: Three times a day (TID) | SUBCUTANEOUS | 3 refills | Status: DC

## 2018-07-13 MED ORDER — LEVOTHYROXINE SODIUM 150 MCG PO TABS: 150.0000 ug | ORAL_TABLET | Freq: Every day | ORAL | 3 refills | Status: DC

## 2018-07-13 MED ORDER — ALBUTEROL SULFATE HFA 108 (90 BASE) MCG/ACT IN AERS: 2.0000 | INHALATION_SPRAY | RESPIRATORY_TRACT | 3 refills | Status: DC | PRN

## 2018-07-13 MED ORDER — LISINOPRIL 40 MG PO TABS: 40.0000 mg | ORAL_TABLET | Freq: Every day | ORAL | 3 refills | Status: DC

## 2018-07-13 MED ORDER — GABAPENTIN 300 MG PO CAPS: 900.0000 mg | ORAL_CAPSULE | Freq: Two times a day (BID) | ORAL | 3 refills | Status: DC

## 2018-07-13 MED ORDER — METFORMIN HCL 500 MG PO TABS: 1000.0000 mg | ORAL_TABLET | Freq: Two times a day (BID) | ORAL | 3 refills | Status: DC

## 2018-07-13 MED ORDER — EXENATIDE 10 MCG/0.04ML ~~LOC~~ SOPN: 10.0000 ug | PEN_INJECTOR | Freq: Two times a day (BID) | SUBCUTANEOUS | 3 refills | Status: DC

## 2018-07-13 MED ORDER — EMPAGLIFLOZIN 10 MG PO TABS: 10.0000 mg | ORAL_TABLET | Freq: Every day | ORAL | 3 refills | Status: DC

## 2018-07-13 MED ORDER — GLUCOSE BLOOD VI STRP: ORAL_STRIP | 3 refills | Status: DC

## 2018-07-13 MED ORDER — FREESTYLE LANCETS MISC: 3 refills | Status: DC

## 2018-07-13 MED ORDER — LOVASTATIN 40 MG PO TABS: 40.0000 mg | ORAL_TABLET | Freq: Every day | ORAL | 3 refills | Status: DC

## 2018-07-13 MED ORDER — AMLODIPINE BESYLATE 10 MG PO TABS: 10.0000 mg | ORAL_TABLET | Freq: Every day | ORAL | 3 refills | Status: DC

## 2018-07-13 MED ORDER — INSULIN PEN NEEDLE 32G X 4 MM MISC: 1.0000 | Freq: Four times a day (QID) | 3 refills | Status: DC

## 2018-07-13 MED ORDER — MONTELUKAST SODIUM 10 MG PO TABS: 10.0000 mg | ORAL_TABLET | Freq: Every day | ORAL | 3 refills | Status: DC

## 2018-07-13 MED ORDER — INSULIN DETEMIR 100 UNIT/ML FLEXPEN: 40.0000 [IU] | PEN_INJECTOR | Freq: Every day | SUBCUTANEOUS | 3 refills | Status: DC

## 2018-07-13 MED ORDER — METOCLOPRAMIDE HCL 5 MG PO TABS: 5.0000 mg | ORAL_TABLET | Freq: Every day | ORAL | 3 refills | Status: DC

## 2018-07-13 MED ORDER — OMEPRAZOLE 40 MG PO CPDR: 40.0000 mg | DELAYED_RELEASE_CAPSULE | Freq: Two times a day (BID) | ORAL | 3 refills | Status: DC

## 2018-08-02 ENCOUNTER — Other Ambulatory Visit: Payer: Self-pay

## 2018-08-02 ENCOUNTER — Emergency Department (HOSPITAL_COMMUNITY)
Admission: EM | Admit: 2018-08-02 | Discharge: 2018-08-02 | Disposition: A | Discharge status: Home / Self Care | Attending: Emergency Medicine | Admitting: Emergency Medicine

## 2018-08-02 ENCOUNTER — Other Ambulatory Visit: Payer: Self-pay | Admitting: Neurology

## 2018-08-02 ENCOUNTER — Encounter (HOSPITAL_COMMUNITY): Payer: Self-pay | Admitting: Emergency Medicine

## 2018-08-02 ENCOUNTER — Emergency Department (HOSPITAL_COMMUNITY)

## 2018-08-02 DIAGNOSIS — E119 Type 2 diabetes mellitus without complications: Secondary | ICD-10-CM | POA: Diagnosis not present

## 2018-08-02 DIAGNOSIS — Z87891 Personal history of nicotine dependence: Secondary | ICD-10-CM | POA: Diagnosis not present

## 2018-08-02 DIAGNOSIS — Z794 Long term (current) use of insulin: Secondary | ICD-10-CM | POA: Insufficient documentation

## 2018-08-02 DIAGNOSIS — R002 Palpitations: Secondary | ICD-10-CM | POA: Insufficient documentation

## 2018-08-02 DIAGNOSIS — R079 Chest pain, unspecified: Secondary | ICD-10-CM | POA: Insufficient documentation

## 2018-08-02 DIAGNOSIS — I10 Essential (primary) hypertension: Secondary | ICD-10-CM | POA: Insufficient documentation

## 2018-08-02 DIAGNOSIS — E039 Hypothyroidism, unspecified: Secondary | ICD-10-CM | POA: Diagnosis not present

## 2018-08-02 DIAGNOSIS — D649 Anemia, unspecified: Secondary | ICD-10-CM | POA: Diagnosis not present

## 2018-08-02 DIAGNOSIS — J449 Chronic obstructive pulmonary disease, unspecified: Secondary | ICD-10-CM | POA: Diagnosis not present

## 2018-08-02 DIAGNOSIS — Z79899 Other long term (current) drug therapy: Secondary | ICD-10-CM | POA: Insufficient documentation

## 2018-08-02 LAB — I-STAT TROPONIN, ED: Troponin i, poc: 0 ng/mL (ref 0.00–0.08)

## 2018-08-02 LAB — BASIC METABOLIC PANEL
Anion gap: 11 (ref 5–15)
BUN: 8 mg/dL (ref 6–20)
CHLORIDE: 104 mmol/L (ref 98–111)
CO2: 21 mmol/L — ABNORMAL LOW (ref 22–32)
Calcium: 8.9 mg/dL (ref 8.9–10.3)
Creatinine, Ser: 0.75 mg/dL (ref 0.44–1.00)
GFR calc Af Amer: >60 mL/min (ref >60)
GFR calc non Af Amer: >60 mL/min (ref >60)
Glucose, Bld: 120 mg/dL — ABNORMAL HIGH (ref 70–99)
Potassium: 3.5 mmol/L (ref 3.5–5.1)
Sodium: 136 mmol/L (ref 135–145)

## 2018-08-02 LAB — I-STAT BETA HCG BLOOD, ED (MC, WL, AP ONLY)

## 2018-08-02 LAB — CBC
HEMATOCRIT: 37.3 % (ref 36.0–46.0)
Hemoglobin: 10.8 g/dL — ABNORMAL LOW (ref 12.0–15.0)
MCH: 22.9 pg — ABNORMAL LOW (ref 26.0–34.0)
MCHC: 29 g/dL — ABNORMAL LOW (ref 30.0–36.0)
MCV: 79 fL — ABNORMAL LOW (ref 80.0–100.0)
NRBC: 0 % (ref 0.0–0.2)
Platelets: 268 10*3/uL (ref 150–400)
RBC: 4.72 MIL/uL (ref 3.87–5.11)
RDW: 15.8 % — ABNORMAL HIGH (ref 11.5–15.5)
WBC: 8.7 10*3/uL (ref 4.0–10.5)

## 2018-08-02 MED ORDER — METHYLPHENIDATE HCL 20 MG PO TABS: 20.0000 mg | ORAL_TABLET | Freq: Two times a day (BID) | ORAL | 0 refills | Status: DC

## 2018-08-02 MED ORDER — ALUM & MAG HYDROXIDE-SIMETH 200-200-20 MG/5ML PO SUSP
30.0000 mL | Freq: Once | ORAL | Status: AC
  Administered 2018-08-02: 30 mL via ORAL
  Filled 2018-08-02: qty 30

## 2018-08-02 MED ORDER — LIDOCAINE VISCOUS HCL 2 % MT SOLN
15.0000 mL | Freq: Once | OROMUCOSAL | Status: AC
  Administered 2018-08-02: 15 mL via ORAL
  Filled 2018-08-02: qty 15

## 2018-08-02 MED ORDER — IBUPROFEN 400 MG PO TABS
400.0000 mg | ORAL_TABLET | Freq: Once | ORAL | Status: AC
  Administered 2018-08-02: 400 mg via ORAL
  Filled 2018-08-02: qty 1

## 2018-08-02 MED ORDER — SODIUM CHLORIDE 0.9% FLUSH: 3.0000 mL | Freq: Once | INTRAVENOUS | Status: DC

## 2018-08-02 NOTE — Discharge Instructions (Signed)
Apply ice or heat as needed.  Take ibuprofen as needed for pain.  Return if symptoms are getting worse.  Talk with your heart doctor about possible Holter or Event Monitor.

## 2018-08-02 NOTE — ED Provider Notes (Signed)
[facility] EMERGENCY DEPARTMENT Provider Note   CSN: [id number] Arrival date & time: 08/02/18  0245    History   Chief Complaint Chief Complaint  Patient presents with  . Chest Pain    HPI Karen [name] is a 43 y.o. female.   The history is provided by the patient.  Chest Pain  She has history of hypertension, diabetes, COPD, bipolar disorder and comes in because of chest pain.  Chest pain is left-sided and woke her up from sleep at 1 AM.  She describes a heavy feeling which she rates at 8/10.  Nothing makes it better, nothing makes it worse.  There is associated dyspnea without nausea or diaphoresis.  She is also complaining of some numbness in her left arm and into her left jaw.  She also relates that she has been having flutters in her chest for the last week.  She has had similar pain in the past but does not recall the diagnosis.  She is a former smoker having quit 10 months ago.  There is no family history of coronary artery disease in first-degree relatives.  Past Medical History:  Diagnosis Date  . Atypical chest pain    a. Cath 11/2017 - R/LHC showing 30% mid LAD, otherwise no significant disease, EF 55-65%, normal LVEDP, normal heart pressures (no evidence of significant CAD or CHF).  . COPD (chronic obstructive pulmonary disease) (HCC)   . Diabetes mellitus (HCC) 04/17/2017  . Fibromyalgia syndrome 04/17/2017  . GERD (gastroesophageal reflux disease) 04/17/2017  . Hypertension   . Hypothyroidism 04/17/2017  . 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 Obstructive sleep apnea 09/26/2017  . Recurrent syncope   . Tobacco use 11/15/2017    Patient Active Problem List   Diagnosis Date Noted  . Morbid obesity (HCC) 05/15/2018  . 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 Atypical chest pain   . Dysuria 11/15/2017  . Tobacco use 11/15/2017  . (HFpEF) heart failure with preserved ejection fraction (HCC) 11/14/2017  . Obstructive sleep apnea 09/26/2017  .  Hypothyroidism 04/17/2017  . Diabetes mellitus (HCC) 04/17/2017  . Hypertension associated with diabetes (HCC) 04/17/2017  . COPD (chronic obstructive pulmonary disease) (HCC) 04/17/2017  . GERD (gastroesophageal reflux disease) 04/17/2017  . 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 Fibromyalgia syndrome 04/17/2017    Past Surgical History:  Procedure Laterality Date  . btl    . CHOLECYSTECTOMY    . RIGHT/LEFT HEART CATH AND CORONARY ANGIOGRAPHY N/A 12/07/2017   Procedure: RIGHT/LEFT HEART CATH AND CORONARY ANGIOGRAPHY;  Surgeon: Marykay Lex, MD;  Location: [facility] INVASIVE CV LAB;  Service: Cardiovascular;  Laterality: N/A;  . shoulder Bilateral      OB History   No obstetric history on file.      Home Medications    Prior to Admission medications   Medication Sig Start Date End Date Taking? Authorizing Provider  albuterol (PROVENTIL HFA;VENTOLIN HFA) 108 (90 Base) MCG/ACT inhaler Inhale 2 puffs into the lungs every 4 (four) hours as needed for wheezing or shortness of breath. 07/13/18   Eulah Pont, MD  amLODipine (NORVASC) 10 MG tablet Take 1 tablet (10 mg total) by mouth daily. 07/13/18   Eulah Pont, MD  ARIPiprazole (ABILIFY) 10 MG tablet Take 10 mg by mouth at bedtime.    [provider]  Blood Glucose Monitoring Suppl (FREESTYLE LITE) DEVI Use to check blood sugar up to 3 times a day 09/26/17   Eulah Pont, MD  divalproex (DEPAKOTE) 250 MG DR tablet Take  250 mg by mouth 2 (two) times daily.     [provider]  empagliflozin (JARDIANCE) 10 MG TABS tablet Take 10 mg by mouth daily. 07/13/18   Eulah Pont, MD  exenatide (BYETTA 10 MCG PEN) 10 MCG/0.04ML SOPN injection Inject 0.04 mLs (10 mcg total) into the skin 2 (two) times daily with a meal. 07/13/18   Eulah Pont, MD  gabapentin (NEURONTIN) 300 MG capsule Take 3 capsules (900 mg total) by mouth 2 (two) times daily. 07/13/18   Eulah Pont, MD  glucose blood (FREESTYLE LITE) test strip Use to check blood sugar 3 times  daily. diag code E11.9. insulin dependent 07/13/18   Eulah Pont, MD  insulin aspart (NOVOLOG FLEXPEN) 100 UNIT/ML FlexPen Inject 10 Units into the skin 3 (three) times daily with meals. 07/13/18   Eulah Pont, MD  Insulin Detemir (LEVEMIR FLEXPEN) 100 UNIT/ML Pen Inject 40 Units into the skin daily at 10 pm. IM program 07/13/18   Eulah Pont, MD  Insulin Pen Needle (NOVOFINE PLUS) 32G X 4 MM MISC 1 Dose by Does not apply route 4 (four) times daily. Use with Levemir and Novolog, IM program 07/13/18   Eulah Pont, MD  Lancets (FREESTYLE) lancets Use to check blood sugar 3 times daily. diag code E11.9. insulin dependent 07/13/18   Eulah Pont, MD  levothyroxine (SYNTHROID, LEVOTHROID) 150 MCG tablet Take 1 tablet (150 mcg total) by mouth daily before breakfast. 07/13/18   Eulah Pont, MD  lisinopril (PRINIVIL,ZESTRIL) 40 MG tablet Take 1 tablet (40 mg total) by mouth daily. 07/13/18   Eulah Pont, MD  lovastatin (MEVACOR) 40 MG tablet Take 1 tablet (40 mg total) by mouth at bedtime. 07/13/18   Eulah Pont, MD  metFORMIN (GLUCOPHAGE) 500 MG tablet Take 2 tablets (1,000 mg total) by mouth 2 (two) times daily with a meal. 07/13/18   Eulah Pont, MD  methylphenidate (RITALIN) 20 MG tablet Take 1 tablet (20 mg total) by mouth 2 (two) times daily with breakfast and lunch. Take in the morning and at lunch, no later than 3 PM. 06/05/18   Huston Foley, MD  metoCLOPramide (REGLAN) 5 MG tablet Take 1 tablet (5 mg total) by mouth at bedtime. 07/13/18   Eulah Pont, MD  metoprolol tartrate (LOPRESSOR) 25 MG tablet Take 6.5 mg by mouth 2 (two) times daily.    [provider]  montelukast (SINGULAIR) 10 MG tablet Take 1 tablet (10 mg total) by mouth at bedtime. 07/13/18   Eulah Pont, MD  omeprazole (PRILOSEC) 40 MG capsule Take 1 capsule (40 mg total) by mouth 2 (two) times daily at 8 am and 10 pm. 07/13/18   Eulah Pont, MD  prazosin (MINIPRESS) 1 MG capsule Take 1 mg by mouth at bedtime.    [provider]  sucralfate  (CARAFATE) 1 g tablet Take 1 tablet (1 g total) by mouth 4 (four) times daily -  with meals and at bedtime for 7 days. 08/02/17 12/07/17  Aviva Kluver B, PA-C  vitamin E 400 UNIT capsule Take 1 capsule (400 Units total) by mouth daily. 03/15/17   Gerhard Munch, MD    Family History Family History  Problem Relation Age of Onset  . Hypertension Mother   . Syncope episode Mother   . Heart disease Maternal Grandmother        unclear details    Social History Social History   Tobacco Use  . Smoking status: Former Smoker    Packs/day: 0.30    Types: Cigarettes  Last attempt to quit: 10/29/2017    Years since quitting: 0.7  . Smokeless tobacco: Never Used  . Tobacco comment: 1/3 per day   Substance Use Topics  . Alcohol use: Not Currently  . Drug use: No     Allergies   Aspirin and Tylenol [acetaminophen]   Review of Systems Review of Systems  Cardiovascular: Positive for chest pain.  All other systems reviewed and are negative.    Physical Exam Updated Vital Signs BP 138/82 (BP Location: Left Arm)   Pulse (!) 108   Temp 98.1 F (36.7 C) (Oral)   Resp 20   LMP 07/09/2018   SpO2 97%   Physical Exam Vitals signs and nursing note reviewed.    43 year old female, resting comfortably and in no acute distress. Vital signs are significant for mildly elevated heart rate. Oxygen saturation is 97%, which is normal. Head is normocephalic and atraumatic. PERRLA, EOMI. Oropharynx is clear. Neck is nontender and supple without adenopathy or JVD. Back is nontender and there is no CVA tenderness. Lungs are clear without rales, wheezes, or rhonchi. Chest is mildly tender in the left parasternal area. Heart has regular rate and rhythm without murmur. Abdomen is soft, flat, nontender without masses or hepatosplenomegaly and peristalsis is normoactive. Extremities have no cyanosis or edema, full range of motion is present. Skin is warm and dry without rash. Neurologic: Mental  status is normal, cranial nerves are intact, there are no motor or sensory deficits.  ED Treatments / Results  Labs (all labs ordered are listed, but only abnormal results are displayed) Labs Reviewed  BASIC METABOLIC PANEL - Abnormal; Notable for the following components:      Result Value   CO2 21 (*)    Glucose, Bld 120 (*)    All other components within normal limits  CBC - Abnormal; Notable for the following components:   Hemoglobin 10.8 (*)    MCV 79.0 (*)    MCH 22.9 (*)    MCHC 29.0 (*)    RDW 15.8 (*)    All other components within normal limits  I-STAT TROPONIN, ED  I-STAT BETA HCG BLOOD, ED ([facility], [facility], [facility] ONLY)    EKG EKG Interpretation  Date/Time:  Thursday August 02 2018 03:50:09 EST Ventricular Rate:  90 PR Interval:    QRS Duration: 85 QT Interval:  371 QTC Calculation: 454 R Axis:   14 Text Interpretation:  Sinus rhythm Low voltage, precordial leads Borderline T abnormalities, anterior leads When compared with ECG of 12/07/2017, No significant change was found Confirmed by Dione Booze ([id number]) on 08/02/2018 3:54:21 AM   Radiology Dg Chest 2 View  Result Date: 08/02/2018 CLINICAL DATA:  Chest pain EXAM: CHEST - 2 VIEW COMPARISON:  08/02/2017 FINDINGS: The heart size and mediastinal contours are within normal limits. Both lungs are clear. The visualized skeletal structures are unremarkable. IMPRESSION: No active cardiopulmonary disease. Electronically Signed   By: Deatra Robinson M.D.   On: 08/02/2018 03:18    Procedures Procedures  Medications Ordered in ED Medications  sodium chloride flush (NS) 0.9 % injection 3 mL (has no administration in time range)     Initial Impression / Assessment and Plan / ED Course  I have reviewed the triage vital signs and the nursing notes.  Pertinent labs & imaging results that were available during my care of the patient were reviewed by me and considered in my medical decision making (see chart for details).  Chest pain of  uncertain  cause.  ECG is unremarkable.  Old records are reviewed, and she had an ED visit 1 year ago for similar chest pain.  She had cardiac catheterization December 07, 2017 and had normal coronary arteries except for 30% stenosis in the mid LAD.  With almost normal cardiac catheterization less than a year ago and unremarkable ECG, very low risk for ACS.  Cause for her palpitations is unclear.  She may benefit from Holter monitor or event monitor.  ED work-up shows mild anemia which is unchanged from baseline.  Chest x-ray is unremarkable.  She is given a GI cocktail without any benefit.  She is given a dose of ibuprofen with significant improvement.  Pain is felt to be noncardiac.  She is referred back to her cardiologist for consideration for Holter monitoring or event monitoring, referred back to PCP for general medical care.  Advised use over-the-counter NSAIDs, return precautions discussed.  Final Clinical Impressions(s) / ED Diagnoses   Final diagnoses:  Nonspecific chest pain  Normocytic anemia  Palpitations    ED Discharge Orders    None       Dione Booze, MD 08/02/18 [phone]

## 2018-08-02 NOTE — ED Triage Notes (Signed)
Pt s/o chest pain, shortness of breath and left arm numbness that started approx 1 hour PTA. Denies other associated symptoms.

## 2018-08-02 NOTE — Telephone Encounter (Signed)
Patient is calling in for a refill of methylphenidate (RITALIN) 20 MG tablet to be sent to  [facility] [address] [place] - [address] [phone] (Phone) [phone] (Fax)

## 2018-08-14 ENCOUNTER — Encounter: Admitting: Internal Medicine

## 2018-08-14 ENCOUNTER — Ambulatory Visit (INDEPENDENT_AMBULATORY_CARE_PROVIDER_SITE_OTHER): Admitting: Internal Medicine

## 2018-08-14 ENCOUNTER — Other Ambulatory Visit: Payer: Self-pay

## 2018-08-14 ENCOUNTER — Encounter: Payer: Self-pay | Admitting: Internal Medicine

## 2018-08-14 VITALS — BP 142/89 | HR 114 | Temp 98.9°F | Ht 67.0 in | Wt 253.0 lb

## 2018-08-14 DIAGNOSIS — E1142 Type 2 diabetes mellitus with diabetic polyneuropathy: Secondary | ICD-10-CM

## 2018-08-14 DIAGNOSIS — F319 Bipolar disorder, unspecified: Secondary | ICD-10-CM

## 2018-08-14 DIAGNOSIS — G4733 Obstructive sleep apnea (adult) (pediatric): Secondary | ICD-10-CM

## 2018-08-14 DIAGNOSIS — R42 Dizziness and giddiness: Secondary | ICD-10-CM | POA: Diagnosis not present

## 2018-08-14 DIAGNOSIS — Z8619 Personal history of other infectious and parasitic diseases: Secondary | ICD-10-CM

## 2018-08-14 DIAGNOSIS — E039 Hypothyroidism, unspecified: Secondary | ICD-10-CM

## 2018-08-14 DIAGNOSIS — Z79899 Other long term (current) drug therapy: Secondary | ICD-10-CM

## 2018-08-14 DIAGNOSIS — G8929 Other chronic pain: Secondary | ICD-10-CM

## 2018-08-14 DIAGNOSIS — Z9119 Patient's noncompliance with other medical treatment and regimen: Secondary | ICD-10-CM

## 2018-08-14 DIAGNOSIS — D649 Anemia, unspecified: Secondary | ICD-10-CM | POA: Diagnosis not present

## 2018-08-14 DIAGNOSIS — F419 Anxiety disorder, unspecified: Secondary | ICD-10-CM

## 2018-08-14 DIAGNOSIS — Z794 Long term (current) use of insulin: Secondary | ICD-10-CM

## 2018-08-14 DIAGNOSIS — R002 Palpitations: Secondary | ICD-10-CM

## 2018-08-14 DIAGNOSIS — D509 Iron deficiency anemia, unspecified: Secondary | ICD-10-CM | POA: Diagnosis not present

## 2018-08-14 DIAGNOSIS — R109 Unspecified abdominal pain: Secondary | ICD-10-CM

## 2018-08-14 DIAGNOSIS — G47419 Narcolepsy without cataplexy: Secondary | ICD-10-CM

## 2018-08-14 DIAGNOSIS — Z915 Personal history of self-harm: Secondary | ICD-10-CM

## 2018-08-14 LAB — GLUCOSE, CAPILLARY: GLUCOSE-CAPILLARY: 117 mg/dL — AB (ref 70–99)

## 2018-08-14 NOTE — Assessment & Plan Note (Signed)
Previous halter monitor last year did not show acute findings for her symptoms. She continues to have intermittent, random dizziness, often at night when she is laying down. She denies SOB, current CP, N/V changes in BM, weight loss or chills. 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 Recent ED visit did not show new changes in EKG. She is mildly tachycardic today but has RRR on exam. She states has been taking her thyroid medication.  - orthostatics done, wnl   - I recommended that if she works on using her CPAP, this may help her tiredness enough to try decreasing or stopping the ritalin to see if this is what is causing her anxiety/palpitations/dizziness.  - Discussed also possibly switching to another medication for her narcolepsy, but she has been unable to afford these. - referral to financial counseling   - She may also benefit from an SSRI. She previously tried cymbalta, but states this "made her crazy." Will defer to her psychiatrist at [facility] but encouraged her to schedule f/u appointment to discuss the symptoms she has been having.  - discussed that she can also schedule f/u with cardiology but they would likely recommend the above before considering a loop recorder  - new microcytic anemia on CBC with ED visit 3/5 - ferritin, TIBC, iron - TSH

## 2018-08-14 NOTE — Assessment & Plan Note (Signed)
Discussed that her symptoms of tiredness which require ritalin, are due to her narcolepsy but are worsened by her sleep apnea and not using her CPAP machine.   - follow-up with Dr. Frances Furbish 4/9  - try to use CPAP machine againe, she states she will try

## 2018-08-14 NOTE — Patient Instructions (Signed)
Thank you for allowing [place] to provide your care today. Today we discussed your dizzyness, palpitations, and anxiety.     I have ordered the following labs for you:  Thyroid test   I will call if any are abnormal.    Please follow-up with [facility] to make an appointment.   Please also follow-up with your sleep specialist and try to wear your CPAP machine.   Please follow-up if symptoms worsen or fail to improve.   I have referred you to our financial adviser and they will call to make an appointment.   Should you have any questions or concerns please call the internal medicine clinic at [phone].

## 2018-08-14 NOTE — Progress Notes (Signed)
   CC: dizziness   HPI:  [name] is a 43 y.o. with PMH as below presenting with dizziness and chest palpitations. Her symptoms have been occurring on and off for the past year. She states two days ago was one of the worst times and involved chest pain as well. Last year, 06/2017, she was admitted after a syncopal episode with similar symptoms. Workup with cardiac cath and halter monitor showed no acute findings and her symptoms of palpitation were thought to possibly be due to ritalin, which she takes for narcolepsy. Her mother has similar symptoms of syncopal episodes and falling asleep randomly. She was diagnosed with OSA last year but has not been wearing her CPAP.  She most recently went to the ED 3/05 for chest pain with no acute findings, and she was discharge with recommendation to follow-up with cardiology. She states the dizziness comes and goes randomly and often occurs when she is lying in bed. She has a history of anxiety and bipolar disorder as well and states that the symptoms sometimes feel like her anxiety, which she states is very severe. She sees [facility] in the outpatient setting for her anxiety and bipolar disorder. She has a history of four suicide attempts, last when she was 38. She denies current SI or feelings of wanting to hurt herself.    Please see A&P for assessment of the patient's acute and chronic medical conditions.    Past Medical History:  Diagnosis Date  . Atypical chest pain    a. Cath 11/2017 - R/LHC showing 30% mid LAD, otherwise no significant disease, EF 55-65%, normal LVEDP, normal heart pressures (no evidence of significant CAD or CHF).  . COPD (chronic obstructive pulmonary disease) (HCC)   . Diabetes mellitus (HCC) 04/17/2017  . Fibromyalgia syndrome 04/17/2017  . GERD (gastroesophageal reflux disease) 04/17/2017  . Hypertension   . Hypothyroidism 04/17/2017  . 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 Obstructive sleep apnea 09/26/2017  . Recurrent  syncope   . Tobacco use 11/15/2017   Review of Systems:   Review of Systems  Constitutional: Negative for chills, fever and weight loss.  Respiratory: Negative for cough and shortness of breath.   Cardiovascular: Positive for palpitations. Negative for chest pain, orthopnea and leg swelling.  Gastrointestinal: Negative for abdominal pain, diarrhea, nausea and vomiting.  Musculoskeletal: Negative for falls.  Neurological: Positive for dizziness and tingling. Negative for speech change, focal weakness, loss of consciousness and headaches.  Psychiatric/Behavioral: Positive for depression. Negative for suicidal ideas. The patient is nervous/anxious.    Physical Exam:  Constitution: NAD, obese HENT: [place], AT Eyes: EOM intact, no injection  Cardio: tachycardic, regular rhythm, no m/r/g  Respiratory: CTA, no w/r/r  Abdominal: NTTP, non-distended, soft MSK: strength symmetrical Neuro: a&o, normal affect, cooperative  Skin: c/d/i    There were no vitals filed for this visit.   Assessment & Plan:   See Encounters Tab for problem based charting.  Patient discussed with Dr. Oswaldo Done

## 2018-08-14 NOTE — Progress Notes (Signed)
Internal Medicine Clinic Attending  Case discussed with Dr. Seawell at the time of the visit.  We reviewed the resident's history and exam and pertinent patient test results.  I agree with the assessment, diagnosis, and plan of care documented in the resident's note.    

## 2018-08-14 NOTE — Assessment & Plan Note (Signed)
Recent CBC showed new onset anemia with hemoglobin 10.8, previous levels have been normal. No previous ferritin. She has had ongoing dizziness for the past year. She endorses tingling in her hands which has been present for years.   - ferritin, iron, tibc

## 2018-08-15 ENCOUNTER — Other Ambulatory Visit: Payer: Self-pay | Admitting: Internal Medicine

## 2018-08-15 DIAGNOSIS — D509 Iron deficiency anemia, unspecified: Secondary | ICD-10-CM

## 2018-08-15 LAB — IRON AND TIBC
IRON SATURATION: 8 % — AB (ref 15–55)
IRON: 37 ug/dL (ref 27–159)
Total Iron Binding Capacity: 442 ug/dL (ref 250–450)
UIBC: 405 ug/dL (ref 131–425)

## 2018-08-15 LAB — TSH: TSH: 1.51 u[IU]/mL (ref 0.450–4.500)

## 2018-08-15 LAB — FERRITIN: Ferritin: 9 ng/mL — ABNORMAL LOW (ref 15–150)

## 2018-08-15 MED ORDER — POLYSACCHARIDE IRON COMPLEX 150 MG PO CAPS: 150.0000 mg | ORAL_CAPSULE | Freq: Every day | ORAL | 2 refills | Status: DC

## 2018-08-15 NOTE — Progress Notes (Deleted)
Labwork shows new onset iron deficiency anemia. Discussed results with her over the phone. She has not noticed any changes in the color of her stool, states they are brown in color. She has ongoing abdominal issues with a history of H. Pylori for which she continues to take  with constant pain, but states she eats well. Eating does not affect how her abdomen feels. She continues to have a menstrual cycle although she states it has decreased in the last few years from four days to only a couple days each month. Last cycle was one week ago.

## 2018-08-15 NOTE — Progress Notes (Signed)
Labwork shows new onset iron deficiency anemia. Discussed results with her over the phone. She has not noticed any changes in the color of her stool, states they are brown in color. She has ongoing abdominal issues with a history of H. Pylori for which she continues to take  with constant pain, but states she eats well. Eating does not affect how her abdomen feels. She continues to have a menstrual cycle although she states it has decreased in the last few years from four days to only a couple days each month. Last cycle was one week ago. Although her symptoms of abdominal pain, dizziness and feeling faint are chronic, with her new onset anemia and history of H.pylori and abdominal pain, will refer to GI for possible repeat colonoscopy and EGD as recent work-up of her symptoms has been negative.  - iron supplement - GI referral for EGD, colonoscopy - f/u one month for ferritin, CBC repeat

## 2018-08-21 ENCOUNTER — Encounter: Admitting: Internal Medicine

## 2018-08-26 ENCOUNTER — Encounter: Payer: Self-pay | Admitting: Family Medicine

## 2018-09-03 ENCOUNTER — Telehealth: Payer: Self-pay | Admitting: Neurology

## 2018-09-03 NOTE — Telephone Encounter (Signed)
Due to current COVID 19 pandemic, our office is severely reducing in office visits for at least the next 2 weeks, in order to minimize the risk to our patients and healthcare providers.   Called patient and received consent for virtual visit. Patient verbalized understanding of steps for setting up meeting via Webex. I advised patient that she will be receiving an e-mail from [place] with further instructions, as well as a phone call from Dr. Teofilo Pod nurse to update current history and medications  Pt understands that although there may be some limitations with this type of visit, we will take all precautions to reduce any security or privacy concerns.  Pt understands that this will be treated like an in office visit and we will file with pt's insurance, and there may be a patient responsible charge related to this service.  Pt's email is [email or web address]. Pt understands that the [employer] webex software must be downloaded and operational on the device pt plans to use for the visit.

## 2018-09-04 NOTE — Telephone Encounter (Signed)
I called pt. Pt's meds, allergies, and PMH were updated.   Pt reports that she is not using her cpap. She is going through a divorce and got laid off. She needs the ritalin to control her narcolepsy symptoms.  Pt reports that she has already downloaded the [employer] webex application and is ready for her virtual visit on 09/06/18.

## 2018-09-06 ENCOUNTER — Other Ambulatory Visit: Payer: Self-pay

## 2018-09-06 ENCOUNTER — Ambulatory Visit (INDEPENDENT_AMBULATORY_CARE_PROVIDER_SITE_OTHER): Admitting: Neurology

## 2018-09-06 ENCOUNTER — Encounter: Payer: Self-pay | Admitting: Neurology

## 2018-09-06 DIAGNOSIS — G47419 Narcolepsy without cataplexy: Secondary | ICD-10-CM | POA: Diagnosis not present

## 2018-09-06 DIAGNOSIS — F439 Reaction to severe stress, unspecified: Secondary | ICD-10-CM | POA: Diagnosis not present

## 2018-09-06 MED ORDER — METHYLPHENIDATE HCL 20 MG PO TABS: 20.0000 mg | ORAL_TABLET | Freq: Two times a day (BID) | ORAL | 0 refills | Status: DC

## 2018-09-06 NOTE — Patient Instructions (Signed)
Given verbally, during today's virtual video-based encounter, with verbal feedback received.   

## 2018-09-06 NOTE — Progress Notes (Signed)
Interim history:   Karen [name] is a 43 year old right-handed woman with an underlying medical history of mood disorder, suboptimally controlled diabetes, COPD, thyroid disease, smoking, hypertension, and morbid obesity, with whom I am conducting a virtual, video based follow-up appointment via Webex for follow-up consultation of her narcolepsy as well as mild sleep apnea. The patient is unaccompanied today and joins via cell phone from her car which appeared to initially be moving slowly but she reported that she was moving it out of the driveway to make room and was parked after that. I last saw her 03/07/2018, at which time she was not using her AutoPap. She was motivated to get back on it. She was complaining of left lateral thigh paresthesias. We increased her Ritalin to 20 mg twice a day at the time. I suggested we proceed with an EMG nerve conduction study in the interim. 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 We called her with her test results in the interim.  Today, 09/06/2018: Please also see below for virtual visit documentation.   I reviewed her AutoPap compliance data from the past 7 months and she has not been using her AutoPap machine with the exception of 2 days.  The patient's allergies, current medications, family history, past medical history, past social history, past surgical history and problem list were reviewed and updated as appropriate.    Previously (copied from previous notes for reference):   I saw her on 12/05/2017, at which time she was not fully compliant with her AutoPap. She was having difficulty tolerating AutoPap particularly secondary to mask issues. She was no longer on Cymbalta or BuSpar. She was on Abilify. She was also on Depakote. Suggested starting Provigil but she called back as it was too expensive, started her on generic Ritalin.    I reviewed her AutoPap compliance data from the past 30 days  from 02/03/2018 through 03/04/2018, which is a total of 30 days, during which time she used her AutoPap only 2 days, indicating noncompliance.   I was able to review a sleep study results in the interim from her previous sleep testing: Her study results from 03/24/2015: Sleep latency was 1.7 minutes, REM latency 6.5 minutes, total sleep time was 351.4 minutes. REM percentage was 11.2 of her total sleep time. She had no significant PLMS. AHI was 2.6 per hour. Lowest oxygen saturation was 87%. She had a multiple sleep latency test on 03/25/2015. She had a UDS which was negative. The patient achieved sleep in all 5 nap opportunities with a mean sleep latency of 37 seconds. She had two REM onset naps. Study was interpreted by Dr. Cleotis Nipper. 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   I first met her on 07/13/2017 at the request of her primary care physician, at which time the patient reported a prior diagnosis of narcolepsy. Prior sleep study results were not available. I suggested we proceed with sleep study testing and next a nap testing. She had a baseline sleep study, followed by a next a nap study. Her baseline sleep study from 08/21/2017 showed a sleep efficiency of 81.7% and a sleep latency delayed at 44.5 minutes. REM latency was borderline at 68 minutes. She had an increased percentage of stage II sleep, absence of slow-wave sleep and REM sleep was high normal at 25.4%. Total AHI was mildly abnormal at 6.5 per hour, REM AHI was in the moderate range at 24.8 per  average oxygen saturation was 95%, nadir was 85%. She had no significant PLMS. Next a nap study on 08/22/2017 showed a mean sleep latency of 10.4 minutes for 4 naps with 3 REM onset naps. She was advised that we should go ahead and treat her for her sleep apnea first with AutoPap and consider future testing with sleep study and nap study while treated for sleep apnea and also narcolepsy HLA testing. Her  UDS during the MSLT was positive for Benadryl. °  °I reviewed her AutoPap compliance data from 11/04/2000 through 12/03/2017 which is a total of 30 days, during which time she used her AutoPap 24 days with percent used days greater than 4 hours at 27%, indicating significantly suboptimal compliance with an average usage of 3 hours and 36 minutes only, residual AHI at goal at 1.7 per hour, 95th percentile pressure at 9.2 cm, leak on the high side for the 95th percentile at 36.2 L/m on a pressure range of 5-13 cm. In the past 90 days, her compliance was a little bit better, in the beginning, in mid April through mid May she had a compliance for 4 hours or more at 87% which was very good.  °  °07/13/2017: (She) reports significant daytime somnolence. She was previously diagnosed with narcolepsy with sleep studies several years ago while she was told [place]. I reviewed your office note from 07/10/2017. Prior sleep study results are not available for my review today. Of note, she is currently not on Ritalin since October 2018. She has been on generic Provigil. She was supposed to start Xyrem, but moved with her husband. She has been married since 10/18. She has not been on BuSpar or Cymbalta for about a month. She smokes half a pack per day or so, she does not drink alcohol on a regular basis. She drinks caffeine about 16 ounce per day in the form of soda.  °Her Epworth sleepiness score is 22 out of 24 today, fatigue score is 57 out of 63. She currently does not work. She has 2 children. She has gained weight over the past years, had lost some 80 lb after her BF died. 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 She then stopped drinking and gained about 40 lb in a year. She got married in October.  °She has 2 grown sons, 25 and 22. She has 3 GC. She reports a history of sleepiness that goes back to 2015 or maybe 2014. She was having problems at work. 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 She was sleepy at work, she was even late one day to work by an hour and did not realize it. She reports that she had fallen asleep at the wheel with her foot on the brake. She had a 15 minute commute only typically but had fallen asleep on her way while stopped in the middle lane as I understand. She has had some falling spells. She has no triggers for these. She has fallen in the shower and on the stairs. She reports that between 2014 and 2016 she had about 7-8 episodes of falling. She has had lightheadedness and dizzy spells. She has lost consciousness. She has fallen to the ground with the use without recollection and waking up with EMS being called by her husband. She has had some recent stressors including marital discord. She went to the emergency room last week because of the dizzy spell   spell and loss of consciousness. She likely had a syncopal spell. She had blood work and an EKG in the ER. She admits that she does not drink water very much. She reports a history of narcolepsy in her paternal aunt. Her mother has episodes of syncope. She reports that she snores loudly and her husband has taped her snoring. She wakes herself up from her snoring. Her bedtime schedule and rise time routine are currently more erratic. She sleeps better in the early morning hours. She tries to go to bed around 10.  Her Past Medical History Is Significant For: Past Medical History:  Diagnosis Date   Atypical chest pain    a. Cath 11/2017 - R/LHC showing 30% mid LAD, otherwise no significant disease, EF 55-65%, normal LVEDP, normal heart pressures (no evidence of significant CAD or CHF).   COPD (chronic obstructive pulmonary disease) (HCC)    Diabetes mellitus ([place]) 04/17/2017   Fibromyalgia syndrome 04/17/2017   GERD (gastroesophageal reflux disease) 04/17/2017   Hypertension    Hypothyroidism 04/17/2017   Manic depression ([place]) 04/17/2017   Obstructive sleep apnea 09/26/2017    Recurrent syncope    Tobacco use 11/15/2017    Her Past Surgical History Is Significant For: Past Surgical History:  Procedure Laterality Date   btl     CHOLECYSTECTOMY     RIGHT/LEFT HEART CATH AND CORONARY ANGIOGRAPHY N/A 12/07/2017   Procedure: RIGHT/LEFT HEART CATH AND CORONARY ANGIOGRAPHY;  Surgeon: Leonie Man, MD;  Location: [facility];  Service: Cardiovascular;  Laterality: N/A;   shoulder Bilateral     Her Family History Is Significant For: Family History  Problem Relation Age of Onset   Hypertension Mother    Syncope episode Mother    Heart disease Maternal Grandmother        unclear details    Her Social History Is Significant For: Social History   Socioeconomic History   Marital status: Married    Spouse name: Not on file   Number of children: Not on file   Years of education: Not on file   Highest education level: Not on file  Occupational History   Not on file  Social Needs   Financial resource strain: Not on file   Food insecurity:    Worry: Not on file    Inability: Not on file   Transportation needs:    Medical: Not on file    Non-medical: Not on file  Tobacco Use   Smoking status: Former Smoker    Packs/day: 0.30    Types: Cigarettes    Last attempt to quit: 10/29/2017    Years since quitting: 0.8   Smokeless tobacco: Never Used   Tobacco comment: 1/3 per day   Substance and Sexual Activity   Alcohol use: Not Currently   Drug use: No   Sexual activity: Not on file  Lifestyle   Physical activity:    Days per week: Not on file    Minutes per session: Not on file   Stress: Not on file  Relationships   Social connections:    Talks on phone: Not on file    Gets together: Not on file    Attends religious service: Not on file    Active member of club or organization: Not on file    Attends meetings of clubs or organizations: Not on file    Relationship status: Not on file  Other Topics Concern   Not on  file  Social History Narrative  Not on file  ° ° °Her Allergies Are:  °Allergies  °Allergen Reactions  °• Aspirin Other (See Comments)  °  Was told by her mother not to take it in the past but was put on it a few years ago and did fine without any adverse effect  °• Tylenol [Acetaminophen]   °  Does not take due to fatty liver disease  °:  ° °Her Current Medications Are:  °Outpatient Encounter Medications as of 09/06/2018  °Medication Sig  °• albuterol (PROVENTIL HFA;VENTOLIN HFA) 108 (90 Base) MCG/ACT inhaler Inhale 2 puffs into the lungs every 4 (four) hours as needed for wheezing or shortness of breath.  °• amLODipine (NORVASC) 10 MG tablet Take 1 tablet (10 mg total) by mouth daily.  °• ARIPiprazole (ABILIFY) 10 MG tablet Take 10 mg by mouth at bedtime.  °• Blood Glucose Monitoring Suppl (FREESTYLE LITE) DEVI Use to check blood sugar up to 3 times a day  °• divalproex (DEPAKOTE) 250 MG DR tablet Take 250 mg by mouth 2 (two) times daily.   °• empagliflozin (JARDIANCE) 10 MG TABS tablet Take 10 mg by mouth daily.  °• exenatide (BYETTA 10 MCG PEN) 10 MCG/0.04ML SOPN injection Inject 0.04 mLs (10 mcg total) into the skin 2 (two) times daily with a meal.  °• FLUoxetine (PROZAC) 20 MG tablet Take 20 mg by mouth daily.  °• gabapentin (NEURONTIN) 300 MG capsule Take 3 capsules (900 mg total) by mouth 2 (two) times daily.  °• glucose blood (FREESTYLE LITE) test strip Use to check blood sugar 3 times daily. diag code E11.9. insulin dependent  °• insulin aspart (NOVOLOG FLEXPEN) 100 UNIT/ML FlexPen Inject 10 Units into the skin 3 (three) times daily with meals.  °• Insulin Detemir (LEVEMIR FLEXPEN) 100 UNIT/ML Pen Inject 40 Units into the skin daily at 10 pm. IM program  °• Insulin Pen Needle (NOVOFINE PLUS) 32G X 4 MM MISC 1 Dose by Does not apply route 4 (four) times daily. Use with Levemir and Novolog, IM program  °• iron polysaccharides (NU-IRON) 150 MG capsule Take 1 capsule (150 mg total) by mouth daily.  °•  Lancets (FREESTYLE) lancets Use to check blood sugar 3 times daily. diag code E11.9. insulin dependent  °• levothyroxine (SYNTHROID, LEVOTHROID) 150 MCG tablet Take 1 tablet (150 mcg total) by mouth daily before breakfast.  °• lisinopril (PRINIVIL,ZESTRIL) 40 MG tablet Take 1 tablet (40 mg total) by mouth daily.  °• lovastatin (MEVACOR) 40 MG tablet Take 1 tablet (40 mg total) by mouth at bedtime.  °• metFORMIN (GLUCOPHAGE) 500 MG tablet Take 2 tablets (1,000 mg total) by mouth 2 (two) times daily with a meal.  °• methylphenidate (RITALIN) 20 MG tablet Take 1 tablet (20 mg total) by mouth 2 (two) times daily with breakfast and lunch. Take in the morning and at lunch, no later than 3 PM.  °• mirtazapine (REMERON) 15 MG tablet Take 15 mg by mouth at bedtime.  °• montelukast (SINGULAIR) 10 MG tablet Take 1 tablet (10 mg total) by mouth at bedtime.  °• omeprazole (PRILOSEC) 40 MG capsule Take 1 capsule (40 mg total) by mouth 2 (two) times daily at 8 am and 10 pm.  °• prazosin (MINIPRESS) 1 MG capsule Take 1 mg by mouth at bedtime.  °• sucralfate (CARAFATE) 1 g tablet Take 1 tablet (1 g total) by mouth 4 (four) times daily -  with meals and at bedtime for 7 days. (Patient taking differently: Take 1 g by mouth   daily. )   vitamin E 400 UNIT capsule Take 1 capsule (400 Units total) by mouth daily.   No facility-administered encounter medications on file as of 09/06/2018.   :  Review of Systems:  Out of a complete 14 point review of systems, all are reviewed and negative with the exception of these symptoms as listed below:  Virtual Visit via Video Note on @ TODAY@  I connected with@ on 09/06/18 at  2:00 PM EDT by a video enabled telemedicine application and verified that I am speaking with the correct person using two identifiers.   I discussed the limitations of evaluation and management by telemedicine and the availability of in person appointments. The patient expressed understanding and agreed to  proceed.  History of Present Illness: She reports going through stressful times currently, she is separated currently from her husband, she is wondering if she will eventually have a divorce. She is currently staying in a hotel by herself. She has contact with her son in [place] and son in [place]. She has a good report actually with her mother-in-law talks to her. She has been followed at [facility] and had a recent appointment with some medication changes. She has been on Remeron for sleep at night and is on Prozac as well as her other medications. She gets gabapentin from her primary care physician for leg pain. She has lost weight. She wonders if this is stress related. She is willing to continue with close follow-up with her mental health providers including virtual visits. She takes Ritalin twice daily, she tolerates it and it is helpful. Unfortunately, she got laid off, she worked in [employer] at [employer]. She states, they are losing their house.  Her left thigh paresthesias a better.   Observations/Objective:  The most recent vital signs for my review are from 08/14/2018: Blood pressure 142/89 with a pulse of 114, temperature 98.9, weight 253 for a BMI of 39.63. She has lost weight in the interim, her weight in October 2019 was 272.  She is conversant, no acute distress, not tearful. Face is symmetric, speech clear, no dysarthria, no pressured speech, no hypophonia. Extraocular movements are preserved. Upper body movements and upper extremity coordination grossly intact.  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 She is on symptomatic treatment  with Ritalin generic 20 mg twice a day currently with good success. Her baseline sleep study showed mild obstructive sleep apnea and she was advised to try AutoPap therapy. She had trouble tolerating it and is no longer on treatment. On the positive side she has lost weight which very well could have improved her sleep disordered breathing in the interim. She is encouraged to continue to work on [employer]. She has had interim significant increase in stress, she has good support from her mother-in-law and has a good report with her sons which are out of state. She is strongly encouraged to keep follow-up appointments with her mental health providers and also seek counseling if possible virtually. Her sleep study in the past fullfilled criteria for narcolepsy, but with out recent sleep study testing she did not meet all the criteria for narcolepsy. 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 She was not able to afford Provigil. She Is advised to continue with symptomatic treatment with Ritalin 20 mg twice a day. I will renew her prescription today. She is advised to follow-up in 3 months and hopefully face-to-face visit with one of our nurse practitioners. Of note, her sleep study results from [place], on 03/24/2015 showed a sleep latency of 1.7 minutes, REM latency 6.5 minutes, total sleep time was 351.4 minutes. REM percentage was 11.2 of her total sleep time. She had no significant PLMS. AHI was 2.6 per hour. Lowest oxygen saturation was 87%. She had a multiple sleep latency test on 03/25/2015: UDS was negative. The patient achieved sleep in all 5 nap opportunities with a mean sleep latency of 37 seconds. She had two REM onset naps. Study was interpreted by Dr. Heather Clawges. 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 °I  answered all her questions today and she was in agreement with the plan. ° °Follow Up Instructions: ° °1. Continue Ritalin generic 20 mg twice a day. °2. Continue close follow-up with mental health..  °3. Follow-up in our clinic with nurse practitioner in 3 months. °4. Offer any interim questions or concerns. °  °I discussed the assessment and treatment plan with the patient. The patient was provided an opportunity to ask questions and all were answered. The patient agreed with the plan and demonstrated an understanding of the instructions. °  °The patient was advised to call back or seek an in-person evaluation if the symptoms worsen or if the condition fails to improve as anticipated. ° °I provided 15 minutes of non-face-to-face time during this encounter. ° ° ° , MD ° ° ° ° ° ° °

## 2018-09-26 ENCOUNTER — Telehealth: Payer: Self-pay | Admitting: *Deleted

## 2018-09-26 IMAGING — DX DG CHEST 2V
2 series · 2 of 2 positions shown · non-contrast
Comparison: None.

CLINICAL DATA: Chest pain that began last night

EXAM:
CHEST - 2 VIEW

[chest pa]
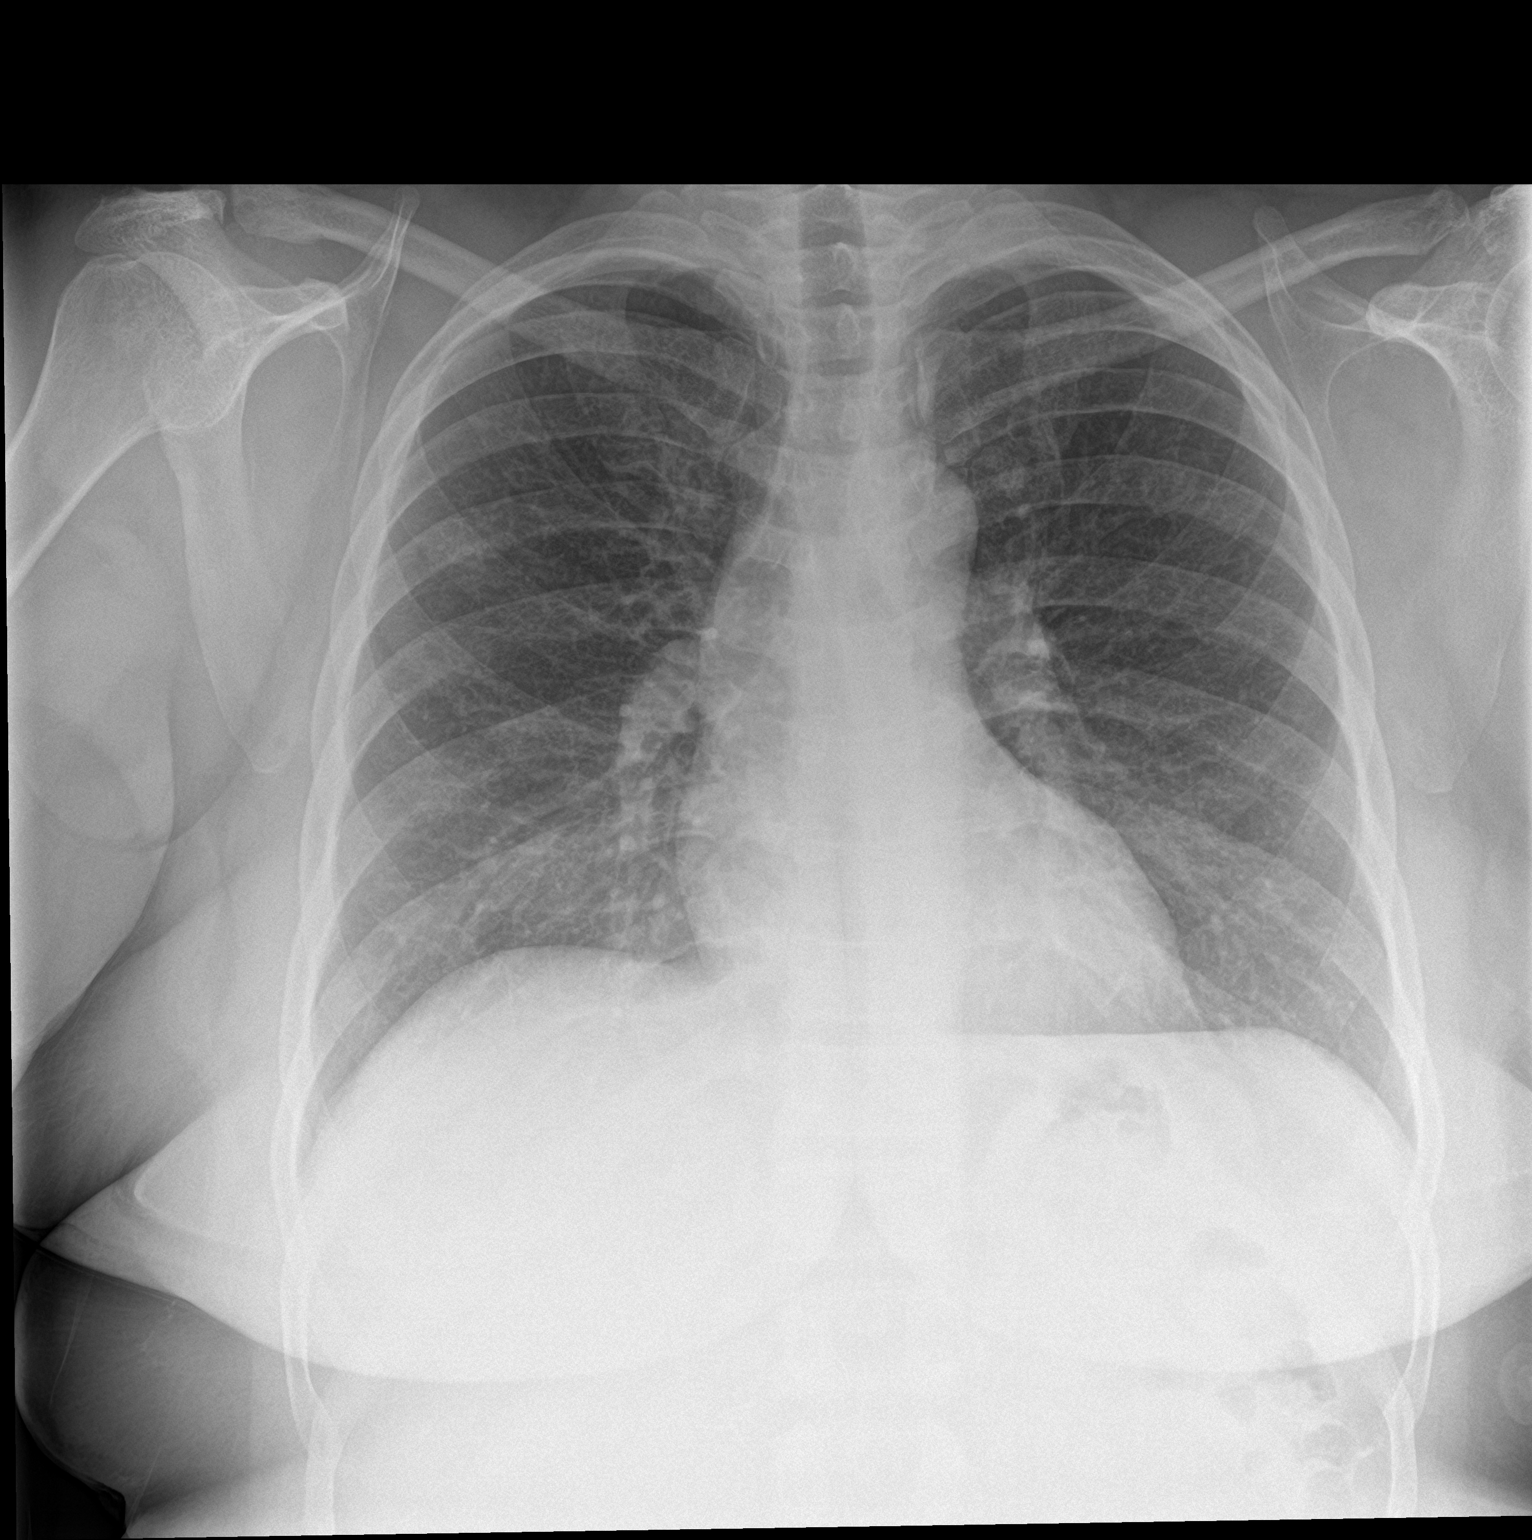

[chest lat]
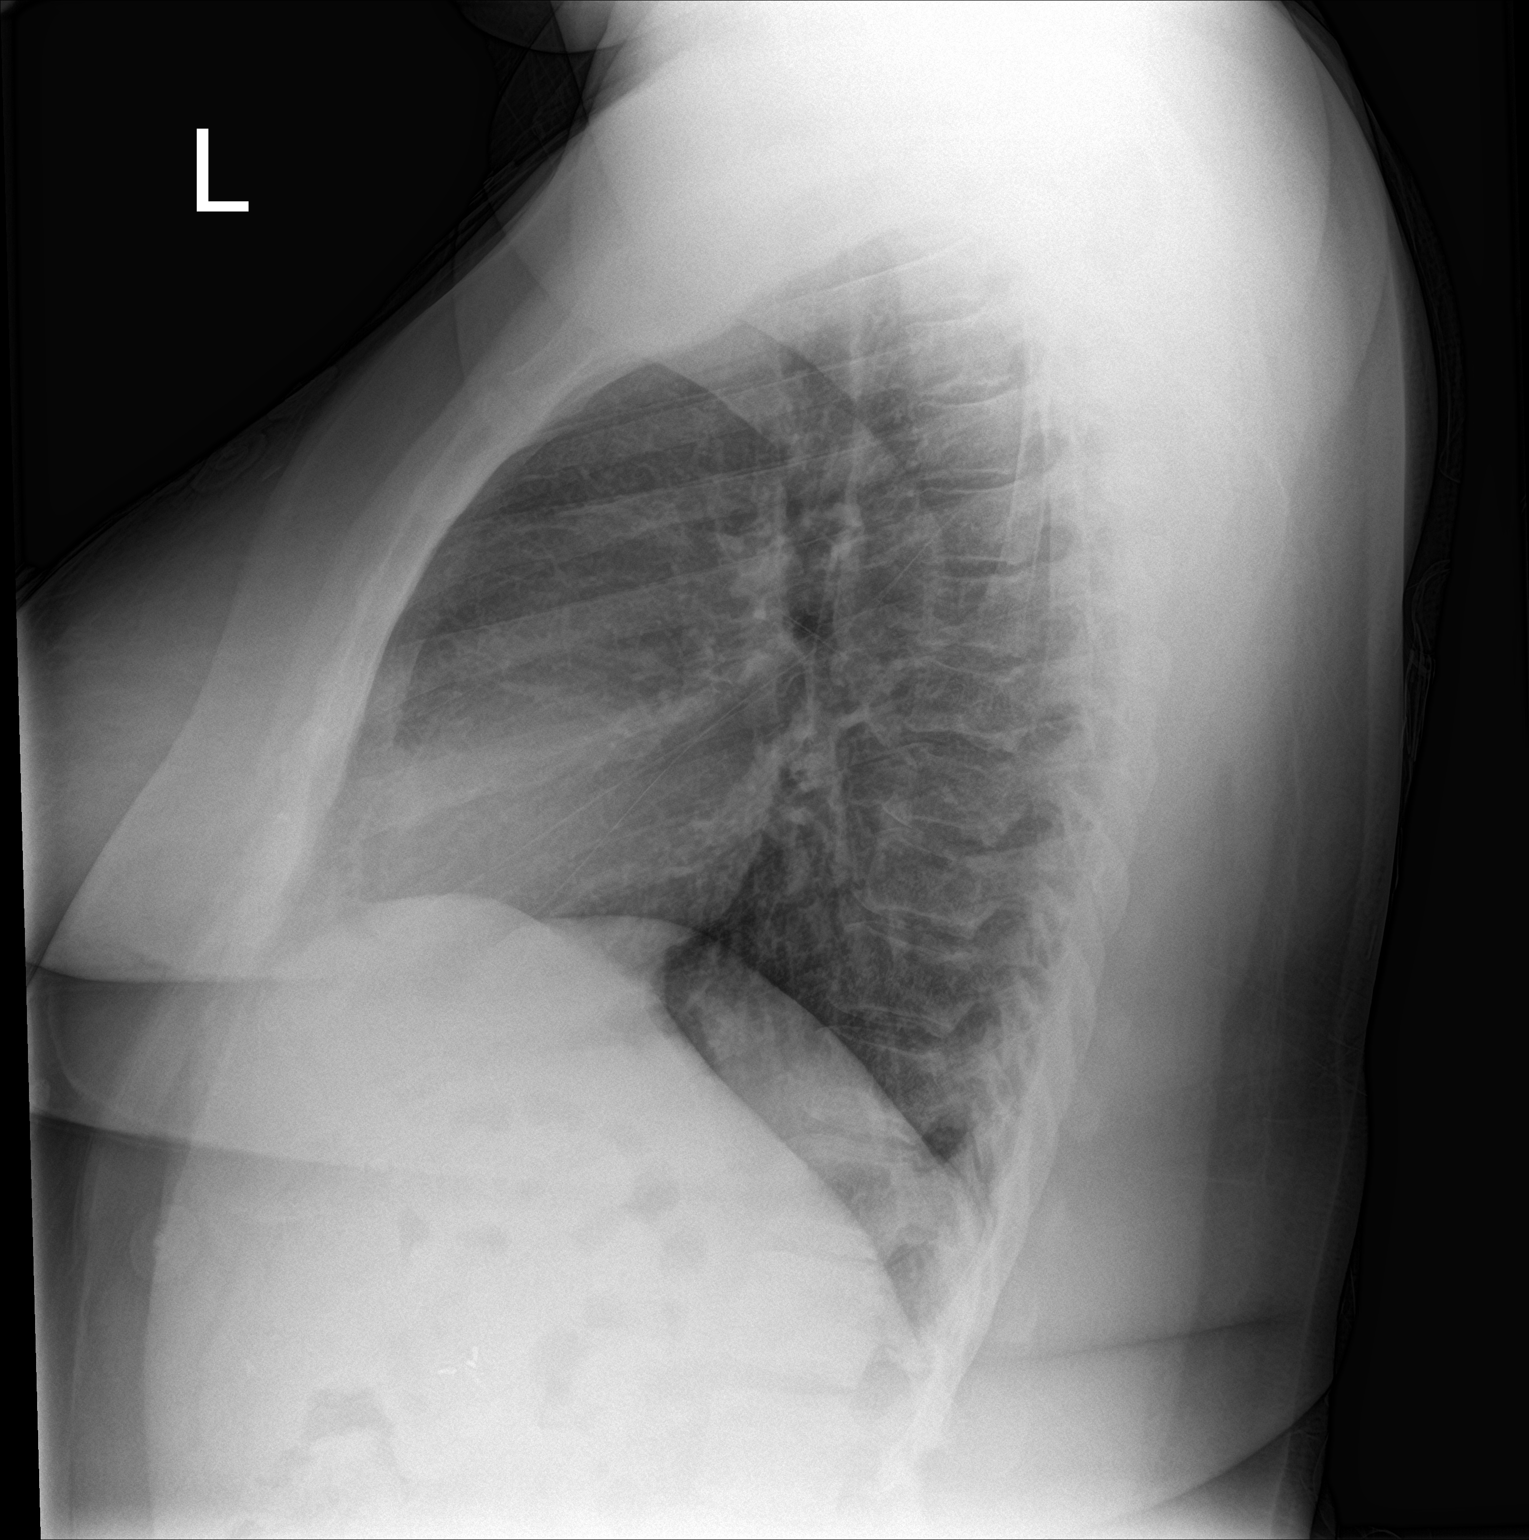

[2 of 2 positions shown; findings below may reference images not displayed]

FINDINGS: The heart size and mediastinal contours are within normal limits.
Both lungs are clear. The visualized skeletal structures are
unremarkable.
IMPRESSION: No active cardiopulmonary disease.

## 2018-09-26 NOTE — Telephone Encounter (Signed)
SPOKE WITH PATIENT REGARDING HER GI REFERRAL ( DR RHOTON-[phone]) TO SEE IF OFFICE HAS SPOKEN WITH HER. PATIENT STATES SHE HAS NOT SPOKEN WITH ANY ONE. THEIR OFFICE NUMBER WAS GIVEN TO PATIENT TO CONTACT THEM.   PATIENT IS ALSO ASKING ABOUT  DISABILITY PAPER THAT HAD BEEN SENT TO [place].  TOLD PATIENT I WILL FOLLOW UP FOR HER ON THIS.

## 2018-10-10 ENCOUNTER — Telehealth: Payer: Self-pay | Admitting: *Deleted

## 2018-10-10 NOTE — Telephone Encounter (Signed)
PATIENT WAS CONTACTED WITH THIS APPOINTMENT WITH DR RHOTON / GI.  APPT. SCHEDULED FOR MAY 20.2020 @ 2:00PM.  [id number]  [place],  [place]. [phone]. THIS INFORMATION GIVE TO PATIENT.

## 2018-11-03 IMAGING — DX DG ELBOW COMPLETE 3+V*L*
4 series · 4 of 4 positions shown · non-contrast
Comparison: None.

CLINICAL DATA: Fall.

EXAM:
LEFT ELBOW - COMPLETE 3+ VIEW

[elbow ap]
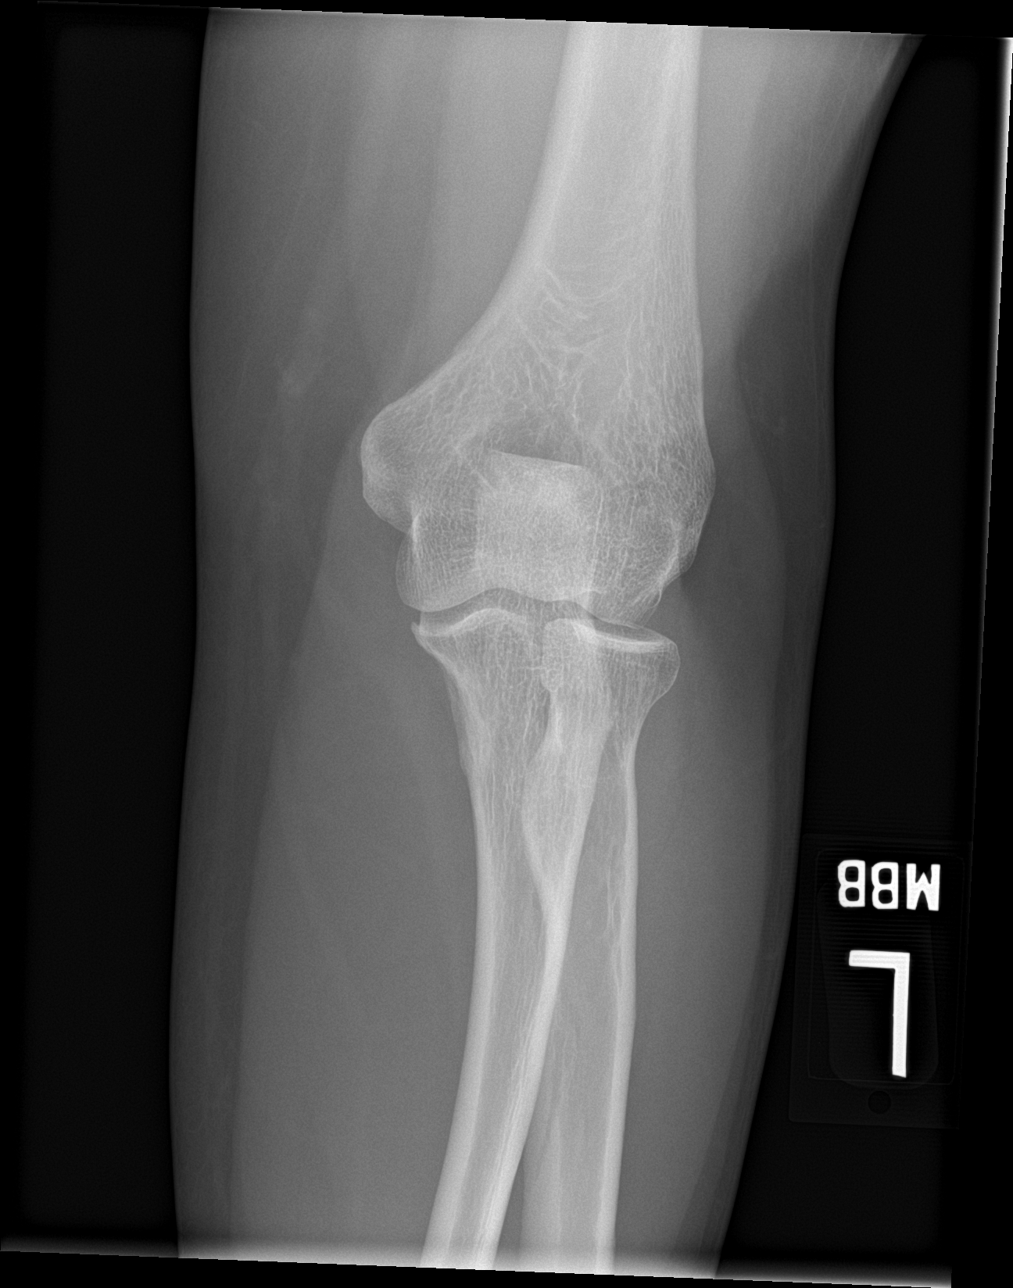

[elbow obl (1 of 2)]
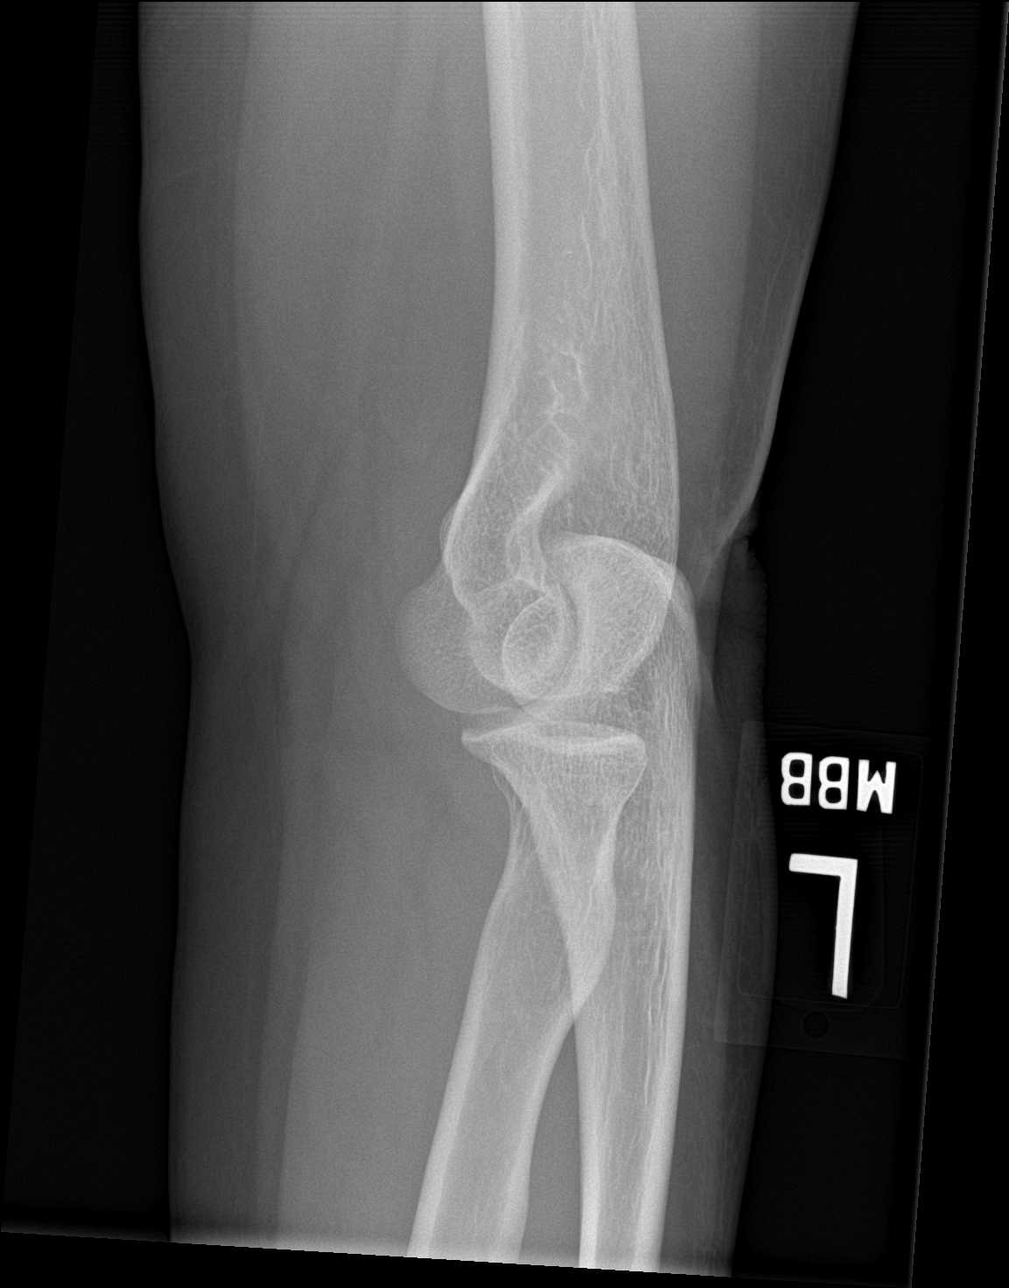

[elbow obl (2 of 2)]
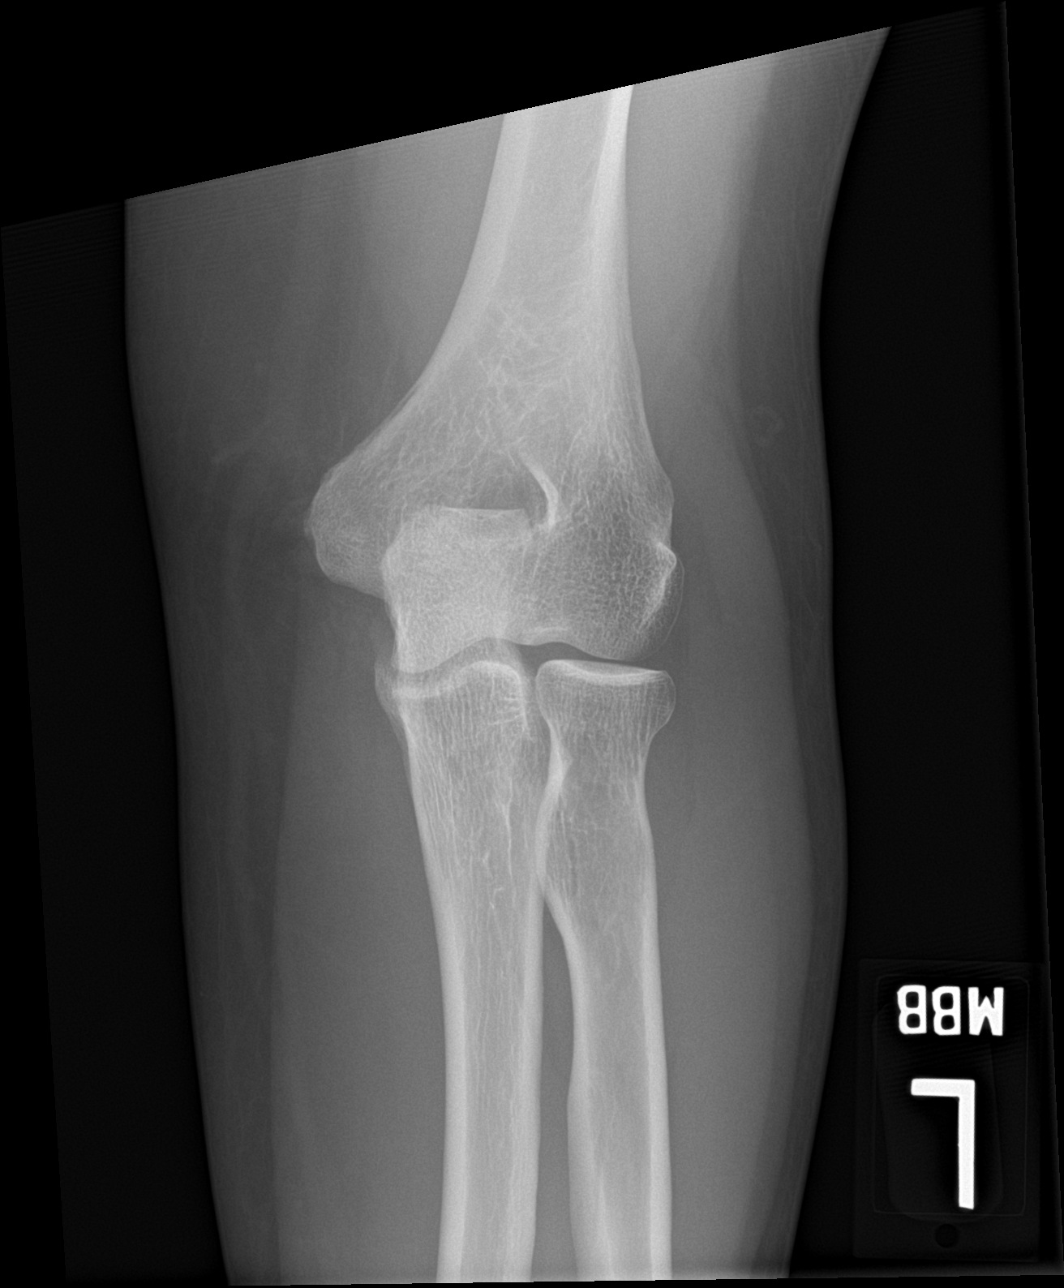

[elbow lat]
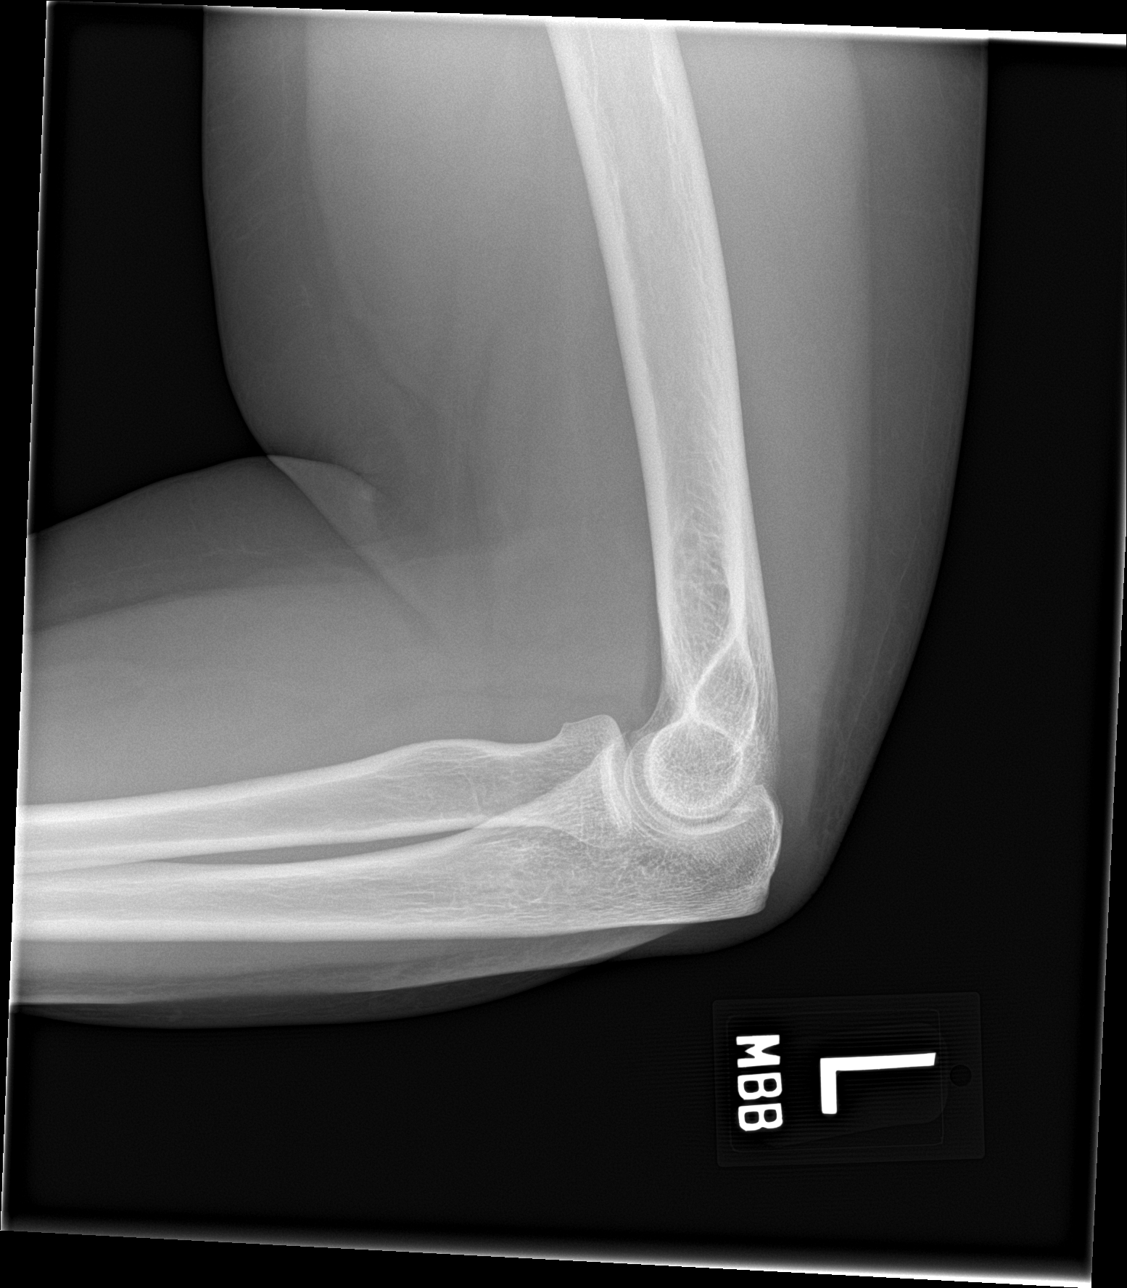

[4 of 4 positions shown; findings below may reference images not displayed]

FINDINGS: There is no evidence of fracture, dislocation, or joint effusion.
There is no evidence of arthropathy or other focal bone abnormality.
Soft tissues are unremarkable.
IMPRESSION: Negative.

## 2018-11-22 ENCOUNTER — Encounter: Payer: Self-pay | Admitting: *Deleted

## 2018-12-07 NOTE — Progress Notes (Addendum)
PATIENT: Karen [name] DOB: 25-Mar-1976  REASON FOR VISIT: follow up HISTORY FROM: patient  Chief Complaint  Patient presents with   Follow-up    3 mon f/u. Alone. Rm 1. No new concerns at this time.     HISTORY OF PRESENT ILLNESS: Today 12/17/18 Karen [name] is a 43 y.o. female here today for follow up of narcolepsy and mild sleep apnea. She was not using CPAP at last visit. She reports that her CPAP machine is in a storage building and she has not gotten it out. Unfortunately, she was forced to leave her home suddenly and is now living in a hotel. She has lost her job and is feeling more tired than normal. She states that she sleeps periodically throughout the day. She is snoring more than normal. She does continue Ritalin 17m twice daily which helps some.    HISTORY: (copied from Athar's note on 09/06/2018)  Karen [name] a 43year old right-handed woman with an underlying medical history of mood disorder, suboptimally controlled diabetes, COPD, thyroid disease, smoking, hypertension, and morbid obesity, with whom I am conducting a virtual, video based follow-up appointment via Webex for follow-up consultation of her narcolepsy as well as mild sleep apnea. The patient is unaccompanied today and joins via cell phone from her car which appeared to initially be moving slowly but she reported that she was moving it out of the driveway to make room and was parked after that. I last saw her 03/07/2018, at which time she was not using her AutoPap. She was motivated to get back on it. She was complaining of left lateral thigh paresthesias. We increased her Ritalin to 20 mg twice a day at the time. I suggested we proceed with an EMG nerve conduction study in the interim. 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 We called her with her test results in the interim.  Today, 09/06/2018: Please also see below for virtual visit documentation.    I reviewed her AutoPap compliance data from the past 7 months and she has not been using her AutoPap machine with the exception of 2 days.  The patient's allergies, current medications, family history, past medical history, past social history, past surgical history and problem list were reviewed and updated as appropriate.  Previously (copied from previous notes for reference):  I saw her on 12/05/2017, at which time she was not fully compliant with her AutoPap. She was having difficulty tolerating AutoPap particularly secondary to mask issues. She was no longer on Cymbalta or BuSpar. She was on Abilify. She was also on Depakote. Suggested starting Provigil but she called back as it was too expensive, started her on generic Ritalin.   I reviewed her AutoPap compliance data from the past 30 days from 02/03/2018 through 03/04/2018, which is a total of 30 days, during which time she used her AutoPap only 2 days, indicating noncompliance.   I was able to review a sleep study results in the interim from her previous sleep testing: Her study results from 03/24/2015: Sleep latency was 1.7 minutes, REM latency 6.5 minutes, total sleep time was 351.4 minutes. REM percentage was 11.2 of her total sleep time. She had no significant PLMS. AHI was 2.6 per hour. Lowest oxygen saturation was 87%. She had a multiple sleep latency test on 03/25/2015. She had a UDS which was negative. The patient achieved sleep in all 5 nap opportunities with a mean sleep latency of 37 seconds. She had  two REM onset naps. Study was interpreted by Karen [name]. 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  I first met her on 07/13/2017 at the request of her primary care physician, at which time the patient reported a prior diagnosis of narcolepsy. Prior sleep study results were not available. I suggested we proceed with sleep study testing and next a nap testing. She had a baseline sleep  study, followed by a next a nap study. Her baseline sleep study from 08/21/2017 showed a sleep efficiency of 81.7% and a sleep latency delayed at 44.5 minutes. REM latency was borderline at 68 minutes. She had an increased percentage of stage II sleep, absence of slow-wave sleep and REM sleep was high normal at 25.4%. Total AHI was mildly abnormal at 6.5 per hour, REM AHI was in the moderate range at 24.8 per hour, average oxygen saturation was 95%, nadir was 85%. She had no significant PLMS. Next a nap study on 08/22/2017 showed a mean sleep latency of 10.4 minutes for 4 naps with 3 REM onset naps. She was advised that we should go ahead and treat her for her sleep apnea first with AutoPap and consider future testing with sleep study and nap study while treated for sleep apnea and also narcolepsy HLA testing. Her UDS during the MSLT was positive for Benadryl.  I reviewed her AutoPap compliance data from 11/04/2000 through 12/03/2017 which is a total of 30 days, during which time she used her AutoPap 24 days with percent used days greater than 4 hours at 27%, indicating significantly suboptimal compliance with an average usage of 3 hours and 36 minutes only, residual AHI at goal at 1.7 per hour, 95thpercentile pressure at 9.2 cm, leak on the high side for the 95thpercentile at 36.2 L/m on a pressure range of 5-13 cm. In the past 90 days, her compliance was a little bit better, in the beginning, in mid April through mid May she had a compliance for 4 hours or more at 87% which was very good.   07/13/2017: (She) reports significant daytime somnolence. She was previously diagnosed with narcolepsy with sleep studies several years ago while she was told [place]. I reviewed your office note from 07/10/2017. Prior sleep study results are not available for my review today. Of note, she is currently not on Ritalin since October 2018. She has been on generic Provigil. She was supposed to start Xyrem, but moved with  her husband. She has been married since 10/18. She has not been on BuSpar or Cymbalta for about a month. She smokes half a pack per day or so, she does not drink alcohol on a regular basis. She drinks caffeine about 16 ounce per day in the form of soda.  Her Epworth sleepiness score is 22 out of 24 today, fatigue score is 57 out of 63. She currently does not work. She has 2 children. She has gained weight over the past years, had lost some 80 lb after her BF died. 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 She then stopped drinking and gained about 40 lb in a year. She got married in October.  She has 2 grown sons, 15 and 56. She has 3 GC. She reports a history of sleepiness that goes back to 2015 or maybe 2014. She was having problems at work. 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 She was sleepy at work, she was  even late one day to work by an hour and did not realize it. She reports that she had fallen asleep at the wheel with her foot on the brake. She had a 15 minute commute only typically but had fallen asleep on her way while stopped in the middle lane as I understand. She has had some falling spells. She has no triggers for these. She has fallen in the shower and on the stairs. She reports that between 2014 and 2016 she had about 7-8 episodes of falling. She has had lightheadedness and dizzy spells. She has lost consciousness. She has fallen to the ground with the use without recollection and waking up with EMS being called by her husband. She has had some recent stressors including marital discord. She went to the emergency room last week because of the dizzy spell and loss of consciousness. She likely had a syncopal spell. She had blood work and an EKG in the ER. She admits that she does not drink water very much. She reports a history of narcolepsy in her paternal aunt. Her mother has episodes of syncope. She reports that she snores loudly  and her husband has taped her snoring. She wakes herself up from her snoring. Her bedtime schedule and rise time routine are currently more erratic. She sleeps better in the early morning hours. She tries to go to bed around 10.  Ms. Camposano is a 43 year old right-handed woman with an underlying medical history of mood disorder, suboptimally controlled diabetes, COPD, thyroid disease, smoking, hypertension, and morbid obesity, with whom I am conducting a virtual, video based follow-up appointment via Webex for follow-up consultation of her narcolepsy as well as mild sleep apnea. The patient is unaccompanied today and joins via cell phone from her car which appeared to initially be moving slowly but she reported that she was moving it out of the driveway to make room and was parked after that. I last saw her 03/07/2018, at which time she was not using her AutoPap. She was motivated to get back on it. She was complaining of left lateral thigh paresthesias. We increased her Ritalin to 20 mg twice a day at the time. I suggested we proceed with an EMG nerve conduction study in the interim. 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 We called her with her test results in the interim.  Today, 09/06/2018: Please also see below for virtual visit documentation.   I reviewed her AutoPap compliance data from the past 7 months and she has not been using her AutoPap machine with the exception of 2 days.  The patient's allergies, current medications, family history, past medical history, past social history, past surgical history and problem list were reviewed and updated as appropriate.  Previously (copied from previous notes for reference):  I saw her on 12/05/2017, at which time she was not fully compliant with her AutoPap. She was having difficulty tolerating AutoPap particularly secondary to mask issues. She was no longer on Cymbalta or BuSpar. She was  on Abilify. She was also on Depakote. Suggested starting Provigil but she called back as it was too expensive, started her on generic Ritalin.   I reviewed her AutoPap compliance data from the past 30 days from 02/03/2018 through 03/04/2018, which is a total of 30 days, during which time she used her AutoPap only 2 days, indicating noncompliance.   I was able to review a sleep study results in the interim from her previous sleep  testing: Her study results from 03/24/2015: Sleep latency was 1.7 minutes, REM latency 6.5 minutes, total sleep time was 351.4 minutes. REM percentage was 11.2 of her total sleep time. She had no significant PLMS. AHI was 2.6 per hour. Lowest oxygen saturation was 87%. She had a multiple sleep latency test on 03/25/2015. She had a UDS which was negative. The patient achieved sleep in all 5 nap opportunities with a mean sleep latency of 37 seconds. She had two REM onset naps. Study was interpreted by Karen [name]. 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  I first met her on 07/13/2017 at the request of her primary care physician, at which time the patient reported a prior diagnosis of narcolepsy. Prior sleep study results were not available. I suggested we proceed with sleep study testing and next a nap testing. She had a baseline sleep study, followed by a next a nap study. Her baseline sleep study from 08/21/2017 showed a sleep efficiency of 81.7% and a sleep latency delayed at 44.5 minutes. REM latency was borderline at 68 minutes. She had an increased percentage of stage II sleep, absence of slow-wave sleep and REM sleep was high normal at 25.4%. Total AHI was mildly abnormal at 6.5 per hour, REM AHI was in the moderate range at 24.8 per hour, average oxygen saturation was 95%, nadir was 85%. She had no significant PLMS. Next a nap study on 08/22/2017 showed a mean sleep latency of 10.4 minutes for 4 naps with 3 REM onset naps. She was  advised that we should go ahead and treat her for her sleep apnea first with AutoPap and consider future testing with sleep study and nap study while treated for sleep apnea and also narcolepsy HLA testing. Her UDS during the MSLT was positive for Benadryl.  I reviewed her AutoPap compliance data from 11/04/2000 through 12/03/2017 which is a total of 30 days, during which time she used her AutoPap 24 days with percent used days greater than 4 hours at 27%, indicating significantly suboptimal compliance with an average usage of 3 hours and 36 minutes only, residual AHI at goal at 1.7 per hour, 95thpercentile pressure at 9.2 cm, leak on the high side for the 95thpercentile at 36.2 L/m on a pressure range of 5-13 cm. In the past 90 days, her compliance was a little bit better, in the beginning, in mid April through mid May she had a compliance for 4 hours or more at 87% which was very good.   07/13/2017: (She) reports significant daytime somnolence. She was previously diagnosed with narcolepsy with sleep studies several years ago while she was told [place]. I reviewed your office note from 07/10/2017. Prior sleep study results are not available for my review today. Of note, she is currently not on Ritalin since October 2018. She has been on generic Provigil. She was supposed to start Xyrem, but moved with her husband. She has been married since 10/18. She has not been on BuSpar or Cymbalta for about a month. She smokes half a pack per day or so, she does not drink alcohol on a regular basis. She drinks caffeine about 16 ounce per day in the form of soda.  Her Epworth sleepiness score is 22 out of 24 today, fatigue score is 57 out of 63. She currently does not work. She has 2 children. She has gained weight over the past years, had lost some 80 lb after her BF died. She has a Hx of excessive  alcohol use for several months after he died. She then stopped drinking and gained about 40 lb in a year. She got  married in October.  She has 2 grown sons, 59 and 49. She has 3 GC. She reports a history of sleepiness that goes back to 2015 or maybe 2014. She was having problems at work. 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 She was sleepy at work, she was even late one day to work by an hour and did not realize it. She reports that she had fallen asleep at the wheel with her foot on the brake. She had a 15 minute commute only typically but had fallen asleep on her way while stopped in the middle lane as I understand. She has had some falling spells. She has no triggers for these. She has fallen in the shower and on the stairs. She reports that between 2014 and 2016 she had about 7-8 episodes of falling. She has had lightheadedness and dizzy spells. She has lost consciousness. She has fallen to the ground with the use without recollection and waking up with EMS being called by her husband. She has had some recent stressors including marital discord. She went to the emergency room last week because of the dizzy spell and loss of consciousness. She likely had a syncopal spell. She had blood work and an EKG in the ER. She admits that she does not drink water very much. She reports a history of narcolepsy in her paternal aunt. Her mother has episodes of syncope. She reports that she snores loudly and her husband has taped her snoring. She wakes herself up from her snoring. Her bedtime schedule and rise time routine are currently more erratic. She sleeps better in the early morning hours. She tries to go to bed around 10.  REVIEW OF SYSTEMS: Out of a complete 14 system review of symptoms, the patient complains only of the following symptoms, dizziness, numbness, weakness and all other reviewed systems are negative.  ALLERGIES: Allergies  Allergen Reactions   Aspirin Other (See Comments)    Was told by her mother not to take it in the past but was put on it a few years ago and did fine  without any adverse effect   Tylenol [Acetaminophen]     Does not take due to fatty liver disease    HOME MEDICATIONS: Outpatient Medications Prior to Visit  Medication Sig Dispense Refill   albuterol (PROVENTIL HFA;VENTOLIN HFA) 108 (90 Base) MCG/ACT inhaler Inhale 2 puffs into the lungs every 4 (four) hours as needed for wheezing or shortness of breath. 3 Inhaler 3   amLODipine (NORVASC) 10 MG tablet Take 1 tablet (10 mg total) by mouth daily. 90 tablet 3   ARIPiprazole (ABILIFY) 10 MG tablet Take 10 mg by mouth at bedtime.     Blood Glucose Monitoring Suppl (FREESTYLE LITE) DEVI Use to check blood sugar up to 3 times a day 1 each 1   divalproex (DEPAKOTE) 250 MG DR tablet Take 250 mg by mouth 2 (two) times daily.      empagliflozin (JARDIANCE) 10 MG TABS tablet Take 10 mg by mouth daily. 90 tablet 3   exenatide (BYETTA 10 MCG PEN) 10 MCG/0.04ML SOPN injection Inject 0.04 mLs (10 mcg total) into the skin 2 (two) times daily with a meal. 45 pen 3   FLUoxetine (PROZAC) 20 MG tablet Take 20 mg by mouth daily.     gabapentin (NEURONTIN) 300  MG capsule Take 3 capsules (900 mg total) by mouth 2 (two) times daily. 540 capsule 3   glucose blood (FREESTYLE LITE) test strip Use to check blood sugar 3 times daily. diag code E11.9. insulin dependent 300 each 3   insulin aspart (NOVOLOG FLEXPEN) 100 UNIT/ML FlexPen Inject 10 Units into the skin 3 (three) times daily with meals. 45 mL 3   Insulin Detemir (LEVEMIR FLEXPEN) 100 UNIT/ML Pen Inject 40 Units into the skin daily at 10 pm. IM program 135 mL 3   Insulin Pen Needle (NOVOFINE PLUS) 32G X 4 MM MISC 1 Dose by Does not apply route 4 (four) times daily. Use with Levemir and Novolog, IM program 390 each 3   iron polysaccharides (NU-IRON) 150 MG capsule Take 1 capsule (150 mg total) by mouth daily. 30 capsule 2   Lancets (FREESTYLE) lancets Use to check blood sugar 3 times daily. diag code E11.9. insulin dependent 300 each 3    levothyroxine (SYNTHROID, LEVOTHROID) 150 MCG tablet Take 1 tablet (150 mcg total) by mouth daily before breakfast. 90 tablet 3   lisinopril (PRINIVIL,ZESTRIL) 40 MG tablet Take 1 tablet (40 mg total) by mouth daily. 90 tablet 3   lovastatin (MEVACOR) 40 MG tablet Take 1 tablet (40 mg total) by mouth at bedtime. 90 tablet 3   metFORMIN (GLUCOPHAGE) 500 MG tablet Take 2 tablets (1,000 mg total) by mouth 2 (two) times daily with a meal. 360 tablet 3   methylphenidate (RITALIN) 20 MG tablet Take 1 tablet (20 mg total) by mouth 2 (two) times daily with breakfast and lunch. Take in the morning and at lunch, no later than 3 PM. 60 tablet 0   mirtazapine (REMERON) 15 MG tablet Take 15 mg by mouth at bedtime.     montelukast (SINGULAIR) 10 MG tablet Take 1 tablet (10 mg total) by mouth at bedtime. 90 tablet 3   omeprazole (PRILOSEC) 40 MG capsule Take 1 capsule (40 mg total) by mouth 2 (two) times daily at 8 am and 10 pm. 180 capsule 3   prazosin (MINIPRESS) 1 MG capsule Take 1 mg by mouth at bedtime.     sucralfate (CARAFATE) 1 g tablet Take 1 tablet (1 g total) by mouth 4 (four) times daily -  with meals and at bedtime for 7 days. (Patient taking differently: Take 1 g by mouth daily. ) 28 tablet 0   vitamin E 400 UNIT capsule Take 1 capsule (400 Units total) by mouth daily. 30 capsule 0   No facility-administered medications prior to visit.     PAST MEDICAL HISTORY: Past Medical History:  Diagnosis Date   Atypical chest pain    a. Cath 11/2017 - R/LHC showing 30% mid LAD, otherwise no significant disease, EF 55-65%, normal LVEDP, normal heart pressures (no evidence of significant CAD or CHF).   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   Obstructive sleep apnea  09/26/2017   Recurrent syncope    Tobacco use 11/15/2017    PAST SURGICAL HISTORY: Past Surgical History:  Procedure Laterality Date   btl     CHOLECYSTECTOMY     RIGHT/LEFT HEART CATH AND CORONARY ANGIOGRAPHY N/A 12/07/2017   Procedure: RIGHT/LEFT HEART CATH AND CORONARY ANGIOGRAPHY;  Surgeon: Leonie Man, MD;  Location: [facility];  Service: Cardiovascular;  Laterality: N/A;   shoulder Bilateral     FAMILY HISTORY: Family History  Problem Relation Age of Onset   Hypertension Mother    Syncope episode Mother    Heart disease Maternal Grandmother        unclear details    SOCIAL HISTORY: Social History   Socioeconomic History   Marital status: Married    Spouse name: Not on file   Number of children: Not on file   Years of education: Not on file   Highest education level: Not on file  Occupational History   Not on file  Social Needs   Financial resource strain: Not on file   Food insecurity    Worry: Not on file    Inability: Not on file   Transportation needs    Medical: Not on file    Non-medical: Not on file  Tobacco Use   Smoking status: Former Smoker    Packs/day: 0.30    Types: Cigarettes    Quit date: 10/29/2017    Years since quitting: 1.1   Smokeless tobacco: Never Used   Tobacco comment: 1/3 per day   Substance and Sexual Activity   Alcohol use: Not Currently   Drug use: No   Sexual activity: Not on file  Lifestyle   Physical activity    Days per week: Not on file    Minutes per session: Not on file   Stress: Not on file  Relationships   Social connections    Talks on phone: Not on file    Gets together: Not on file    Attends religious service: Not on file    Active member of club or organization: Not on file    Attends meetings of clubs or organizations: Not on file    Relationship status: Not on file   Intimate partner violence    Fear of current or ex partner: Not on file    Emotionally abused: Not  on file    Physically abused: Not on file    Forced sexual activity: Not on file  Other Topics Concern   Not on file  Social History Narrative   Not on file      PHYSICAL EXAM  Vitals:   12/17/18 1341  BP: 136/85  Pulse: 82  Temp: 98.2 F (36.8 C)  TempSrc: Oral  Weight: 260 lb 6.4 oz (118.1 kg)  Height: 5' 7"  (1.702 m)   Body mass index is 40.78 kg/m.  Generalized: Well developed, in no acute distress  Cardiology: normal rate and rhythm, no murmur noted Neurological examination  Mentation: Alert oriented to time, place, history taking. Follows all commands speech and language fluent Cranial nerve II-XII: Pupils were equal round reactive to light. Extraocular movements were full, visual field were full on confrontational test. Facial sensation and strength were normal. Uvula tongue midline. Head turning and shoulder shrug  were normal and symmetric. Motor: The motor testing reveals 5 over 5 strength of all 4 extremities. Good symmetric motor tone is noted throughout.  Coordination: Cerebellar testing reveals good finger-nose-finger and heel-to-shin bilaterally.  Gait and station: Gait is normal.    DIAGNOSTIC DATA (LABS, IMAGING, TESTING) - I reviewed patient records, labs, notes, testing and imaging myself where available.  No flowsheet data found.   Lab Results  Component Value Date   WBC 8.7 08/02/2018   HGB 10.8 (L) 08/02/2018   HCT 37.3 08/02/2018   MCV 79.0 (L) 08/02/2018   PLT 268 08/02/2018  Component Value Date/Time   NA 136 08/02/2018 0337   NA 140 12/28/2017 1451   K 3.5 08/02/2018 0337   CL 104 08/02/2018 0337   CO2 21 (L) 08/02/2018 0337   GLUCOSE 120 (H) 08/02/2018 0337   BUN 8 08/02/2018 0337   BUN 10 12/28/2017 1451   CREATININE 0.75 08/02/2018 0337   CALCIUM 8.9 08/02/2018 0337   PROT 6.9 08/07/2017 1001   ALBUMIN 4.1 08/07/2017 1001   AST 17 08/07/2017 1001   ALT 21 08/07/2017 1001   ALKPHOS 107 08/07/2017 1001   BILITOT <0.2  08/07/2017 1001   GFRNONAA >60 08/02/2018 0337   GFRAA >60 08/02/2018 0337   No results found for: CHOL, HDL, LDLCALC, LDLDIRECT, TRIG, CHOLHDL Lab Results  Component Value Date   HGBA1C 7.6 (A) 05/15/2018   Lab Results  Component Value Date   VITAMINB12 1,599 (H) 02/20/2018   Lab Results  Component Value Date   TSH 1.510 08/14/2018       ASSESSMENT AND PLAN 43 y.o. year old female  has a past medical history of Atypical chest pain, COPD (chronic obstructive pulmonary disease) ([place]), Diabetes mellitus ([place]) (04/17/2017), Fibromyalgia syndrome (04/17/2017), GERD (gastroesophageal reflux disease) (04/17/2017), Hypertension, Hypothyroidism (04/17/2017), Manic depression ([place]) (04/17/2017), Obstructive sleep apnea (09/26/2017), Recurrent syncope, and Tobacco use (11/15/2017). here with     ICD-10-CM   1. Obstructive sleep apnea  G47.33   2. Narcolepsy without cataplexy  G47.419     She continues to be noncompliant with CPAP usage. She has had some unfortunate events occur and has not gotten her CPAP machine out of storage. We have had a long discussion regarding connection of fatigue and untreated sleep apnea, risks of untreated sleep apnea and need for compliance with therapy. She appears motivated to restart CPAP and states that she will go through her storage to find machine. She was advised to continue Ritalin 3m twice daily. We will follow up in three months to assess response to CPAP. She verbalizes understanding and agreement with the plan.    No orders of the defined types were placed in this encounter.    No orders of the defined types were placed in this encounter.     I spent 15 minutes with the patient. 50% of this time was spent counseling and educating patient on plan of care and medications.    ADebbora Presto FNP-C 12/17/2018, 2:18 PM [facility] [address] [place] [place] [phone] I reviewed the above note and  documentation by the Nurse Practitioner and agree with the history, exam, assessment and plan as outlined above. I was immediately available for consultation. SStar Age MD, PhD [facility] ([facility]

## 2018-12-11 ENCOUNTER — Other Ambulatory Visit: Payer: Self-pay | Admitting: Neurology

## 2018-12-11 MED ORDER — METHYLPHENIDATE HCL 20 MG PO TABS: 20.0000 mg | ORAL_TABLET | Freq: Two times a day (BID) | ORAL | 0 refills | Status: DC

## 2018-12-11 NOTE — Telephone Encounter (Signed)
Pt is due for a refill on ritalin. [place] [facility] checked. Pt is up to date on her appts.

## 2018-12-11 NOTE — Addendum Note (Signed)
Addended by: Lester [place] A on: 12/11/2018 02:28 PM   Modules accepted: Orders

## 2018-12-11 NOTE — Telephone Encounter (Signed)
Ritalin RX printed. Will resend to Dr. Rexene Alberts to e-scribe.

## 2018-12-11 NOTE — Addendum Note (Signed)
Addended by: Lester [place] A on: 12/11/2018 02:24 PM   Modules accepted: Orders

## 2018-12-11 NOTE — Telephone Encounter (Signed)
Pt is requesting a refill of methylphenidate (RITALIN) 20 MG tablet , to be sent to [facility], [place].

## 2018-12-17 ENCOUNTER — Encounter: Payer: Self-pay | Admitting: Family Medicine

## 2018-12-17 ENCOUNTER — Ambulatory Visit (INDEPENDENT_AMBULATORY_CARE_PROVIDER_SITE_OTHER): Admitting: Family Medicine

## 2018-12-17 ENCOUNTER — Other Ambulatory Visit: Payer: Self-pay

## 2018-12-17 VITALS — BP 136/85 | HR 82 | Temp 98.2°F | Ht 67.0 in | Wt 260.4 lb

## 2018-12-17 DIAGNOSIS — G4733 Obstructive sleep apnea (adult) (pediatric): Secondary | ICD-10-CM

## 2018-12-17 DIAGNOSIS — G47419 Narcolepsy without cataplexy: Secondary | ICD-10-CM | POA: Diagnosis not present

## 2018-12-17 NOTE — Patient Instructions (Signed)
Get CPAP out of storage, use CPAP nightly and for greater than 4 hours each night  Continue Ritalin 20mg  twice daily  Follow up in 3 months  CPAP and BPAP Information CPAP and BPAP are methods of helping a person breathe with the use of air pressure. CPAP stands for "continuous positive airway pressure." BPAP stands for "bi-level positive airway pressure." In both methods, air is blown through your nose or mouth and into your air passages to help you breathe well. CPAP and BPAP use different amounts of pressure to blow air. With CPAP, the amount of pressure stays the same while you breathe in and out. With BPAP, the amount of pressure is increased when you breathe in (inhale) so that you can take larger breaths. Your health care provider will recommend whether CPAP or BPAP would be more helpful for you. Why are CPAP and BPAP treatments used? CPAP or BPAP can be helpful if you have:  Sleep apnea.  Chronic obstructive pulmonary disease (COPD).  Heart failure.  Medical conditions that weaken the muscles of the chest including muscular dystrophy, or neurological diseases such as amyotrophic lateral sclerosis (ALS).  Other problems that cause breathing to be weak, abnormal, or difficult. CPAP is most commonly used for obstructive sleep apnea (OSA) to keep the airways from collapsing when the muscles relax during sleep. How is CPAP or BPAP administered? Both CPAP and BPAP are provided by a small machine with a flexible plastic tube that attaches to a plastic mask. You wear the mask. Air is blown through the mask into your nose or mouth. The amount of pressure that is used to blow the air can be adjusted on the machine. Your health care provider will determine the pressure setting that should be used based on your individual needs. When should CPAP or BPAP be used? In most cases, the mask only needs to be worn during sleep. Generally, the mask needs to be worn throughout the night and during any  daytime naps. People with certain medical conditions may also need to wear the mask at other times when they are awake. Follow instructions from your health care provider about when to use the machine. What are some tips for using the mask?   Because the mask needs to be snug, some people feel trapped or closed-in (claustrophobic) when first using the mask. If you feel this way, you may need to get used to the mask. One way to do this is by holding the mask loosely over your nose or mouth and then gradually applying the mask more snugly. You can also gradually increase the amount of time that you use the mask.  Masks are available in various types and sizes. Some fit over your mouth and nose while others fit over just your nose. If your mask does not fit well, talk with your health care provider about getting a different one.  If you are using a mask that fits over your nose and you tend to breathe through your mouth, a chin strap may be applied to help keep your mouth closed.  The CPAP and BPAP machines have alarms that may sound if the mask comes off or develops a leak.  If you have trouble with the mask, it is very important that you talk with your health care provider about finding a way to make the mask easier to tolerate. Do not stop using the mask. Stopping the use of the mask could have a negative impact on your health. What  are some tips for using the machine?  Place your CPAP or BPAP machine on a secure table or stand near an electrical outlet.  Know where the on/off switch is located on the machine.  Follow instructions from your health care provider about how to set the pressure on your machine and when you should use it.  Do not eat or drink while the CPAP or BPAP machine is on. Food or fluids could get pushed into your lungs by the pressure of the CPAP or BPAP.  Do not smoke. Tobacco smoke residue can damage the machine.  For home use, CPAP and BPAP machines can be rented or  purchased through home health care companies. Many different brands of machines are available. Renting a machine before purchasing may help you find out which particular machine works well for you.  Keep the CPAP or BPAP machine and attachments clean. Ask your health care provider for specific instructions. Get help right away if:  You have redness or open areas around your nose or mouth where the mask fits.  You have trouble using the CPAP or BPAP machine.  You cannot tolerate wearing the CPAP or BPAP mask.  You have pain, discomfort, and bloating in your abdomen. Summary  CPAP and BPAP are methods of helping a person breathe with the use of air pressure.  Both CPAP and BPAP are provided by a small machine with a flexible plastic tube that attaches to a plastic mask.  If you have trouble with the mask, it is very important that you talk with your health care provider about finding a way to make the mask easier to tolerate. This information is not intended to replace advice given to you by your health care provider. Make sure you discuss any questions you have with your health care provider. Document Released: 02/12/2004 Document Revised: 09/05/2018 Document Reviewed: 04/04/2016 Elsevier Patient Education  2020 [employer].

## 2019-03-20 ENCOUNTER — Encounter: Payer: Self-pay | Admitting: Family Medicine

## 2019-03-20 ENCOUNTER — Ambulatory Visit: Admitting: Family Medicine

## 2019-03-20 NOTE — Progress Notes (Deleted)
PATIENT: Karen [name] DOB: [birth year]  REASON FOR VISIT: follow up HISTORY FROM: patient  No chief complaint on file.    HISTORY OF PRESENT ILLNESS: Today 03/20/19 Karen [name] is a 43 y.o. female here today for follow up.   HISTORY: (copied from my note on 12/17/2018)  Karen [name] is a 43 y.o. female here today for follow up of narcolepsy and mild sleep apnea. She was not using CPAP at last visit. She reports that her CPAP machine is in a storage building and she has not gotten it out. Unfortunately, she was forced to leave her home suddenly and is now living in a hotel. She has lost her job and is feeling more tired than normal. She states that she sleeps periodically throughout the day. She is snoring more than normal. She does continue Ritalin 22m twice daily which helps some.    HISTORY: (copied from Athar's note on 09/06/2018)  Ms. MDocteris a 43year old right-handed woman with an underlying medical history of mood disorder, suboptimally controlled diabetes, COPD, thyroid disease, smoking, hypertension, and morbid obesity, with whom I am conducting a virtual, video based follow-up appointment via Webex for follow-up consultation of her narcolepsy as well as mild sleep apnea. The patient is unaccompanied today and joins viacell phone from her car which appeared to initially be moving slowly but she reported that she was moving it out of the driveway to make room and was parked after that.I last saw her 03/07/2018, at which time she was not using her AutoPap. She was motivated to get back on it. She was complaining of left lateral thigh paresthesias. We increased her Ritalin to 20 mg twice a day at the time. I suggested we proceed with an EMG nerve conduction study in the interim. 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 We called her with her test results in the interim.  Today, 09/06/2018: Please also see below for  virtual visit documentation.   I reviewed her AutoPap compliance data from the past 7 months and she has not been using her AutoPap machine with the exception of 2 days.  The patient's allergies, current medications, family history, past medical history, past social history, past surgical history and problem list were reviewed and updated as appropriate.  Previously (copied from previous notes for reference):  I saw her on 12/05/2017, at which time she was not fully compliant with her AutoPap. She was having difficulty tolerating AutoPap particularly secondary to mask issues. She was no longer on Cymbalta or BuSpar. She was on Abilify. She was also on Depakote. Suggested starting Provigil but she called back as it was too expensive, started her on generic Ritalin.   I reviewed her AutoPap compliance data from the past 30 days from 02/03/2018 through 03/04/2018, which is a total of 30 days, during which time she used her AutoPap only 2 days, indicating noncompliance.   I was able to review a sleep study results in the interim from her previous sleep testing: Her study results from 03/24/2015: Sleep latency was 1.7 minutes, REM latency 6.5 minutes, total sleep time was 351.4 minutes. REM percentage was 11.2 of her total sleep time. She had no significant PLMS. AHI was 2.6 per hour. Lowest oxygen saturation was 87%. She had a multiple sleep latency test on 03/25/2015. She had a UDS which was negative. The patient achieved sleep in all 5 nap opportunities with a mean sleep latency of 37 seconds. She had  two REM onset naps. Study was interpreted by Dr. Tera Partridge. 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  I first met her on 07/13/2017 at the request of her primary care physician, at which time the patient reported a prior diagnosis of narcolepsy. Prior sleep study results were not available. I suggested we proceed with sleep study testing and next a nap  testing. She had a baseline sleep study, followed by a next a nap study. Her baseline sleep study from 08/21/2017 showed a sleep efficiency of 81.7% and a sleep latency delayed at 44.5 minutes. REM latency was borderline at 68 minutes. She had an increased percentage of stage II sleep, absence of slow-wave sleep and REM sleep was high normal at 25.4%. Total AHI was mildly abnormal at 6.5 per hour, REM AHI was in the moderate range at 24.8 per hour, average oxygen saturation was 95%, nadir was 85%. She had no significant PLMS. Next a nap study on 08/22/2017 showed a mean sleep latency of 10.4 minutes for 4 naps with 3 REM onset naps. She was advised that we should go ahead and treat her for her sleep apnea first with AutoPap and consider future testing with sleep study and nap study while treated for sleep apnea and also narcolepsy HLA testing. Her UDS during the MSLT was positive for Benadryl.  I reviewed her AutoPap compliance data from 11/04/2000 through 12/03/2017 which is a total of 30 days, during which time she used her AutoPap 24 days with percent used days greater than 4 hours at 27%, indicating significantly suboptimal compliance with an average usage of 3 hours and 36 minutes only, residual AHI at goal at 1.7 per hour, 95thpercentile pressure at 9.2 cm, leak on the high side for the 95thpercentile at 36.2 L/m on a pressure range of 5-13 cm. In the past 90 days, her compliance was a little bit better, in the beginning, in mid April through mid May she had a compliance for 4 hours or more at 87% which was very good.   07/13/2017: (She) reports significant daytime somnolence. She was previously diagnosed with narcolepsy with sleep studies several years ago while she was told [place]. I reviewed your office note from 07/10/2017. Prior sleep study results are not available for my review today. Of note, she is currently not on Ritalin since October 2018. She has been on generic Provigil. She was  supposed to start Xyrem, but moved with her husband. She has been married since 10/18. She has not been on BuSpar or Cymbalta for about a month. She smokes half a pack per day or so, she does not drink alcohol on a regular basis. She drinks caffeine about 16 ounce per day in the form of soda.  Her Epworth sleepiness score is 22 out of 24 today, fatigue score is 57 out of 63. She currently does not work. She has 2 children. She has gained weight over the past years, had lost some 80 lb after her BF died. 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 She then stopped drinking and gained about 40 lb in a year. She got married in October.  She has 2 grown sons, 79 and 58. She has 3 GC. She reports a history of sleepiness that goes back to 2015 or maybe 2014. She was having problems at work. 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 She was sleepy at work, she was  even late one day to work by an hour and did not realize it. She reports that she had fallen asleep at the wheel with her foot on the brake. She had a 15 minute commute only typically but had fallen asleep on her way while stopped in the middle lane as I understand. She has had some falling spells. She has no triggers for these. She has fallen in the shower and on the stairs. She reports that between 2014 and 2016 she had about 7-8 episodes of falling. She has had lightheadedness and dizzy spells. She has lost consciousness. She has fallen to the ground with the use without recollection and waking up with EMS being called by her husband. She has had some recent stressors including marital discord. She went to the emergency room last week because of the dizzy spell and loss of consciousness. She likely had a syncopal spell. She had blood work and an EKG in the ER. She admits that she does not drink water very much. She reports a history of narcolepsy in her paternal aunt. Her mother has episodes of  syncope. She reports that she snores loudly and her husband has taped her snoring. She wakes herself up from her snoring. Her bedtime schedule and rise time routine are currently more erratic. She sleeps better in the early morning hours. She tries to go to bed around 10.  Ms. Wilbourne is a 43 year old right-handed woman with an underlying medical history of mood disorder, suboptimally controlled diabetes, COPD, thyroid disease, smoking, hypertension, and morbid obesity, with whom I am conducting a virtual, video based follow-up appointment via Webex for follow-up consultation of her narcolepsy as well as mild sleep apnea. The patient is unaccompanied today and joins viacell phone from her car which appeared to initially be moving slowly but she reported that she was moving it out of the driveway to make room and was parked after that.I last saw her 03/07/2018, at which time she was not using her AutoPap. She was motivated to get back on it. She was complaining of left lateral thigh paresthesias. We increased her Ritalin to 20 mg twice a day at the time. I suggested we proceed with an EMG nerve conduction study in the interim. 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 We called her with her test results in the interim.  Today, 09/06/2018: Please also see below for virtual visit documentation.   I reviewed her AutoPap compliance data from the past 7 months and she has not been using her AutoPap machine with the exception of 2 days.  The patient's allergies, current medications, family history, past medical history, past social history, past surgical history and problem list were reviewed and updated as appropriate.  Previously (copied from previous notes for reference):  I saw her on 12/05/2017, at which time she was not fully compliant with her AutoPap. She was having difficulty tolerating AutoPap particularly secondary to mask issues. She  was no longer on Cymbalta or BuSpar. She was on Abilify. She was also on Depakote. Suggested starting Provigil but she called back as it was too expensive, started her on generic Ritalin.   I reviewed her AutoPap compliance data from the past 30 days from 02/03/2018 through 03/04/2018, which is a total of 30 days, during which time she used her AutoPap only 2 days, indicating noncompliance.   I was able to review a sleep study results in the interim from her previous sleep testing: Her  study results from 03/24/2015: Sleep latency was 1.7 minutes, REM latency 6.5 minutes, total sleep time was 351.4 minutes. REM percentage was 11.2 of her total sleep time. She had no significant PLMS. AHI was 2.6 per hour. Lowest oxygen saturation was 87%. She had a multiple sleep latency test on 03/25/2015. She had a UDS which was negative. The patient achieved sleep in all 5 nap opportunities with a mean sleep latency of 37 seconds. She had two REM onset naps. Study was interpreted by Dr. Tera Partridge. 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  I first met her on 07/13/2017 at the request of her primary care physician, at which time the patient reported a prior diagnosis of narcolepsy. Prior sleep study results were not available. I suggested we proceed with sleep study testing and next a nap testing. She had a baseline sleep study, followed by a next a nap study. Her baseline sleep study from 08/21/2017 showed a sleep efficiency of 81.7% and a sleep latency delayed at 44.5 minutes. REM latency was borderline at 68 minutes. She had an increased percentage of stage II sleep, absence of slow-wave sleep and REM sleep was high normal at 25.4%. Total AHI was mildly abnormal at 6.5 per hour, REM AHI was in the moderate range at 24.8 per hour, average oxygen saturation was 95%, nadir was 85%. She had no significant PLMS. Next a nap study on 08/22/2017 showed a mean sleep latency of 10.4 minutes  for 4 naps with 3 REM onset naps. She was advised that we should go ahead and treat her for her sleep apnea first with AutoPap and consider future testing with sleep study and nap study while treated for sleep apnea and also narcolepsy HLA testing. Her UDS during the MSLT was positive for Benadryl.  I reviewed her AutoPap compliance data from 11/04/2000 through 12/03/2017 which is a total of 30 days, during which time she used her AutoPap 24 days with percent used days greater than 4 hours at 27%, indicating significantly suboptimal compliance with an average usage of 3 hours and 36 minutes only, residual AHI at goal at 1.7 per hour, 95thpercentile pressure at 9.2 cm, leak on the high side for the 95thpercentile at 36.2 L/m on a pressure range of 5-13 cm. In the past 90 days, her compliance was a little bit better, in the beginning, in mid April through mid May she had a compliance for 4 hours or more at 87% which was very good.   07/13/2017: (She) reports significant daytime somnolence. She was previously diagnosed with narcolepsy with sleep studies several years ago while she was told [place]. I reviewed your office note from 07/10/2017. Prior sleep study results are not available for my review today. Of note, she is currently not on Ritalin since October 2018. She has been on generic Provigil. She was supposed to start Xyrem, but moved with her husband. She has been married since 10/18. She has not been on BuSpar or Cymbalta for about a month. She smokes half a pack per day or so, she does not drink alcohol on a regular basis. She drinks caffeine about 16 ounce per day in the form of soda.  Her Epworth sleepiness score is 22 out of 24 today, fatigue score is 57 out of 63. She currently does not work. She has 2 children. She has gained weight over the past years, had lost some 80 lb after her BF died. She has a Hx of excessive alcohol use  for several months after he died. She then stopped drinking and  gained about 40 lb in a year. She got married in October.  She has 2 grown sons, 36 and 17. She has 3 GC. She reports a history of sleepiness that goes back to 2015 or maybe 2014. She was having problems at work. 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 She was sleepy at work, she was even late one day to work by an hour and did not realize it. She reports that she had fallen asleep at the wheel with her foot on the brake. She had a 15 minute commute only typically but had fallen asleep on her way while stopped in the middle lane as I understand. She has had some falling spells. She has no triggers for these. She has fallen in the shower and on the stairs. She reports that between 2014 and 2016 she had about 7-8 episodes of falling. She has had lightheadedness and dizzy spells. She has lost consciousness. She has fallen to the ground with the use without recollection and waking up with EMS being called by her husband. She has had some recent stressors including marital discord. She went to the emergency room last week because of the dizzy spell and loss of consciousness. She likely had a syncopal spell. She had blood work and an EKG in the ER. She admits that she does not drink water very much. She reports a history of narcolepsy in her paternal aunt. Her mother has episodes of syncope. She reports that she snores loudly and her husband has taped her snoring. She wakes herself up from her snoring. Her bedtime schedule and rise time routine are currently more erratic. She sleeps better in the early morning hours. She tries to go to bed around 10.   REVIEW OF SYSTEMS: Out of a complete 14 system review of symptoms, the patient complains only of the following symptoms, and all other reviewed systems are negative.  ALLERGIES: Allergies  Allergen Reactions   Aspirin Other (See Comments)    Was told by her mother not to take it in the past but was put on it a few years ago and  did fine without any adverse effect   Tylenol [Acetaminophen]     Does not take due to fatty liver disease    HOME MEDICATIONS: Outpatient Medications Prior to Visit  Medication Sig Dispense Refill   albuterol (PROVENTIL HFA;VENTOLIN HFA) 108 (90 Base) MCG/ACT inhaler Inhale 2 puffs into the lungs every 4 (four) hours as needed for wheezing or shortness of breath. 3 Inhaler 3   amLODipine (NORVASC) 10 MG tablet Take 1 tablet (10 mg total) by mouth daily. 90 tablet 3   ARIPiprazole (ABILIFY) 10 MG tablet Take 10 mg by mouth at bedtime.     Blood Glucose Monitoring Suppl (FREESTYLE LITE) DEVI Use to check blood sugar up to 3 times a day 1 each 1   divalproex (DEPAKOTE) 250 MG DR tablet Take 250 mg by mouth 2 (two) times daily.      empagliflozin (JARDIANCE) 10 MG TABS tablet Take 10 mg by mouth daily. 90 tablet 3   exenatide (BYETTA 10 MCG PEN) 10 MCG/0.04ML SOPN injection Inject 0.04 mLs (10 mcg total) into the skin 2 (two) times daily with a meal. 45 pen 3   FLUoxetine (PROZAC) 20 MG tablet Take 20 mg by mouth daily.     gabapentin (NEURONTIN) 300 MG capsule Take 3  capsules (900 mg total) by mouth 2 (two) times daily. 540 capsule 3   glucose blood (FREESTYLE LITE) test strip Use to check blood sugar 3 times daily. diag code E11.9. insulin dependent 300 each 3   insulin aspart (NOVOLOG FLEXPEN) 100 UNIT/ML FlexPen Inject 10 Units into the skin 3 (three) times daily with meals. 45 mL 3   Insulin Detemir (LEVEMIR FLEXPEN) 100 UNIT/ML Pen Inject 40 Units into the skin daily at 10 pm. IM program 135 mL 3   Insulin Pen Needle (NOVOFINE PLUS) 32G X 4 MM MISC 1 Dose by Does not apply route 4 (four) times daily. Use with Levemir and Novolog, IM program 390 each 3   iron polysaccharides (NU-IRON) 150 MG capsule Take 1 capsule (150 mg total) by mouth daily. 30 capsule 2   Lancets (FREESTYLE) lancets Use to check blood sugar 3 times daily. diag code E11.9. insulin dependent 300 each 3    levothyroxine (SYNTHROID, LEVOTHROID) 150 MCG tablet Take 1 tablet (150 mcg total) by mouth daily before breakfast. 90 tablet 3   lisinopril (PRINIVIL,ZESTRIL) 40 MG tablet Take 1 tablet (40 mg total) by mouth daily. 90 tablet 3   lovastatin (MEVACOR) 40 MG tablet Take 1 tablet (40 mg total) by mouth at bedtime. 90 tablet 3   metFORMIN (GLUCOPHAGE) 500 MG tablet Take 2 tablets (1,000 mg total) by mouth 2 (two) times daily with a meal. 360 tablet 3   methylphenidate (RITALIN) 20 MG tablet Take 1 tablet (20 mg total) by mouth 2 (two) times daily with breakfast and lunch. Take in the morning and at lunch, no later than 3 PM. 60 tablet 0   mirtazapine (REMERON) 15 MG tablet Take 15 mg by mouth at bedtime.     montelukast (SINGULAIR) 10 MG tablet Take 1 tablet (10 mg total) by mouth at bedtime. 90 tablet 3   omeprazole (PRILOSEC) 40 MG capsule Take 1 capsule (40 mg total) by mouth 2 (two) times daily at 8 am and 10 pm. 180 capsule 3   prazosin (MINIPRESS) 1 MG capsule Take 1 mg by mouth at bedtime.     sucralfate (CARAFATE) 1 g tablet Take 1 tablet (1 g total) by mouth 4 (four) times daily -  with meals and at bedtime for 7 days. (Patient taking differently: Take 1 g by mouth daily. ) 28 tablet 0   vitamin E 400 UNIT capsule Take 1 capsule (400 Units total) by mouth daily. 30 capsule 0   No facility-administered medications prior to visit.     PAST MEDICAL HISTORY: Past Medical History:  Diagnosis Date   Atypical chest pain    a. Cath 11/2017 - R/LHC showing 30% mid LAD, otherwise no significant disease, EF 55-65%, normal LVEDP, normal heart pressures (no evidence of significant CAD or CHF).   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   Obstructive sleep apnea  09/26/2017   Recurrent syncope    Tobacco use 11/15/2017    PAST SURGICAL HISTORY: Past Surgical History:  Procedure Laterality Date   btl     CHOLECYSTECTOMY     RIGHT/LEFT HEART CATH AND CORONARY ANGIOGRAPHY N/A 12/07/2017   Procedure: RIGHT/LEFT HEART CATH AND CORONARY ANGIOGRAPHY;  Surgeon: Leonie Man, MD;  Location: [facility];  Service: Cardiovascular;  Laterality: N/A;  shoulder Bilateral     FAMILY HISTORY: Family History  Problem Relation Age of Onset   Hypertension Mother    Syncope episode Mother    Heart disease Maternal Grandmother        unclear details    SOCIAL HISTORY: Social History   Socioeconomic History   Marital status: Married    Spouse name: Not on file   Number of children: Not on file   Years of education: Not on file   Highest education level: Not on file  Occupational History   Not on file  Social Needs   Financial resource strain: Not on file   Food insecurity    Worry: Not on file    Inability: Not on file   Transportation needs    Medical: Not on file    Non-medical: Not on file  Tobacco Use   Smoking status: Former Smoker    Packs/day: 0.30    Types: Cigarettes    Quit date: 10/29/2017    Years since quitting: 1.3   Smokeless tobacco: Never Used   Tobacco comment: 1/3 per day   Substance and Sexual Activity   Alcohol use: Not Currently   Drug use: No   Sexual activity: Not on file  Lifestyle   Physical activity    Days per week: Not on file    Minutes per session: Not on file   Stress: Not on file  Relationships   Social connections    Talks on phone: Not on file    Gets together: Not on file    Attends religious service: Not on file    Active member of club or organization: Not on file    Attends meetings of clubs or organizations: Not on file    Relationship status: Not on file   Intimate partner violence    Fear of current or ex partner: Not on file    Emotionally abused: Not  on file    Physically abused: Not on file    Forced sexual activity: Not on file  Other Topics Concern   Not on file  Social History Narrative   Not on file      PHYSICAL EXAM  There were no vitals filed for this visit. There is no height or weight on file to calculate BMI.  Generalized: Well developed, in no acute distress  Cardiology: normal rate and rhythm, no murmur noted Neurological examination  Mentation: Alert oriented to time, place, history taking. Follows all commands speech and language fluent Cranial nerve II-XII: Pupils were equal round reactive to light. Extraocular movements were full, visual field were full on confrontational test. Facial sensation and strength were normal. Uvula tongue midline. Head turning and shoulder shrug  were normal and symmetric. Motor: The motor testing reveals 5 over 5 strength of all 4 extremities. Good symmetric motor tone is noted throughout.  Sensory: Sensory testing is intact to soft touch on all 4 extremities. No evidence of extinction is noted.  Coordination: Cerebellar testing reveals good finger-nose-finger and heel-to-shin bilaterally.  Gait and station: Gait is normal. Tandem gait is normal. Romberg is negative. No drift is seen.  Reflexes: Deep tendon reflexes are symmetric and normal bilaterally.   DIAGNOSTIC DATA (LABS, IMAGING, TESTING) - I reviewed patient records, labs, notes, testing and imaging myself where available.  No flowsheet data found.   Lab Results  Component Value Date   WBC 8.7 08/02/2018   HGB 10.8 (L) 08/02/2018   HCT 37.3 08/02/2018   MCV  79.0 (L) 08/02/2018   PLT 268 08/02/2018      Component Value Date/Time   NA 136 08/02/2018 0337   NA 140 12/28/2017 1451   K 3.5 08/02/2018 0337   CL 104 08/02/2018 0337   CO2 21 (L) 08/02/2018 0337   GLUCOSE 120 (H) 08/02/2018 0337   BUN 8 08/02/2018 0337   BUN 10 12/28/2017 1451   CREATININE 0.75 08/02/2018 0337   CALCIUM 8.9 08/02/2018 0337   PROT  6.9 08/07/2017 1001   ALBUMIN 4.1 08/07/2017 1001   AST 17 08/07/2017 1001   ALT 21 08/07/2017 1001   ALKPHOS 107 08/07/2017 1001   BILITOT <0.2 08/07/2017 1001   GFRNONAA >60 08/02/2018 0337   GFRAA >60 08/02/2018 0337   No results found for: CHOL, HDL, LDLCALC, LDLDIRECT, TRIG, CHOLHDL Lab Results  Component Value Date   HGBA1C 7.6 (A) 05/15/2018   Lab Results  Component Value Date   VITAMINB12 1,599 (H) 02/20/2018   Lab Results  Component Value Date   TSH 1.510 08/14/2018       ASSESSMENT AND PLAN 43 y.o. year old female  has a past medical history of Atypical chest pain, COPD (chronic obstructive pulmonary disease) ([place]), Diabetes mellitus ([place]) (04/17/2017), Fibromyalgia syndrome (04/17/2017), GERD (gastroesophageal reflux disease) (04/17/2017), Hypertension, Hypothyroidism (04/17/2017), Manic depression ([place]) (04/17/2017), Obstructive sleep apnea (09/26/2017), Recurrent syncope, and Tobacco use (11/15/2017). here with ***  No diagnosis found.     No orders of the defined types were placed in this encounter.    No orders of the defined types were placed in this encounter.     I spent 15 minutes with the patient. 50% of this time was spent counseling and educating patient on plan of care and medications.    Debbora Presto, FNP-C 03/20/2019, 7:35 AM [facility] [address] [place], [place] [address] [phone]

## 2019-04-17 ENCOUNTER — Telehealth: Payer: Self-pay | Admitting: Licensed Clinical Social Worker

## 2019-04-17 ENCOUNTER — Other Ambulatory Visit: Payer: Self-pay

## 2019-04-17 ENCOUNTER — Ambulatory Visit (INDEPENDENT_AMBULATORY_CARE_PROVIDER_SITE_OTHER): Admitting: *Deleted

## 2019-04-17 DIAGNOSIS — Z23 Encounter for immunization: Secondary | ICD-10-CM

## 2019-04-17 NOTE — Telephone Encounter (Signed)
Patient is requesting a phone call.  Please call patient back.

## 2019-04-17 NOTE — Telephone Encounter (Signed)
Pt wants appt with [facility] asap

## 2019-04-17 NOTE — Telephone Encounter (Signed)
Rtc, no answer, vmail not set up

## 2019-04-18 NOTE — Telephone Encounter (Signed)
Called patient, but she did not answer. No vm set up.

## 2019-04-20 ENCOUNTER — Ambulatory Visit (HOSPITAL_COMMUNITY)
Admission: EM | Admit: 2019-04-20 | Discharge: 2019-04-20 | Disposition: A | Discharge status: Home / Self Care | Attending: Family Medicine | Admitting: Family Medicine

## 2019-04-20 ENCOUNTER — Other Ambulatory Visit: Payer: Self-pay

## 2019-04-20 ENCOUNTER — Encounter (HOSPITAL_COMMUNITY): Payer: Self-pay

## 2019-04-20 DIAGNOSIS — N3090 Cystitis, unspecified without hematuria: Secondary | ICD-10-CM | POA: Diagnosis not present

## 2019-04-20 DIAGNOSIS — H8110 Benign paroxysmal vertigo, unspecified ear: Secondary | ICD-10-CM | POA: Diagnosis not present

## 2019-04-20 DIAGNOSIS — Z3202 Encounter for pregnancy test, result negative: Secondary | ICD-10-CM | POA: Diagnosis not present

## 2019-04-20 LAB — POC URINE PREG, ED: Preg Test, Ur: NEGATIVE

## 2019-04-20 LAB — POCT URINALYSIS DIP (DEVICE)
Bilirubin Urine: NEGATIVE
Glucose, UA: 500 mg/dL — AB
Ketones, ur: 15 mg/dL — AB
Leukocytes,Ua: NEGATIVE
Nitrite: POSITIVE — AB
Protein, ur: 100 mg/dL — AB
Specific Gravity, Urine: 1.025 (ref 1.005–1.030)
Urobilinogen, UA: 0.2 mg/dL (ref 0.0–1.0)
pH: 5.5 (ref 5.0–8.0)

## 2019-04-20 LAB — POCT PREGNANCY, URINE: Preg Test, Ur: NEGATIVE

## 2019-04-20 LAB — GLUCOSE, CAPILLARY: Glucose-Capillary: 339 mg/dL — ABNORMAL HIGH (ref 70–99)

## 2019-04-20 LAB — CBG MONITORING, ED: Glucose-Capillary: 339 mg/dL — ABNORMAL HIGH (ref 70–99)

## 2019-04-20 MED ORDER — CEPHALEXIN 500 MG PO CAPS: 500.0000 mg | ORAL_CAPSULE | Freq: Two times a day (BID) | ORAL | 0 refills | Status: DC

## 2019-04-20 MED ORDER — ONDANSETRON 4 MG PO TBDP: 4.0000 mg | ORAL_TABLET | Freq: Three times a day (TID) | ORAL | 0 refills | Status: DC | PRN

## 2019-04-20 MED ORDER — MECLIZINE HCL 25 MG PO TABS: 25.0000 mg | ORAL_TABLET | Freq: Three times a day (TID) | ORAL | 0 refills | Status: DC | PRN

## 2019-04-20 NOTE — ED Triage Notes (Signed)
Pt states she has been light headed and dizzy since yesterday.  This happens when she is standing mostly.

## 2019-04-22 LAB — URINE CULTURE: Culture: >=100000 — AB

## 2019-04-22 NOTE — ED Provider Notes (Signed)
[facility]   [id number] 04/20/19 Arrival Time: 1529  ASSESSMENT & PLAN:  1. Benign paroxysmal positional vertigo, unspecified laterality   2. Cystitis     Normal neurologic exam. No suspicion for ICH or SAH. No indication for neurodiagnostic imaging at this time. Discussed.  U/A shows evidence of cystitis. Question if this triggered her vertigo. Will do her best to stay hydrated. Discussed.  To begin: Meds ordered this encounter  Medications  . ondansetron (ZOFRAN-ODT) 4 MG disintegrating tablet    Sig: Take 1 tablet (4 mg total) by mouth every 8 (eight) hours as needed for nausea or vomiting.    Dispense:  15 tablet    Refill:  0  . meclizine (ANTIVERT) 25 MG tablet    Sig: Take 1 tablet (25 mg total) by mouth 3 (three) times daily as needed for dizziness.    Dispense:  30 tablet    Refill:  0  . cephALEXin (KEFLEX) 500 MG capsule    Sig: Take 1 capsule (500 mg total) by mouth 2 (two) times daily.    Dispense:  10 capsule    Refill:  0    Reassured that these symptoms do not appear to represent a serious or threatening condition. This is generally a self-limited temporary but uncomfortable situation. Rest, avoid potentially dangerous activities (such as driving or working with machinery or at heights). Use Meclizine prn. Will proceed to the ED if she develops other symptoms such as alterations of speech, swallowing, vision, motor/sensory systems, or if dizziness worsens  Urine culture sent. To watch blood sugars closely. She agrees.  Reviewed expectations re: course of current medical issues. Questions answered. Outlined signs and symptoms indicating need for more acute intervention. Patient verbalized understanding. After Visit Summary given.   SUBJECTIVE:  Karen [name] is a 43 y.o. female who reports abrupt onset of dizziness described as vertigo. Current symptoms first noted yesterday and have waxed and waned. Experiencing symptoms sporadically with  episodes typically lasting a few seconds; always associated with head movement and resolve if she can stay still. Aggravating factors: head movements and bending. Denies gait problems, headaches, memory problems, paresthesia, seizures, speech problems, tremors and weakness as well as aural pressure, otalgia, otorrhea, tinnitus and hearing loss. Recent infections: none. Head trauma: denied. Noise exposure: none. No associated SOB, CP, or palpatations reported. Recent travel: none. Reports normal bowel/bladder habits. No dysuria or urinary frequency reported. Questions slight odor to urine. Previous workup/treatments: none. H/O similar: yes; many years ago and was self-limited. Therapies tried thus far: none.  Social History   Substance and Sexual Activity  Alcohol Use Not Currently   Social History   Tobacco Use  Smoking Status Former Smoker  . Packs/day: 0.30  . Types: Cigarettes  . Quit date: 10/29/2017  . Years since quitting: 1.4  Smokeless Tobacco Never Used  Tobacco Comment   1/3 per day    Denies illegal drug use.  ROS: As per HPI. All other systems negative.    OBJECTIVE:  Vitals:   04/20/19 1604 04/20/19 1605  BP:  138/87  Pulse:  100  Resp:  18  Temp:  99 F (37.2 C)  TempSrc:  Oral  SpO2:  100%  Weight: 117.9 kg     General appearance: alert; no distress Eyes: PERRLA; EOMI; conjunctiva normal HENT: normocephalic; atraumatic; TMs normal; nasal mucosa normal; oral mucosa normal Neck: supple with FROM Lungs: clear to auscultation bilaterally Heart: regular rate and rhythm Abdomen: soft, non-tender; bowel sounds normal Extremities:  no cyanosis or edema; symmetrical with no gross deformities Skin: warm and dry Neurologic: normal gait; DTR's normal and symmetric; CN 2-12 grossly intact Psychological: alert and cooperative; normal mood and affect  Investigations: Results for orders placed or performed during the hospital encounter of 04/20/19   CBG MONITORING,  ED - Abnormal; Notable for the following components:   Glucose-Capillary 339 (*)    All other components within normal limits  POCT URINALYSIS DIP (DEVICE) - Abnormal; Notable for the following components:   Glucose, UA 500 (*)    Ketones, ur 15 (*)    Hgb urine dipstick LARGE (*)    Protein, ur 100 (*)    Nitrite POSITIVE (*)    All other components within normal limits  POC URINE PREG, ED  POCT PREGNANCY, URINE   No results found.  Allergies  Allergen Reactions  . Aspirin Other (See Comments)    Was told by her mother not to take it in the past but was put on it a few years ago and did fine without any adverse effect  . Tylenol [Acetaminophen]     Does not take due to fatty liver disease    Past Medical History:  Diagnosis Date  . Atypical chest pain    a. Cath 11/2017 - R/LHC showing 30% mid LAD, otherwise no significant disease, EF 55-65%, normal LVEDP, normal heart pressures (no evidence of significant CAD or CHF).  . COPD (chronic obstructive pulmonary disease) ([place])   . Diabetes mellitus ([place]) 04/17/2017  . Fibromyalgia syndrome 04/17/2017  . GERD (gastroesophageal reflux disease) 04/17/2017  . Hypertension   . Hypothyroidism 04/17/2017  . 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 Obstructive sleep apnea 09/26/2017  . Recurrent syncope   . Tobacco use 11/15/2017   Social History   Socioeconomic History  . Marital status: Married    Spouse name: Not on file  . Number of children: Not on file  . Years of education: Not on file  . Highest education level: Not on file  Occupational History  . Not on file  Social Needs  . Financial resource strain: Not on file  . Food insecurity    Worry: Not on file    Inability: Not on file  . Transportation needs    Medical: Not on file    Non-medical: Not on file  Tobacco Use  . Smoking status: Former Smoker    Packs/day: 0.30    Types: Cigarettes    Quit date: 10/29/2017    Years since quitting: 1.4  . Smokeless tobacco:  Never Used  . Tobacco comment: 1/3 per day   Substance and Sexual Activity  . Alcohol use: Not Currently  . Drug use: No  . Sexual activity: Not on file  Lifestyle  . Physical activity    Days per week: Not on file    Minutes per session: Not on file  . Stress: Not on file  Relationships  . Social Herbalist on phone: Not on file    Gets together: Not on file    Attends religious service: Not on file    Active member of club or organization: Not on file    Attends meetings of clubs or organizations: Not on file    Relationship status: Not on file  . Intimate partner violence    Fear of current or ex partner: Not on file    Emotionally abused: Not on file    Physically abused: Not on file  Forced sexual activity: Not on file  Other Topics Concern  . Not on file  Social History Narrative  . Not on file   Family History  Problem Relation Age of Onset  . Hypertension Mother   . Syncope episode Mother   . Heart disease Maternal Grandmother        unclear details   Past Surgical History:  Procedure Laterality Date  . btl    . CHOLECYSTECTOMY    . RIGHT/LEFT HEART CATH AND CORONARY ANGIOGRAPHY N/A 12/07/2017   Procedure: RIGHT/LEFT HEART CATH AND CORONARY ANGIOGRAPHY;  Surgeon: Marykay Lex, MD;  Location: [facility] INVASIVE CV LAB;  Service: Cardiovascular;  Laterality: N/A;  . shoulder Bilateral       Mardella Layman, MD 04/22/19 [phone]

## 2019-04-29 ENCOUNTER — Encounter: Payer: Self-pay | Admitting: Radiation Oncology

## 2019-04-29 ENCOUNTER — Encounter: Payer: Self-pay | Admitting: *Deleted

## 2019-05-21 ENCOUNTER — Encounter: Admitting: Radiation Oncology

## 2019-05-27 LAB — HM DIABETES EYE EXAM

## 2019-06-03 ENCOUNTER — Encounter: Payer: Self-pay | Admitting: *Deleted

## 2019-06-04 ENCOUNTER — Other Ambulatory Visit (HOSPITAL_COMMUNITY)
Admission: RE | Admit: 2019-06-04 | Discharge: 2019-06-04 | Disposition: A | Discharge status: Home / Self Care | Source: Ambulatory Visit | Attending: Internal Medicine | Admitting: Internal Medicine

## 2019-06-04 ENCOUNTER — Ambulatory Visit (INDEPENDENT_AMBULATORY_CARE_PROVIDER_SITE_OTHER): Admitting: Radiation Oncology

## 2019-06-04 ENCOUNTER — Encounter: Payer: Self-pay | Admitting: Radiation Oncology

## 2019-06-04 ENCOUNTER — Other Ambulatory Visit: Payer: Self-pay

## 2019-06-04 VITALS — BP 118/74 | HR 74 | Temp 98.6°F | Ht 67.0 in | Wt 250.0 lb

## 2019-06-04 DIAGNOSIS — E039 Hypothyroidism, unspecified: Secondary | ICD-10-CM

## 2019-06-04 DIAGNOSIS — E1159 Type 2 diabetes mellitus with other circulatory complications: Secondary | ICD-10-CM

## 2019-06-04 DIAGNOSIS — Z0000 Encounter for general adult medical examination without abnormal findings: Secondary | ICD-10-CM | POA: Insufficient documentation

## 2019-06-04 DIAGNOSIS — Z794 Long term (current) use of insulin: Secondary | ICD-10-CM | POA: Diagnosis not present

## 2019-06-04 DIAGNOSIS — I10 Essential (primary) hypertension: Secondary | ICD-10-CM | POA: Diagnosis not present

## 2019-06-04 DIAGNOSIS — Z6839 Body mass index (BMI) 39.0-39.9, adult: Secondary | ICD-10-CM

## 2019-06-04 DIAGNOSIS — Z79890 Hormone replacement therapy (postmenopausal): Secondary | ICD-10-CM

## 2019-06-04 DIAGNOSIS — F319 Bipolar disorder, unspecified: Secondary | ICD-10-CM

## 2019-06-04 DIAGNOSIS — E1142 Type 2 diabetes mellitus with diabetic polyneuropathy: Secondary | ICD-10-CM

## 2019-06-04 DIAGNOSIS — F310 Bipolar disorder, current episode hypomanic: Secondary | ICD-10-CM

## 2019-06-04 DIAGNOSIS — F17201 Nicotine dependence, unspecified, in remission: Secondary | ICD-10-CM

## 2019-06-04 DIAGNOSIS — Z79899 Other long term (current) drug therapy: Secondary | ICD-10-CM

## 2019-06-04 DIAGNOSIS — L0232 Furuncle of buttock: Secondary | ICD-10-CM | POA: Diagnosis not present

## 2019-06-04 DIAGNOSIS — Z720 Tobacco use: Secondary | ICD-10-CM

## 2019-06-04 LAB — POCT GLYCOSYLATED HEMOGLOBIN (HGB A1C): Hemoglobin A1C: 11.8 % — AB (ref 4.0–5.6)

## 2019-06-04 LAB — GLUCOSE, CAPILLARY: Glucose-Capillary: 209 mg/dL — ABNORMAL HIGH (ref 70–99)

## 2019-06-04 NOTE — Assessment & Plan Note (Addendum)
Patient with history of manic depression on aripiprazole 10 mg daily, fluoxetine 20 mg daily and mirtazapine 15 mg daily. She is adherent to these medications but reports continued depression. She denies SI and HI. She requests an appointment with Phineas Semen.  Patient's depression is uncontrolled.  Plan: -continue medications as above -fu appointment with Phineas Semen -fu in 3 months with physician

## 2019-06-04 NOTE — Assessment & Plan Note (Addendum)
Today's Vitals   06/04/19 1343  BP: 118/74  Pulse: 74  Temp: 98.6 F (37 C)  TempSrc: Oral  SpO2: 99%  Weight: 250 lb (113.4 kg)  Height: 5\' 7"  (1.702 m)  PainSc: 7    Body mass index is 39.16 kg/m.  Patient on amlodipine 10 mg daily, lisinopril 40 mg daily and prazosin 1 mg daily at bedtime.  Patient reports adherence to medication regimen.  Hypertension under control.  Plan: -continue medicines as above -fu in 3 months

## 2019-06-04 NOTE — Progress Notes (Signed)
   CC: diabetes follow up  HPI:  [name] is a 44 y.o. F here for follow up of her chronic medical conditions. Please see A&P for full HPI.  Past Medical History:  Diagnosis Date  . Atypical chest pain    a. Cath 11/2017 - R/LHC showing 30% mid LAD, otherwise no significant disease, EF 55-65%, normal LVEDP, normal heart pressures (no evidence of significant CAD or CHF).  . COPD (chronic obstructive pulmonary disease) ([facility])   . Diabetes mellitus ([facility]) 04/17/2017  . Fibromyalgia syndrome 04/17/2017  . GERD (gastroesophageal reflux disease) 04/17/2017  . Hypertension   . Hypothyroidism 04/17/2017  . 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 Obstructive sleep apnea 09/26/2017  . Recurrent syncope   . Tobacco use 11/15/2017   Review of Systems:  Please see A&P for full ROS.  Physical Exam:  Vitals:   06/04/19 1343  BP: 118/74  Pulse: 74  Temp: 98.6 F (37 C)  TempSrc: Oral  SpO2: 99%  Weight: 250 lb (113.4 kg)  Height: 5\' 7"  (1.702 m)   Physical Exam  Constitutional: She is well-developed, well-nourished, and in no distress. No distress.  HENT:  Head: Normocephalic and atraumatic.  Cardiovascular: Normal rate, regular rhythm and normal heart sounds.  No murmur heard. No LE edema  Pulmonary/Chest: Effort normal and breath sounds normal. No respiratory distress.  Abdominal: Soft. Bowel sounds are normal. She exhibits no distension.  Musculoskeletal:        General: Normal range of motion.     Cervical back: Normal range of motion.  Neurological: She is alert.  Skin: Skin is warm and dry. She is not diaphoretic. No erythema.  Psychiatric:  Depressed mood    Assessment & Plan:   See Encounters Tab for problem based charting.  Patient discussed with Dr. 

## 2019-06-04 NOTE — Assessment & Plan Note (Addendum)
Patient reports quitting smoking.   Patient counseled on importance of pap smear today and after initially declining agrees to Pap.  Patient reports recent eye exam and will obtain foot exam today.  Patient declines HIV screening today.  Plan: -fu in 3 mo

## 2019-06-04 NOTE — Assessment & Plan Note (Addendum)
TSH 1.510 9 mo ago. Patient on synthroid 150 mcg daily before breakfast. She is adherent to her medication regimen. She denies weight gain or constipation. She reports chronic fatigue due to narcolepsy.   Hypothyroidism under control.  Plan: -continue synthroid as above -fu in 3 mo

## 2019-06-04 NOTE — Assessment & Plan Note (Addendum)
05/15/2018 POC A1c of 7.6. POC A1c today of 11.8 in setting of not taking her medications as she ran out and was unable to get refills as she was homeless and did not have a permanent address.  Patient on jardiance 10 mg daily, exenatide 10 mcg BID w/ meals, novolog 10 units TID w/ meals and metformin 1000 mg BID w/ meals. She recently received her medications. We discussed the importance of taking these medications regularly and if she ever has issues obtaining them to let us know and we would try to work something out.  Eye exam completed a few weeks ago.  Foot exam completed today.  Discussed importance of exercise. She will try to walk 10 minutes every few days.  Diabetes uncontrolled secondary to medication nonadherence due to homelessness.  Plan: -continue meds as above -exercise -follow up in 3 month for A1c recheck

## 2019-06-04 NOTE — Assessment & Plan Note (Addendum)
Patient reports quitting smoking back in May of 2020. Congratulated patient on this difficult task.

## 2019-06-04 NOTE — Assessment & Plan Note (Addendum)
Filed Weights   06/04/19 1343  Weight: 250 lb (113.4 kg)     Discussed weight with patient. She reports some weight loss due to stress. Encouraged diet and exercise although we discussed these can be hard to focus on when struggling with obtaining permanent housing. Patient plans to walk 10 minutes a few times per week.  Plan: -Fu in 3 month for weight check

## 2019-06-04 NOTE — Patient Instructions (Addendum)
Thank you for coming to your appointment. It was so nice to see you. Today we discussed  Diagnoses and all orders for this visit:  Boil: -use ointment once daily for 7 days -twice daily warm baths  -warm compresses -if unimproved or worsens, call us back  Hypertension: -blood pressure is perfect; keep up the good work  Type 2 diabetes: -continue taking your medicines and please let us know if you ever have an issue getting them; we will work something out! -try and walk 10 minutes at a time a few times a week -recent eye exam -foot exam today -keep up the good work  Hypothyroidism: -continue taking your medicine   Depression: -follow up visit with Phineas Semen  Tobacco use: -well done on quitting smoking! That is a huge accomplishment!  Health maintenance: -pap smear today; will call if any results are abnormal  Please keep up the good work. I look forward to seeing you again soon at your follow up in 3 months.  Sincerely,  Jenell Milliner, MD

## 2019-06-04 NOTE — Assessment & Plan Note (Signed)
Patient reports boil on right buttock. She denies infectious sx. Does report pain and warmth. On exam there is a 5 mm x 5 mm area of skin breakdown with surrounding erythema. There is induration but no fluctuance amenable to I&D.   Plan: -given uncontrolled diabetes, proceed with 7 days of bacitracin, provided in clinic -continue warm baths BID -continue warm compresses -call if sx worsen or fail to improve

## 2019-06-06 NOTE — Progress Notes (Signed)
Internal Medicine Clinic Attending  Case discussed with Dr. Lanierat the time of the visit.  We reviewed the resident's history and exam and pertinent patient test results.  I agree with the assessment, diagnosis, and plan of care documented in the resident's note.   

## 2019-06-08 ENCOUNTER — Other Ambulatory Visit: Payer: Self-pay

## 2019-06-08 ENCOUNTER — Encounter (HOSPITAL_COMMUNITY): Payer: Self-pay | Admitting: *Deleted

## 2019-06-08 ENCOUNTER — Ambulatory Visit (HOSPITAL_COMMUNITY)
Admission: EM | Admit: 2019-06-08 | Discharge: 2019-06-08 | Disposition: A | Discharge status: Home / Self Care | Attending: Emergency Medicine | Admitting: Emergency Medicine

## 2019-06-08 DIAGNOSIS — Z791 Long term (current) use of non-steroidal anti-inflammatories (NSAID): Secondary | ICD-10-CM | POA: Diagnosis not present

## 2019-06-08 DIAGNOSIS — Z79899 Other long term (current) drug therapy: Secondary | ICD-10-CM | POA: Diagnosis not present

## 2019-06-08 DIAGNOSIS — J069 Acute upper respiratory infection, unspecified: Secondary | ICD-10-CM | POA: Diagnosis not present

## 2019-06-08 DIAGNOSIS — Z794 Long term (current) use of insulin: Secondary | ICD-10-CM | POA: Insufficient documentation

## 2019-06-08 DIAGNOSIS — Z886 Allergy status to analgesic agent status: Secondary | ICD-10-CM | POA: Diagnosis not present

## 2019-06-08 DIAGNOSIS — L0231 Cutaneous abscess of buttock: Secondary | ICD-10-CM | POA: Insufficient documentation

## 2019-06-08 DIAGNOSIS — Z9049 Acquired absence of other specified parts of digestive tract: Secondary | ICD-10-CM | POA: Insufficient documentation

## 2019-06-08 DIAGNOSIS — Z87891 Personal history of nicotine dependence: Secondary | ICD-10-CM | POA: Diagnosis not present

## 2019-06-08 DIAGNOSIS — Z8249 Family history of ischemic heart disease and other diseases of the circulatory system: Secondary | ICD-10-CM | POA: Diagnosis not present

## 2019-06-08 DIAGNOSIS — J449 Chronic obstructive pulmonary disease, unspecified: Secondary | ICD-10-CM | POA: Diagnosis not present

## 2019-06-08 DIAGNOSIS — I110 Hypertensive heart disease with heart failure: Secondary | ICD-10-CM | POA: Insufficient documentation

## 2019-06-08 DIAGNOSIS — F319 Bipolar disorder, unspecified: Secondary | ICD-10-CM | POA: Diagnosis not present

## 2019-06-08 DIAGNOSIS — E039 Hypothyroidism, unspecified: Secondary | ICD-10-CM | POA: Diagnosis not present

## 2019-06-08 DIAGNOSIS — D509 Iron deficiency anemia, unspecified: Secondary | ICD-10-CM | POA: Diagnosis not present

## 2019-06-08 DIAGNOSIS — M797 Fibromyalgia: Secondary | ICD-10-CM | POA: Diagnosis not present

## 2019-06-08 DIAGNOSIS — I509 Heart failure, unspecified: Secondary | ICD-10-CM | POA: Insufficient documentation

## 2019-06-08 DIAGNOSIS — E1159 Type 2 diabetes mellitus with other circulatory complications: Secondary | ICD-10-CM | POA: Diagnosis not present

## 2019-06-08 DIAGNOSIS — G4733 Obstructive sleep apnea (adult) (pediatric): Secondary | ICD-10-CM | POA: Insufficient documentation

## 2019-06-08 DIAGNOSIS — Z79890 Hormone replacement therapy (postmenopausal): Secondary | ICD-10-CM | POA: Insufficient documentation

## 2019-06-08 DIAGNOSIS — R05 Cough: Secondary | ICD-10-CM | POA: Diagnosis not present

## 2019-06-08 DIAGNOSIS — Z20822 Contact with and (suspected) exposure to covid-19: Secondary | ICD-10-CM | POA: Diagnosis not present

## 2019-06-08 DIAGNOSIS — K219 Gastro-esophageal reflux disease without esophagitis: Secondary | ICD-10-CM | POA: Diagnosis not present

## 2019-06-08 LAB — POC SARS CORONAVIRUS 2 AG: SARS Coronavirus 2 Ag: NEGATIVE

## 2019-06-08 LAB — POC SARS CORONAVIRUS 2 AG -  ED: SARS Coronavirus 2 Ag: NEGATIVE

## 2019-06-08 MED ORDER — CETIRIZINE HCL 10 MG PO CAPS: 10.0000 mg | ORAL_CAPSULE | Freq: Every day | ORAL | 0 refills | Status: DC

## 2019-06-08 MED ORDER — AMOXICILLIN-POT CLAVULANATE 875-125 MG PO TABS: 1.0000 | ORAL_TABLET | Freq: Two times a day (BID) | ORAL | 0 refills | Status: AC

## 2019-06-08 MED ORDER — FLUTICASONE PROPIONATE 50 MCG/ACT NA SUSP: 1.0000 | Freq: Every day | NASAL | 0 refills | Status: DC

## 2019-06-08 MED ORDER — NAPROXEN 500 MG PO TABS: 500.0000 mg | ORAL_TABLET | Freq: Two times a day (BID) | ORAL | 0 refills | Status: DC

## 2019-06-08 NOTE — Discharge Instructions (Signed)
Abscess Please begin Augmentin twice daily for 1 week  Remove packing in 24-48 hours or return here for packing removal/recheck Mon Am  Apply warm compresses/hot rags to area with massage to express further drainage especially the first 24-48 hours  Return if symptoms returning or not improving  URI Rest Fluids COVID swab pending, quarantine until results return Begin daily cetirizine to help with congestion Ibuprofen or naprosyn for body aches, sinus pressure and and fevers Follow up if symptoms not improving or worsening

## 2019-06-08 NOTE — ED Triage Notes (Signed)
C/O right buttock abscess x 6 days; saw PCP 4 days ago - was given bacitracin topically; no oral abx.  Since then, abscess increasing in size and becoming more painful. Started with fever, body aches, congestion, runny nose and malaise 2 days ago.  States fevers up to 100.1.  Denies vomiting.

## 2019-06-09 LAB — NOVEL CORONAVIRUS, NAA (HOSP ORDER, SEND-OUT TO REF LAB; TAT 18-24 HRS): SARS-CoV-2, NAA: NOT DETECTED

## 2019-06-09 NOTE — ED Provider Notes (Signed)
[facility]    CSN: [id number] Arrival date & time: 06/08/19  1205      History   Chief Complaint Chief Complaint  Patient presents with  . Abscess  . Fever    HPI Karen [name] is a 44 y.o. female history of hypertension, GERD, DM type II, COPD, presenting today for evaluation of abscess as well as URI symptoms.  Patient states that over the past 2 days she has had nasal congestion, body aches, fevers up to 100.1 as well as fatigue.  She denies any significant cough, chest pain or shortness of breath.  Denies any GI upset of nausea vomiting or diarrhea.  Denies any close sick contacts.  Denies any known exposure to Covid.   She also notes that she has an abscess to her right buttocks.  This has been there for approximately 1 week.  Initially started small, was seen by PCP 4 days ago and was given bacitracin to apply topically.  She notes that it has been oozing blood and pus.  It has been very sensitive.  Denies rectal pain.  Denies lightheadedness or dizziness.  HPI  Past Medical History:  Diagnosis Date  . Atypical chest pain    a. Cath 11/2017 - R/LHC showing 30% mid LAD, otherwise no significant disease, EF 55-65%, normal LVEDP, normal heart pressures (no evidence of significant CAD or CHF).  . COPD (chronic obstructive pulmonary disease) (HCC)   . Diabetes mellitus (HCC) 04/17/2017  . Fibromyalgia syndrome 04/17/2017  . GERD (gastroesophageal reflux disease) 04/17/2017  . Hypertension   . Hypothyroidism 04/17/2017  . 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 Obstructive sleep apnea 09/26/2017  . Recurrent syncope   . Tobacco use 11/15/2017    Patient Active Problem List   Diagnosis Date Noted  . Boil of buttock 06/04/2019  . Healthcare maintenance 06/04/2019  . 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 Dizziness 08/14/2018  . Iron deficiency anemia 08/14/2018  . Morbid obesity (HCC) 05/15/2018  . 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 Atypical chest pain   .  Dysuria 11/15/2017  . (HFpEF) heart failure with preserved ejection fraction (HCC) 11/14/2017  . Obstructive sleep apnea 09/26/2017  . Hypothyroidism 04/17/2017  . Diabetes mellitus (HCC) 04/17/2017  . Hypertension associated with diabetes (HCC) 04/17/2017  . COPD (chronic obstructive pulmonary disease) (HCC) 04/17/2017  . GERD (gastroesophageal reflux disease) 04/17/2017  . 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 Fibromyalgia syndrome 04/17/2017    Past Surgical History:  Procedure Laterality Date  . btl    . CHOLECYSTECTOMY    . RIGHT/LEFT HEART CATH AND CORONARY ANGIOGRAPHY N/A 12/07/2017   Procedure: RIGHT/LEFT HEART CATH AND CORONARY ANGIOGRAPHY;  Surgeon: Marykay Lex, MD;  Location: [facility] INVASIVE CV LAB;  Service: Cardiovascular;  Laterality: N/A;  . shoulder Bilateral     OB History   No obstetric history on file.      Home Medications    Prior to Admission medications   Medication Sig Start Date End Date Taking? Authorizing Provider  amLODipine (NORVASC) 10 MG tablet Take 1 tablet (10 mg total) by mouth daily. 07/13/18  Yes Eulah Pont, MD  ARIPiprazole (ABILIFY) 10 MG tablet Take 10 mg by mouth at bedtime.   Yes [provider]  empagliflozin (JARDIANCE) 10 MG TABS tablet Take 10 mg by mouth daily. 07/13/18  Yes Eulah Pont, MD  exenatide (BYETTA 10 MCG PEN) 10 MCG/0.04ML SOPN injection Inject 0.04 mLs (10 mcg total) into the skin 2 (two) times daily with a  meal. 07/13/18  Yes Ledell Noss, MD  gabapentin (NEURONTIN) 300 MG capsule Take 3 capsules (900 mg total) by mouth 2 (two) times daily. 07/13/18  Yes Ledell Noss, MD  insulin aspart (NOVOLOG FLEXPEN) 100 UNIT/ML FlexPen Inject 10 Units into the skin 3 (three) times daily with meals. 07/13/18  Yes Ledell Noss, MD  Insulin Detemir (LEVEMIR FLEXPEN) 100 UNIT/ML Pen Inject 40 Units into the skin daily at 10 pm. IM program 07/13/18  Yes Ledell Noss, MD  levothyroxine (SYNTHROID, LEVOTHROID) 150 MCG tablet Take 1 tablet (150  mcg total) by mouth daily before breakfast. 07/13/18  Yes Ledell Noss, MD  lisinopril (PRINIVIL,ZESTRIL) 40 MG tablet Take 1 tablet (40 mg total) by mouth daily. 07/13/18  Yes Ledell Noss, MD  lovastatin (MEVACOR) 40 MG tablet Take 1 tablet (40 mg total) by mouth at bedtime. 07/13/18  Yes Ledell Noss, MD  metFORMIN (GLUCOPHAGE) 500 MG tablet Take 2 tablets (1,000 mg total) by mouth 2 (two) times daily with a meal. 07/13/18  Yes Ledell Noss, MD  methylphenidate (RITALIN) 20 MG tablet Take 1 tablet (20 mg total) by mouth 2 (two) times daily with breakfast and lunch. Take in the morning and at lunch, no later than 3 PM. 12/11/18  Yes Star Age, MD  montelukast (SINGULAIR) 10 MG tablet Take 1 tablet (10 mg total) by mouth at bedtime. 07/13/18  Yes Ledell Noss, MD  omeprazole (PRILOSEC) 40 MG capsule Take 1 capsule (40 mg total) by mouth 2 (two) times daily at 8 am and 10 pm. 07/13/18  Yes Ledell Noss, MD  sucralfate (CARAFATE) 1 g tablet Take 1 tablet (1 g total) by mouth 4 (four) times daily -  with meals and at bedtime for 7 days. Patient taking differently: Take 1 g by mouth daily.  08/02/17 08/02/27 Yes Murray, Alyssa B, PA-C  albuterol (PROVENTIL HFA;VENTOLIN HFA) 108 (90 Base) MCG/ACT inhaler Inhale 2 puffs into the lungs every 4 (four) hours as needed for wheezing or shortness of breath. 07/13/18   Ledell Noss, MD  amoxicillin-clavulanate (AUGMENTIN) 875-125 MG tablet Take 1 tablet by mouth every 12 (twelve) hours for 7 days. 06/08/19 06/15/19  Jenniferlynn Saad C, PA-C  Blood Glucose Monitoring Suppl (FREESTYLE LITE) DEVI Use to check blood sugar up to 3 times a day 09/26/17   Ledell Noss, MD  cephALEXin (KEFLEX) 500 MG capsule Take 1 capsule (500 mg total) by mouth 2 (two) times daily. 04/20/19   Vanessa Kick, MD  Cetirizine HCl 10 MG CAPS Take 1 capsule (10 mg total) by mouth daily for 10 days. 06/08/19 06/18/19  Breken Nazari C, PA-C  divalproex (DEPAKOTE) 250 MG DR tablet Take 250 mg by mouth 2 (two) times daily.      [provider]  FLUoxetine (PROZAC) 20 MG tablet Take 20 mg by mouth daily.    [provider]  fluticasone (FLONASE) 50 MCG/ACT nasal spray Place 1-2 sprays into both nostrils daily for 7 days. 06/08/19 06/15/19  Kc Summerson C, PA-C  glucose blood (FREESTYLE LITE) test strip Use to check blood sugar 3 times daily. diag code E11.9. insulin dependent 07/13/18   Ledell Noss, MD  Insulin Pen Needle (NOVOFINE PLUS) 32G X 4 MM MISC 1 Dose by Does not apply route 4 (four) times daily. Use with Levemir and Novolog, IM program 07/13/18   Ledell Noss, MD  iron polysaccharides (NU-IRON) 150 MG capsule Take 1 capsule (150 mg total) by mouth daily. 08/15/18   Seawell, Jaimie A, DO  Lancets (  FREESTYLE) lancets Use to check blood sugar 3 times daily. diag code E11.9. insulin dependent 07/13/18   Eulah PontBlum, Nina, MD  meclizine (ANTIVERT) 25 MG tablet Take 1 tablet (25 mg total) by mouth 3 (three) times daily as needed for dizziness. 04/20/19   Mardella LaymanHagler, Brian, MD  mirtazapine (REMERON) 15 MG tablet Take 15 mg by mouth at bedtime.    [provider]  naproxen (NAPROSYN) 500 MG tablet Take 1 tablet (500 mg total) by mouth 2 (two) times daily. 06/08/19   Lyden Redner C, PA-C  ondansetron (ZOFRAN-ODT) 4 MG disintegrating tablet Take 1 tablet (4 mg total) by mouth every 8 (eight) hours as needed for nausea or vomiting. 04/20/19   Mardella LaymanHagler, Brian, MD  prazosin (MINIPRESS) 1 MG capsule Take 1 mg by mouth at bedtime.    [provider]  vitamin E 400 UNIT capsule Take 1 capsule (400 Units total) by mouth daily. 03/15/17   Gerhard MunchLockwood, Robert, MD    Family History Family History  Problem Relation Age of Onset  . Hypertension Mother   . Syncope episode Mother   . Heart disease Maternal Grandmother        unclear details    Social History Social History   Tobacco Use  . Smoking status: Former Smoker    Packs/day: 0.30    Types: Cigarettes    Quit date: 10/29/2017    Years since quitting: 1.6   . Smokeless tobacco: Never Used  . Tobacco comment: 1/3 per day   Substance Use Topics  . Alcohol use: Yes    Comment: rarely  . Drug use: No     Allergies   Aspirin and Tylenol [acetaminophen]   Review of Systems Review of Systems  Constitutional: Positive for appetite change, chills, fatigue and fever. Negative for activity change.  HENT: Positive for congestion and rhinorrhea. Negative for ear pain, sinus pressure, sore throat and trouble swallowing.   Eyes: Negative for discharge and redness.  Respiratory: Negative for cough, chest tightness and shortness of breath.   Cardiovascular: Negative for chest pain.  Gastrointestinal: Negative for abdominal pain, diarrhea, nausea and vomiting.  Musculoskeletal: Positive for myalgias.  Skin: Positive for color change and wound. Negative for rash.  Neurological: Negative for dizziness, light-headedness and headaches.     Physical Exam Triage Vital Signs ED Triage Vitals  Enc Vitals Group     BP 06/08/19 1312 117/78     Pulse Rate 06/08/19 1312 (!) 101     Resp 06/08/19 1312 18     Temp 06/08/19 1312 98.4 F (36.9 C)     Temp Source 06/08/19 1312 Oral     SpO2 06/08/19 1312 100 %     Weight --      Height --      Head Circumference --      Peak Flow --      Pain Score 06/08/19 1314 7     Pain Loc --      Pain Edu? --      Excl. in GC? --    No data found.  Updated Vital Signs BP 117/78   Pulse (!) 101   Temp 98.4 F (36.9 C) (Oral)   Resp 18   LMP 05/27/2019 (Approximate)   SpO2 100%   Visual Acuity Right Eye Distance:   Left Eye Distance:   Bilateral Distance:    Right Eye Near:   Left Eye Near:    Bilateral Near:     Physical Exam Vitals and  nursing note reviewed.  Constitutional:      General: She is not in acute distress.    Appearance: She is well-developed.  HENT:     Head: Normocephalic and atraumatic.     Ears:     Comments: Bilateral ears without tenderness to palpation of external  auricle, tragus and mastoid, EAC's without erythema or swelling, TM's with good bony landmarks and cone of light. Non erythematous.    Mouth/Throat:     Comments: Oral mucosa pink and moist, no tonsillar enlargement or exudate. Posterior pharynx patent and nonerythematous, no uvula deviation or swelling. Normal phonation. Eyes:     Conjunctiva/sclera: Conjunctivae normal.  Cardiovascular:     Rate and Rhythm: Normal rate and regular rhythm.     Heart sounds: No murmur.  Pulmonary:     Effort: Pulmonary effort is normal. No respiratory distress.     Breath sounds: Normal breath sounds.     Comments: Breathing comfortably at rest, CTABL, no wheezing, rales or other adventitious sounds auscultated Abdominal:     Palpations: Abdomen is soft.     Tenderness: There is no abdominal tenderness.  Musculoskeletal:     Cervical back: Neck supple.  Skin:    General: Skin is warm and dry.     Comments: Right lower buttocks with 4 to 5 cm area of erythema and induration, central area of skin peeling and thinning and slightly oozing pus with pressure  Neurological:     Mental Status: She is alert.      UC Treatments / Results  Labs (all labs ordered are listed, but only abnormal results are displayed) Labs Reviewed  NOVEL CORONAVIRUS, NAA (HOSP ORDER, SEND-OUT TO REF LAB; TAT 18-24 HRS)  POC SARS CORONAVIRUS 2 AG -  ED  POC SARS CORONAVIRUS 2 AG    EKG   Radiology No results found.  Procedures Incision and Drainage  Date/Time: 06/08/2019 11:34 AM Performed by: Damean Poffenberger, Junius Creamer, PA-C Authorized by: Domenick Gong, MD   Consent:    Consent obtained:  Verbal   Consent given by:  Patient   Risks discussed:  Bleeding, incomplete drainage and pain   Alternatives discussed:  No treatment Location:    Type:  Abscess   Size:  5   Location:  Anogenital   Anogenital location: Right buttock. Pre-procedure details:    Skin preparation:  Betadine Anesthesia (see MAR for exact  dosages):    Anesthesia method:  Local infiltration   Local anesthetic:  Lidocaine 2% w/o epi Procedure type:    Complexity:  Simple Procedure details:    Incision types:  Single straight   Incision depth:  Subcutaneous   Scalpel blade:  11   Wound management:  Probed and deloculated   Drainage:  Bloody and purulent   Drainage amount: mild.   Packing materials:  1/4 in iodoform gauze Post-procedure details:    Patient tolerance of procedure:  Tolerated well, no immediate complications   (including critical care time)  Medications Ordered in UC Medications - No data to display  Initial Impression / Assessment and Plan / UC Course  I have reviewed the triage vital signs and the nursing notes.  Pertinent labs & imaging results that were available during my care of the patient were reviewed by me and considered in my medical decision making (see chart for details).     Point-of-care Covid negative, PCR pending.  Likely Covid versus influenza versus other viral URI.  Recommending symptomatic and supportive care, quarantining, rest and  push fluids.  I&D performed of abscess, packing placed.  Packing to be removed in 24 to 48 hours, may do at home or return here for recheck and removal.  Initiating on Augmentin given location, no sign of perirectal abscess at this time.  Continue to monitor for gradual healing.  Discussed strict return precautions. Patient verbalized understanding and is agreeable with plan.  Final Clinical Impressions(s) / UC Diagnoses   Final diagnoses:  Abscess of buttock, right  Viral URI with cough     Discharge Instructions     Abscess Please begin Augmentin twice daily for 1 week  Remove packing in 24-48 hours or return here for packing removal/recheck Mon Am  Apply warm compresses/hot rags to area with massage to express further drainage especially the first 24-48 hours  Return if symptoms returning or not improving  URI Rest Fluids COVID swab  pending, quarantine until results return Begin daily cetirizine to help with congestion Ibuprofen or naprosyn for body aches, sinus pressure and and fevers Follow up if symptoms not improving or worsening   ED Prescriptions    Medication Sig Dispense Auth. Provider   amoxicillin-clavulanate (AUGMENTIN) 875-125 MG tablet Take 1 tablet by mouth every 12 (twelve) hours for 7 days. 14 tablet Cortlynn Hollinsworth C, PA-C   fluticasone (FLONASE) 50 MCG/ACT nasal spray Place 1-2 sprays into both nostrils daily for 7 days. 1 g Cullen Lahaie C, PA-C   Cetirizine HCl 10 MG CAPS Take 1 capsule (10 mg total) by mouth daily for 10 days. 10 capsule Ashlon Lottman C, PA-C   naproxen (NAPROSYN) 500 MG tablet Take 1 tablet (500 mg total) by mouth 2 (two) times daily. 30 tablet Mistina Coatney, [place] C, PA-C     PDMP not reviewed this encounter.   Lew Dawes, [place] 06/09/19 [phone]

## 2019-06-14 LAB — CYTOLOGY - PAP
Comment: NEGATIVE
Diagnosis: NEGATIVE
High risk HPV: NEGATIVE

## 2019-06-24 ENCOUNTER — Encounter (HOSPITAL_COMMUNITY): Payer: Self-pay

## 2019-06-24 ENCOUNTER — Ambulatory Visit (HOSPITAL_COMMUNITY)
Admission: EM | Admit: 2019-06-24 | Discharge: 2019-06-24 | Disposition: A | Discharge status: Home / Self Care | Attending: Family Medicine | Admitting: Family Medicine

## 2019-06-24 DIAGNOSIS — Z20822 Contact with and (suspected) exposure to covid-19: Secondary | ICD-10-CM | POA: Diagnosis present

## 2019-06-24 NOTE — ED Triage Notes (Addendum)
Pt presents to UC for COVID testing after been exposed 2 days ago.Pt denies any signs and symptoms.

## 2019-06-24 NOTE — Discharge Instructions (Addendum)
We will call you if your COVID is positive or you can check your my chart for results.

## 2019-06-25 ENCOUNTER — Ambulatory Visit (HOSPITAL_COMMUNITY)
Admission: EM | Admit: 2019-06-25 | Discharge: 2019-06-25 | Disposition: A | Discharge status: Home / Self Care | Attending: Physician Assistant | Admitting: Physician Assistant

## 2019-06-25 ENCOUNTER — Other Ambulatory Visit: Payer: Self-pay

## 2019-06-25 ENCOUNTER — Encounter (HOSPITAL_COMMUNITY): Payer: Self-pay

## 2019-06-25 DIAGNOSIS — H5789 Other specified disorders of eye and adnexa: Secondary | ICD-10-CM

## 2019-06-25 DIAGNOSIS — L210 Seborrhea capitis: Secondary | ICD-10-CM | POA: Diagnosis not present

## 2019-06-25 MED ORDER — SELENIUM SULFIDE 2.25 % EX SHAM: 10.0000 mL | MEDICATED_SHAMPOO | CUTANEOUS | 0 refills | Status: AC

## 2019-06-25 NOTE — ED Provider Notes (Signed)
[facility]    CSN: [id number] Arrival date & time: 06/24/19  1021      History   Chief Complaint Chief Complaint  Patient presents with  . COVID exposure    HPI Karen [name] is a 44 y.o. female.   44 year old female presents today for Covid exposure and testing.  She has past medical history of COPD, diabetes, fibromyalgia, GERD, hypertension, hypothyroid, manic depression, sleep apnea.  Patient very upset and crying.  Reporting she is worried about Covid due to her medical issues.  She denies any current symptoms to include cough, chest congestion, fever, chills, body aches, night sweats, loss of taste or smell, sore throat, ear pain, diarrhea.  ROS per HPI      Past Medical History:  Diagnosis Date  . Atypical chest pain    a. Cath 11/2017 - R/LHC showing 30% mid LAD, otherwise no significant disease, EF 55-65%, normal LVEDP, normal heart pressures (no evidence of significant CAD or CHF).  . COPD (chronic obstructive pulmonary disease) (HCC)   . Diabetes mellitus (HCC) 04/17/2017  . Fibromyalgia syndrome 04/17/2017  . GERD (gastroesophageal reflux disease) 04/17/2017  . Hypertension   . Hypothyroidism 04/17/2017  . 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 Obstructive sleep apnea 09/26/2017  . Recurrent syncope   . Tobacco use 11/15/2017    Patient Active Problem List   Diagnosis Date Noted  . Boil of buttock 06/04/2019  . Healthcare maintenance 06/04/2019  . 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 Dizziness 08/14/2018  . Iron deficiency anemia 08/14/2018  . Morbid obesity (HCC) 05/15/2018  . 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 Atypical chest pain   . Dysuria 11/15/2017  . (HFpEF) heart failure with preserved ejection fraction (HCC) 11/14/2017  . Obstructive sleep apnea 09/26/2017  . Hypothyroidism 04/17/2017  . Diabetes mellitus (HCC) 04/17/2017  . Hypertension associated with diabetes (HCC) 04/17/2017  . COPD (chronic obstructive pulmonary  disease) (HCC) 04/17/2017  . GERD (gastroesophageal reflux disease) 04/17/2017  . 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 Fibromyalgia syndrome 04/17/2017    Past Surgical History:  Procedure Laterality Date  . btl    . CHOLECYSTECTOMY    . RIGHT/LEFT HEART CATH AND CORONARY ANGIOGRAPHY N/A 12/07/2017   Procedure: RIGHT/LEFT HEART CATH AND CORONARY ANGIOGRAPHY;  Surgeon: Marykay Lex, MD;  Location: [facility] INVASIVE CV LAB;  Service: Cardiovascular;  Laterality: N/A;  . shoulder Bilateral     OB History   No obstetric history on file.      Home Medications    Prior to Admission medications   Medication Sig Start Date End Date Taking? Authorizing Provider  albuterol (PROVENTIL HFA;VENTOLIN HFA) 108 (90 Base) MCG/ACT inhaler Inhale 2 puffs into the lungs every 4 (four) hours as needed for wheezing or shortness of breath. 07/13/18   Eulah Pont, MD  amLODipine (NORVASC) 10 MG tablet Take 1 tablet (10 mg total) by mouth daily. 07/13/18   Eulah Pont, MD  ARIPiprazole (ABILIFY) 10 MG tablet Take 10 mg by mouth at bedtime.    [provider]  Blood Glucose Monitoring Suppl (FREESTYLE LITE) DEVI Use to check blood sugar up to 3 times a day 09/26/17   Eulah Pont, MD  cephALEXin (KEFLEX) 500 MG capsule Take 1 capsule (500 mg total) by mouth 2 (two) times daily. 04/20/19   Mardella Layman, MD  Cetirizine HCl 10 MG CAPS Take 1 capsule (10 mg total) by mouth daily for 10 days. 06/08/19 06/18/19  Wieters, Hallie C, PA-C  divalproex (DEPAKOTE) 250  MG DR tablet Take 250 mg by mouth 2 (two) times daily.     [provider]  empagliflozin (JARDIANCE) 10 MG TABS tablet Take 10 mg by mouth daily. 07/13/18   Ledell Noss, MD  exenatide (BYETTA 10 MCG PEN) 10 MCG/0.04ML SOPN injection Inject 0.04 mLs (10 mcg total) into the skin 2 (two) times daily with a meal. 07/13/18   Ledell Noss, MD  FLUoxetine (PROZAC) 20 MG tablet Take 20 mg by mouth daily.    [provider]  fluticasone (FLONASE) 50  MCG/ACT nasal spray Place 1-2 sprays into both nostrils daily for 7 days. 06/08/19 06/15/19  Wieters, Hallie C, PA-C  gabapentin (NEURONTIN) 300 MG capsule Take 3 capsules (900 mg total) by mouth 2 (two) times daily. 07/13/18   Ledell Noss, MD  glucose blood (FREESTYLE LITE) test strip Use to check blood sugar 3 times daily. diag code E11.9. insulin dependent 07/13/18   Ledell Noss, MD  insulin aspart (NOVOLOG FLEXPEN) 100 UNIT/ML FlexPen Inject 10 Units into the skin 3 (three) times daily with meals. 07/13/18   Ledell Noss, MD  Insulin Detemir (LEVEMIR FLEXPEN) 100 UNIT/ML Pen Inject 40 Units into the skin daily at 10 pm. IM program 07/13/18   Ledell Noss, MD  Insulin Pen Needle (NOVOFINE PLUS) 32G X 4 MM MISC 1 Dose by Does not apply route 4 (four) times daily. Use with Levemir and Novolog, IM program 07/13/18   Ledell Noss, MD  iron polysaccharides (NU-IRON) 150 MG capsule Take 1 capsule (150 mg total) by mouth daily. 08/15/18   Seawell, Jaimie A, DO  Lancets (FREESTYLE) lancets Use to check blood sugar 3 times daily. diag code E11.9. insulin dependent 07/13/18   Ledell Noss, MD  levothyroxine (SYNTHROID, LEVOTHROID) 150 MCG tablet Take 1 tablet (150 mcg total) by mouth daily before breakfast. 07/13/18   Ledell Noss, MD  lisinopril (PRINIVIL,ZESTRIL) 40 MG tablet Take 1 tablet (40 mg total) by mouth daily. 07/13/18   Ledell Noss, MD  lovastatin (MEVACOR) 40 MG tablet Take 1 tablet (40 mg total) by mouth at bedtime. 07/13/18   Ledell Noss, MD  meclizine (ANTIVERT) 25 MG tablet Take 1 tablet (25 mg total) by mouth 3 (three) times daily as needed for dizziness. 04/20/19   Vanessa Kick, MD  metFORMIN (GLUCOPHAGE) 500 MG tablet Take 2 tablets (1,000 mg total) by mouth 2 (two) times daily with a meal. 07/13/18   Ledell Noss, MD  methylphenidate (RITALIN) 20 MG tablet Take 1 tablet (20 mg total) by mouth 2 (two) times daily with breakfast and lunch. Take in the morning and at lunch, no later than 3 PM. 12/11/18   Star Age, MD    mirtazapine (REMERON) 15 MG tablet Take 15 mg by mouth at bedtime.    [provider]  montelukast (SINGULAIR) 10 MG tablet Take 1 tablet (10 mg total) by mouth at bedtime. 07/13/18   Ledell Noss, MD  naproxen (NAPROSYN) 500 MG tablet Take 1 tablet (500 mg total) by mouth 2 (two) times daily. 06/08/19   Wieters, Hallie C, PA-C  omeprazole (PRILOSEC) 40 MG capsule Take 1 capsule (40 mg total) by mouth 2 (two) times daily at 8 am and 10 pm. 07/13/18   Ledell Noss, MD  ondansetron (ZOFRAN-ODT) 4 MG disintegrating tablet Take 1 tablet (4 mg total) by mouth every 8 (eight) hours as needed for nausea or vomiting. 04/20/19   Vanessa Kick, MD  prazosin (MINIPRESS) 1 MG capsule Take 1 mg by mouth at bedtime.  [provider]  sucralfate (CARAFATE) 1 g tablet Take 1 tablet (1 g total) by mouth 4 (four) times daily -  with meals and at bedtime for 7 days. Patient taking differently: Take 1 g by mouth daily.  08/02/17 08/02/27  Aviva Kluver B, PA-C  vitamin E 400 UNIT capsule Take 1 capsule (400 Units total) by mouth daily. 03/15/17   Gerhard Munch, MD    Family History Family History  Problem Relation Age of Onset  . Hypertension Mother   . Syncope episode Mother   . Heart disease Maternal Grandmother        unclear details    Social History Social History   Tobacco Use  . Smoking status: Former Smoker    Packs/day: 0.30    Types: Cigarettes    Quit date: 10/29/2017    Years since quitting: 1.6  . Smokeless tobacco: Never Used  . Tobacco comment: 1/3 per day   Substance Use Topics  . Alcohol use: Yes    Comment: rarely  . Drug use: No     Allergies   Aspirin and Tylenol [acetaminophen]   Review of Systems Review of Systems   Physical Exam Triage Vital Signs ED Triage Vitals  Enc Vitals Group     BP 06/24/19 1222 (!) 164/89     Pulse Rate 06/24/19 1222 (!) 114     Resp 06/24/19 1222 20     Temp 06/24/19 1222 98.6 F (37 C)     Temp Source 06/24/19 1222 Oral      SpO2 06/24/19 1222 100 %     Weight --      Height --      Head Circumference --      Peak Flow --      Pain Score 06/24/19 1218 0     Pain Loc --      Pain Edu? --      Excl. in GC? --    No data found.  Updated Vital Signs BP (!) 164/89 (BP Location: Right Arm)   Pulse (!) 114   Temp 98.6 F (37 C) (Oral)   Resp 20   LMP 05/26/2019 (Exact Date)   SpO2 100%   Visual Acuity Right Eye Distance:   Left Eye Distance:   Bilateral Distance:    Right Eye Near:   Left Eye Near:    Bilateral Near:     Physical Exam Vitals and nursing note reviewed.  Constitutional:      General: She is not in acute distress.    Appearance: Normal appearance. She is not ill-appearing, toxic-appearing or diaphoretic.     Comments: Tearful   HENT:     Head: Normocephalic.     Nose: Nose normal.  Eyes:     Conjunctiva/sclera: Conjunctivae normal.  Pulmonary:     Effort: Pulmonary effort is normal.  Musculoskeletal:        General: Normal range of motion.     Cervical back: Normal range of motion.  Skin:    General: Skin is warm and dry.     Findings: No rash.  Neurological:     Mental Status: She is alert.  Psychiatric:        Mood and Affect: Mood normal.     Comments: Anxious       UC Treatments / Results  Labs (all labs ordered are listed, but only abnormal results are displayed) Labs Reviewed  NOVEL CORONAVIRUS, NAA (HOSP ORDER, SEND-OUT TO REF LAB; TAT 18-24  HRS)    EKG   Radiology No results found.  Procedures Procedures (including critical care time)  Medications Ordered in UC Medications - No data to display  Initial Impression / Assessment and Plan / UC Course  I have reviewed the triage vital signs and the nursing notes.  Pertinent labs & imaging results that were available during my care of the patient were reviewed by me and considered in my medical decision making (see chart for details).     Exposure to Covid-Covid swab sent for testing with  labs pending. Patient very anxious about situation.  Patient reassured that if she does test positive that there is outpatient infusion treatments that can be done to help improve outcomes. Pt much calmer before leaving.  Final Clinical Impressions(s) / UC Diagnoses   Final diagnoses:  Exposure to COVID-19 virus     Discharge Instructions     We will call you if your COVID is positive or you can check your my chart for results.     ED Prescriptions    None     PDMP not reviewed this encounter.   Janace Aris, NP 06/25/19 [phone]

## 2019-06-25 NOTE — Discharge Instructions (Signed)
Apply the shampoo 2 times a week for 2-3 weeks   Utilize visine drops for your eye irritation  Follow up with your primary care if no resolution in 2-3 weeks.

## 2019-06-25 NOTE — ED Triage Notes (Signed)
Pt state she thinks she has head lice and she has eye irritation x 2 days. Pt state a family member has head lice.

## 2019-06-25 NOTE — ED Provider Notes (Signed)
[facility]    CSN: [id number] Arrival date & time: 06/25/19  0808      History   Chief Complaint Chief Complaint  Patient presents with  . head lice    HPI Karen [name] is a 44 y.o. female.   Patient reports to urgent care today for head itching and concern for head lice. She reports she has had itching in her scalp for atleast 1 week. She believes there has been something moving in her hair. She reports combing and washing her hair very frequently due to the concern that it is head lice. She reports a family member recently head lice. This person does not live with the patient and has not shared bedding, hair product, combs or hats.   She also reports eye irritation. She denies pain. She reports crying a lot yesterday and wiping her eyes a lot. She is worried " it is too meaty". She denies drainage. She has been using visine eye drops and have helped  She was tested yesterday for COVID and results are pending. No covid related symptoms of fever, chills, cough, congestion, sore throat, nausea, vomiting, diarrhea.      Past Medical History:  Diagnosis Date  . Atypical chest pain    a. Cath 11/2017 - R/LHC showing 30% mid LAD, otherwise no significant disease, EF 55-65%, normal LVEDP, normal heart pressures (no evidence of significant CAD or CHF).  . COPD (chronic obstructive pulmonary disease) (HCC)   . Diabetes mellitus (HCC) 04/17/2017  . Fibromyalgia syndrome 04/17/2017  . GERD (gastroesophageal reflux disease) 04/17/2017  . Hypertension   . Hypothyroidism 04/17/2017  . 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 Obstructive sleep apnea 09/26/2017  . Recurrent syncope   . Tobacco use 11/15/2017    Patient Active Problem List   Diagnosis Date Noted  . Boil of buttock 06/04/2019  . Healthcare maintenance 06/04/2019  . 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 Dizziness 08/14/2018  . Iron deficiency anemia 08/14/2018  . Morbid obesity (HCC) 05/15/2018  .  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 Atypical chest pain   . Dysuria 11/15/2017  . (HFpEF) heart failure with preserved ejection fraction (HCC) 11/14/2017  . Obstructive sleep apnea 09/26/2017  . Hypothyroidism 04/17/2017  . Diabetes mellitus (HCC) 04/17/2017  . Hypertension associated with diabetes (HCC) 04/17/2017  . COPD (chronic obstructive pulmonary disease) (HCC) 04/17/2017  . GERD (gastroesophageal reflux disease) 04/17/2017  . 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 Fibromyalgia syndrome 04/17/2017    Past Surgical History:  Procedure Laterality Date  . btl    . CHOLECYSTECTOMY    . RIGHT/LEFT HEART CATH AND CORONARY ANGIOGRAPHY N/A 12/07/2017   Procedure: RIGHT/LEFT HEART CATH AND CORONARY ANGIOGRAPHY;  Surgeon: Marykay Lex, MD;  Location: [facility] INVASIVE CV LAB;  Service: Cardiovascular;  Laterality: N/A;  . shoulder Bilateral     OB History   No obstetric history on file.      Home Medications    Prior to Admission medications   Medication Sig Start Date End Date Taking? Authorizing Provider  albuterol (PROVENTIL HFA;VENTOLIN HFA) 108 (90 Base) MCG/ACT inhaler Inhale 2 puffs into the lungs every 4 (four) hours as needed for wheezing or shortness of breath. 07/13/18   Eulah Pont, MD  amLODipine (NORVASC) 10 MG tablet Take 1 tablet (10 mg total) by mouth daily. 07/13/18   Eulah Pont, MD  ARIPiprazole (ABILIFY) 10 MG tablet Take 10 mg by mouth at bedtime.    [provider]  Blood Glucose Monitoring  Suppl (FREESTYLE LITE) DEVI Use to check blood sugar up to 3 times a day 09/26/17   Eulah PontBlum, Nina, MD  cephALEXin (KEFLEX) 500 MG capsule Take 1 capsule (500 mg total) by mouth 2 (two) times daily. 04/20/19   Mardella LaymanHagler, Brian, MD  Cetirizine HCl 10 MG CAPS Take 1 capsule (10 mg total) by mouth daily for 10 days. 06/08/19 06/18/19  Wieters, Hallie C, PA-C  divalproex (DEPAKOTE) 250 MG DR tablet Take 250 mg by mouth 2 (two) times daily.     [provider]  empagliflozin  (JARDIANCE) 10 MG TABS tablet Take 10 mg by mouth daily. 07/13/18   Eulah PontBlum, Nina, MD  exenatide (BYETTA 10 MCG PEN) 10 MCG/0.04ML SOPN injection Inject 0.04 mLs (10 mcg total) into the skin 2 (two) times daily with a meal. 07/13/18   Eulah PontBlum, Nina, MD  FLUoxetine (PROZAC) 20 MG tablet Take 20 mg by mouth daily.    [provider]  fluticasone (FLONASE) 50 MCG/ACT nasal spray Place 1-2 sprays into both nostrils daily for 7 days. 06/08/19 06/15/19  Wieters, Hallie C, PA-C  gabapentin (NEURONTIN) 300 MG capsule Take 3 capsules (900 mg total) by mouth 2 (two) times daily. 07/13/18   Eulah PontBlum, Nina, MD  glucose blood (FREESTYLE LITE) test strip Use to check blood sugar 3 times daily. diag code E11.9. insulin dependent 07/13/18   Eulah PontBlum, Nina, MD  insulin aspart (NOVOLOG FLEXPEN) 100 UNIT/ML FlexPen Inject 10 Units into the skin 3 (three) times daily with meals. 07/13/18   Eulah PontBlum, Nina, MD  Insulin Detemir (LEVEMIR FLEXPEN) 100 UNIT/ML Pen Inject 40 Units into the skin daily at 10 pm. IM program 07/13/18   Eulah PontBlum, Nina, MD  Insulin Pen Needle (NOVOFINE PLUS) 32G X 4 MM MISC 1 Dose by Does not apply route 4 (four) times daily. Use with Levemir and Novolog, IM program 07/13/18   Eulah PontBlum, Nina, MD  iron polysaccharides (NU-IRON) 150 MG capsule Take 1 capsule (150 mg total) by mouth daily. 08/15/18   Seawell, Jaimie A, DO  Lancets (FREESTYLE) lancets Use to check blood sugar 3 times daily. diag code E11.9. insulin dependent 07/13/18   Eulah PontBlum, Nina, MD  levothyroxine (SYNTHROID, LEVOTHROID) 150 MCG tablet Take 1 tablet (150 mcg total) by mouth daily before breakfast. 07/13/18   Eulah PontBlum, Nina, MD  lisinopril (PRINIVIL,ZESTRIL) 40 MG tablet Take 1 tablet (40 mg total) by mouth daily. 07/13/18   Eulah PontBlum, Nina, MD  lovastatin (MEVACOR) 40 MG tablet Take 1 tablet (40 mg total) by mouth at bedtime. 07/13/18   Eulah PontBlum, Nina, MD  meclizine (ANTIVERT) 25 MG tablet Take 1 tablet (25 mg total) by mouth 3 (three) times daily as needed for dizziness. 04/20/19    Mardella LaymanHagler, Brian, MD  metFORMIN (GLUCOPHAGE) 500 MG tablet Take 2 tablets (1,000 mg total) by mouth 2 (two) times daily with a meal. 07/13/18   Eulah PontBlum, Nina, MD  methylphenidate (RITALIN) 20 MG tablet Take 1 tablet (20 mg total) by mouth 2 (two) times daily with breakfast and lunch. Take in the morning and at lunch, no later than 3 PM. 12/11/18   Huston FoleyAthar, Saima, MD  mirtazapine (REMERON) 15 MG tablet Take 15 mg by mouth at bedtime.    [provider]  montelukast (SINGULAIR) 10 MG tablet Take 1 tablet (10 mg total) by mouth at bedtime. 07/13/18   Eulah PontBlum, Nina, MD  naproxen (NAPROSYN) 500 MG tablet Take 1 tablet (500 mg total) by mouth 2 (two) times daily. 06/08/19   Wieters, Junius CreamerHallie C, PA-C  omeprazole (PRILOSEC) 40 MG capsule Take 1 capsule (40 mg total) by mouth 2 (two) times daily at 8 am and 10 pm. 07/13/18   Eulah Pont, MD  ondansetron (ZOFRAN-ODT) 4 MG disintegrating tablet Take 1 tablet (4 mg total) by mouth every 8 (eight) hours as needed for nausea or vomiting. 04/20/19   Mardella Layman, MD  prazosin (MINIPRESS) 1 MG capsule Take 1 mg by mouth at bedtime.    [provider]  Selenium Sulfide 2.25 % SHAM Apply 10 mLs topically 2 (two) times a week for 6 doses. 06/27/19 07/16/19  Andrell Tallman, Veryl Speak, PA-C  sucralfate (CARAFATE) 1 g tablet Take 1 tablet (1 g total) by mouth 4 (four) times daily -  with meals and at bedtime for 7 days. Patient taking differently: Take 1 g by mouth daily.  08/02/17 08/02/27  Aviva Kluver B, PA-C  vitamin E 400 UNIT capsule Take 1 capsule (400 Units total) by mouth daily. 03/15/17   Gerhard Munch, MD    Family History Family History  Problem Relation Age of Onset  . Hypertension Mother   . Syncope episode Mother   . Heart disease Maternal Grandmother        unclear details    Social History Social History   Tobacco Use  . Smoking status: Former Smoker    Packs/day: 0.30    Types: Cigarettes    Quit date: 10/29/2017    Years since quitting: 1.6  .  Smokeless tobacco: Never Used  . Tobacco comment: 1/3 per day   Substance Use Topics  . Alcohol use: Yes    Comment: rarely  . Drug use: No     Allergies   Aspirin and Tylenol [acetaminophen]   Review of Systems Review of Systems  Constitutional: Negative for chills and fever.  HENT: Negative for congestion, ear pain, rhinorrhea, sinus pressure, sinus pain and sore throat.   Eyes: Positive for itching. Negative for pain and visual disturbance.  Respiratory: Negative for cough, chest tightness and shortness of breath.   Cardiovascular: Negative for chest pain and palpitations.  Gastrointestinal: Negative for abdominal pain, diarrhea, nausea and vomiting.  Genitourinary: Negative for dysuria and hematuria.  Musculoskeletal: Negative for arthralgias, back pain and myalgias.  Skin: Negative for color change, pallor, rash and wound.  Neurological: Negative for seizures, syncope and headaches.  All other systems reviewed and are negative.    Physical Exam Triage Vital Signs ED Triage Vitals  Enc Vitals Group     BP 06/25/19 0835 (!) 144/85     Pulse Rate 06/25/19 0835 (!) 114     Resp 06/25/19 0835 18     Temp 06/25/19 0835 98.2 F (36.8 C)     Temp src --      SpO2 06/25/19 0835 100 %     Weight 06/25/19 0836 250 lb (113.4 kg)     Height --      Head Circumference --      Peak Flow --      Pain Score 06/25/19 0833 3     Pain Loc --      Pain Edu? --      Excl. in GC? --    No data found.  Updated Vital Signs BP (!) 144/85 (BP Location: Right Arm)   Pulse (!) 114   Temp 98.2 F (36.8 C)   Resp 18   Wt 250 lb (113.4 kg)   LMP 06/25/2019   SpO2 100%   BMI 39.16 kg/m  Visual Acuity Right Eye Distance:   Left Eye Distance:   Bilateral Distance:    Right Eye Near:   Left Eye Near:    Bilateral Near:     Physical Exam Vitals and nursing note reviewed.  Constitutional:      General: She is not in acute distress.    Appearance: Normal appearance. She is  well-developed. She is not ill-appearing.  HENT:     Head: Normocephalic and atraumatic.     Right Ear: Tympanic membrane, ear canal and external ear normal.     Left Ear: Tympanic membrane, ear canal and external ear normal.  Eyes:     General: No scleral icterus.       Right eye: No discharge.        Left eye: No discharge.     Extraocular Movements: Extraocular movements intact.     Conjunctiva/sclera: Conjunctivae normal.     Pupils: Pupils are equal, round, and reactive to light.     Comments: No injection.   Cardiovascular:     Rate and Rhythm: Regular rhythm. Tachycardia present.     Heart sounds: No murmur.  Pulmonary:     Effort: Pulmonary effort is normal. No respiratory distress.     Breath sounds: Normal breath sounds.  Abdominal:     Palpations: Abdomen is soft.     Tenderness: There is no abdominal tenderness.  Musculoskeletal:     Cervical back: Neck supple.  Skin:    General: Skin is warm and dry.     Comments: Evidence of dry scalp. No sign of eggs or live lice  Neurological:     General: No focal deficit present.     Mental Status: She is alert and oriented to person, place, and time.  Psychiatric:        Mood and Affect: Mood normal.        Behavior: Behavior normal.        Thought Content: Thought content normal.        Judgment: Judgment normal.      UC Treatments / Results  Labs (all labs ordered are listed, but only abnormal results are displayed) Labs Reviewed - No data to display  EKG   Radiology No results found.  Procedures Procedures (including critical care time)  Medications Ordered in UC Medications - No data to display  Initial Impression / Assessment and Plan / UC Course  I have reviewed the triage vital signs and the nursing notes.  Pertinent labs & imaging results that were available during my care of the patient were reviewed by me and considered in my medical decision making (see chart for details).     #Dry  scalp #eye irritation - appears to be dry scalp related. No evidence of parasitic infection and given not a close contact with relative,  Will treat with selenium shampoo x 2-3 weeks.  - Eye exam benign, visine eye drops appear to have been effective. Continue this   Final Clinical Impressions(s) / UC Diagnoses   Final diagnoses:  Dandruff in adult  Eye irritation     Discharge Instructions     Apply the shampoo 2 times a week for 2-3 weeks   Utilize visine drops for your eye irritation  Follow up with your primary care if no resolution in 2-3 weeks.       ED Prescriptions    Medication Sig Dispense Auth. Provider   Selenium Sulfide 2.25 % SHAM Apply 10 mLs topically 2 (two) times  a week for 6 doses. 180 mL Montavis Schubring, Veryl Speak, PA-C     PDMP not reviewed this encounter.   Hermelinda Medicus, PA-C 06/25/19 [phone]

## 2019-06-26 LAB — NOVEL CORONAVIRUS, NAA (HOSP ORDER, SEND-OUT TO REF LAB; TAT 18-24 HRS): SARS-CoV-2, NAA: NOT DETECTED

## 2019-07-19 ENCOUNTER — Ambulatory Visit (HOSPITAL_COMMUNITY)
Admission: EM | Admit: 2019-07-19 | Discharge: 2019-07-19 | Disposition: A | Discharge status: Home / Self Care | Attending: Emergency Medicine | Admitting: Emergency Medicine

## 2019-07-19 ENCOUNTER — Encounter (HOSPITAL_COMMUNITY): Payer: Self-pay

## 2019-07-19 ENCOUNTER — Other Ambulatory Visit: Payer: Self-pay

## 2019-07-19 DIAGNOSIS — R197 Diarrhea, unspecified: Secondary | ICD-10-CM | POA: Diagnosis present

## 2019-07-19 DIAGNOSIS — R0602 Shortness of breath: Secondary | ICD-10-CM | POA: Diagnosis present

## 2019-07-19 DIAGNOSIS — M791 Myalgia, unspecified site: Secondary | ICD-10-CM | POA: Diagnosis present

## 2019-07-19 DIAGNOSIS — R0981 Nasal congestion: Secondary | ICD-10-CM | POA: Diagnosis present

## 2019-07-19 DIAGNOSIS — Z20822 Contact with and (suspected) exposure to covid-19: Secondary | ICD-10-CM | POA: Insufficient documentation

## 2019-07-19 LAB — POC SARS CORONAVIRUS 2 AG: SARS Coronavirus 2 Ag: NEGATIVE

## 2019-07-19 LAB — POC SARS CORONAVIRUS 2 AG -  ED: SARS Coronavirus 2 Ag: NEGATIVE

## 2019-07-19 NOTE — Discharge Instructions (Signed)
Your COVID test is pending.  You should self quarantine until your test result is back and is negative.    Take Tylenol as needed for fever or discomfort.  Rest and keep yourself hydrated.    Go to the emergency department if you develop high fever, shortness of breath, severe diarrhea, or other concerning symptoms.    

## 2019-07-19 NOTE — ED Provider Notes (Addendum)
[facility]    CSN: [id number] Arrival date & time: 07/19/19  1120      History   Chief Complaint Chief Complaint  Patient presents with  . Appointment    1130  . Diarrhea  . Covid Exposure    HPI Karen [name] is a 44 y.o. female.   Patient reports that she has had a positive Covid exposure x4 days ago.  Reports that she is experiencing nasal congestion, headache, body aches, low-grade fever, diarrhea, shortness of breath with activity, some chest tightness with deep breathing.  Reports that her husband is also not feeling well at this time.  Denies having Covid testing in the last 2 weeks.  ROS per HPI  The history is provided by the patient.    Past Medical History:  Diagnosis Date  . Atypical chest pain    a. Cath 11/2017 - R/LHC showing 30% mid LAD, otherwise no significant disease, EF 55-65%, normal LVEDP, normal heart pressures (no evidence of significant CAD or CHF).  . COPD (chronic obstructive pulmonary disease) (HCC)   . Diabetes mellitus (HCC) 04/17/2017  . Fibromyalgia syndrome 04/17/2017  . GERD (gastroesophageal reflux disease) 04/17/2017  . Hypertension   . Hypothyroidism 04/17/2017  . 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 Obstructive sleep apnea 09/26/2017  . Recurrent syncope   . Tobacco use 11/15/2017    Patient Active Problem List   Diagnosis Date Noted  . Boil of buttock 06/04/2019  . Healthcare maintenance 06/04/2019  . 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 Dizziness 08/14/2018  . Iron deficiency anemia 08/14/2018  . Morbid obesity (HCC) 05/15/2018  . 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 Atypical chest pain   . Dysuria 11/15/2017  . (HFpEF) heart failure with preserved ejection fraction (HCC) 11/14/2017  . Obstructive sleep apnea 09/26/2017  . Hypothyroidism 04/17/2017  . Diabetes mellitus (HCC) 04/17/2017  . Hypertension associated with diabetes (HCC) 04/17/2017  . COPD (chronic obstructive pulmonary disease) (HCC)  04/17/2017  . GERD (gastroesophageal reflux disease) 04/17/2017  . 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 Fibromyalgia syndrome 04/17/2017    Past Surgical History:  Procedure Laterality Date  . btl    . CHOLECYSTECTOMY    . RIGHT/LEFT HEART CATH AND CORONARY ANGIOGRAPHY N/A 12/07/2017   Procedure: RIGHT/LEFT HEART CATH AND CORONARY ANGIOGRAPHY;  Surgeon: Marykay Lex, MD;  Location: [facility] INVASIVE CV LAB;  Service: Cardiovascular;  Laterality: N/A;  . shoulder Bilateral     OB History   No obstetric history on file.      Home Medications    Prior to Admission medications   Medication Sig Start Date End Date Taking? Authorizing Provider  albuterol (PROVENTIL HFA;VENTOLIN HFA) 108 (90 Base) MCG/ACT inhaler Inhale 2 puffs into the lungs every 4 (four) hours as needed for wheezing or shortness of breath. 07/13/18   Eulah Pont, MD  amLODipine (NORVASC) 10 MG tablet Take 1 tablet (10 mg total) by mouth daily. 07/13/18   Eulah Pont, MD  ARIPiprazole (ABILIFY) 10 MG tablet Take 10 mg by mouth at bedtime.    [provider]  Blood Glucose Monitoring Suppl (FREESTYLE LITE) DEVI Use to check blood sugar up to 3 times a day 09/26/17   Eulah Pont, MD  cephALEXin (KEFLEX) 500 MG capsule Take 1 capsule (500 mg total) by mouth 2 (two) times daily. 04/20/19   Mardella Layman, MD  Cetirizine HCl 10 MG CAPS Take 1 capsule (10 mg total) by mouth daily for 10 days. 06/08/19 06/18/19  Patterson Hammersmith  C, PA-C  divalproex (DEPAKOTE) 250 MG DR tablet Take 250 mg by mouth 2 (two) times daily.     [provider]  empagliflozin (JARDIANCE) 10 MG TABS tablet Take 10 mg by mouth daily. 07/13/18   Eulah Pont, MD  exenatide (BYETTA 10 MCG PEN) 10 MCG/0.04ML SOPN injection Inject 0.04 mLs (10 mcg total) into the skin 2 (two) times daily with a meal. 07/13/18   Eulah Pont, MD  FLUoxetine (PROZAC) 20 MG tablet Take 20 mg by mouth daily.    [provider]  fluticasone (FLONASE) 50 MCG/ACT nasal  spray Place 1-2 sprays into both nostrils daily for 7 days. 06/08/19 06/15/19  Wieters, Hallie C, PA-C  gabapentin (NEURONTIN) 300 MG capsule Take 3 capsules (900 mg total) by mouth 2 (two) times daily. 07/13/18   Eulah Pont, MD  glucose blood (FREESTYLE LITE) test strip Use to check blood sugar 3 times daily. diag code E11.9. insulin dependent 07/13/18   Eulah Pont, MD  insulin aspart (NOVOLOG FLEXPEN) 100 UNIT/ML FlexPen Inject 10 Units into the skin 3 (three) times daily with meals. 07/13/18   Eulah Pont, MD  Insulin Detemir (LEVEMIR FLEXPEN) 100 UNIT/ML Pen Inject 40 Units into the skin daily at 10 pm. IM program 07/13/18   Eulah Pont, MD  Insulin Pen Needle (NOVOFINE PLUS) 32G X 4 MM MISC 1 Dose by Does not apply route 4 (four) times daily. Use with Levemir and Novolog, IM program 07/13/18   Eulah Pont, MD  iron polysaccharides (NU-IRON) 150 MG capsule Take 1 capsule (150 mg total) by mouth daily. 08/15/18   Seawell, Jaimie A, DO  Lancets (FREESTYLE) lancets Use to check blood sugar 3 times daily. diag code E11.9. insulin dependent 07/13/18   Eulah Pont, MD  levothyroxine (SYNTHROID, LEVOTHROID) 150 MCG tablet Take 1 tablet (150 mcg total) by mouth daily before breakfast. 07/13/18   Eulah Pont, MD  lisinopril (PRINIVIL,ZESTRIL) 40 MG tablet Take 1 tablet (40 mg total) by mouth daily. 07/13/18   Eulah Pont, MD  lovastatin (MEVACOR) 40 MG tablet Take 1 tablet (40 mg total) by mouth at bedtime. 07/13/18   Eulah Pont, MD  meclizine (ANTIVERT) 25 MG tablet Take 1 tablet (25 mg total) by mouth 3 (three) times daily as needed for dizziness. 04/20/19   Mardella Layman, MD  metFORMIN (GLUCOPHAGE) 500 MG tablet Take 2 tablets (1,000 mg total) by mouth 2 (two) times daily with a meal. 07/13/18   Eulah Pont, MD  methylphenidate (RITALIN) 20 MG tablet Take 1 tablet (20 mg total) by mouth 2 (two) times daily with breakfast and lunch. Take in the morning and at lunch, no later than 3 PM. 12/11/18   Huston Foley, MD  mirtazapine  (REMERON) 15 MG tablet Take 15 mg by mouth at bedtime.    [provider]  montelukast (SINGULAIR) 10 MG tablet Take 1 tablet (10 mg total) by mouth at bedtime. 07/13/18   Eulah Pont, MD  naproxen (NAPROSYN) 500 MG tablet Take 1 tablet (500 mg total) by mouth 2 (two) times daily. 06/08/19   Wieters, Hallie C, PA-C  omeprazole (PRILOSEC) 40 MG capsule Take 1 capsule (40 mg total) by mouth 2 (two) times daily at 8 am and 10 pm. 07/13/18   Eulah Pont, MD  ondansetron (ZOFRAN-ODT) 4 MG disintegrating tablet Take 1 tablet (4 mg total) by mouth every 8 (eight) hours as needed for nausea or vomiting. 04/20/19   Mardella Layman, MD  prazosin (MINIPRESS) 1 MG capsule Take 1  mg by mouth at bedtime.    [provider]  sucralfate (CARAFATE) 1 g tablet Take 1 tablet (1 g total) by mouth 4 (four) times daily -  with meals and at bedtime for 7 days. Patient taking differently: Take 1 g by mouth daily.  08/02/17 08/02/27  Aviva Kluver B, PA-C  vitamin E 400 UNIT capsule Take 1 capsule (400 Units total) by mouth daily. 03/15/17   Gerhard Munch, MD    Family History Family History  Problem Relation Age of Onset  . Hypertension Mother   . Syncope episode Mother   . Heart disease Maternal Grandmother        unclear details    Social History Social History   Tobacco Use  . Smoking status: Former Smoker    Packs/day: 0.30    Types: Cigarettes    Quit date: 10/29/2017    Years since quitting: 1.7  . Smokeless tobacco: Never Used  . Tobacco comment: 1/3 per day   Substance Use Topics  . Alcohol use: Yes    Comment: rarely  . Drug use: No     Allergies   Aspirin and Tylenol [acetaminophen]   Review of Systems Review of Systems   Physical Exam Triage Vital Signs ED Triage Vitals  Enc Vitals Group     BP 07/19/19 1138 130/85     Pulse Rate 07/19/19 1138 (!) 114     Resp 07/19/19 1138 20     Temp 07/19/19 1138 99.2 F (37.3 C)     Temp Source 07/19/19 1138 Oral     SpO2  07/19/19 1138 99 %     Weight --      Height --      Head Circumference --      Peak Flow --      Pain Score 07/19/19 1142 0     Pain Loc --      Pain Edu? --      Excl. in GC? --    No data found.  Updated Vital Signs BP 130/85 (BP Location: Left Arm)   Pulse (!) 114   Temp 99.2 F (37.3 C) (Oral)   Resp 20   LMP 06/24/2019 (Exact Date)   SpO2 99%   Visual Acuity Right Eye Distance:   Left Eye Distance:   Bilateral Distance:    Right Eye Near:   Left Eye Near:    Bilateral Near:     Physical Exam Vitals and nursing note reviewed.  Constitutional:      General: She is not in acute distress.    Appearance: She is well-developed. She is obese. She is ill-appearing.  HENT:     Head: Normocephalic and atraumatic.     Right Ear: Tympanic membrane normal.     Left Ear: Tympanic membrane normal.     Nose: Congestion and rhinorrhea present.     Mouth/Throat:     Mouth: Mucous membranes are moist.     Pharynx: Posterior oropharyngeal erythema present.  Eyes:     Conjunctiva/sclera: Conjunctivae normal.  Cardiovascular:     Rate and Rhythm: Regular rhythm. Tachycardia present.     Heart sounds: Normal heart sounds. No murmur.  Pulmonary:     Effort: Pulmonary effort is normal. No respiratory distress.     Breath sounds: Normal breath sounds. No stridor. No wheezing, rhonchi or rales.  Abdominal:     General: Bowel sounds are normal.     Palpations: Abdomen is soft.  Tenderness: There is no abdominal tenderness.  Musculoskeletal:     Cervical back: Neck supple.  Skin:    General: Skin is warm and dry.     Capillary Refill: Capillary refill takes less than 2 seconds.  Neurological:     General: No focal deficit present.     Mental Status: She is alert and oriented to person, place, and time.  Psychiatric:        Mood and Affect: Mood normal.        Behavior: Behavior normal.      UC Treatments / Results  Labs (all labs ordered are listed, but only  abnormal results are displayed) Labs Reviewed  NOVEL CORONAVIRUS, NAA (HOSP ORDER, SEND-OUT TO REF LAB; TAT 18-24 HRS)  POC SARS CORONAVIRUS 2 AG -  ED  POC SARS CORONAVIRUS 2 AG    EKG   Radiology No results found.  Procedures Procedures (including critical care time)  Medications Ordered in UC Medications - No data to display  Initial Impression / Assessment and Plan / UC Course  I have reviewed the triage vital signs and the nursing notes.  Pertinent labs & imaging results that were available during my care of the patient were reviewed by me and considered in my medical decision making (see chart for details).     Reports she has had positive Covid exposure x4 days ago.  Rapid Covid testing in office was negative today.  We will do send out Covid testing, and call you with your results.  Continue to quarantine at home.  Instructed on when to go to the emergency room. Final Clinical Impressions(s) / UC Diagnoses   Final diagnoses:  Exposure to 2019 novel coronavirus  Diarrhea, unspecified type  Nasal congestion  Shortness of breath  Myalgia     Discharge Instructions     Your COVID test is pending.  You should self quarantine until your test result is back and is negative.    Take Tylenol as needed for fever or discomfort.  Rest and keep yourself hydrated.    Go to the emergency department if you develop high fever, shortness of breath, severe diarrhea, or other concerning symptoms.       ED Prescriptions    None     I have reviewed the PDMP during this encounter.   Faustino Congress, NP 07/19/19 1235    Faustino Congress, NP 07/19/19 2031

## 2019-07-19 NOTE — ED Triage Notes (Signed)
Pt reports her husband was exposed to a positive case of COVID 2 days ago. Pt reports having diarrhea and nasal congestion x 1 day.

## 2019-07-22 LAB — NOVEL CORONAVIRUS, NAA (HOSP ORDER, SEND-OUT TO REF LAB; TAT 18-24 HRS): SARS-CoV-2, NAA: NOT DETECTED

## 2019-08-09 ENCOUNTER — Ambulatory Visit (INDEPENDENT_AMBULATORY_CARE_PROVIDER_SITE_OTHER)
Admission: RE | Admit: 2019-08-09 | Discharge: 2019-08-09 | Disposition: A | Discharge status: Home / Self Care | Source: Ambulatory Visit

## 2019-08-09 DIAGNOSIS — M79645 Pain in left finger(s): Secondary | ICD-10-CM

## 2019-08-09 DIAGNOSIS — K0889 Other specified disorders of teeth and supporting structures: Secondary | ICD-10-CM

## 2019-08-09 DIAGNOSIS — R21 Rash and other nonspecific skin eruption: Secondary | ICD-10-CM

## 2019-08-09 DIAGNOSIS — L0291 Cutaneous abscess, unspecified: Secondary | ICD-10-CM

## 2019-08-09 MED ORDER — SULFAMETHOXAZOLE-TRIMETHOPRIM 800-160 MG PO TABS: 1.0000 | ORAL_TABLET | Freq: Two times a day (BID) | ORAL | 0 refills | Status: AC

## 2019-08-09 NOTE — ED Provider Notes (Signed)
Virtual Visit via Video Note:  Karen [name]  initiated request for Telemedicine visit with [facility] team. I connected with Karen [name]  on 08/09/2019 at 10:23 AM  for a synchronized telemedicine visit using a video enabled HIPPA compliant telemedicine application. I verified that I am speaking with Karen [name]  using two identifiers. Mickie Bail, NP  was physically located in a [facility]gent care site and Ronelle Michie was located at a different location.   The limitations of evaluation and management by telemedicine as well as the availability of in-person appointments were discussed. Patient was informed that she  may incur a bill ( including co-pay) for this virtual visit encounter. Karen [name]  expressed understanding and gave verbal consent to proceed with virtual visit.     History of Present Illness:Karen [name]  is a 44 y.o. female presents for evaluation of toenail thickening and peeling skin on her feet x 1-2 months.  She also reports a "boil" on vulva x 2 days; the area is tender and red; no drainage.  She also reports pain in her left 4th finger x 2 days; she thinks it is "jammed" but no known injury; no wounds or redness.  She also reports left upper tooth ache x 1 week.  She denies fever, chills, sore throat, cough, shortness of breath, abdominal pain, vomiting, diarrhea, rash, or other symptoms.  She denies pregnancy and breastfeeding.     Allergies  Allergen Reactions  . Aspirin Other (See Comments)    Was told by her mother not to take it in the past but was put on it a few years ago and did fine without any adverse effect  . Tylenol [Acetaminophen]     Does not take due to fatty liver disease     Past Medical History:  Diagnosis Date  . Atypical chest pain    a. Cath 11/2017 - R/LHC showing 30% mid LAD, otherwise no significant disease, EF 55-65%, normal LVEDP, normal heart pressures (no evidence of significant CAD or CHF).  . COPD (chronic  obstructive pulmonary disease) (HCC)   . Diabetes mellitus (HCC) 04/17/2017  . Fibromyalgia syndrome 04/17/2017  . GERD (gastroesophageal reflux disease) 04/17/2017  . Hypertension   . Hypothyroidism 04/17/2017  . 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 Obstructive sleep apnea 09/26/2017  . Recurrent syncope   . Tobacco use 11/15/2017     Social History   Tobacco Use  . Smoking status: Former Smoker    Packs/day: 0.30    Types: Cigarettes    Quit date: 10/29/2017    Years since quitting: 1.7  . Smokeless tobacco: Never Used  . Tobacco comment: 1/3 per day   Substance Use Topics  . Alcohol use: Yes    Comment: rarely  . Drug use: No    ROS: as stated in HPI.  All other systems reviewed and negative.      Observations/Objective: Physical Exam  VITALS: Patient denies fever. GENERAL: Alert, appears well and in no acute distress. HEENT: Atraumatic. NECK: Normal movements of the head and neck. CARDIOPULMONARY: No increased WOB. Speaking in clear sentences. I:E ratio WNL.  MS: Moves all visible extremities without noticeable abnormality. PSYCH: Pleasant and cooperative, well-groomed. Speech normal rate and rhythm. Affect is appropriate. Insight and judgement are appropriate. Attention is focused, linear, and appropriate.  NEURO: CN grossly intact. Oriented as arrived to appointment on time with no prompting. Moves both UE equally.  SKIN: No obvious lesions, wounds, erythema, or cyanosis  noted on face or hands.   Assessment and Plan:    ICD-10-CM   1. Abscess  L02.91   2. Rash and nonspecific skin eruption  R21   3. Pain of finger of left hand  M79.645   4. Toothache  K08.89        Follow Up Instructions: Treating vulvar abscess with Septra DS.  Instructed patient to come be seen in person or follow-up with her PCP if it is not improving.  Instructed patient to follow-up with a podiatrist for her foot and toenail issues.  Instructed patient to follow-up with a dentist for her  tooth pain.  Instructed patient to come here to be seen in person or follow-up with her PCP if her finger is not improving.  Instructed patient to establish a primary care provider.      I discussed the assessment and treatment plan with the patient. The patient was provided an opportunity to ask questions and all were answered. The patient agreed with the plan and demonstrated an understanding of the instructions.   The patient was advised to call back or seek an in-person evaluation if the symptoms worsen or if the condition fails to improve as anticipated.      Sharion Balloon, NP  08/09/2019 10:23 AM         Sharion Balloon, NP 08/09/19 1023

## 2019-08-09 NOTE — Discharge Instructions (Signed)
Take the antibiotic as directed for the abscess on your vulva.  Come to the urgent care or follow-up with your primary care provider if it is not improving.    Schedule an appointment with a podiatrist to discuss the issues with your feet and toenails.  You are diabetic and should be seeing a podiatrist regularly.    The antibiotic may help with your tooth ache also.  Schedule an appointment with a dentist as soon as possible.  A dental resource guide is attached.    Follow-up with your primary care provider or come here to be seen in person if your finger is not improving.    Establish a primary care provider.

## 2019-08-27 ENCOUNTER — Other Ambulatory Visit: Payer: Self-pay

## 2019-08-27 ENCOUNTER — Encounter: Payer: Self-pay | Admitting: Radiation Oncology

## 2019-08-27 ENCOUNTER — Ambulatory Visit (INDEPENDENT_AMBULATORY_CARE_PROVIDER_SITE_OTHER): Admitting: Radiation Oncology

## 2019-08-27 VITALS — BP 139/87 | HR 83 | Temp 97.8°F | Ht 67.0 in | Wt 248.3 lb

## 2019-08-27 DIAGNOSIS — Z0000 Encounter for general adult medical examination without abnormal findings: Secondary | ICD-10-CM

## 2019-08-27 DIAGNOSIS — F310 Bipolar disorder, current episode hypomanic: Secondary | ICD-10-CM

## 2019-08-27 DIAGNOSIS — F319 Bipolar disorder, unspecified: Secondary | ICD-10-CM

## 2019-08-27 DIAGNOSIS — Z79899 Other long term (current) drug therapy: Secondary | ICD-10-CM

## 2019-08-27 DIAGNOSIS — E1159 Type 2 diabetes mellitus with other circulatory complications: Secondary | ICD-10-CM

## 2019-08-27 DIAGNOSIS — H9190 Unspecified hearing loss, unspecified ear: Secondary | ICD-10-CM | POA: Insufficient documentation

## 2019-08-27 DIAGNOSIS — M79645 Pain in left finger(s): Secondary | ICD-10-CM | POA: Diagnosis not present

## 2019-08-27 DIAGNOSIS — H9193 Unspecified hearing loss, bilateral: Secondary | ICD-10-CM

## 2019-08-27 DIAGNOSIS — M79646 Pain in unspecified finger(s): Secondary | ICD-10-CM | POA: Insufficient documentation

## 2019-08-27 DIAGNOSIS — I10 Essential (primary) hypertension: Secondary | ICD-10-CM

## 2019-08-27 MED ORDER — FLUOXETINE HCL 20 MG PO TABS: 20.0000 mg | ORAL_TABLET | Freq: Every day | ORAL | 3 refills | Status: DC

## 2019-08-27 NOTE — Assessment & Plan Note (Signed)
Declines HIV at this time. Would prefer to discuss with husband first and postpone until next visit.

## 2019-08-27 NOTE — Assessment & Plan Note (Signed)
Patient reports a few week history of L 4th finger pain at the MCP joint. She denies trauma. She reports she feels that it is swollen and she has occasional shooting pains through it. Unremarkable on exam. No deformity, swelling or overlying skin changes concerning for infection.   No acute concerns. No indication for imaging at this time.  Plan: -conservative treatment with OTC pain meds and ice

## 2019-08-27 NOTE — Patient Instructions (Addendum)
Thank you for coming to your appointment. It was so nice to see you. Today we discussed   Depression -     Ambulatory referral to Integrated [facility] -     Refilled prozac  Hearing Loss -     Referral for hearing screening; Future  Finger pain       -     Ice/ibuprofen  Follow up in 1 month.  Sincerely,  Jenell Milliner, MD

## 2019-08-27 NOTE — Assessment & Plan Note (Signed)
This is chronic. Elevated today in setting of not taking her medications this morning. She reports taking them everyday but she was busy this morning before her appointment and forgot.  Today's Vitals   08/27/19 1401 08/27/19 1403  BP:  139/87  Pulse:  83  Temp:  97.8 F (36.6 C)  TempSrc:  Oral  SpO2:  100%  Weight: 248 lb 4.8 oz (112.6 kg)   Height: 5\' 7"  (1.702 m)   PainSc:  8    Body mass index is 38.89 kg/m.  Plan: -continue current medication regimen -fu in 1  mo

## 2019-08-27 NOTE — Assessment & Plan Note (Addendum)
Patient with elevated PHQ9 today. She reports some stress in her marriage. She denies SI/HI currently although she has had SI in the past. States she hadn't been taking her medications until a month ago when she restarted her abilify and depakote but not her prozac as she was out of refills.  She has not followed up with Phineas Semen as per our last encounter.  Plan: -refill prozac -continue depakote and abilify -meet with ashton today -fu with ashton -fu in clinic in 1 month

## 2019-08-27 NOTE — Progress Notes (Signed)
Internal Medicine Clinic Attending  Case discussed with Dr. Lanierat the time of the visit.  We reviewed the resident's history and exam and pertinent patient test results.  I agree with the assessment, diagnosis, and plan of care documented in the resident's note.   

## 2019-08-27 NOTE — Progress Notes (Signed)
   CC: depression  HPI:  Karen [name] is a 44 y.o. female who presents to the Internal Medicine Clinic for follow up of her depression and other chronic medical issues. Please see Assessment and Plan for full HPI.  Past Medical History:  Diagnosis Date  . Atypical chest pain    a. Cath 11/2017 - R/LHC showing 30% mid LAD, otherwise no significant disease, EF 55-65%, normal LVEDP, normal heart pressures (no evidence of significant CAD or CHF).  . COPD (chronic obstructive pulmonary disease) (HCC)   . Diabetes mellitus (HCC) 04/17/2017  . Fibromyalgia syndrome 04/17/2017  . GERD (gastroesophageal reflux disease) 04/17/2017  . Hypertension   . Hypothyroidism 04/17/2017  . 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 Obstructive sleep apnea 09/26/2017  . Recurrent syncope   . Tobacco use 11/15/2017   Review of Systems:  Please see Assessment and Plan for full ROS.  Physical Exam:  Vitals:   08/27/19 1401  Weight: 248 lb 4.8 oz (112.6 kg)  Height: 5\' 7"  (1.702 m)    Physical Exam  Constitutional: She is oriented to person, place, and time. No distress.  Obese female  HENT:  Head: Normocephalic and atraumatic.  Cardiovascular: Normal rate, regular rhythm, normal heart sounds and intact distal pulses.  No murmur heard. Pulmonary/Chest: Effort normal and breath sounds normal. No respiratory distress. She exhibits no tenderness.  Abdominal: Soft. Bowel sounds are normal. She exhibits no distension. There is no abdominal tenderness.  Musculoskeletal:        General: Tenderness present. No deformity or edema. Normal range of motion.     Cervical back: Normal range of motion.     Comments: L 4th finger tenderness to palpation over MCP joint; normal ROM, no deformity or swelling  Neurological: She is alert and oriented to person, place, and time.  Skin: Skin is warm and dry. She is not diaphoretic. No erythema.  74mmx1mm discoloration of R 1st toe  Psychiatric: Affect normal.  Nursing note  and vitals reviewed.  Assessment & Plan:   See Encounters Tab for problem based charting.  Patient discussed with Dr. 0m

## 2019-09-08 ENCOUNTER — Ambulatory Visit (INDEPENDENT_AMBULATORY_CARE_PROVIDER_SITE_OTHER)
Admission: RE | Admit: 2019-09-08 | Discharge: 2019-09-08 | Disposition: A | Discharge status: Other Acute Inpatient Hospital | Source: Ambulatory Visit

## 2019-09-08 ENCOUNTER — Other Ambulatory Visit: Payer: Self-pay

## 2019-09-08 ENCOUNTER — Encounter (HOSPITAL_COMMUNITY): Payer: Self-pay

## 2019-09-08 ENCOUNTER — Ambulatory Visit (HOSPITAL_COMMUNITY)
Admission: EM | Admit: 2019-09-08 | Discharge: 2019-09-08 | Disposition: A | Discharge status: Home / Self Care | Attending: Family Medicine | Admitting: Family Medicine

## 2019-09-08 DIAGNOSIS — L03032 Cellulitis of left toe: Secondary | ICD-10-CM | POA: Diagnosis not present

## 2019-09-08 DIAGNOSIS — B351 Tinea unguium: Secondary | ICD-10-CM

## 2019-09-08 DIAGNOSIS — L309 Dermatitis, unspecified: Secondary | ICD-10-CM | POA: Diagnosis not present

## 2019-09-08 DIAGNOSIS — L600 Ingrowing nail: Secondary | ICD-10-CM | POA: Diagnosis not present

## 2019-09-08 DIAGNOSIS — M79675 Pain in left toe(s): Secondary | ICD-10-CM

## 2019-09-08 MED ORDER — TRIAMCINOLONE ACETONIDE 0.1 % EX CREA: 1.0000 "application " | TOPICAL_CREAM | Freq: Two times a day (BID) | CUTANEOUS | 0 refills | Status: DC

## 2019-09-08 NOTE — Discharge Instructions (Signed)
Call podiatry for an appointment Use the triamcinolone cream for the hand eczema Ask your doctor about the continuous sugar monitoring

## 2019-09-08 NOTE — ED Triage Notes (Signed)
Pt presents to UC fro in person evaluation after the video visit with the UC team today. Pt states having pain, rash and black spots in her big toes x 3 months approx. Pt is concern as she is diabetic.

## 2019-09-08 NOTE — ED Provider Notes (Signed)
[place]    CSN: [id number] Arrival date & time: 09/08/19  1536      History   Chief Complaint Chief Complaint  Patient presents with  . Nail Problem    HPI Karen [name] is a 44 y.o. female.   HPI  Patient is a poorly controlled insulin diabetic.  Her last hemoglobin A1c was 11.8.  She does not check her blood sugar at home. She had a video visit earlier today because of problems with her toenails.  The toenails on both of her feet are lifting up and it sometimes painful. In addition the toenail on the left foot has a swollen red area at the site. No fever or chills No trauma She states that she also has a rash on her hands.  It started after using a new soap.  She is never had a rash on her hands before.  Her fingers are peeling Patient does have a PCP.  She is scheduled there 1 May  Past Medical History:  Diagnosis Date  . Atypical chest pain    a. Cath 11/2017 - R/LHC showing 30% mid LAD, otherwise no significant disease, EF 55-65%, normal LVEDP, normal heart pressures (no evidence of significant CAD or CHF).  . COPD (chronic obstructive pulmonary disease) ([place])   . Diabetes mellitus ([place]) 04/17/2017  . Fibromyalgia syndrome 04/17/2017  . GERD (gastroesophageal reflux disease) 04/17/2017  . Hypertension   . Hypothyroidism 04/17/2017  . 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 Obstructive sleep apnea 09/26/2017  . Recurrent syncope   . Tobacco use 11/15/2017    Patient Active Problem List   Diagnosis Date Noted  . Hearing loss 08/27/2019  . Finger pain 08/27/2019  . Boil of buttock 06/04/2019  . Healthcare maintenance 06/04/2019  . 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 Dizziness 08/14/2018  . Iron deficiency anemia 08/14/2018  . Morbid obesity ([place]) 05/15/2018  . 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 Atypical chest pain   . Dysuria 11/15/2017  . (HFpEF) heart failure with preserved ejection fraction ([place]) 11/14/2017  . Obstructive sleep apnea  09/26/2017  . Hypothyroidism 04/17/2017  . Diabetes mellitus ([place]) 04/17/2017  . Hypertension associated with diabetes ([place]) 04/17/2017  . COPD (chronic obstructive pulmonary disease) ([place]) 04/17/2017  . GERD (gastroesophageal reflux disease) 04/17/2017  . 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 Fibromyalgia syndrome 04/17/2017    Past Surgical History:  Procedure Laterality Date  . btl    . CHOLECYSTECTOMY    . RIGHT/LEFT HEART CATH AND CORONARY ANGIOGRAPHY N/A 12/07/2017   Procedure: RIGHT/LEFT HEART CATH AND CORONARY ANGIOGRAPHY;  Surgeon: Leonie Man, MD;  Location: [facility];  Service: Cardiovascular;  Laterality: N/A;  . shoulder Bilateral     OB History   No obstetric history on file.      Home Medications    Prior to Admission medications   Medication Sig Start Date End Date Taking? Authorizing Provider  albuterol (PROVENTIL HFA;VENTOLIN HFA) 108 (90 Base) MCG/ACT inhaler Inhale 2 puffs into the lungs every 4 (four) hours as needed for wheezing or shortness of breath. 07/13/18   Ledell Noss, MD  amLODipine (NORVASC) 10 MG tablet Take 1 tablet (10 mg total) by mouth daily. 07/13/18   Ledell Noss, MD  ARIPiprazole (ABILIFY) 10 MG tablet Take 10 mg by mouth at bedtime.    [provider]  Blood Glucose Monitoring Suppl (FREESTYLE LITE) DEVI Use to check blood sugar up to 3 times a day 09/26/17   Ledell Noss,  MD  cephALEXin (KEFLEX) 500 MG capsule Take 1 capsule (500 mg total) by mouth 2 (two) times daily. 04/20/19   Mardella Layman, MD  Cetirizine HCl 10 MG CAPS Take 1 capsule (10 mg total) by mouth daily for 10 days. 06/08/19 06/18/19  Wieters, Hallie C, PA-C  divalproex (DEPAKOTE) 250 MG DR tablet Take 250 mg by mouth 2 (two) times daily.     [provider]  empagliflozin (JARDIANCE) 10 MG TABS tablet Take 10 mg by mouth daily. 07/13/18   Eulah Pont, MD  exenatide (BYETTA 10 MCG PEN) 10 MCG/0.04ML SOPN injection Inject 0.04 mLs (10 mcg total) into the skin  2 (two) times daily with a meal. 07/13/18   Eulah Pont, MD  FLUoxetine (PROZAC) 20 MG tablet Take 1 tablet (20 mg total) by mouth daily. 08/27/19   Jenell Milliner, MD  fluticasone [facility]) 50 MCG/ACT nasal spray Place 1-2 sprays into both nostrils daily for 7 days. 06/08/19 06/15/19  Wieters, Hallie C, PA-C  gabapentin (NEURONTIN) 300 MG capsule Take 3 capsules (900 mg total) by mouth 2 (two) times daily. 07/13/18   Eulah Pont, MD  glucose blood (FREESTYLE LITE) test strip Use to check blood sugar 3 times daily. diag code E11.9. insulin dependent 07/13/18   Eulah Pont, MD  insulin aspart (NOVOLOG FLEXPEN) 100 UNIT/ML FlexPen Inject 10 Units into the skin 3 (three) times daily with meals. 07/13/18   Eulah Pont, MD  Insulin Detemir (LEVEMIR FLEXPEN) 100 UNIT/ML Pen Inject 40 Units into the skin daily at 10 pm. IM program 07/13/18   Eulah Pont, MD  Insulin Pen Needle (NOVOFINE PLUS) 32G X 4 MM MISC 1 Dose by Does not apply route 4 (four) times daily. Use with Levemir and Novolog, IM program 07/13/18   Eulah Pont, MD  iron polysaccharides (NU-IRON) 150 MG capsule Take 1 capsule (150 mg total) by mouth daily. 08/15/18   Seawell, Jaimie A, DO  Lancets (FREESTYLE) lancets Use to check blood sugar 3 times daily. diag code E11.9. insulin dependent 07/13/18   Eulah Pont, MD  levothyroxine (SYNTHROID, LEVOTHROID) 150 MCG tablet Take 1 tablet (150 mcg total) by mouth daily before breakfast. 07/13/18   Eulah Pont, MD  lisinopril (PRINIVIL,ZESTRIL) 40 MG tablet Take 1 tablet (40 mg total) by mouth daily. 07/13/18   Eulah Pont, MD  lovastatin (MEVACOR) 40 MG tablet Take 1 tablet (40 mg total) by mouth at bedtime. 07/13/18   Eulah Pont, MD  meclizine (ANTIVERT) 25 MG tablet Take 1 tablet (25 mg total) by mouth 3 (three) times daily as needed for dizziness. 04/20/19   Mardella Layman, MD  metFORMIN (GLUCOPHAGE) 500 MG tablet Take 2 tablets (1,000 mg total) by mouth 2 (two) times daily with a meal. 07/13/18   Eulah Pont, MD    methylphenidate (RITALIN) 20 MG tablet Take 1 tablet (20 mg total) by mouth 2 (two) times daily with breakfast and lunch. Take in the morning and at lunch, no later than 3 PM. 12/11/18   Huston Foley, MD  montelukast (SINGULAIR) 10 MG tablet Take 1 tablet (10 mg total) by mouth at bedtime. 07/13/18   Eulah Pont, MD  omeprazole (PRILOSEC) 40 MG capsule Take 1 capsule (40 mg total) by mouth 2 (two) times daily at 8 am and 10 pm. 07/13/18   Eulah Pont, MD  ondansetron (ZOFRAN-ODT) 4 MG disintegrating tablet Take 1 tablet (4 mg total) by mouth every 8 (eight) hours as needed for nausea or vomiting. 04/20/19   Mardella Layman, MD  prazosin (MINIPRESS) 1 MG capsule Take 1 mg by mouth at bedtime.    [provider]  sucralfate (CARAFATE) 1 g tablet Take 1 tablet (1 g total) by mouth 4 (four) times daily -  with meals and at bedtime for 7 days. Patient taking differently: Take 1 g by mouth daily.  08/02/17 08/02/27  Aviva Kluver B, PA-C  triamcinolone cream (KENALOG) 0.1 % Apply 1 application topically 2 (two) times daily. 09/08/19   Eustace Moore, MD  vitamin E 400 UNIT capsule Take 1 capsule (400 Units total) by mouth daily. 03/15/17   Gerhard Munch, MD    Family History Family History  Problem Relation Age of Onset  . Hypertension Mother   . Syncope episode Mother   . Heart disease Maternal Grandmother        unclear details    Social History Social History   Tobacco Use  . Smoking status: Former Smoker    Packs/day: 0.30    Types: Cigarettes    Quit date: 10/29/2017    Years since quitting: 1.8  . Smokeless tobacco: Never Used  Substance Use Topics  . Alcohol use: Yes    Comment: rarely  . Drug use: No     Allergies   Aspirin and Tylenol [acetaminophen]   Review of Systems Review of Systems  Musculoskeletal: Positive for gait problem.  Skin: Positive for wound.     Physical Exam Triage Vital Signs ED Triage Vitals [09/08/19 1551]  Enc Vitals Group     BP  134/78     Pulse Rate 80     Resp 19     Temp 98.7 F (37.1 C)     Temp Source Oral     SpO2 100 %     Weight      Height      Head Circumference      Peak Flow      Pain Score      Pain Loc      Pain Edu?      Excl. in GC?    No data found.  Updated Vital Signs BP 134/78 (BP Location: Left Arm)   Pulse 80   Temp 98.7 F (37.1 C) (Oral)   Resp 19   LMP  (Within Weeks) Comment: 2 weeks  SpO2 100%      Physical Exam Constitutional:      General: She is not in acute distress.    Appearance: She is well-developed. She is obese.  HENT:     Head: Normocephalic and atraumatic.  Eyes:     Conjunctiva/sclera: Conjunctivae normal.     Pupils: Pupils are equal, round, and reactive to light.  Cardiovascular:     Rate and Rhythm: Normal rate.  Pulmonary:     Effort: Pulmonary effort is normal. No respiratory distress.  Musculoskeletal:        General: Normal range of motion.     Cervical back: Normal range of motion.  Skin:    General: Skin is warm and dry.     Comments: Lateral aspects of the fingers have peeling from vesiculation.  No fissures or weeping Both great toes have evidence of onychomycosis with nail separation, thickening, yellow discoloration.  Left toe has vesicle at nail edge, no purulence  Neurological:     Mental Status: She is alert.  Psychiatric:        Mood and Affect: Mood normal.        Behavior: Behavior normal.  UC Treatments / Results  Labs (all labs ordered are listed, but only abnormal results are displayed) Labs Reviewed - No data to display  EKG   Radiology No results found.  Procedures Procedures (including critical care time)  Medications Ordered in UC Medications - No data to display  Initial Impression / Assessment and Plan / UC Course  I have reviewed the triage vital signs and the nursing notes.  Pertinent labs & imaging results that were available during my care of the patient were reviewed by me and considered  in my medical decision making (see chart for details).     Recommend follow up with podiatry triamcinolone for hand rash Final Clinical Impressions(s) / UC Diagnoses   Final diagnoses:  Paronychia of great toe of left foot  Onychomycosis with ingrown toenail  Eczema of both hands     Discharge Instructions     Call podiatry for an appointment Use the triamcinolone cream for the hand eczema Ask your doctor about the continuous sugar monitoring    ED Prescriptions    Medication Sig Dispense Auth. Provider   triamcinolone cream (KENALOG) 0.1 % Apply 1 application topically 2 (two) times daily. 30 g Eustace Moore, MD     PDMP not reviewed this encounter.   Eustace Moore, MD 09/08/19 [phone]

## 2019-09-08 NOTE — ED Provider Notes (Signed)
Virtual Visit via Video Note:  Karen [name]  initiated request for Telemedicine visit with [facility] team. I connected with Karen [name]  on 09/08/2019 at 3:36 PM  for a synchronized telemedicine visit using a video enabled HIPPA compliant telemedicine application. I verified that I am speaking with Karen [name]  using two identifiers. Wallis Bamberg, PA-C  was physically located in a [facility] Urgent care site and Joe Tanney was located at a different location.   The limitations of evaluation and management by telemedicine as well as the availability of in-person appointments were discussed. Patient was informed that she  may incur a bill ( including co-pay) for this virtual visit encounter. Karen [name]  expressed understanding and gave verbal consent to proceed with virtual visit.     History of Present Illness:Karen [name]  is a 44 y.o. female presents with 2 to 34-month history of persistent rash in her toes, left-sided in between her first and second toes.  States that she now has a black spot and is very painful.  She has diabetes.  Has already had a video visit for this.  ROS  No current facility-administered medications for this encounter.   Current Outpatient Medications  Medication Sig Dispense Refill  . albuterol (PROVENTIL HFA;VENTOLIN HFA) 108 (90 Base) MCG/ACT inhaler Inhale 2 puffs into the lungs every 4 (four) hours as needed for wheezing or shortness of breath. 3 Inhaler 3  . amLODipine (NORVASC) 10 MG tablet Take 1 tablet (10 mg total) by mouth daily. 90 tablet 3  . ARIPiprazole (ABILIFY) 10 MG tablet Take 10 mg by mouth at bedtime.    . Blood Glucose Monitoring Suppl (FREESTYLE LITE) DEVI Use to check blood sugar up to 3 times a day 1 each 1  . cephALEXin (KEFLEX) 500 MG capsule Take 1 capsule (500 mg total) by mouth 2 (two) times daily. 10 capsule 0  . Cetirizine HCl 10 MG CAPS Take 1 capsule (10 mg total) by mouth daily for 10 days. 10 capsule 0  .  divalproex (DEPAKOTE) 250 MG DR tablet Take 250 mg by mouth 2 (two) times daily.     . empagliflozin (JARDIANCE) 10 MG TABS tablet Take 10 mg by mouth daily. 90 tablet 3  . exenatide (BYETTA 10 MCG PEN) 10 MCG/0.04ML SOPN injection Inject 0.04 mLs (10 mcg total) into the skin 2 (two) times daily with a meal. 45 pen 3  . FLUoxetine (PROZAC) 20 MG tablet Take 1 tablet (20 mg total) by mouth daily. 90 tablet 3  . fluticasone (FLONASE) 50 MCG/ACT nasal spray Place 1-2 sprays into both nostrils daily for 7 days. 1 g 0  . gabapentin (NEURONTIN) 300 MG capsule Take 3 capsules (900 mg total) by mouth 2 (two) times daily. 540 capsule 3  . glucose blood (FREESTYLE LITE) test strip Use to check blood sugar 3 times daily. diag code E11.9. insulin dependent 300 each 3  . insulin aspart (NOVOLOG FLEXPEN) 100 UNIT/ML FlexPen Inject 10 Units into the skin 3 (three) times daily with meals. 45 mL 3  . Insulin Detemir (LEVEMIR FLEXPEN) 100 UNIT/ML Pen Inject 40 Units into the skin daily at 10 pm. IM program 135 mL 3  . Insulin Pen Needle (NOVOFINE PLUS) 32G X 4 MM MISC 1 Dose by Does not apply route 4 (four) times daily. Use with Levemir and Novolog, IM program 390 each 3  . iron polysaccharides (NU-IRON) 150 MG capsule Take 1 capsule (150 mg total) by mouth daily.  30 capsule 2  . Lancets (FREESTYLE) lancets Use to check blood sugar 3 times daily. diag code E11.9. insulin dependent 300 each 3  . levothyroxine (SYNTHROID, LEVOTHROID) 150 MCG tablet Take 1 tablet (150 mcg total) by mouth daily before breakfast. 90 tablet 3  . lisinopril (PRINIVIL,ZESTRIL) 40 MG tablet Take 1 tablet (40 mg total) by mouth daily. 90 tablet 3  . lovastatin (MEVACOR) 40 MG tablet Take 1 tablet (40 mg total) by mouth at bedtime. 90 tablet 3  . meclizine (ANTIVERT) 25 MG tablet Take 1 tablet (25 mg total) by mouth 3 (three) times daily as needed for dizziness. 30 tablet 0  . metFORMIN (GLUCOPHAGE) 500 MG tablet Take 2 tablets (1,000 mg total)  by mouth 2 (two) times daily with a meal. 360 tablet 3  . methylphenidate (RITALIN) 20 MG tablet Take 1 tablet (20 mg total) by mouth 2 (two) times daily with breakfast and lunch. Take in the morning and at lunch, no later than 3 PM. 60 tablet 0  . montelukast (SINGULAIR) 10 MG tablet Take 1 tablet (10 mg total) by mouth at bedtime. 90 tablet 3  . naproxen (NAPROSYN) 500 MG tablet Take 1 tablet (500 mg total) by mouth 2 (two) times daily. 30 tablet 0  . omeprazole (PRILOSEC) 40 MG capsule Take 1 capsule (40 mg total) by mouth 2 (two) times daily at 8 am and 10 pm. 180 capsule 3  . ondansetron (ZOFRAN-ODT) 4 MG disintegrating tablet Take 1 tablet (4 mg total) by mouth every 8 (eight) hours as needed for nausea or vomiting. 15 tablet 0  . prazosin (MINIPRESS) 1 MG capsule Take 1 mg by mouth at bedtime.    . sucralfate (CARAFATE) 1 g tablet Take 1 tablet (1 g total) by mouth 4 (four) times daily -  with meals and at bedtime for 7 days. (Patient taking differently: Take 1 g by mouth daily. ) 28 tablet 0  . vitamin E 400 UNIT capsule Take 1 capsule (400 Units total) by mouth daily. 30 capsule 0     Allergies  Allergen Reactions  . Aspirin Other (See Comments)    Was told by her mother not to take it in the past but was put on it a few years ago and did fine without any adverse effect  . Tylenol [Acetaminophen]     Does not take due to fatty liver disease     Past Medical History:  Diagnosis Date  . Atypical chest pain    a. Cath 11/2017 - R/LHC showing 30% mid LAD, otherwise no significant disease, EF 55-65%, normal LVEDP, normal heart pressures (no evidence of significant CAD or CHF).  . COPD (chronic obstructive pulmonary disease) (HCC)   . Diabetes mellitus (HCC) 04/17/2017  . Fibromyalgia syndrome 04/17/2017  . GERD (gastroesophageal reflux disease) 04/17/2017  . Hypertension   . Hypothyroidism 04/17/2017  . 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 Obstructive sleep apnea 09/26/2017  .  Recurrent syncope   . Tobacco use 11/15/2017    Past Surgical History:  Procedure Laterality Date  . btl    . CHOLECYSTECTOMY    . RIGHT/LEFT HEART CATH AND CORONARY ANGIOGRAPHY N/A 12/07/2017   Procedure: RIGHT/LEFT HEART CATH AND CORONARY ANGIOGRAPHY;  Surgeon: Marykay Lex, MD;  Location: [facility];  Service: Cardiovascular;  Laterality: N/A;  . shoulder Bilateral       Observations/Objective: Physical Exam Constitutional:      General: She is not in acute distress.  Appearance: Normal appearance. She is well-developed. She is not ill-appearing, toxic-appearing or diaphoretic.  Eyes:     Extraocular Movements: Extraocular movements intact.  Pulmonary:     Effort: Pulmonary effort is normal.  Neurological:     General: No focal deficit present.     Mental Status: She is alert and oriented to person, place, and time.  Psychiatric:        Mood and Affect: Mood normal.        Behavior: Behavior normal.        Thought Content: Thought content normal.        Judgment: Judgment normal.      Assessment and Plan:  1. Pain of left great toe    Given duration of problem, diagnosis of diabetes recommended an in person OV. Patient is agreeable to this and will come in.    Follow Up Instructions:    I discussed the assessment and treatment plan with the patient. The patient was provided an opportunity to ask questions and all were answered. The patient agreed with the plan and demonstrated an understanding of the instructions.   The patient was advised to call back or seek an in-person evaluation if the symptoms worsen or if the condition fails to improve as anticipated.  I provided 5 minutes of non-face-to-face time during this encounter.    Jaynee Eagles, PA-C  09/08/2019 3:36 PM         Jaynee Eagles, PA-C 09/08/19 1538

## 2019-09-10 ENCOUNTER — Telehealth: Payer: Self-pay | Admitting: Radiation Oncology

## 2019-09-10 DIAGNOSIS — B351 Tinea unguium: Secondary | ICD-10-CM

## 2019-09-10 DIAGNOSIS — H9193 Unspecified hearing loss, bilateral: Secondary | ICD-10-CM

## 2019-09-10 NOTE — Telephone Encounter (Signed)
I placed them.

## 2019-09-10 NOTE — Telephone Encounter (Signed)
Pt calling to follow up with her Audiology Referral.  Pt states at her LOV on  08/27/2019 she discussed  Her hearing.  Pt was also seen at the Urgent Care on 09/08/2019 about the discoloration of her Left big toe and was Referred by the Urgent to the [facility].  Please advise if you can place a Referral for both of her requests.

## 2019-09-11 NOTE — Telephone Encounter (Signed)
Thank you :)

## 2019-09-25 ENCOUNTER — Ambulatory Visit: Admitting: Licensed Clinical Social Worker

## 2019-10-01 ENCOUNTER — Other Ambulatory Visit: Payer: Self-pay

## 2019-10-01 ENCOUNTER — Encounter: Payer: Self-pay | Admitting: Radiation Oncology

## 2019-10-01 ENCOUNTER — Ambulatory Visit (INDEPENDENT_AMBULATORY_CARE_PROVIDER_SITE_OTHER): Admitting: Radiation Oncology

## 2019-10-01 VITALS — BP 116/71 | HR 85 | Temp 98.6°F | Wt 241.8 lb

## 2019-10-01 DIAGNOSIS — B351 Tinea unguium: Secondary | ICD-10-CM

## 2019-10-01 DIAGNOSIS — I10 Essential (primary) hypertension: Secondary | ICD-10-CM

## 2019-10-01 DIAGNOSIS — K029 Dental caries, unspecified: Secondary | ICD-10-CM | POA: Diagnosis not present

## 2019-10-01 DIAGNOSIS — E1142 Type 2 diabetes mellitus with diabetic polyneuropathy: Secondary | ICD-10-CM | POA: Diagnosis not present

## 2019-10-01 DIAGNOSIS — Z794 Long term (current) use of insulin: Secondary | ICD-10-CM

## 2019-10-01 DIAGNOSIS — F310 Bipolar disorder, current episode hypomanic: Secondary | ICD-10-CM

## 2019-10-01 DIAGNOSIS — E1159 Type 2 diabetes mellitus with other circulatory complications: Secondary | ICD-10-CM

## 2019-10-01 DIAGNOSIS — F339 Major depressive disorder, recurrent, unspecified: Secondary | ICD-10-CM

## 2019-10-01 DIAGNOSIS — E1165 Type 2 diabetes mellitus with hyperglycemia: Secondary | ICD-10-CM | POA: Diagnosis not present

## 2019-10-01 DIAGNOSIS — E039 Hypothyroidism, unspecified: Secondary | ICD-10-CM

## 2019-10-01 LAB — BASIC METABOLIC PANEL
Anion gap: 11 (ref 5–15)
BUN: 6 mg/dL (ref 6–20)
CO2: 26 mmol/L (ref 22–32)
Calcium: 8.9 mg/dL (ref 8.9–10.3)
Chloride: 94 mmol/L — ABNORMAL LOW (ref 98–111)
Creatinine, Ser: 0.84 mg/dL (ref 0.44–1.00)
GFR calc Af Amer: >60 mL/min (ref >60)
GFR calc non Af Amer: >60 mL/min (ref >60)
Glucose, Bld: 384 mg/dL — ABNORMAL HIGH (ref 70–99)
Potassium: 3.9 mmol/L (ref 3.5–5.1)
Sodium: 131 mmol/L — ABNORMAL LOW (ref 135–145)

## 2019-10-01 LAB — POCT GLYCOSYLATED HEMOGLOBIN (HGB A1C): Hemoglobin A1C: 12.2 % — AB (ref 4.0–5.6)

## 2019-10-01 LAB — TSH: TSH: 49.287 u[IU]/mL — ABNORMAL HIGH (ref 0.350–4.500)

## 2019-10-01 LAB — GLUCOSE, CAPILLARY: Glucose-Capillary: 357 mg/dL — ABNORMAL HIGH (ref 70–99)

## 2019-10-01 MED ORDER — NAPROXEN 500 MG PO TABS
500.0000 mg | ORAL_TABLET | Freq: Two times a day (BID) | ORAL | 0 refills | Status: DC
Start: 2019-10-01 — End: 2019-10-01

## 2019-10-01 MED ORDER — NAPROXEN 500 MG PO TABS: 500.0000 mg | ORAL_TABLET | Freq: Two times a day (BID) | ORAL | 0 refills | Status: DC

## 2019-10-01 NOTE — Assessment & Plan Note (Addendum)
Patient with hx of fungal infection of her toe nails. She was referred to podiatry and has an appointment coming up in 2 weeks. Recently went to ED for pain of her toes. She denies trauma. On exam there is discoloration of the great toe nails consistent with fungal infection and small ecchymoses of the L great toe. No erythema, swelling or signs of infection.   Plan: -keep feet clean dry -fu with podiatry

## 2019-10-01 NOTE — Assessment & Plan Note (Signed)
This is chronic and uncontrolled.  Glucose today 384. Hgb A1c is 12.2, up from 11.8 3 mo prior which is up from 7.6 6 months prior. She had reported that she hadn't taken her medications at the last visit when her A1c had jumped up 4 points but reports taking them since then. Pharmacy filling data suggests she has not been getting these or at least not filing insurance for them. Patient not currently checking her sugars and unwilling to do so secondary to pain from finger pricks. Patient denies that the number of medications is a problem. She denies that affordability is a problem.  Plan: -fu in 2 weeks with all her medications  -consult to chronic care management

## 2019-10-01 NOTE — Assessment & Plan Note (Addendum)
Pt without dental insurance or regular dental fu presenting with dental pain of the left upper and lower molars. Reports her pain decreased with prescription NSAIDs recently but returned when she ran out. No fevers or chills. No jaw pain. Pain relieved with cold foods. On exam no swelling or concern for abscess. Dental caries present. Ear exam unremarkable. Patient was provided paperwork to obtain discounted dental care for patients without insurance.  Plan: -naproxen -fu with discounted dental care -fu in 2 weeks to reasses

## 2019-10-01 NOTE — Assessment & Plan Note (Signed)
This is chronic and well controlled.   Today's Vitals   10/01/19 1359 10/01/19 1401  BP: 116/71   Pulse: 85   Temp: 98.6 F (37 C)   TempSrc: Oral   SpO2: 98%   Weight: 241 lb 12.8 oz (109.7 kg)   PainSc:  10-Worst pain ever   Body mass index is 37.87 kg/m.  Plan: -continue current medications

## 2019-10-01 NOTE — Assessment & Plan Note (Addendum)
This is chronic and uncontrolled.   Pt reported worsening of depressive sx at last visit secondary to not having been taking her medications. We refilled her meds at last visit but her sx have not improved. She denies active SI. She states her father in law recently passed away which has worsened her sx. She was supposed to follow up with Phineas Semen but slept through her appointment. She has another schedule in 1-2 weeks. Some concern she may not be taking her medications as labs and pharmacy data for other medical conditions indicate she has not been.  Plan: -fu with Phineas Semen -fu in 2 weeks with medications -TSH

## 2019-10-01 NOTE — Patient Instructions (Addendum)
Thank you for coming to your appointment. It was so nice to see you. Today we discussed  Karen [name] was seen today for medication refill and dental pain.  Diagnoses and all orders for this visit:  Type 2 diabetes mellitus with diabetic polyneuropathy, with long-term current use of insulin (HCC) -     POC Hbg A1C -     Ambulatory referral to Chronic Care Management Services -     BMP w Anion Gap (STAT/Sunquest-performed on-site) -     Return in 2 weeks with medications  Hypertension associated with diabetes (HCC)       -     Continue current medicines   Onychomycosis       -     Follow up with podiatry  Pain due to dental caries       -     naproxen (NAPROSYN) 500 MG tablet; Take 1 tablet (500 mg total) by mouth 2 (two) times daily with a meal.  Depression -     TSH -     Follow up with Phineas Semen   If labs were drawn today, I will call you with any abnormal results. Otherwise, please keep up the good work. I look forward to seeing you again soon at your follow up.  Sincerely,  Jenell Milliner, MD

## 2019-10-01 NOTE — Progress Notes (Addendum)
   CC: diabetes  HPI:  Karen [name] is a 44 y.o. female who presents to the Internal Medicine Clinic for diabetes and for follow up of their chronic medical problems. Please see Assessment and Plan for full HPI.  Past Medical History:  Diagnosis Date  . Atypical chest pain    a. Cath 11/2017 - R/LHC showing 30% mid LAD, otherwise no significant disease, EF 55-65%, normal LVEDP, normal heart pressures (no evidence of significant CAD or CHF).  . COPD (chronic obstructive pulmonary disease) (HCC)   . Diabetes mellitus (HCC) 04/17/2017  . Fibromyalgia syndrome 04/17/2017  . GERD (gastroesophageal reflux disease) 04/17/2017  . Hypertension   . Hypothyroidism 04/17/2017  . 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 Obstructive sleep apnea 09/26/2017  . Recurrent syncope   . Tobacco use 11/15/2017   Review of Systems:  Please see Assessment and Plan for full ROS.  Physical Exam:  Vitals:   10/01/19 1359  BP: 116/71  Pulse: 85  Temp: 98.6 F (37 C)  TempSrc: Oral  SpO2: 98%  Weight: 241 lb 12.8 oz (109.7 kg)   Physical Exam  Constitutional: No distress.  HENT:  Head: Normocephalic and atraumatic.  Right Ear: External ear normal.  Left Ear: External ear normal.  Poor dentition, upper and lower dental caries without abscess or pustular drainage  Cardiovascular: Normal rate, regular rhythm and normal heart sounds.  No murmur heard. Pulmonary/Chest: Effort normal and breath sounds normal. No respiratory distress. She has no wheezes. She has no rales.  Abdominal: Soft. Bowel sounds are normal. There is no abdominal tenderness.  Musculoskeletal:        General: Normal range of motion.     Cervical back: Normal range of motion.  Neurological: She is alert.  Skin: She is not diaphoretic.  40mm x 32mm echymoses on left side of L great toe  Psychiatric:  Depressed mood  Nursing note and vitals reviewed.  Assessment & Plan:   See Encounters Tab for problem based charting.  Patient  discussed with Dr. Mikey Bussing

## 2019-10-01 NOTE — Assessment & Plan Note (Signed)
Patient w/ hx of hypothyroidism presenting with severe depression. She reports adherence to her medications including synthroid. Pharmacy filling data in the computer suggests she hasn't picked it up recently or at least has not used insurance to get it.  Plan: -check TSH -fu in 2 weeks with all medications

## 2019-10-03 NOTE — Progress Notes (Signed)
Internal Medicine Clinic Attending  Case discussed with Dr. Darl Pikes at the time of the visit.  We reviewed the resident's history and exam and pertinent patient test results.  I agree with the assessment, diagnosis, and plan of care documented in the resident's note.   Suspect depression is contributed by hypothyroidism given elevated TSH, stress adherence to synthroid and recheck in 4-6 weeks.

## 2019-10-04 ENCOUNTER — Ambulatory Visit: Admitting: Podiatry

## 2019-10-04 ENCOUNTER — Encounter: Payer: Self-pay | Admitting: *Deleted

## 2019-10-10 ENCOUNTER — Ambulatory Visit (INDEPENDENT_AMBULATORY_CARE_PROVIDER_SITE_OTHER): Admitting: Podiatry

## 2019-10-10 ENCOUNTER — Other Ambulatory Visit: Payer: Self-pay

## 2019-10-10 VITALS — Temp 97.1°F

## 2019-10-10 DIAGNOSIS — E1142 Type 2 diabetes mellitus with diabetic polyneuropathy: Secondary | ICD-10-CM

## 2019-10-10 DIAGNOSIS — B351 Tinea unguium: Secondary | ICD-10-CM | POA: Diagnosis not present

## 2019-10-10 DIAGNOSIS — E1169 Type 2 diabetes mellitus with other specified complication: Secondary | ICD-10-CM

## 2019-10-11 ENCOUNTER — Ambulatory Visit: Payer: Self-pay | Admitting: *Deleted

## 2019-10-11 DIAGNOSIS — J449 Chronic obstructive pulmonary disease, unspecified: Secondary | ICD-10-CM

## 2019-10-11 DIAGNOSIS — I5032 Chronic diastolic (congestive) heart failure: Secondary | ICD-10-CM

## 2019-10-11 DIAGNOSIS — E1142 Type 2 diabetes mellitus with diabetic polyneuropathy: Secondary | ICD-10-CM

## 2019-10-11 NOTE — Chronic Care Management (AMB) (Signed)
  Care Management   Outreach Note  10/11/2019 Name: Karen [name] MRN: [id number] DOB: 05-18-1976  Referred by: Jenell Milliner, MD Reason for referral : Care Coordination (HF, DM  HTN, COPD)   An unsuccessful telephone outreach was attempted today. The patient was referred to the case management team for assistance with care management and care coordination. Unable to leave message as recording states voice mailbox has not been set up yet.   Follow Up Plan: The care management team will reach out to the patient again over the next 7 days.   Cranford Mon RN, CCM, [facility] CCM Clinic RN Care Manager [phone]

## 2019-10-16 ENCOUNTER — Encounter: Payer: Self-pay | Admitting: Licensed Clinical Social Worker

## 2019-10-16 ENCOUNTER — Ambulatory Visit: Payer: Self-pay | Admitting: *Deleted

## 2019-10-16 ENCOUNTER — Other Ambulatory Visit: Payer: Self-pay

## 2019-10-16 ENCOUNTER — Ambulatory Visit (INDEPENDENT_AMBULATORY_CARE_PROVIDER_SITE_OTHER): Admitting: Cardiovascular Disease

## 2019-10-16 ENCOUNTER — Encounter: Payer: Self-pay | Admitting: Cardiovascular Disease

## 2019-10-16 ENCOUNTER — Ambulatory Visit (INDEPENDENT_AMBULATORY_CARE_PROVIDER_SITE_OTHER): Admitting: Licensed Clinical Social Worker

## 2019-10-16 VITALS — BP 120/74 | HR 87 | Ht 67.0 in | Wt 240.0 lb

## 2019-10-16 DIAGNOSIS — F3132 Bipolar disorder, current episode depressed, moderate: Secondary | ICD-10-CM

## 2019-10-16 DIAGNOSIS — Z794 Long term (current) use of insulin: Secondary | ICD-10-CM

## 2019-10-16 DIAGNOSIS — I10 Essential (primary) hypertension: Secondary | ICD-10-CM | POA: Diagnosis not present

## 2019-10-16 DIAGNOSIS — I2510 Atherosclerotic heart disease of native coronary artery without angina pectoris: Secondary | ICD-10-CM

## 2019-10-16 DIAGNOSIS — J449 Chronic obstructive pulmonary disease, unspecified: Secondary | ICD-10-CM

## 2019-10-16 DIAGNOSIS — E1159 Type 2 diabetes mellitus with other circulatory complications: Secondary | ICD-10-CM

## 2019-10-16 NOTE — Chronic Care Management (AMB) (Signed)
  Care Management   Note  10/16/2019 Name: Karen [name] MRN: [id number] DOB: December 10, 1975  Met briefly with patient in the clinic after she completed her appointment with Dessie Coma, behavioral health counselor, to introduce the chronic care mangement program and the roles of CCM RN and CCM BSW.   The patient was provided with contact information for the care management team and a [facility] Management calendar and was asked to call this CCM RN to arrange the initial telephone appointment.    Kelli Churn RN, CCM, [facility] RN Care Manager [phone]
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
## 2019-10-16 NOTE — Patient Instructions (Signed)

## 2019-10-16 NOTE — Progress Notes (Signed)
Chief Complaint  Patient presents with  . Follow-up    CAD    History of Present Illness: 44 yo female with history of DM, COPD, GERD, HTN, hypothyroidism, depression, sleep apnea, narcolepsy, fibromyalgia, recurrent syncope, orthostasis and morbid obesity who is here today for follow up. She has been followed in our office by Ronie Spies, PA-C following episodes of atypical chest pain, dyspnea and atypical syncope. Echo May 2019 with LVEF 60-65%, grade 1 diastolic dysfunction. Nuclear stress test 10/18/17 without ischemia. Event monitor in 2019 was normal - she reports presyncope while wearing but no full syncope. Due to persistent dyspnea, she underwent definitive R/LHC showing 30% mid LAD, otherwise no significant disease, EF 55-65%, normal LVEDP, normal heart pressures.   She is here today for follow up. The patient denies any chest pain, dyspnea, palpitations, lower extremity edema, orthopnea, PND, dizziness, near syncope or syncope.   Primary Care Physician: Jenell Milliner, MD  Past Medical History:  Diagnosis Date  . Atypical chest pain    a. Cath 11/2017 - R/LHC showing 30% mid LAD, otherwise no significant disease, EF 55-65%, normal LVEDP, normal heart pressures (no evidence of significant CAD or CHF).  . COPD (chronic obstructive pulmonary disease) (HCC)   . Diabetes mellitus (HCC) 04/17/2017  . Fibromyalgia syndrome 04/17/2017  . GERD (gastroesophageal reflux disease) 04/17/2017  . Hypertension   . Hypothyroidism 04/17/2017  . 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 Obstructive sleep apnea 09/26/2017  . Recurrent syncope   . Tobacco use 11/15/2017    Past Surgical History:  Procedure Laterality Date  . btl    . CHOLECYSTECTOMY    . RIGHT/LEFT HEART CATH AND CORONARY ANGIOGRAPHY N/A 12/07/2017   Procedure: RIGHT/LEFT HEART CATH AND CORONARY ANGIOGRAPHY;  Surgeon: Marykay Lex, MD;  Location: [facility] INVASIVE CV LAB;  Service: Cardiovascular;  Laterality: N/A;  . shoulder  Bilateral     Current Outpatient Medications  Medication Sig Dispense Refill  . albuterol (PROVENTIL HFA;VENTOLIN HFA) 108 (90 Base) MCG/ACT inhaler Inhale 2 puffs into the lungs every 4 (four) hours as needed for wheezing or shortness of breath. 3 Inhaler 3  . amLODipine (NORVASC) 10 MG tablet Take 1 tablet (10 mg total) by mouth daily. 90 tablet 3  . ARIPiprazole (ABILIFY) 10 MG tablet Take 10 mg by mouth at bedtime.    . Blood Glucose Monitoring Suppl (FREESTYLE LITE) DEVI Use to check blood sugar up to 3 times a day 1 each 1  . divalproex (DEPAKOTE) 250 MG DR tablet Take 250 mg by mouth 2 (two) times daily.     . empagliflozin (JARDIANCE) 10 MG TABS tablet Take 10 mg by mouth daily. 90 tablet 3  . exenatide (BYETTA 10 MCG PEN) 10 MCG/0.04ML SOPN injection Inject 0.04 mLs (10 mcg total) into the skin 2 (two) times daily with a meal. 45 pen 3  . FLUoxetine (PROZAC) 20 MG tablet Take 1 tablet (20 mg total) by mouth daily. 90 tablet 3  . gabapentin (NEURONTIN) 300 MG capsule Take 3 capsules (900 mg total) by mouth 2 (two) times daily. 540 capsule 3  . glucose blood (FREESTYLE LITE) test strip Use to check blood sugar 3 times daily. diag code E11.9. insulin dependent 300 each 3  . insulin aspart (NOVOLOG FLEXPEN) 100 UNIT/ML FlexPen Inject 10 Units into the skin 3 (three) times daily with meals. 45 mL 3  . Insulin Detemir (LEVEMIR FLEXPEN) 100 UNIT/ML Pen Inject 40 Units into the skin daily at 10 pm. IM  program 135 mL 3  . Insulin Pen Needle (NOVOFINE PLUS) 32G X 4 MM MISC 1 Dose by Does not apply route 4 (four) times daily. Use with Levemir and Novolog, IM program 390 each 3  . Lancets (FREESTYLE) lancets Use to check blood sugar 3 times daily. diag code E11.9. insulin dependent 300 each 3  . levothyroxine (SYNTHROID, LEVOTHROID) 150 MCG tablet Take 1 tablet (150 mcg total) by mouth daily before breakfast. 90 tablet 3  . lisinopril (PRINIVIL,ZESTRIL) 40 MG tablet Take 1 tablet (40 mg total) by  mouth daily. 90 tablet 3  . lovastatin (MEVACOR) 40 MG tablet Take 1 tablet (40 mg total) by mouth at bedtime. 90 tablet 3  . meclizine (ANTIVERT) 25 MG tablet Take 1 tablet (25 mg total) by mouth 3 (three) times daily as needed for dizziness. 30 tablet 0  . metFORMIN (GLUCOPHAGE) 500 MG tablet Take 2 tablets (1,000 mg total) by mouth 2 (two) times daily with a meal. 360 tablet 3  . methylphenidate (RITALIN) 20 MG tablet Take 1 tablet (20 mg total) by mouth 2 (two) times daily with breakfast and lunch. Take in the morning and at lunch, no later than 3 PM. 60 tablet 0  . montelukast (SINGULAIR) 10 MG tablet Take 1 tablet (10 mg total) by mouth at bedtime. 90 tablet 3  . naproxen (NAPROSYN) 500 MG tablet Take 1 tablet (500 mg total) by mouth 2 (two) times daily with a meal. 42 tablet 0  . omeprazole (PRILOSEC) 40 MG capsule Take 1 capsule (40 mg total) by mouth 2 (two) times daily at 8 am and 10 pm. 180 capsule 3  . ondansetron (ZOFRAN-ODT) 4 MG disintegrating tablet Take 1 tablet (4 mg total) by mouth every 8 (eight) hours as needed for nausea or vomiting. 15 tablet 0  . prazosin (MINIPRESS) 1 MG capsule Take 1 mg by mouth at bedtime.    . sucralfate (CARAFATE) 1 g tablet Take 1 tablet (1 g total) by mouth 4 (four) times daily -  with meals and at bedtime for 7 days. (Patient taking differently: Take 1 g by mouth daily. ) 28 tablet 0  . triamcinolone cream (KENALOG) 0.1 % Apply 1 application topically 2 (two) times daily. 30 g 0  . vitamin E 400 UNIT capsule Take 1 capsule (400 Units total) by mouth daily. 30 capsule 0  . fluticasone (FLONASE) 50 MCG/ACT nasal spray Place 1-2 sprays into both nostrils daily for 7 days. 1 g 0   No current facility-administered medications for this visit.    Allergies  Allergen Reactions  . Aspirin Other (See Comments)    Was told by her mother not to take it in the past but was put on it a few years ago and did fine without any adverse effect  . Tylenol  [Acetaminophen]     Does not take due to fatty liver disease    Social History   Socioeconomic History  . Marital status: Married    Spouse name: Not on file  . Number of children: Not on file  . Years of education: Not on file  . Highest education level: Not on file  Occupational History  . Not on file  Tobacco Use  . Smoking status: Former Smoker    Packs/day: 0.30    Types: Cigarettes    Quit date: 10/29/2017    Years since quitting: 1.9  . Smokeless tobacco: Never Used  Substance and Sexual Activity  . Alcohol use: Yes  Comment: rarely  . Drug use: No  . Sexual activity: Yes    Birth control/protection: Surgical  Other Topics Concern  . Not on file  Social History Narrative  . Not on file   Social Determinants of Health   Financial Resource Strain:   . Difficulty of Paying Living Expenses:   Food Insecurity:   . Worried About Charity fundraiser in the Last Year:   . Arboriculturist in the Last Year:   Transportation Needs:   . Film/video editor (Medical):   Marland Kitchen Lack of Transportation (Non-Medical):   Physical Activity:   . Days of Exercise per Week:   . Minutes of Exercise per Session:   Stress:   . Feeling of Stress :   Social Connections:   . Frequency of Communication with Friends and Family:   . Frequency of Social Gatherings with Friends and Family:   . Attends Religious Services:   . Active Member of Clubs or Organizations:   . Attends Archivist Meetings:   Marland Kitchen Marital Status:   Intimate Partner Violence:   . Fear of Current or Ex-Partner:   . Emotionally Abused:   Marland Kitchen Physically Abused:   . Sexually Abused:     Family History  Problem Relation Age of Onset  . Hypertension Mother   . Syncope episode Mother   . Heart disease Maternal Grandmother        unclear details    Review of Systems:  As stated in the HPI and otherwise negative.   BP 120/74   Pulse 87   Ht 5\' 7"  (1.702 m)   Wt 240 lb (108.9 kg)   SpO2 96%   BMI  37.59 kg/m   Physical Examination: General: Well developed, well nourished, NAD  HEENT: OP clear, mucus membranes moist  SKIN: warm, dry. No rashes. Neuro: No focal deficits  Musculoskeletal: Muscle strength 5/5 all ext  Psychiatric: Mood and affect normal  Neck: No JVD, no carotid bruits, no thyromegaly, no lymphadenopathy.  Lungs:Clear bilaterally, no wheezes, rhonci, crackles Cardiovascular: Regular rate and rhythm. No murmurs, gallops or rubs. Abdomen:Soft. Bowel sounds present. Non-tender.  Extremities: No lower extremity edema. Pulses are 2 + in the bilateral DP/PT.  EKG:  EKG is ordered today. The ekg ordered today demonstrates sinus  Recent Labs: 10/01/2019: BUN 6; Creatinine, Ser 0.84; Potassium 3.9; Sodium 131; TSH 49.287   Lipid Panel No results found for: CHOL, TRIG, HDL, CHOLHDL, VLDL, LDLCALC, LDLDIRECT   Wt Readings from Last 3 Encounters:  10/16/19 240 lb (108.9 kg)  10/01/19 241 lb 12.8 oz (109.7 kg)  08/27/19 248 lb 4.8 oz (112.6 kg)    Assessment and Plan:   1. CAD without angina: She has had atypical chest pain for several years. Normal in stress test in 2019. LV function normal by echo and cath in 2019. Cardiac cath in 2019 with 30% mid LAD stenosis. Continue statin. She is not on an ASA due to possible prior side effects.   2. HTN: BP controlled. Continue current therapy  Current medicines are reviewed at length with the patient today.  The patient does not have concerns regarding medicines.  The following changes have been made:  no change  Labs/ tests ordered today include:   Orders Placed This Encounter  Procedures  . EKG 12-Lead   Disposition:   FU with me in one year.   Signed, Lauree Chandler, MD 10/16/2019 3:39 PM    [facility]  [address], [place], [place]  [address] Phone: [phone]; Fax: [phone]

## 2019-10-18 ENCOUNTER — Other Ambulatory Visit: Payer: Self-pay | Admitting: Neurology

## 2019-10-18 MED ORDER — METHYLPHENIDATE HCL 20 MG PO TABS: 20.0000 mg | ORAL_TABLET | Freq: Two times a day (BID) | ORAL | 0 refills | Status: DC

## 2019-10-22 ENCOUNTER — Ambulatory Visit: Admitting: Licensed Clinical Social Worker

## 2019-10-22 ENCOUNTER — Telehealth: Payer: Self-pay

## 2019-10-22 ENCOUNTER — Other Ambulatory Visit: Payer: Self-pay | Admitting: Radiation Oncology

## 2019-10-22 ENCOUNTER — Encounter: Admitting: Radiation Oncology

## 2019-10-22 DIAGNOSIS — E118 Type 2 diabetes mellitus with unspecified complications: Secondary | ICD-10-CM

## 2019-10-22 DIAGNOSIS — I10 Essential (primary) hypertension: Secondary | ICD-10-CM

## 2019-10-22 DIAGNOSIS — K219 Gastro-esophageal reflux disease without esophagitis: Secondary | ICD-10-CM

## 2019-10-22 DIAGNOSIS — J449 Chronic obstructive pulmonary disease, unspecified: Secondary | ICD-10-CM

## 2019-10-22 DIAGNOSIS — E039 Hypothyroidism, unspecified: Secondary | ICD-10-CM

## 2019-10-22 DIAGNOSIS — K029 Dental caries, unspecified: Secondary | ICD-10-CM

## 2019-10-22 NOTE — Telephone Encounter (Signed)
Please call pt back.

## 2019-10-22 NOTE — Telephone Encounter (Signed)
Refill Request-Pt states she called her pharmacy and was told all of her prescriptions needed to be refilled.  [facility] - [place], [place] - [address]     albuterol (PROVENTIL HFA;VENTOLIN HFA) 108 (90 Base) MCG/ACT inhaler    amLODipine (NORVASC) 10 MG tablet    ARIPiprazole (ABILIFY) 10 MG tablet    Blood Glucose Monitoring Suppl (FREESTYLE LITE) DEVI    divalproex (DEPAKOTE) 250 MG DR tablet    empagliflozin (JARDIANCE) 10 MG TABS tablet    exenatide (BYETTA 10 MCG PEN) 10 MCG/0.04ML SOPN injection    FLUoxetine (PROZAC) 20 MG tablet    fluticasone (FLONASE) 50 MCG/ACT nasal spray(Expired)    gabapentin (NEURONTIN) 300 MG capsule    glucose blood (FREESTYLE LITE) test strip    insulin aspart (NOVOLOG FLEXPEN) 100 UNIT/ML FlexPen    Insulin Detemir (LEVEMIR FLEXPEN) 100 UNIT/ML Pen    Insulin Pen Needle (NOVOFINE PLUS) 32G X 4 MM MISC    Lancets (FREESTYLE) lancets    levothyroxine (SYNTHROID, LEVOTHROID) 150 MCG tablet    lisinopril (PRINIVIL,ZESTRIL) 40 MG tablet    lovastatin (MEVACOR) 40 MG tablet    meclizine (ANTIVERT) 25 MG tablet    metFORMIN (GLUCOPHAGE) 500 MG tablet    methylphenidate (RITALIN) 20 MG tablet    montelukast (SINGULAIR) 10 MG tablet    naproxen (NAPROSYN) 500 MG tablet    omeprazole (PRILOSEC) 40 MG capsule    ondansetron (ZOFRAN-ODT) 4 MG disintegrating tablet    prazosin (MINIPRESS) 1 MG capsule    sucralfate (CARAFATE) 1 g tablet    triamcinolone cream (KENALOG) 0.1 %    vitamin E 400 UNIT capsule

## 2019-10-23 ENCOUNTER — Ambulatory Visit: Admitting: *Deleted

## 2019-10-23 DIAGNOSIS — J449 Chronic obstructive pulmonary disease, unspecified: Secondary | ICD-10-CM

## 2019-10-23 DIAGNOSIS — E1142 Type 2 diabetes mellitus with diabetic polyneuropathy: Secondary | ICD-10-CM

## 2019-10-23 DIAGNOSIS — I5032 Chronic diastolic (congestive) heart failure: Secondary | ICD-10-CM

## 2019-10-23 NOTE — Telephone Encounter (Signed)
I didn't see the usual prompts to refill all of these.

## 2019-10-24 ENCOUNTER — Ambulatory Visit: Admitting: *Deleted

## 2019-10-24 DIAGNOSIS — E1159 Type 2 diabetes mellitus with other circulatory complications: Secondary | ICD-10-CM

## 2019-10-24 DIAGNOSIS — E1142 Type 2 diabetes mellitus with diabetic polyneuropathy: Secondary | ICD-10-CM

## 2019-10-24 DIAGNOSIS — I5032 Chronic diastolic (congestive) heart failure: Secondary | ICD-10-CM

## 2019-10-24 DIAGNOSIS — H903 Sensorineural hearing loss, bilateral: Secondary | ICD-10-CM | POA: Insufficient documentation

## 2019-10-24 MED ORDER — MECLIZINE HCL 25 MG PO TABS: 25.0000 mg | ORAL_TABLET | Freq: Three times a day (TID) | ORAL | 0 refills | Status: AC | PRN

## 2019-10-24 MED ORDER — EMPAGLIFLOZIN 10 MG PO TABS: 10.0000 mg | ORAL_TABLET | Freq: Every day | ORAL | 3 refills | Status: AC

## 2019-10-24 MED ORDER — SUCRALFATE 1 G PO TABS: 1.0000 g | ORAL_TABLET | Freq: Three times a day (TID) | ORAL | 0 refills | Status: DC

## 2019-10-24 MED ORDER — ONDANSETRON 4 MG PO TBDP: 4.0000 mg | ORAL_TABLET | Freq: Three times a day (TID) | ORAL | 0 refills | Status: AC | PRN

## 2019-10-24 MED ORDER — LOVASTATIN 40 MG PO TABS: 40.0000 mg | ORAL_TABLET | Freq: Every day | ORAL | 3 refills | Status: AC

## 2019-10-24 MED ORDER — ALBUTEROL SULFATE HFA 108 (90 BASE) MCG/ACT IN AERS: 2.0000 | INHALATION_SPRAY | RESPIRATORY_TRACT | 2 refills | Status: AC | PRN

## 2019-10-24 MED ORDER — MONTELUKAST SODIUM 10 MG PO TABS: 10.0000 mg | ORAL_TABLET | Freq: Every day | ORAL | 3 refills | Status: AC

## 2019-10-24 MED ORDER — TRIAMCINOLONE ACETONIDE 0.1 % EX CREA: 1.0000 "application " | TOPICAL_CREAM | Freq: Two times a day (BID) | CUTANEOUS | 0 refills | Status: AC

## 2019-10-24 MED ORDER — NOVOFINE PLUS 32G X 4 MM MISC: 1.0000 | Freq: Four times a day (QID) | 3 refills | Status: AC

## 2019-10-24 MED ORDER — FREESTYLE LANCETS MISC: 3 refills | Status: AC

## 2019-10-24 MED ORDER — METHYLPHENIDATE HCL 20 MG PO TABS: 20.0000 mg | ORAL_TABLET | Freq: Two times a day (BID) | ORAL | 0 refills | Status: DC

## 2019-10-24 MED ORDER — OMEPRAZOLE 40 MG PO CPDR: 40.0000 mg | DELAYED_RELEASE_CAPSULE | Freq: Two times a day (BID) | ORAL | 3 refills | Status: AC

## 2019-10-24 MED ORDER — GABAPENTIN 300 MG PO CAPS: 900.0000 mg | ORAL_CAPSULE | Freq: Two times a day (BID) | ORAL | 3 refills | Status: AC

## 2019-10-24 MED ORDER — FLUOXETINE HCL 20 MG PO TABS: 20.0000 mg | ORAL_TABLET | Freq: Every day | ORAL | 3 refills | Status: AC

## 2019-10-24 MED ORDER — AMLODIPINE BESYLATE 10 MG PO TABS: 10.0000 mg | ORAL_TABLET | Freq: Every day | ORAL | 3 refills | Status: AC

## 2019-10-24 MED ORDER — FREESTYLE LITE TEST VI STRP: ORAL_STRIP | 3 refills | Status: AC

## 2019-10-24 MED ORDER — METFORMIN HCL 500 MG PO TABS: 1000.0000 mg | ORAL_TABLET | Freq: Two times a day (BID) | ORAL | 3 refills | Status: AC

## 2019-10-24 MED ORDER — ARIPIPRAZOLE 10 MG PO TABS: 10.0000 mg | ORAL_TABLET | Freq: Every day | ORAL | 3 refills | Status: AC

## 2019-10-24 MED ORDER — BYETTA 10 MCG PEN 10 MCG/0.04ML ~~LOC~~ SOPN: 10.0000 ug | PEN_INJECTOR | Freq: Two times a day (BID) | SUBCUTANEOUS | 3 refills | Status: AC

## 2019-10-24 MED ORDER — NOVOLOG FLEXPEN 100 UNIT/ML ~~LOC~~ SOPN: 10.0000 [IU] | PEN_INJECTOR | Freq: Three times a day (TID) | SUBCUTANEOUS | 3 refills | Status: AC

## 2019-10-24 MED ORDER — LEVOTHYROXINE SODIUM 150 MCG PO TABS: 150.0000 ug | ORAL_TABLET | Freq: Every day | ORAL | 3 refills | Status: AC

## 2019-10-24 MED ORDER — NAPROXEN 500 MG PO TABS: 500.0000 mg | ORAL_TABLET | Freq: Two times a day (BID) | ORAL | 0 refills | Status: DC

## 2019-10-24 MED ORDER — FREESTYLE LITE DEVI: 1 refills | Status: AC

## 2019-10-24 MED ORDER — VITAMIN E 180 MG (400 UNIT) PO CAPS: 400.0000 [IU] | ORAL_CAPSULE | Freq: Every day | ORAL | 0 refills | Status: DC

## 2019-10-24 MED ORDER — DIVALPROEX SODIUM 250 MG PO DR TAB: 250.0000 mg | DELAYED_RELEASE_TABLET | Freq: Two times a day (BID) | ORAL | 3 refills | Status: AC

## 2019-10-24 MED ORDER — PRAZOSIN HCL 1 MG PO CAPS: 1.0000 mg | ORAL_CAPSULE | Freq: Every day | ORAL | 3 refills | Status: AC

## 2019-10-24 MED ORDER — FLUTICASONE PROPIONATE 50 MCG/ACT NA SUSP: 1.0000 | Freq: Every day | NASAL | 0 refills | Status: AC

## 2019-10-24 MED ORDER — LISINOPRIL 40 MG PO TABS: 40.0000 mg | ORAL_TABLET | Freq: Every day | ORAL | 3 refills | Status: AC

## 2019-10-24 MED ORDER — LEVEMIR FLEXPEN 100 UNIT/ML ~~LOC~~ SOPN: 40.0000 [IU] | PEN_INJECTOR | Freq: Every day | SUBCUTANEOUS | 3 refills | Status: AC

## 2019-10-24 NOTE — Chronic Care Management (AMB) (Signed)
  Care Management   Note  10/23/2019 Name: Karen [name] MRN: [id number] DOB: May 12, 1976  Successful outreach to patient to schedule initial telephone intake.  Telephone follow up appointment with care management team member scheduled for:10/30/19 at 3:30 pm Per patient request will mail [facility] Care Management spiral bound calendar to patient's home address.   Cranford Mon RN, CCM, [facility] CCM Clinic RN Care Manager [phone]

## 2019-10-24 NOTE — Chronic Care Management (AMB) (Signed)
  Care Management   Note  10/24/2019 Name: Karen [name] MRN: [id number] DOB: [birth year]  Patient called and states she is just about out of all of her medicines and needs refills called to [facility] as soon as possible because she receives all of medications  via mail order.  Urgent message to Dr. Darl Pikes and refills were called; patient notified via phone.  The care management team will reach out to the patient again over the next 7 days.  Telephone follow up appointment with care management team member scheduled for: 10/30/19 at 3:30 pm. Per patient request will mail her two [facility] Care Management spiral bound calendars with action plans for HTN, DM and HF and log sheets for weights, blood pressure and blood sugars.  Cranford Mon RN, CCM, CDC[facility] CCM Clinic RN Care Manager [phone]

## 2019-10-24 NOTE — Addendum Note (Signed)
Addended by: Hassan Buckler on: 10/24/2019 10:51 AM   Modules accepted: Orders

## 2019-10-24 NOTE — Telephone Encounter (Signed)
Please re-send electronically.  Thanks 

## 2019-10-30 ENCOUNTER — Ambulatory Visit: Admitting: *Deleted

## 2019-10-30 DIAGNOSIS — Z794 Long term (current) use of insulin: Secondary | ICD-10-CM

## 2019-10-30 DIAGNOSIS — J449 Chronic obstructive pulmonary disease, unspecified: Secondary | ICD-10-CM

## 2019-10-30 DIAGNOSIS — F310 Bipolar disorder, current episode hypomanic: Secondary | ICD-10-CM

## 2019-10-30 NOTE — Patient Instructions (Signed)
Visit Information It was nice speaking with you today You can pick your medication boxes up at the front desk of the clinic  Goals Addressed            This Visit's Progress     Patient Stated   . " I need more information before I can decide to take the Covid vaccine." (pt-stated)       CARE PLAN ENTRY (see longitudinal plan of care for additional care plan information)  Current Barriers:  Karen [name] Knowledge Deficits related to Covid vaccine risks and benefits - patient states she is very fearful of contracting the Covid virus but is undecided about receiving the vaccine  Nurse Case Manager Clinical Goal(s):  Karen [name] Over the next 30 days, patient will verbalize understanding of plan to make informed decision about receiving Covid vaccine  Interventions:  . Inter-disciplinary care team collaboration (see longitudinal plan of care) . Advised patient with her comorbdidites she is at greater risk for more serious Covid infection but not at higher risk to contract the virus . Provided education to patient- with patient's permission emailed patient [facility] article "Covid 19 - Get The Facts"  Patient Self Care Activities:  . Patient verbalizes understanding of plan to educate self regarding Covid vaccine . Unable to independently make decision regarding receiving Covid vaccine  Initial goal documentation     . " I'm an emotional eater and I would like help." (pt-stated)       CARE PLAN ENTRY (see longitudinal plan of care for additional care plan information)  Current Barriers:  Karen [name] Knowledge Deficits related to strategies to deal with emotional eating  Nurse Case Manager Clinical Goal(s):  Karen [name] Over the next 30 days, patient will work with clinic RD to address needs related to emotional eating  Interventions:  . Inter-disciplinary care team collaboration (see longitudinal plan of care) . Collaborated with RD regarding referral to assist patient with emotional eating  Patient Self Care  Activities:  . Patient verbalizes understanding of plan to work with RD to address emotional eating . Unable to independently manage emotional eating  Initial goal documentation     . "I don't check my blood sugars because I don't like to stick my fingers." (pt-stated)       CARE PLAN ENTRY (see longitudinal plan of care for additional care plan information)  Current Barriers:  . Chronic Disease Management support and education needs related to Type 2 DM - patient states she takes her medications as prescribed, walks with friends at [place] every other day but struggles with emotional eating especially since her separating from her husband of 3 years "on and off", she says she does not monitor her blood sugars because she does not like to stick her fingers, she says she wants to use a sensor so she can eliminate finger sticks but doesn't know the qualifying criteria  Nurse Case Manager Clinical Goal(s):  Karen [name] Over the next 30 days, patient will work with Medical illustrator to address option of Freestyle Libre Pro  Interventions:  . Inter-disciplinary care team collaboration (see longitudinal plan of care) . Discussed qualifying criteria for a continuous glucose monitor . Educated patient to [employer] . Advised patient to call [facility] customer service to determine if she has benefits to cover [facility].  . Provided patient with 2 medication boxes to assist with medication taking behavior  Patient Self Care Activities:  . Patient verbalizes understanding of plan to call [facility] customer service regarding Freestyle Libre Pro benefit . Unable to independently pursue option of Freestyle Libre Pro system  Initial goal documentation     . "I'm not working and separated from husband on and off so I'm having trouble paying for everything." (pt-stated)       [place] (see longitudinal plan of care for additional care plan information)  Current Barriers:  . 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   Interventions:  . Inter-disciplinary care team collaboration (see longitudinal plan of care) . Assessed SDOH related to food and financial insecurity, stress, social connections and transportation . Care Guide referral for financial strain and food insecurity . Social Work referral for financial strain   Patient Self Care Activities:  . Patient verbalizes understanding of plan to work with BSW and Care Guide to address financial strain issues . Unable to independently determine resources to address financial strain issues  and food insecurity  Initial goal documentation        Ms. Moore was given information about Care Management services today including:  1. Care Management services include personalized support from designated clinical staff supervised by her physician, including individualized plan of care and coordination with other care providers 2. 24/7 contact phone numbers for assistance for urgent and routine care needs. 3. The patient may stop CCM services at any time (effective at the end of the month) by phone call to the office staff.  Patient agreed to services and verbal consent obtained.   The patient verbalized understanding of instructions provided today and declined a print copy of patient instruction materials.   The care management team will reach out to the patient again over the next 30 days.   Kelli Churn RN, CCM, [facility] RN  Care Manager [phone]

## 2019-10-30 NOTE — Chronic Care Management (AMB) (Signed)
Care Management   Initial Visit Note  10/30/2019 Name: Karen [name] MRN: [id number] DOB: 03/13/1976  Subjective: See Care Plan  Objective: Wt Readings from Last 3 Encounters:  10/16/19 240 lb (108.9 kg)  10/01/19 241 lb 12.8 oz (109.7 kg)  08/27/19 248 lb 4.8 oz (112.6 kg)   BP Readings from Last 3 Encounters:  10/16/19 120/74  10/01/19 116/71  09/08/19 134/78   Lab Results  Component Value Date   HGBA1C 12.2 (A) 10/01/2019   HGBA1C 11.8 (A) 06/04/2019   HGBA1C 7.6 (A) 05/15/2018   Lab Results  Component Value Date   CREATININE 0.84 10/01/2019    No results found for: LABMICR, MICROALBUR Needs yearly urine for protein   Assessment: Karen [name] is a 44 y.o. year old female who sees Jenell Milliner, MD for primary care. The care management team was consulted for assistance with care management and care coordination needs related to Disease Management,Educational Needs,Care Coordination.   Review of patient status, including review of consultants reports, relevant laboratory and other test results, and collaboration with appropriate care team members and the patient's provider was performed as part of comprehensive patient evaluation and provision of care management services.    SDOH (Social Determinants of Health) assessments performed: Yes See Care Plan activities for detailed interventions related to SDOH)  SDOH Interventions     Most Recent Value  SDOH Interventions  Food Insecurity Interventions  Other (Comment) [referred to care guide and BSw]  Financial Strain Interventions  Other (Comment) [referred to care guide and BSW]  Housing Interventions  Other (Comment) [referred to care guide and BSW]  Physical Activity Interventions  Intervention Not Indicated  Stress Interventions  Other (Comment) [patient sees Lysle Rubens [facility] clinic counselor]       Outpatient Encounter Medications as of 10/30/2019  Medication Sig Note  . albuterol (VENTOLIN HFA) 108 (90 Base)  MCG/ACT inhaler Inhale 2 puffs into the lungs every 4 (four) hours as needed for wheezing or shortness of breath. 10/30/2019: Sometimes 4-5 times week depending on the trigger such as crying, strenuous activity  . ARIPiprazole (ABILIFY) 10 MG tablet Take 1 tablet (10 mg total) by mouth at bedtime.   . divalproex (DEPAKOTE) 250 MG DR tablet Take 1 tablet (250 mg total) by mouth 2 (two) times daily.   . empagliflozin (JARDIANCE) 10 MG TABS tablet Take 1 tablet (10 mg total) by mouth daily.   Marland Kitchen exenatide (BYETTA 10 MCG PEN) 10 MCG/0.04ML SOPN injection Inject 0.04 mLs (10 mcg total) into the skin 2 (two) times daily with a meal.   . FLUoxetine (PROZAC) 20 MG tablet Take 1 tablet (20 mg total) by mouth daily.   . insulin aspart (NOVOLOG FLEXPEN) 100 UNIT/ML FlexPen Inject 10 Units into the skin 3 (three) times daily with meals.   . insulin detemir (LEVEMIR FLEXPEN) 100 UNIT/ML FlexPen Inject 40 Units into the skin daily at 10 pm. IM program   . metFORMIN (GLUCOPHAGE) 500 MG tablet Take 2 tablets (1,000 mg total) by mouth 2 (two) times daily with a meal.   . amLODipine (NORVASC) 10 MG tablet Take 1 tablet (10 mg total) by mouth daily.   . Blood Glucose Monitoring Suppl (FREESTYLE LITE) DEVI Use to check blood sugar up to 3 times a day   . fluticasone (FLONASE) 50 MCG/ACT nasal spray Place 1-2 sprays into both nostrils daily for 7 days.   Marland Kitchen gabapentin (NEURONTIN) 300 MG capsule Take 3 capsules (900 mg total) by mouth 2 (two) times  daily.   . glucose blood (FREESTYLE LITE) test strip Use to check blood sugar 3 times daily. diag code E11.9. insulin dependent   . Insulin Pen Needle (NOVOFINE PLUS) 32G X 4 MM MISC 1 Dose by Does not apply route 4 (four) times daily. Use with Levemir and Novolog, IM program   . Lancets (FREESTYLE) lancets Use to check blood sugar 3 times daily. diag code E11.9. insulin dependent   . levothyroxine (SYNTHROID) 150 MCG tablet Take 1 tablet (150 mcg total) by mouth daily before  breakfast.   . lisinopril (ZESTRIL) 40 MG tablet Take 1 tablet (40 mg total) by mouth daily.   Marland Kitchen lovastatin (MEVACOR) 40 MG tablet Take 1 tablet (40 mg total) by mouth at bedtime.   . meclizine (ANTIVERT) 25 MG tablet Take 1 tablet (25 mg total) by mouth 3 (three) times daily as needed for dizziness.   . methylphenidate (RITALIN) 20 MG tablet Take 1 tablet (20 mg total) by mouth 2 (two) times daily with breakfast and lunch. Take in the morning and at lunch, no later than 3 PM.   . montelukast (SINGULAIR) 10 MG tablet Take 1 tablet (10 mg total) by mouth at bedtime.   . naproxen (NAPROSYN) 500 MG tablet Take 1 tablet (500 mg total) by mouth 2 (two) times daily with a meal.   . omeprazole (PRILOSEC) 40 MG capsule Take 1 capsule (40 mg total) by mouth 2 (two) times daily at 8 am and 10 pm.   . ondansetron (ZOFRAN-ODT) 4 MG disintegrating tablet Take 1 tablet (4 mg total) by mouth every 8 (eight) hours as needed for nausea or vomiting.   . prazosin (MINIPRESS) 1 MG capsule Take 1 capsule (1 mg total) by mouth at bedtime.   . sucralfate (CARAFATE) 1 g tablet Take 1 tablet (1 g total) by mouth 4 (four) times daily -  with meals and at bedtime for 7 days.   Marland Kitchen triamcinolone cream (KENALOG) 0.1 % Apply 1 application topically 2 (two) times daily.   . vitamin E 180 MG (400 UNITS) capsule Take 1 capsule (400 Units total) by mouth daily.    No facility-administered encounter medications on file as of 10/30/2019.   Goals Addressed            This Visit's Progress     Patient Stated   . " I need more information before I can decide to take the Covid vaccine." (pt-stated)       CARE PLAN ENTRY (see longitudinal plan of care for additional care plan information)  Current Barriers:  Marland Kitchen Knowledge Deficits related to Covid vaccine risks and benefits - patient states she is very fearful of contracting the Covid virus but is undecided about receiving the vaccine  Nurse Case Manager Clinical Goal(s):  Marland Kitchen Over  the next 30 days, patient will verbalize understanding of plan to make informed decision about receiving Covid vaccine  Interventions:  . Inter-disciplinary care team collaboration (see longitudinal plan of care) . Advised patient with her comorbdidites she is at greater risk for more serious Covid infection but not at higher risk to contract the virus . Provided education to patient- with patient's permission emailed patient [facility] article "Covid 19 - Get The Facts"  Patient Self Care Activities:  . Patient verbalizes understanding of plan to educate self regarding Covid vaccine . Unable to independently make decision regarding receiving Covid vaccine  Initial goal documentation     . " I'm an emotional eater and I  would like help." (pt-stated)       CARE PLAN ENTRY (see longitudinal plan of care for additional care plan information)  Current Barriers:  Marland Kitchen Knowledge Deficits related to strategies to deal with emotional eating  Nurse Case Manager Clinical Goal(s):  Marland Kitchen Over the next 30 days, patient will work with clinic RD to address needs related to emotional eating  Interventions:  . Inter-disciplinary care team collaboration (see longitudinal plan of care) . Messaged provider requesting referral to RD to address patient's emotional eating issues . Collaborated with RD regarding referral to assist patient with emotional eating  Patient Self Care Activities:  . Patient verbalizes understanding of plan to work with RD to address emotional eating . Unable to independently manage emotional eating  Initial goal documentation     . "I don't check my blood sugars because I don't like to stick my fingers." (pt-stated)       CARE PLAN ENTRY (see longitudinal plan of care for additional care plan information)  Current Barriers:  . Chronic Disease Management support and education needs related to Type 2 DM - patient states she takes her medications as prescribed, walks with friends  at [place] every other day but struggles with emotional eating especially since her separating from her husband of 3 years "on and off", she says she does not monitor her blood sugars because she does not like to stick her fingers, she says she wants to use a sensor so she can eliminate finger sticks but doesn't know the qualifying criteria  Nurse Case Manager Clinical Goal(s):  Marland Kitchen Over the next 30 days, patient will work with Consulting civil engineer to address option of Freestyle Libre Pro  Interventions:  . Inter-disciplinary care team collaboration (see longitudinal plan of care) . Discussed qualifying criteria for a continuous glucose monitor . Educated patient to [employer] . Advised patient to call [place] customer service to determine if she has benefits to cover [facility].  . Provided patient with 2 medication boxes to assist with medication taking behavior  Patient Self Care Activities:  . Patient verbalizes understanding of plan to call [place] customer service regarding [facility] Pro benefit . Unable to independently pursue option of Freestyle Libre Pro system  Initial goal documentation     . "I'm not working and separated from husband on and off so I'm having trouble paying for everything." (pt-stated)       [place] (see longitudinal plan of care for additional care plan information)  Current Barriers:  . 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    Interventions:  . Inter-disciplinary care team collaboration (see longitudinal plan of care) . Assessed SDOH related to food and financial insecurity, stress, social connections and transportation . Care Guide referral for financial strain and food insecurity . Social Work referral for financial strain   Patient Self Care Activities:  . Patient verbalizes understanding of plan to work with BSW and Care Guide to address  financial strain issues . Unable to independently determine resources to address financial strain issues  and food insecurity  Initial goal documentation        Follow up plan:  The care management team will reach out to the patient again over the next 30 days.   Ms. Reach was given information about Care Management services today including:  1. Care Management services include personalized support from designated clinical staff supervised by a physician, including individualized plan of care and coordination with other care providers 2. 24/7 contact phone numbers for assistance for urgent and routine care needs. 3. The patient may stop Care Management services at any time (effective at the end of the month) by phone call to the office staff.  Patient agreed to services and verbal consent obtained.  Cranford Mon RN, CCM, [facility] CCM Clinic RN Care Manager [phone]

## 2019-10-31 NOTE — Addendum Note (Signed)
Addended by: Kirt Boys on: 10/31/2019 05:27 PM   Modules accepted: Orders

## 2019-10-31 NOTE — Progress Notes (Addendum)
Internal Medicine Clinic Resident  Plan:  -Urine microabuminuria/cr ratio ordered for yearly screening   I have personally reviewed this encounter including the documentation in this note and/or discussed this patient with the care management provider. I will address any urgent items identified by the care management provider and will communicate my actions to the patient's PCP. I have reviewed the patient's CCM visit with my supervising attending, Dr Rogelia Boga.  Kirt Boys, MD 10/31/2019

## 2019-11-05 ENCOUNTER — Other Ambulatory Visit: Payer: Self-pay

## 2019-11-05 ENCOUNTER — Encounter: Payer: Self-pay | Admitting: Radiation Oncology

## 2019-11-05 ENCOUNTER — Ambulatory Visit (INDEPENDENT_AMBULATORY_CARE_PROVIDER_SITE_OTHER): Admitting: Radiation Oncology

## 2019-11-05 VITALS — BP 143/83 | HR 108 | Temp 98.8°F | Wt 255.4 lb

## 2019-11-05 DIAGNOSIS — F319 Bipolar disorder, unspecified: Secondary | ICD-10-CM | POA: Diagnosis not present

## 2019-11-05 DIAGNOSIS — E039 Hypothyroidism, unspecified: Secondary | ICD-10-CM

## 2019-11-05 DIAGNOSIS — F310 Bipolar disorder, current episode hypomanic: Secondary | ICD-10-CM

## 2019-11-05 NOTE — Patient Instructions (Signed)
Thank you for coming to your appointment. It was so nice to see you. Today we discussed  Diagnoses and all orders for this visit:  Depression -continue current medicines -follow up with Phineas Semen  Hypothyroidism -Get thyroid labs  Toe Pain and Bruising -Follow up with your podiatrist at your previously scheduled appointment   If labs were drawn today, I will call you with any abnormal results. Otherwise, please keep up the good work. Please come back in 2 months for a diabetes check up.  Sincerely,  Jenell Milliner, MD

## 2019-11-05 NOTE — Assessment & Plan Note (Signed)
This is chronic and uncontrolled.  Patient with hx of hypothyroidism on synthroid has been reporting uncontrolled depression as well as a sluggish feeling. Unclear if patient was taking her synthroid. Refilled prescription for her at last visit. TSH checked at last visit elevated at around 50 concerning for active hypothyroidism. Patient has been taking her synthroid since it was refilled at our last visit 4 weeks ago. Patient reports some improvement in her depression.  Plan: -recheck TSH today, if elevated, will increase synthroid dose

## 2019-11-05 NOTE — Assessment & Plan Note (Signed)
This is chronic.  Patient with multiple recent visits for her worsening depression secondary to having run out of her prescriptions. Patient has had all of her medications refilled and has followed up with Phineas Semen for counseling and reports feeling somewhat better today. We also refilled her synthroid and it appears the pt has been taking this. There was some concern that hypothyroidism was contributing to her depression and TSH checked at last visit was elevated at 50. Patient reports adherence to her psych and thyroid meds.  Plan: -continue current medications -TSH today  -fu w/ Phineas Semen

## 2019-11-05 NOTE — Progress Notes (Signed)
   CC: depression  HPI:  [name] is a 44 y.o. female who presents to the Internal Medicine Clinic for follow up of her depression and other chronic medical problems. Please see Assessment and Plan for full HPI.  Past Medical History:  Diagnosis Date  . Atypical chest pain    a. Cath 11/2017 - R/LHC showing 30% mid LAD, otherwise no significant disease, EF 55-65%, normal LVEDP, normal heart pressures (no evidence of significant CAD or CHF).  . COPD (chronic obstructive pulmonary disease) (HCC)   . Diabetes mellitus (HCC) 04/17/2017  . Fibromyalgia syndrome 04/17/2017  . GERD (gastroesophageal reflux disease) 04/17/2017  . Hypertension   . Hypothyroidism 04/17/2017  . 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 Obstructive sleep apnea 09/26/2017  . Recurrent syncope   . Tobacco use 11/15/2017   Review of Systems:  Please see Assessment and Plan for full ROS.  Physical Exam:  Vitals:   11/05/19 1402  BP: (!) 143/83  Pulse: (!) 108  Temp: 98.8 F (37.1 C)  SpO2: 99%  Weight: 255 lb 6.4 oz (115.8 kg)   Physical Exam  Constitutional: She is well-developed, well-nourished, and in no distress. No distress.  HENT:  Head: Normocephalic and atraumatic.  Cardiovascular: Normal rate, regular rhythm and normal heart sounds.  Pulmonary/Chest: Effort normal and breath sounds normal. No respiratory distress.  Abdominal: Soft. Bowel sounds are normal. She exhibits no distension.  Skin: Skin is warm and dry. She is not diaphoretic.  2 small 4x4 mm echymoses on the lateral left Big toe without surrounding erythema, ulceration, skin breakdown or pustular drainage  Psychiatric: Affect normal.  Nursing note and vitals reviewed.  Assessment & Plan:   See Encounters Tab for problem based charting.  Patient discussed with Dr. Antony Contras

## 2019-11-06 LAB — TSH: TSH: 10.2 u[IU]/mL — ABNORMAL HIGH (ref 0.450–4.500)

## 2019-11-06 NOTE — Progress Notes (Signed)
Internal Medicine Clinic Attending  Case discussed with Dr. Lanierat the time of the visit.  We reviewed the resident's history and exam and pertinent patient test results.  I agree with the assessment, diagnosis, and plan of care documented in the resident's note.   

## 2019-11-12 ENCOUNTER — Telehealth: Payer: Self-pay | Admitting: *Deleted

## 2019-11-12 NOTE — Telephone Encounter (Signed)
I had called her multiple times and there was no answer. Called just now and spoke with her. She needs an appointment in 6-8 weeks for A1c check and we can recheck that TSH at that time as well. Likely continuing to come down as likely she hasn't been taking her thyroid medicines regularly until recently.

## 2019-11-12 NOTE — Telephone Encounter (Signed)
Pt called / stated she had seen in My Chart that her thyroid level is still "not normal" and no one has called her to let her know if medication needs to be changed. She wants the doctor to call her.

## 2019-11-13 ENCOUNTER — Ambulatory Visit: Admitting: Licensed Clinical Social Worker

## 2019-11-25 ENCOUNTER — Telehealth: Payer: Self-pay

## 2019-11-25 NOTE — Telephone Encounter (Signed)
11/25/19 Unable to leave message voicemail not set-up.  Will attempt to call again 11/26/19.  Emailed resources to [email or web address].  [employer] [phone]

## 2019-11-26 ENCOUNTER — Telehealth

## 2019-11-28 ENCOUNTER — Ambulatory Visit

## 2019-11-28 ENCOUNTER — Ambulatory Visit: Admitting: *Deleted

## 2019-11-28 DIAGNOSIS — Z794 Long term (current) use of insulin: Secondary | ICD-10-CM

## 2019-11-28 DIAGNOSIS — J449 Chronic obstructive pulmonary disease, unspecified: Secondary | ICD-10-CM

## 2019-11-28 DIAGNOSIS — E1142 Type 2 diabetes mellitus with diabetic polyneuropathy: Secondary | ICD-10-CM

## 2019-11-28 DIAGNOSIS — I5032 Chronic diastolic (congestive) heart failure: Secondary | ICD-10-CM

## 2019-11-28 NOTE — Progress Notes (Signed)
Internal Medicine Clinic Resident  I have personally reviewed this encounter including the documentation in this note and/or discussed this patient with the care management provider. I will address any urgent items identified by the care management provider and will communicate my actions to the patient's PCP. I have reviewed the patient's CCM visit with my supervising attending, Dr Narendra.  Emagene Merfeld, MD  Internal Medicine, PGY-2 11/28/2019    

## 2019-11-28 NOTE — Patient Instructions (Addendum)
Visit Information I'm sorry to hear about your home situation. Please review the resources Jeffie Pollock, the community care guide,  emailed you on 6/28. Millie or Amber Chrismon CCM BSW will call you today.  Goals Addressed              This Visit's Progress     Patient Stated     "I'm not working and separated from husband on and off so I'm having trouble paying for everything." (pt-stated)        CARE PLAN ENTRY (see longitudinal plan of care for additional care plan information)  Current Barriers:   Care Coordination needs related to financial strain in a patient with HTN, DM, COPD and bipolar disease.- patient called to say her husband is leaving her and she has no income and no means of paying bills; she says her phone will be cut off on 7/3 because she will not be able to pay the bill  Nurse Case Manager Clinical Goal(s):   Over the next 30 days, patient will work with CCM social worker and care guide to address financial strain.   Interventions:   Inter-disciplinary care team collaboration (see longitudinal plan of care)- reviewed with patient notes from [facility] Guide Olean Ree regarding her attempts to reach patient and information that was emailed to patient on 6/28;  contacted CCM BSW regarding change in patient's living situation  Social Work referral for financial strain   Patient Self Care Activities:   Patient verbalizes understanding of plan to work with BSW and Care Guide to address financial strain issues  Unable to independently determine resources to address financial strain issues  and food insecurity  Please see past updates related to this goal by clicking on the "Past Updates" button in the selected goal         The patient verbalized understanding of instructions provided today and declined a print copy of patient instruction materials.   The care management team will reach out to the patient again over the next 1-2 days.   CCM RN will  make outreach in the next 30-60 days.  Cranford Mon RN, CCM, CDCES CCM Clinic RN Care Manager [phone]

## 2019-11-28 NOTE — Chronic Care Management (AMB) (Signed)
  Care Management   Follow Up Note   11/28/2019 Name: Karen [name] MRN: [id number] DOB: May 29, 1976  Referred by: Merrilyn Puma, MD Reason for referral : Care Coordination [facility])   Karen [name] is a 44 y.o. year old female who is a primary care patient of Merrilyn Puma, MD. The care management team was consulted for assistance with care management and care coordination needs.    Review of patient status, including review of consultants reports, relevant laboratory and other test results, and collaboration with appropriate care team members and the patient's provider was performed as part of comprehensive patient evaluation and provision of chronic care management services.    SDOH (Social Determinants of Health) assessments performed: No See Care Plan activities for detailed interventions related to [facility])     Advanced Directives: See Care Plan and Vynca application for related entries.   Goals Addressed              This Visit's Progress   .  "I'm not working and separated from husband on and off so I'm having trouble paying for everything." (pt-stated)        CARE PLAN ENTRY (see longitudinal plan of care for additional care plan information)  Current Barriers:  . Care Coordination needs related to financial strain in a patient with HTN, DM, COPD and bipolar disease.- patient called to say her husband is leaving her and she has no income and no means of paying bills; she says her phone will be cut off on 7/3 because she will not be able to pay the bill.  Patient reports that she applied with Emergency Rental Assistance Program back in April but application is still being processed.     Nurse Case Manager Clinical Goal(s):  Marland Kitchen Over the next 30 days, patient will work with CCM Child psychotherapist and care guide to address financial strain.   Interventions:  . Provided patient with contact information for the following:  [place] [address], Holiday representative, [employer],  [employer], [employer], Engineer, water. [employer], and Designer, fashion/clothing   Submitted referral to [employer], Retail buyer and Intervention, [employer], and [employer] via [place] platform. . Informed patient that referral to [facility] will only get her added into system.  Provided her with contact information and encouraged her to call regarding application for assistance.    Patient Self Care Activities:  . Patient verbalizes understanding of plan to work with BSW and Care Guide to address financial strain issues . Unable to independently determine resources to address financial strain issues  and food insecurity  Please see past updates related to this goal by clicking on the "Past Updates" button in the selected goal          The patient has been provided with contact information for the CCM BSW and has been advised to call with any resource related questions or concerns.      Malachy Chamber, BSW Embedded Care Coordination Social Worker [facility] [phone]

## 2019-11-28 NOTE — Patient Instructions (Addendum)
Visit Information  Goals Addressed              This Visit's Progress   .  "I'm not working and separated from husband on and off so I'm having trouble paying for everything." (pt-stated)        CARE PLAN ENTRY (see longitudinal plan of care for additional care plan information)  Current Barriers:  . Care Coordination needs related to financial strain in a patient with HTN, DM, COPD and bipolar disease.- patient called to say her husband is leaving her and she has no income and no means of paying bills; she says her phone will be cut off on 7/3 because she will not be able to pay the bill.  Patient reports that she applied with Emergency Rental Assistance Program back in April but application is still being processed.     Nurse Case Manager Clinical Goal(s):  Marland Kitchen Over the next 30 days, patient will work with CCM Child psychotherapist and care guide to address financial strain.   Interventions:  . Provided patient with contact information for the following:  [place] [address], Holiday representative, [employer], [employer], [employer], Engineer, water. [employer], and Designer, fashion/clothing  Submitted referral to [employer], Retail buyer and Intervention, [employer], and [employer] via [employer] [place] platform. . Informed patient that referral to [facility] will only get her added into system.  Provided her with contact information and encouraged her to call regarding application for assistance.    Patient Self Care Activities:  . Patient verbalizes understanding of plan to work with BSW and Care Guide to address financial strain issues . Unable to independently determine resources to address financial strain issues  and food insecurity  Please see past updates related to this goal by clicking on the "Past Updates" button in the selected goal         Patient verbalizes understanding of instructions provided today.     The patient has been provided with contact information for the CCM BSW and has been advised to call with any resource related questions or concerns.       Malachy Chamber, BSW Embedded Care Coordination Social Worker [employer] [facility] [phone]

## 2019-11-28 NOTE — Chronic Care Management (AMB) (Signed)
°  Care Management   Follow Up Note   11/28/2019 Name: Karen [name] MRN: [id number] DOB: Jul 26, 1975  Referred by: Merrilyn Puma, MD Reason for referral : Care Coordination (DM, HF COPD)   Karen [name] is a 44 y.o. year old female who is a primary care patient of Merrilyn Puma, MD. The care management team was consulted for assistance with care management and care coordination needs.    Review of patient status, including review of consultants reports, relevant laboratory and other test results, and collaboration with appropriate care team members and the patient's provider was performed as part of comprehensive patient evaluation and provision of chronic care management services.    SDOH (Social Determinants of Health) assessments performed: No See Care Plan activities for detailed interventions related to [facility])     Advanced Directives: See Care Plan and Vynca application for related entries.   Goals Addressed              This Visit's Progress     Patient Stated     "I'm not working and separated from husband on and off so I'm having trouble paying for everything." (pt-stated)        CARE PLAN ENTRY (see longitudinal plan of care for additional care plan information)  Current Barriers:   Care Coordination needs related to financial strain in a patient with HTN, DM, COPD and bipolar disease.- patient called to say her husband is leaving her and she has no income and no means of paying bills; she says her phone will be cut off on 7/3 because she will not be able to pay the bill  Nurse Case Manager Clinical Goal(s):   Over the next 30 days, patient will work with CCM social worker and care guide to address financial strain.   Interventions:   Inter-disciplinary care team collaboration (see longitudinal plan of care)- reviewed with patient notes from [facility] Guide Olean Ree regarding her attempts to reach patient and information that was emailed to patient on 6/28;   contacted CCM BSW regarding change in patient's living situation  Social Work referral for financial strain   Patient Self Care Activities:   Patient verbalizes understanding of plan to work with BSW and Care Guide to address financial strain issues  Unable to independently determine resources to address financial strain issues  and food insecurity  Please see past updates related to this goal by clicking on the "Past Updates" button in the selected goal          The care management team will reach out to the patient again over the next 1-2 days.   CCM RN will reach out to patient over the next 30-60 days.  Cranford Mon RN, CCM, CDCES CCM Clinic RN Care Manager [phone]

## 2019-11-29 ENCOUNTER — Telehealth

## 2019-11-29 ENCOUNTER — Ambulatory Visit: Payer: Self-pay

## 2019-11-29 NOTE — Chronic Care Management (AMB) (Signed)
°  Chronic Care Management   Outreach Note  11/29/2019 Name: Karen [name] MRN: [id number] DOB: 01-22-1976  Referred by: Merrilyn Puma, MD Reason for referral : Care Coordination [facility])   An unsuccessful telephone outreach was attempted today. The patient was referred to the case management team for assistance with care management and care coordination. Unable to leave voicemail message.    Follow Up Plan: Will attempt to reach again next week.    Malachy Chamber, BSW Embedded Care Coordination Social Worker [facility] [phone]

## 2019-11-29 NOTE — Progress Notes (Signed)
Internal Medicine Clinic Resident  I have personally reviewed this encounter including the documentation in this note and/or discussed this patient with the care management provider. I will address any urgent items identified by the care management provider and will communicate my actions to the patient's PCP. I have reviewed the patient's CCM visit with my supervising attending, Dr Narendra.  Mahima Hottle, MD  IM PGY-2 11/29/2019    

## 2019-12-04 ENCOUNTER — Telehealth: Payer: Self-pay

## 2019-12-04 NOTE — Telephone Encounter (Signed)
12/04/19  Unable to leave message voicemail not set-up.  Will attempt to call again 12/05/19.  Karen [name] [phone]

## 2019-12-05 ENCOUNTER — Telehealth: Payer: Self-pay

## 2019-12-05 NOTE — Telephone Encounter (Signed)
12/05/19 3rd attempt unable to leave message  on patient's  voicemail not set-up. Called husbands cell number voicemail did not pick up.  Unable to contact patient after 3 attempts closing referral.  Karen [name] [phone]

## 2019-12-06 ENCOUNTER — Telehealth

## 2019-12-06 NOTE — Progress Notes (Signed)
  Subjective:  Patient ID: Karen [name], female    DOB: 1976-05-20,  MRN: [id number]  Chief Complaint  Patient presents with  . Nail Problem    Bilateral hallux - nail fungus per PCP. No treatments.   . Wound Check    L hallux, lateral. x2 weeks. Pt stated, "My PCP told me to get it treated. It hasn't been an open sore. No bleeding or drainage. 8/10 pain".   . Diabetes    Most recent HgbA1c per pt = 12.2.  Marland Kitchen Peripheral Neuropathy    44 y.o. female presents with the above complaint. History confirmed with patient.   Objective:  Physical Exam: warm, good capillary refill, nail exam onychomycosis of the toenails, no trophic changes or ulcerative lesions, normal DP and PT pulses and absent protective sensation. Left Foot: normal exam, no swelling, tenderness, instability; ligaments intact, full range of motion of all ankle/foot joints  Right Foot: normal exam, no swelling, tenderness, instability; ligaments intact, full range of motion of all ankle/foot joints   Assessment:   1. 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  Onychomycosis, Diabetes and DPN -Patient is diabetic with a qualifying condition for at risk foot care. -Educated on DM Footcare.  Procedure: Nail Debridement Rationale: Patient meets criteria for routine foot care due to DPN Type of Debridement: manual, sharp debridement. Instrumentation: Nail nipper, rotary burr. Number of Nails: 10    No follow-ups on file.

## 2019-12-09 ENCOUNTER — Telehealth

## 2019-12-09 ENCOUNTER — Telehealth: Payer: Self-pay | Admitting: Internal Medicine

## 2019-12-09 NOTE — Chronic Care Management (AMB) (Signed)
  Care Management   Note  12/09/2019 Name: Karen [name] MRN: [id number] DOB: [birth year]  Karen [name] is a 44 y.o. year old female who is a primary care patient of Verdene Lennert, MD and is actively engaged with the care management team. I reached out to Valera Castle by phone today to assist with re-scheduling an initial visit with the BSW.  Follow up plan: Telephone appointment with care management team member scheduled for: 12/16/2019  [facility] Guide, Embedded Care Coordination [facility]  [place], [place] [address] Direct Dial: [phone] [email or web address]: [email or web address]

## 2019-12-12 ENCOUNTER — Other Ambulatory Visit: Payer: Self-pay

## 2019-12-12 ENCOUNTER — Ambulatory Visit (INDEPENDENT_AMBULATORY_CARE_PROVIDER_SITE_OTHER): Admitting: Podiatry

## 2019-12-12 DIAGNOSIS — M2042 Other hammer toe(s) (acquired), left foot: Secondary | ICD-10-CM

## 2019-12-12 NOTE — Progress Notes (Signed)
  Subjective:  Patient ID: Karen [name], female    DOB: Aug 16, 1975,  MRN: [id number]  Chief Complaint  Patient presents with  . Nail Problem    Pt states "I rub my toenails against my toes and it is causing some pain, I need another spacer I lost mine".  Pt states bilateral 1st toenails sore on lateral aspect due to rubbing toes together.   44 y.o. female presents with the above complaint. History confirmed with patient.   Objective:  Physical Exam: warm, good capillary refill, nail exam onychomycosis of the toenails, no trophic changes or ulcerative lesions, normal DP and PT pulses and absent protective sensation. Left Foot: normal exam, no swelling, tenderness, instability; ligaments intact, full range of motion of all ankle/foot joints. Left hallux lateral hyperkeratosis with dried blood without open ulcer. Right Foot: normal exam, no swelling, tenderness, instability; ligaments intact, full range of motion of all ankle/foot joints   Assessment:   1. Hammertoe of left foot    Plan:  Patient was evaluated and treated and all questions answered.  Interdigital Corn Left Hallux -Gently debrided no open ulcer -Sling-type toe spacers dispensed x2  Onychomycosis -Declines nail debridement.  Return if symptoms worsen or fail to improve.

## 2019-12-16 ENCOUNTER — Telehealth

## 2019-12-17 ENCOUNTER — Ambulatory Visit: Payer: Self-pay

## 2019-12-17 ENCOUNTER — Telehealth

## 2019-12-17 NOTE — Chronic Care Management (AMB) (Signed)
  Chronic Care Management   Outreach Note  12/17/2019 Name: Karen [name] MRN: [id number] DOB: 27-Feb-1976  Referred by: Verdene Lennert, MD Reason for referral : Care Coordination [facility])   A second unsuccessful telephone outreach was attempted today. The patient was referred to the case management team for assistance with care management and care coordination. Unable to leave message due to mailbox being full.   Follow Up Plan: Will plan to meet with patient after appointment with new primary care provider scheduled for 01/02/20.     Malachy Chamber, BSW Embedded Care Coordination Social Worker [facility] [phone]

## 2020-01-02 ENCOUNTER — Telehealth: Payer: Self-pay

## 2020-01-02 ENCOUNTER — Telehealth

## 2020-01-02 ENCOUNTER — Telehealth: Payer: Self-pay | Admitting: *Deleted

## 2020-01-02 ENCOUNTER — Encounter: Admitting: Internal Medicine

## 2020-01-02 NOTE — Telephone Encounter (Signed)
°  Chronic Care Management   Outreach Note  01/02/2020 Name: Karen [name] MRN: [id number] DOB: 1975/08/08  Referred by: Verdene Lennert, MD Reason for referral : Care Coordination [facility])   Third unsuccessful telephone outreach was attempted by CCM BSW today. The patient was referred to the case management team for assistance with care management and care coordination. Unable to leave message due to mailbox not being set up.   Follow Up Plan: RNCM, Cranford Mon, will attempt to reach patient again.  If unsuccessful, goals of care plan will be closed.      Malachy Chamber, BSW Embedded Care Coordination Social Worker [facility] [phone]

## 2020-01-03 ENCOUNTER — Ambulatory Visit: Payer: Self-pay | Admitting: *Deleted

## 2020-01-03 ENCOUNTER — Telehealth

## 2020-01-03 ENCOUNTER — Telehealth: Payer: Self-pay | Admitting: *Deleted

## 2020-01-03 DIAGNOSIS — I5032 Chronic diastolic (congestive) heart failure: Secondary | ICD-10-CM

## 2020-01-03 DIAGNOSIS — E1159 Type 2 diabetes mellitus with other circulatory complications: Secondary | ICD-10-CM

## 2020-01-03 DIAGNOSIS — E1142 Type 2 diabetes mellitus with diabetic polyneuropathy: Secondary | ICD-10-CM

## 2020-01-03 DIAGNOSIS — J449 Chronic obstructive pulmonary disease, unspecified: Secondary | ICD-10-CM

## 2020-01-03 NOTE — Telephone Encounter (Signed)
  Care Management   Outreach Note  01/03/2020 Name: Karen [name] MRN: [id number] DOB: 1976-03-26  Referred by: Verdene Lennert, MD Reason for referral : Care Coordination (DM, HF, COPD)   Another unsuccessful telephone outreach was attempted today. The patient was referred to the case management team for assistance with care management and care coordination. The patient's primary care provider has been notified of our unsuccessful attempts to make or maintain contact with the patient. The care management team is pleased to engage with this patient at any time in the future should he/she be interested in assistance from the care management team.   Follow Up Plan: No further follow up required: as have been unable to maintain contact with patient.  Cranford Mon RN, CCM, CDCES CCM Clinic RN Care Manager [phone]

## 2020-01-03 NOTE — Chronic Care Management (AMB) (Signed)
  Care Management   Outreach Note  01/03/2020 Name: Karen [name] MRN: [id number] DOB: 08-06-1975  Referred by: Verdene Lennert, MD Reason for referral : Care Coordination (IDDM, HTN, COPD)   Another unsuccessful telephone outreach was attempted today. The patient was referred to the case management team for assistance with care management and care coordination. The patient's primary care provider has been notified of our unsuccessful attempts to make or maintain contact with the patient. The care management team is pleased to engage with this patient at any time in the future should he/she be interested in assistance from the care management team.   Follow Up Plan: No further follow up required: due to unable to maintain contact with patient  Cranford Mon RN, CCM, CDCES CCM Clinic RN Care Manager [phone]

## 2020-01-07 NOTE — Progress Notes (Signed)
Internal Medicine Clinic Resident  I have personally reviewed this encounter including the documentation in this note and/or discussed this patient with the care management provider. CCM closing case as unable to maintain contact with patient. I will address any urgent items identified by the care management provider and will communicate my actions to the patient's PCP. I have reviewed the patient's CCM visit with my supervising attending, Dr Sandre Kitty.  Merrilyn Puma, MD 01/07/2020

## 2020-01-07 NOTE — Progress Notes (Signed)
Internal Medicine Clinic Attending  CCM services provided by the care management provider and their documentation were discussed with Dr. Austin Miles. We reviewed the pertinent findings, urgent action items addressed by the resident and non-urgent items to be addressed by the PCP.  I agree with the assessment, diagnosis, and plan of care documented in the CCM and resident's note.  Anne Shutter, MD 01/07/2020

## 2020-02-05 ENCOUNTER — Other Ambulatory Visit: Payer: Self-pay

## 2020-02-05 ENCOUNTER — Ambulatory Visit (HOSPITAL_COMMUNITY)
Admission: EM | Admit: 2020-02-05 | Discharge: 2020-02-05 | Disposition: A | Discharge status: Home / Self Care | Attending: Urgent Care | Admitting: Urgent Care

## 2020-02-05 DIAGNOSIS — Z794 Long term (current) use of insulin: Secondary | ICD-10-CM | POA: Diagnosis not present

## 2020-02-05 DIAGNOSIS — Z87891 Personal history of nicotine dependence: Secondary | ICD-10-CM | POA: Insufficient documentation

## 2020-02-05 DIAGNOSIS — I10 Essential (primary) hypertension: Secondary | ICD-10-CM | POA: Insufficient documentation

## 2020-02-05 DIAGNOSIS — M797 Fibromyalgia: Secondary | ICD-10-CM | POA: Diagnosis not present

## 2020-02-05 DIAGNOSIS — R109 Unspecified abdominal pain: Secondary | ICD-10-CM | POA: Insufficient documentation

## 2020-02-05 DIAGNOSIS — R0981 Nasal congestion: Secondary | ICD-10-CM | POA: Diagnosis present

## 2020-02-05 DIAGNOSIS — Z886 Allergy status to analgesic agent status: Secondary | ICD-10-CM | POA: Insufficient documentation

## 2020-02-05 DIAGNOSIS — J069 Acute upper respiratory infection, unspecified: Secondary | ICD-10-CM | POA: Insufficient documentation

## 2020-02-05 DIAGNOSIS — Z79899 Other long term (current) drug therapy: Secondary | ICD-10-CM | POA: Diagnosis not present

## 2020-02-05 DIAGNOSIS — G4733 Obstructive sleep apnea (adult) (pediatric): Secondary | ICD-10-CM | POA: Diagnosis not present

## 2020-02-05 DIAGNOSIS — R519 Headache, unspecified: Secondary | ICD-10-CM | POA: Insufficient documentation

## 2020-02-05 DIAGNOSIS — R52 Pain, unspecified: Secondary | ICD-10-CM

## 2020-02-05 DIAGNOSIS — E119 Type 2 diabetes mellitus without complications: Secondary | ICD-10-CM | POA: Insufficient documentation

## 2020-02-05 DIAGNOSIS — Z791 Long term (current) use of non-steroidal anti-inflammatories (NSAID): Secondary | ICD-10-CM | POA: Diagnosis not present

## 2020-02-05 DIAGNOSIS — Z9049 Acquired absence of other specified parts of digestive tract: Secondary | ICD-10-CM | POA: Insufficient documentation

## 2020-02-05 DIAGNOSIS — Z20822 Contact with and (suspected) exposure to covid-19: Secondary | ICD-10-CM | POA: Insufficient documentation

## 2020-02-05 DIAGNOSIS — J449 Chronic obstructive pulmonary disease, unspecified: Secondary | ICD-10-CM | POA: Diagnosis not present

## 2020-02-05 LAB — SARS CORONAVIRUS 2 (TAT 6-24 HRS): SARS Coronavirus 2: NEGATIVE

## 2020-02-05 MED ORDER — PROMETHAZINE-DM 6.25-15 MG/5ML PO SYRP
5.0000 mL | ORAL_SOLUTION | Freq: Every evening | ORAL | 0 refills | Status: DC | PRN
Start: 2020-02-05 — End: 2020-03-30

## 2020-02-05 MED ORDER — CETIRIZINE HCL 10 MG PO TABS: 10.0000 mg | ORAL_TABLET | Freq: Every day | ORAL | 0 refills | Status: AC

## 2020-02-05 MED ORDER — BENZONATATE 100 MG PO CAPS: 100.0000 mg | ORAL_CAPSULE | Freq: Three times a day (TID) | ORAL | 0 refills | Status: DC | PRN

## 2020-02-05 MED ORDER — PSEUDOEPHEDRINE HCL 60 MG PO TABS
60.0000 mg | ORAL_TABLET | Freq: Three times a day (TID) | ORAL | 0 refills | Status: DC | PRN
Start: 2020-02-05 — End: 2020-03-30

## 2020-02-05 NOTE — Discharge Instructions (Signed)

## 2020-02-05 NOTE — ED Provider Notes (Signed)
[facility]   MRN: [id number] DOB: [birth year]  Subjective:   Karen [name] is a 44 y.o. female presenting for 2-day history of acute onset congestion, runny nose, generalized headache and body aches, general belly pain, diarrhea and loose stools, nausea, subjective fever.  Patient had exposure to a family member that tested positive for COVID-19 this past weekend.  She has gotten her Covid vaccination.  Has a history of diabetes, hypertension, COPD.  Has not tried any over-the-counter medications.  Patient works at [employer], has had plenty of exposure.  No current facility-administered medications for this encounter.  Current Outpatient Medications:  .  albuterol (VENTOLIN HFA) 108 (90 Base) MCG/ACT inhaler, Inhale 2 puffs into the lungs every 4 (four) hours as needed for wheezing or shortness of breath., Disp: 4 g, Rfl: 2 .  amLODipine (NORVASC) 10 MG tablet, Take 1 tablet (10 mg total) by mouth daily., Disp: 90 tablet, Rfl: 3 .  ARIPiprazole (ABILIFY) 10 MG tablet, Take 1 tablet (10 mg total) by mouth at bedtime., Disp: 30 tablet, Rfl: 3 .  Blood Glucose Monitoring Suppl (FREESTYLE LITE) DEVI, Use to check blood sugar up to 3 times a day, Disp: 1 each, Rfl: 1 .  divalproex (DEPAKOTE) 250 MG DR tablet, Take 1 tablet (250 mg total) by mouth 2 (two) times daily., Disp: 60 tablet, Rfl: 3 .  empagliflozin (JARDIANCE) 10 MG TABS tablet, Take 1 tablet (10 mg total) by mouth daily., Disp: 90 tablet, Rfl: 3 .  exenatide (BYETTA 10 MCG PEN) 10 MCG/0.04ML SOPN injection, Inject 0.04 mLs (10 mcg total) into the skin 2 (two) times daily with a meal., Disp: 45 pen, Rfl: 3 .  FLUoxetine (PROZAC) 20 MG tablet, Take 1 tablet (20 mg total) by mouth daily., Disp: 90 tablet, Rfl: 3 .  fluticasone (FLONASE) 50 MCG/ACT nasal spray, Place 1-2 sprays into both nostrils daily for 7 days., Disp: 1 g, Rfl: 0 .  gabapentin (NEURONTIN) 300 MG capsule, Take 3 capsules (900 mg total) by mouth 2 (two) times  daily., Disp: 540 capsule, Rfl: 3 .  glucose blood (FREESTYLE LITE) test strip, Use to check blood sugar 3 times daily. diag code E11.9. insulin dependent, Disp: 300 each, Rfl: 3 .  insulin aspart (NOVOLOG FLEXPEN) 100 UNIT/ML FlexPen, Inject 10 Units into the skin 3 (three) times daily with meals., Disp: 45 mL, Rfl: 3 .  insulin detemir (LEVEMIR FLEXPEN) 100 UNIT/ML FlexPen, Inject 40 Units into the skin daily at 10 pm. IM program, Disp: 135 mL, Rfl: 3 .  Insulin Pen Needle (NOVOFINE PLUS) 32G X 4 MM MISC, 1 Dose by Does not apply route 4 (four) times daily. Use with Levemir and Novolog, IM program, Disp: 390 each, Rfl: 3 .  Lancets (FREESTYLE) lancets, Use to check blood sugar 3 times daily. diag code E11.9. insulin dependent, Disp: 300 each, Rfl: 3 .  levothyroxine (SYNTHROID) 150 MCG tablet, Take 1 tablet (150 mcg total) by mouth daily before breakfast., Disp: 90 tablet, Rfl: 3 .  lisinopril (ZESTRIL) 40 MG tablet, Take 1 tablet (40 mg total) by mouth daily., Disp: 90 tablet, Rfl: 3 .  lovastatin (MEVACOR) 40 MG tablet, Take 1 tablet (40 mg total) by mouth at bedtime., Disp: 90 tablet, Rfl: 3 .  meclizine (ANTIVERT) 25 MG tablet, Take 1 tablet (25 mg total) by mouth 3 (three) times daily as needed for dizziness., Disp: 30 tablet, Rfl: 0 .  metFORMIN (GLUCOPHAGE) 500 MG tablet, Take 2 tablets (1,000  mg total) by mouth 2 (two) times daily with a meal., Disp: 360 tablet, Rfl: 3 .  methylphenidate (RITALIN) 20 MG tablet, Take 1 tablet (20 mg total) by mouth 2 (two) times daily with breakfast and lunch. Take in the morning and at lunch, no later than 3 PM., Disp: 60 tablet, Rfl: 0 .  montelukast (SINGULAIR) 10 MG tablet, Take 1 tablet (10 mg total) by mouth at bedtime., Disp: 90 tablet, Rfl: 3 .  naproxen (NAPROSYN) 500 MG tablet, Take 1 tablet (500 mg total) by mouth 2 (two) times daily with a meal., Disp: 42 tablet, Rfl: 0 .  omeprazole (PRILOSEC) 40 MG capsule, Take 1 capsule (40 mg total) by mouth 2  (two) times daily at 8 am and 10 pm., Disp: 180 capsule, Rfl: 3 .  ondansetron (ZOFRAN-ODT) 4 MG disintegrating tablet, Take 1 tablet (4 mg total) by mouth every 8 (eight) hours as needed for nausea or vomiting., Disp: 15 tablet, Rfl: 0 .  prazosin (MINIPRESS) 1 MG capsule, Take 1 capsule (1 mg total) by mouth at bedtime., Disp: 30 capsule, Rfl: 3 .  sucralfate (CARAFATE) 1 g tablet, Take 1 tablet (1 g total) by mouth 4 (four) times daily -  with meals and at bedtime for 7 days., Disp: 28 tablet, Rfl: 0 .  triamcinolone cream (KENALOG) 0.1 %, Apply 1 application topically 2 (two) times daily., Disp: 30 g, Rfl: 0 .  vitamin E 180 MG (400 UNITS) capsule, Take 1 capsule (400 Units total) by mouth daily., Disp: 30 capsule, Rfl: 0   Allergies  Allergen Reactions  . Aspirin Other (See Comments)    Was told by her mother not to take it in the past but was put on it a few years ago and did fine without any adverse effect  . Tylenol [Acetaminophen]     Does not take due to fatty liver disease    Past Medical History:  Diagnosis Date  . Atypical chest pain    a. Cath 11/2017 - R/LHC showing 30% mid LAD, otherwise no significant disease, EF 55-65%, normal LVEDP, normal heart pressures (no evidence of significant CAD or CHF).  . COPD (chronic obstructive pulmonary disease) (HCC)   . Diabetes mellitus (HCC) 04/17/2017  . Fibromyalgia syndrome 04/17/2017  . GERD (gastroesophageal reflux disease) 04/17/2017  . Hypertension   . Hypothyroidism 04/17/2017  . 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 Obstructive sleep apnea 09/26/2017  . Recurrent syncope   . Tobacco use 11/15/2017     Past Surgical History:  Procedure Laterality Date  . btl    . CHOLECYSTECTOMY    . RIGHT/LEFT HEART CATH AND CORONARY ANGIOGRAPHY N/A 12/07/2017   Procedure: RIGHT/LEFT HEART CATH AND CORONARY ANGIOGRAPHY;  Surgeon: Marykay Lex, MD;  Location: [facility] INVASIVE CV LAB;  Service: Cardiovascular;  Laterality: N/A;  . shoulder  Bilateral     Family History  Problem Relation Age of Onset  . Hypertension Mother   . Syncope episode Mother   . Heart disease Maternal Grandmother        unclear details    Social History   Tobacco Use  . Smoking status: Former Smoker    Packs/day: 0.30    Types: Cigarettes    Quit date: 10/29/2017    Years since quitting: 2.2  . Smokeless tobacco: Never Used  Vaping Use  . Vaping Use: Never used  Substance Use Topics  . Alcohol use: Yes    Comment: rarely  . Drug use: No  ROS   Objective:   Vitals: BP 112/72 (BP Location: Right Arm)   Pulse 92   Temp 98.2 F (36.8 C) (Oral)   Resp 20   LMP 01/12/2020   SpO2 97%   Physical Exam Constitutional:      General: She is not in acute distress.    Appearance: Normal appearance. She is well-developed. She is obese. She is not ill-appearing, toxic-appearing or diaphoretic.  HENT:     Head: Normocephalic and atraumatic.     Nose: Nose normal.     Mouth/Throat:     Mouth: Mucous membranes are moist.  Eyes:     Extraocular Movements: Extraocular movements intact.     Pupils: Pupils are equal, round, and reactive to light.  Cardiovascular:     Rate and Rhythm: Normal rate and regular rhythm.     Pulses: Normal pulses.     Heart sounds: Normal heart sounds. No murmur heard.  No friction rub. No gallop.   Pulmonary:     Effort: Pulmonary effort is normal. No respiratory distress.     Breath sounds: Normal breath sounds. No stridor. No wheezing, rhonchi or rales.  Skin:    General: Skin is warm and dry.     Findings: No rash.  Neurological:     Mental Status: She is alert and oriented to person, place, and time.  Psychiatric:        Mood and Affect: Mood normal.        Behavior: Behavior normal.        Thought Content: Thought content normal.        Judgment: Judgment normal.      Assessment and Plan :   PDMP not reviewed this encounter.  1. Viral URI with cough   2. Nasal congestion   3. Generalized  headache   4. Body aches   5. Belly pain     Patient is high risk given her chronic conditions and would benefit from infusion clinic treatment have her COVID-19 test is positive.  Otherwise, will manage for viral illness such as viral URI, viral syndrome, viral rhinitis, COVID-19. Counseled patient on nature of COVID-19 including modes of transmission, diagnostic testing, management and supportive care.  Offered scripts for symptomatic relief. COVID 19 testing is pending. Counseled patient on potential for adverse effects with medications prescribed/recommended today, ER and return-to-clinic precautions discussed, patient verbalized understanding.     Wallis Bamberg, PA-C 02/05/20 1022

## 2020-02-05 NOTE — ED Triage Notes (Addendum)
Pt c/o congestion, runny nose, HA, general body aches, general abdominal pain, diarrhea/nausea, low-grade fever of 99.8, onset two days ago.Pt reports she was exposed to COVID + family members on Saturday. Denies cough, SOB, sore throat, ear pain loss of taste/smell. Denies nausea at present. Last diarrhea at 0300.  No OTC meds taken.

## 2020-02-11 ENCOUNTER — Other Ambulatory Visit: Payer: Self-pay | Admitting: *Deleted

## 2020-02-11 DIAGNOSIS — K029 Dental caries, unspecified: Secondary | ICD-10-CM

## 2020-02-11 MED ORDER — NAPROXEN 500 MG PO TABS: 500.0000 mg | ORAL_TABLET | Freq: Two times a day (BID) | ORAL | 0 refills | Status: AC

## 2020-02-11 MED ORDER — SUCRALFATE 1 G PO TABS: 1.0000 g | ORAL_TABLET | Freq: Three times a day (TID) | ORAL | 0 refills | Status: AC

## 2020-02-27 ENCOUNTER — Other Ambulatory Visit: Payer: Self-pay | Admitting: Internal Medicine

## 2020-02-27 ENCOUNTER — Other Ambulatory Visit: Payer: Self-pay

## 2020-02-27 MED ORDER — METHYLPHENIDATE HCL 20 MG PO TABS: 20.0000 mg | ORAL_TABLET | Freq: Two times a day (BID) | ORAL | 0 refills | Status: AC

## 2020-02-27 MED ORDER — VITAMIN E 180 MG (400 UNIT) PO CAPS: 400.0000 [IU] | ORAL_CAPSULE | Freq: Every day | ORAL | 0 refills | Status: DC

## 2020-02-27 MED ORDER — METHYLPHENIDATE HCL 20 MG PO TABS: 20.0000 mg | ORAL_TABLET | Freq: Two times a day (BID) | ORAL | 0 refills | Status: DC

## 2020-03-11 ENCOUNTER — Ambulatory Visit: Payer: Self-pay | Admitting: *Deleted

## 2020-03-11 DIAGNOSIS — I152 Hypertension secondary to endocrine disorders: Secondary | ICD-10-CM

## 2020-03-11 DIAGNOSIS — E1142 Type 2 diabetes mellitus with diabetic polyneuropathy: Secondary | ICD-10-CM

## 2020-03-11 DIAGNOSIS — E1159 Type 2 diabetes mellitus with other circulatory complications: Secondary | ICD-10-CM

## 2020-03-11 DIAGNOSIS — Z794 Long term (current) use of insulin: Secondary | ICD-10-CM

## 2020-03-11 DIAGNOSIS — J449 Chronic obstructive pulmonary disease, unspecified: Secondary | ICD-10-CM

## 2020-03-11 DIAGNOSIS — I5032 Chronic diastolic (congestive) heart failure: Secondary | ICD-10-CM

## 2020-03-11 NOTE — Chronic Care Management (AMB) (Signed)
°  Care Management   Note  03/11/2020 Name: Maram Bently MRN: [id number] DOB: 1976-05-01  CCM status changed to previously enrolled as unable to maintain contact with patient.  Cranford Mon RN, CCM, CDCES CCM Clinic RN Care Manager [phone]

## 2020-03-12 ENCOUNTER — Other Ambulatory Visit: Payer: Self-pay | Admitting: Internal Medicine

## 2020-03-13 NOTE — Telephone Encounter (Signed)
Patient made aware that Rx for Ritalin is in [facility]. She will pick it up on 03/16/2020. Kinnie Feil, BSN, RN-BC

## 2020-03-30 ENCOUNTER — Encounter: Payer: Self-pay | Admitting: Internal Medicine

## 2020-03-30 ENCOUNTER — Other Ambulatory Visit: Payer: Self-pay

## 2020-03-30 ENCOUNTER — Ambulatory Visit (INDEPENDENT_AMBULATORY_CARE_PROVIDER_SITE_OTHER): Admitting: Internal Medicine

## 2020-03-30 VITALS — BP 127/83 | HR 106 | Temp 99.2°F | Ht 67.0 in | Wt 247.9 lb

## 2020-03-30 DIAGNOSIS — E1142 Type 2 diabetes mellitus with diabetic polyneuropathy: Secondary | ICD-10-CM

## 2020-03-30 DIAGNOSIS — E039 Hypothyroidism, unspecified: Secondary | ICD-10-CM | POA: Diagnosis not present

## 2020-03-30 DIAGNOSIS — K3184 Gastroparesis: Secondary | ICD-10-CM | POA: Insufficient documentation

## 2020-03-30 DIAGNOSIS — Z794 Long term (current) use of insulin: Secondary | ICD-10-CM

## 2020-03-30 DIAGNOSIS — K029 Dental caries, unspecified: Secondary | ICD-10-CM

## 2020-03-30 DIAGNOSIS — I152 Hypertension secondary to endocrine disorders: Secondary | ICD-10-CM

## 2020-03-30 DIAGNOSIS — D509 Iron deficiency anemia, unspecified: Secondary | ICD-10-CM

## 2020-03-30 DIAGNOSIS — F3132 Bipolar disorder, current episode depressed, moderate: Secondary | ICD-10-CM

## 2020-03-30 DIAGNOSIS — K140 Glossitis: Secondary | ICD-10-CM | POA: Diagnosis not present

## 2020-03-30 DIAGNOSIS — E1159 Type 2 diabetes mellitus with other circulatory complications: Secondary | ICD-10-CM

## 2020-03-30 LAB — GLUCOSE, CAPILLARY: Glucose-Capillary: 161 mg/dL — ABNORMAL HIGH (ref 70–99)

## 2020-03-30 LAB — POCT GLYCOSYLATED HEMOGLOBIN (HGB A1C): Hemoglobin A1C: 11.8 % — AB (ref 4.0–5.6)

## 2020-03-30 MED ORDER — NYSTATIN 100000 UNIT/ML MT SUSP: 5.0000 mL | Freq: Four times a day (QID) | OROMUCOSAL | 1 refills | Status: DC

## 2020-03-30 MED ORDER — LIDOCAINE VISCOUS HCL 2 % MT SOLN: 15.0000 mL | Freq: Four times a day (QID) | OROMUCOSAL | 0 refills | Status: DC | PRN

## 2020-03-30 MED ORDER — POLYSACCHARIDE IRON COMPLEX 150 MG PO CAPS: 150.0000 mg | ORAL_CAPSULE | Freq: Every day | ORAL | 3 refills | Status: AC

## 2020-03-30 NOTE — Progress Notes (Signed)
   CC: Dental pain; diabetes; hypothyroidism; to establish with new PCP  HPI:  [name] is a 44 y.o. with a PMHx as listed below who presents to the clinic for Dental pain; diabetes; hypothyroidism; to establish with new PCP.   Please see the Encounters tab for problem-based Assessment & Plan regarding status of patient's acute and chronic conditions.  Past Medical History:  Diagnosis Date  . Atypical chest pain    a. Cath 11/2017 - R/LHC showing 30% mid LAD, otherwise no significant disease, EF 55-65%, normal LVEDP, normal heart pressures (no evidence of significant CAD or CHF).  . COPD (chronic obstructive pulmonary disease) (HCC)   . Diabetes mellitus (HCC) 04/17/2017  . Fibromyalgia syndrome 04/17/2017  . GERD (gastroesophageal reflux disease) 04/17/2017  . Hypertension   . Hypothyroidism 04/17/2017  . 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 Obstructive sleep apnea 09/26/2017  . Recurrent syncope   . Tobacco use 11/15/2017   Review of Systems: Review of Systems  Constitutional: Positive for malaise/fatigue. Negative for chills, fever and weight loss.  Cardiovascular: Negative for chest pain and leg swelling.  Gastrointestinal: Negative for abdominal pain, diarrhea, nausea and vomiting.  Neurological: Positive for dizziness and weakness. Negative for focal weakness.  Psychiatric/Behavioral: Positive for depression. Negative for substance abuse and suicidal ideas. The patient is nervous/anxious.    Physical Exam:  Vitals:   03/30/20 1521 03/30/20 1528  BP: (!) 148/83 127/83  Pulse: (!) 105 (!) 106  Temp: 99.2 F (37.3 C)   TempSrc: Oral   SpO2: 99%   Weight: 247 lb 14.4 oz (112.4 kg)   Height: 5\' 7"  (1.702 m)    Physical Exam Vitals and nursing note reviewed.  Constitutional:      General: She is not in acute distress.    Appearance: She is obese.  HENT:     Head: Normocephalic and atraumatic.     Mouth/Throat:     Mouth: Mucous membranes are moist.      Dentition: Dental caries present.     Pharynx: Oropharynx is clear.      Comments: Throughout the entire tongue, patient is significantly tender to palpation with mild edema noted as well.  Fissuring noted.  No lesions noted. Cardiovascular:     Rate and Rhythm: Normal rate and regular rhythm.     Heart sounds: No murmur heard.   Pulmonary:     Effort: Pulmonary effort is normal. No respiratory distress.     Breath sounds: No wheezing or rales.  Abdominal:     General: Bowel sounds are normal. There is no distension.     Palpations: Abdomen is soft.     Tenderness: There is no abdominal tenderness. There is no guarding.  Skin:    General: Skin is warm and dry.  Neurological:     General: No focal deficit present.     Mental Status: She is alert and oriented to person, place, and time. Mental status is at baseline.  Psychiatric:        Mood and Affect: Affect is flat.        Behavior: Behavior is withdrawn. Behavior is cooperative.    Assessment & Plan:   See Encounters Tab for problem based charting.  Patient discussed with Dr. 

## 2020-03-30 NOTE — Addendum Note (Signed)
Addended by: Bufford Spikes on: 03/30/2020 01:29 PM   Modules accepted: Orders

## 2020-03-30 NOTE — Patient Instructions (Addendum)
It was nice seeing you today! Thank you for choosing [facility] for your Primary Care.    Today we talked about:   1. Tooth ache: For the pain, try taking 2 tablets of Tylenol with 2 tablets of Ibuprofen. You can take this every 6-8 hours. Please reach out to your dentist to make an appointment as soon as possible. If you notice any pus or fevers, give the clinic a call  2. Tongue Pain: I sent in a prescription for Nystatin and Lidocaine.    3. Diabetes: Try switching out Coke for [employer] and let me know what you think. We will talk more about your diabetes at your next visit.   4. Thyroid: I will call you with your results.   5. Iron deficiency: I sent in a prescription for iron supplements to [facility].  6. Please call [facility] to set up an appointment to continue working on your depression.    Please follow-up in 2 weeks

## 2020-03-31 LAB — CBC
Hematocrit: 37.2 % (ref 34.0–46.6)
Hemoglobin: 11.9 g/dL (ref 11.1–15.9)
MCH: 24.4 pg — ABNORMAL LOW (ref 26.6–33.0)
MCHC: 32 g/dL (ref 31.5–35.7)
MCV: 76 fL — ABNORMAL LOW (ref 79–97)
Platelets: 276 10*3/uL (ref 150–450)
RBC: 4.87 x10E6/uL (ref 3.77–5.28)
RDW: 14.4 % (ref 11.7–15.4)
WBC: 11.3 10*3/uL — ABNORMAL HIGH (ref 3.4–10.8)

## 2020-03-31 LAB — BMP8+ANION GAP
Anion Gap: 15 mmol/L (ref 10.0–18.0)
BUN/Creatinine Ratio: 12 (ref 9–23)
BUN: 9 mg/dL (ref 6–24)
CO2: 22 mmol/L (ref 20–29)
Calcium: 9.1 mg/dL (ref 8.7–10.2)
Chloride: 99 mmol/L (ref 96–106)
Creatinine, Ser: 0.78 mg/dL (ref 0.57–1.00)
GFR calc Af Amer: 107 mL/min/{1.73_m2} (ref >59)
GFR calc non Af Amer: 93 mL/min/{1.73_m2} (ref >59)
Glucose: 165 mg/dL — ABNORMAL HIGH (ref 65–99)
Potassium: 4 mmol/L (ref 3.5–5.2)
Sodium: 136 mmol/L (ref 134–144)

## 2020-03-31 LAB — TSH: TSH: 13.7 u[IU]/mL — ABNORMAL HIGH (ref 0.450–4.500)

## 2020-03-31 LAB — IRON AND TIBC
Iron Saturation: 6 % — CL (ref 15–55)
Iron: 24 ug/dL — ABNORMAL LOW (ref 27–159)
Total Iron Binding Capacity: 421 ug/dL (ref 250–450)
UIBC: 397 ug/dL (ref 131–425)

## 2020-03-31 NOTE — Assessment & Plan Note (Signed)
BP Readings from Last 3 Encounters:  03/30/20 127/83  02/05/20 112/72  11/05/19 (!) 143/83   Current medication regimen includes amlodipine, lisinopril and prazosin.  Denies any chest pain.  Assessment/plan: Blood pressure well controlled at this time.  No medication changes made.  -Continue amlodipine 10 mg, lisinopril 40 mg -Continue prazosin 1 mg at bedtime, although this is not for blood pressure control

## 2020-03-31 NOTE — Assessment & Plan Note (Signed)
Karen [name] states she is been having increased pain in her tongue, in addition to swelling.  She notes this periodically happens in the past and usually when she has thrush.  She noted a couple days ago with some white buildup on her tongue that was able to be removed.   Assessment/plan: On examination, there is no current evidence of thrush, however patient's tongue is swollen and quite tender to palpation.  Differential includes glossitis secondary to severe iron deficiency anemia versus thrush with atypical presentation in light of her uncontrolled diabetes.  We will currently treat for both.  -Lidocaine swishes -Nystatin swishes -Start iron supplementation

## 2020-03-31 NOTE — Assessment & Plan Note (Signed)
Ms. Hyde endorses a several month history of dental pain, particularly her left lower molar due to a dental carry that she cannot afford to see dentist for.  She is concerned that it is infected at this time.  She denies any fever, chills, purulent drainage from it.  She has been trying take Aleve 2-3 times daily without significant alleviation of her pain.  Assessment/plan: On examination, does not appear infected at this time.  Recommended she make an appointment with a dentist as soon as she can afford.  -Trial of ibuprofen, 2 tablets and Tylenol, 2 tablets every 6-8 hours as needed for pain

## 2020-03-31 NOTE — Assessment & Plan Note (Addendum)
Ms. Cathy states that she has been taking her Synthroid daily as prescribed, which is 30 minutes before breakfast.  She endorses feeling tired though.  Assessment/plan: Patient currently on Synthroid 150 mg daily with TSH of 13, which makes me believe she may be missing more doses than suspected rather than an ineffective dose of Synthroid.  I am hesitant to increase the dose to 200, as that will be the maximum dose at that time.  I attempted to call patient to discuss results however no answer and voicemail was not set up.  We have a follow-up in 2 weeks, and plan to discuss further with her at that time.

## 2020-03-31 NOTE — Assessment & Plan Note (Addendum)
Lab Results  Component Value Date   HGBA1C 11.8 (A) 03/30/2020   Current medications: NovoLog 10 units (prescribed as 3 times daily, patient uses twice daily), Levemir 40 units at bedtime, Metformin, Jardiance, Exenatide twice daily  Ms. Cowles states that she is taking all her medications as indicated above and generally does not miss doses very often, but she does only use her NovoLog twice a day as she only eats 2 meals a day.  She notes that she knows her A1c is very uncontrolled due to her diet, as she " eats whatever she wants."  This includes multiple sodas per day and food that is quick to eat.  She knows this is unhealthy, but is not motivated to change his habit at this time.  She feels like she is too tired and stressed about her home situation at this time to focus on healthy eating.  She notes every once a while some dizziness that she thinks may be from hypoglycemia, but she never checks her sugars.  She is hesitant to even begin checking her sugars.  Assessment/plan: Previously very well controlled, but significant decline in the last year.  I wonder if patient's severe depression is making difficult for her to focus on her diabetes.  Her regimen is quite hefty and I suspect there is an aspect of noncompliance versus extremely unhealthy diet.  Some medication changes that can be made to simplify patient's regimen include switching Metformin and Jardiance to [place].  Additionally changing Exanatide to Ozempic, will decrease her daily injection burden.    Overall we will need to get a better control over her depression to successfully manage her diabetes.  -Continue with current regimen at this time -2-week follow-up to focus on diabetes specifically

## 2020-04-01 ENCOUNTER — Encounter: Payer: Self-pay | Admitting: Internal Medicine

## 2020-04-01 NOTE — Assessment & Plan Note (Signed)
Karen [name] follows up at [facility], who prescribes her Abilify, Depakote, prazosin, and Prozac.  She feels her depression is not well controlled at this time and is specifically triggered by her home situation with her husband.  Her support system lives in [place] and so she does not get to see them daily.  She tries to talk to them as often as she can, however she cannot do it with her husband in the house and he generally is in the home.  She notes excessive sleepiness and poor diet.  She endorses fatigue, lack of interest in previously enjoyed activities.  She denies any SI or HI.  Assessment/plan: Per chart review, very elevated PHQ-9 stating back to at least 2018.  I strongly encourage patient to contact [facility] and make an appointment to discuss that her current treatment plan is not working for her.  Karen [name] states she will do that.

## 2020-04-01 NOTE — Telephone Encounter (Signed)
Pls contact pt [phone]

## 2020-04-01 NOTE — Assessment & Plan Note (Signed)
Karen [name] states that she has been eating a lot of ice lately and has been doing so for a long time she states she knows that she is iron deficient and that it stems mostly from her heavy menstrual cycles.  She states that during her cycle, she has fist size clots regularly.  She has not been taking iron supplementation as it was previously prescribed for her by GI and she has not followed up with him in a long time.  She endorses palpitations.   Assessment/plan: CBC and iron panel obtained at this visit and results indicate normal hemoglobin, however iron saturation is 6%.  This correlates with her rather severe symptoms.   -Start polysaccharide iron supplementation daily -Consider repeating iron studies in 2 weeks

## 2020-04-03 NOTE — Progress Notes (Signed)
Internal Medicine Clinic Attending  Case discussed with Dr. Basaraba  At the time of the visit.  We reviewed the resident's history and exam and pertinent patient test results.  I agree with the assessment, diagnosis, and plan of care documented in the resident's note.  

## 2020-04-20 ENCOUNTER — Encounter: Admitting: Internal Medicine

## 2020-05-12 ENCOUNTER — Ambulatory Visit
Admission: EM | Admit: 2020-05-12 | Discharge: 2020-05-12 | Discharge status: Against Medical Advice | Attending: Emergency Medicine | Admitting: Emergency Medicine

## 2020-05-12 ENCOUNTER — Telehealth: Admitting: Nurse Practitioner

## 2020-05-12 ENCOUNTER — Telehealth: Admitting: Physician Assistant

## 2020-05-12 ENCOUNTER — Other Ambulatory Visit: Payer: Self-pay

## 2020-05-12 DIAGNOSIS — L0291 Cutaneous abscess, unspecified: Secondary | ICD-10-CM | POA: Diagnosis not present

## 2020-05-12 DIAGNOSIS — B370 Candidal stomatitis: Secondary | ICD-10-CM | POA: Diagnosis not present

## 2020-05-12 MED ORDER — NYSTATIN 100000 UNIT/ML MT SUSP: 5.0000 mL | Freq: Four times a day (QID) | OROMUCOSAL | 1 refills | Status: DC

## 2020-05-12 MED ORDER — DOXYCYCLINE HYCLATE 100 MG PO CAPS: 100.0000 mg | ORAL_CAPSULE | Freq: Two times a day (BID) | ORAL | 0 refills | Status: DC

## 2020-05-12 NOTE — ED Provider Notes (Signed)
[facility]    CSN: [id number] Arrival date & time: 05/12/20  1335      History   Chief Complaint Chief Complaint  Patient presents with  . Abscess    HPI Karen [name] is a 44 y.o. female  With history as below presenting for bilateral axillary and vulvar boils.  Patient states that she did E visit and they told her to be seen.  Denies fever, thrashes, myalgias, active drainage.  Endorsing history of this.  Past Medical History:  Diagnosis Date  . Atypical chest pain    a. Cath 11/2017 - R/LHC showing 30% mid LAD, otherwise no significant disease, EF 55-65%, normal LVEDP, normal heart pressures (no evidence of significant CAD or CHF).  . COPD (chronic obstructive pulmonary disease) (HCC)   . Diabetes mellitus (HCC) 04/17/2017  . Fibromyalgia syndrome 04/17/2017  . GERD (gastroesophageal reflux disease) 04/17/2017  . Hypertension   . Hypothyroidism 04/17/2017  . 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 Obstructive sleep apnea 09/26/2017  . Recurrent syncope   . Tobacco use 11/15/2017    Patient Active Problem List   Diagnosis Date Noted  . Gastroparesis 03/30/2020  . Glossitis 03/30/2020  . Sensorineural hearing loss (SNHL), bilateral 10/24/2019  . Onychomycosis 10/01/2019  . Pain due to dental caries 10/01/2019  . Hearing loss 08/27/2019  . Boil of buttock 06/04/2019  . Healthcare maintenance 06/04/2019  . 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 Dizziness 08/14/2018  . Iron deficiency anemia 08/14/2018  . Morbid obesity (HCC) 05/15/2018  . 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 (HFpEF) heart failure with preserved ejection fraction (HCC) 11/14/2017  . Obstructive sleep apnea 09/26/2017  . Hypothyroidism 04/17/2017  . Diabetes mellitus (HCC) 04/17/2017  . Hypertension associated with diabetes (HCC) 04/17/2017  . COPD (chronic obstructive pulmonary disease) (HCC) 04/17/2017  . GERD (gastroesophageal reflux disease) 04/17/2017  . Manic depression (HCC)  04/17/2017  . Fibromyalgia syndrome 04/17/2017    Past Surgical History:  Procedure Laterality Date  . btl    . CHOLECYSTECTOMY    . RIGHT/LEFT HEART CATH AND CORONARY ANGIOGRAPHY N/A 12/07/2017   Procedure: RIGHT/LEFT HEART CATH AND CORONARY ANGIOGRAPHY;  Surgeon: Marykay Lex, MD;  Location[facility] INVASIVE CV LAB;  Service: Cardiovascular;  Laterality: N/A;  . shoulder Bilateral     OB History   No obstetric history on file.      Home Medications    Prior to Admission medications   Medication Sig Start Date End Date Taking? Authorizing Provider  albuterol (VENTOLIN HFA) 108 (90 Base) MCG/ACT inhaler Inhale 2 puffs into the lungs every 4 (four) hours as needed for wheezing or shortness of breath. 10/24/19   Jenell Milliner, MD  amLODipine (NORVASC) 10 MG tablet Take 1 tablet (10 mg total) by mouth daily. 10/24/19   Jenell Milliner, MD  ARIPiprazole (ABILIFY) 10 MG tablet Take 1 tablet (10 mg total) by mouth at bedtime. 10/24/19   Jenell Milliner, MD  Blood Glucose Monitoring Suppl (FREESTYLE LITE) DEVI Use to check blood sugar up to 3 times a day 10/24/19   Jenell Milliner, MD  cetirizine (ZYRTEC ALLERGY) 10 MG tablet Take 1 tablet (10 mg total) by mouth daily. 02/05/20   Wallis Bamberg, PA-C  divalproex (DEPAKOTE) 250 MG DR tablet Take 1 tablet (250 mg total) by mouth 2 (two) times daily. 10/24/19   Jenell Milliner, MD  doxycycline (VIBRAMYCIN) 100 MG capsule Take 1 capsule (100 mg total) by mouth 2 (two) times daily. 05/12/20   Hall-Potvin, [place], PA-C  empagliflozin (JARDIANCE) 10 MG TABS tablet Take 1 tablet (10 mg total) by mouth daily. 10/24/19   Jenell MillinerLanier, Claire, MD  exenatide (BYETTA 10 MCG PEN) 10 MCG/0.04ML SOPN injection Inject 0.04 mLs (10 mcg total) into the skin 2 (two) times daily with a meal. 10/24/19   Jenell MillinerLanier, Claire, MD  FLUoxetine (PROZAC) 20 MG tablet Take 1 tablet (20 mg total) by mouth daily. 10/24/19   Jenell MillinerLanier, Claire, MD  fluticasone [facility](FLONASE) 50 MCG/ACT nasal spray Place 1-2  sprays into both nostrils daily for 7 days. 10/24/19 10/31/19  Jenell MillinerLanier, Claire, MD  gabapentin (NEURONTIN) 300 MG capsule Take 3 capsules (900 mg total) by mouth 2 (two) times daily. 10/24/19   Jenell MillinerLanier, Claire, MD  glucose blood (FREESTYLE LITE) test strip Use to check blood sugar 3 times daily. diag code E11.9. insulin dependent 10/24/19   Jenell MillinerLanier, Claire, MD  insulin aspart (NOVOLOG FLEXPEN) 100 UNIT/ML FlexPen Inject 10 Units into the skin 3 (three) times daily with meals. 10/24/19   Jenell MillinerLanier, Claire, MD  insulin detemir (LEVEMIR FLEXPEN) 100 UNIT/ML FlexPen Inject 40 Units into the skin daily at 10 pm. IM program 10/24/19   Jenell MillinerLanier, Claire, MD  Insulin Pen Needle (NOVOFINE PLUS) 32G X 4 MM MISC 1 Dose by Does not apply route 4 (four) times daily. Use with Levemir and Novolog, IM program 10/24/19   Jenell MillinerLanier, Claire, MD  iron polysaccharides (NIFEREX) 150 MG capsule Take 1 capsule (150 mg total) by mouth daily. 03/30/20   Verdene LennertBasaraba, Iulia, MD  Lancets (FREESTYLE) lancets Use to check blood sugar 3 times daily. diag code E11.9. insulin dependent 10/24/19   Jenell MillinerLanier, Claire, MD  levothyroxine (SYNTHROID) 150 MCG tablet Take 1 tablet (150 mcg total) by mouth daily before breakfast. 10/24/19   Jenell MillinerLanier, Claire, MD  lidocaine (XYLOCAINE) 2 % solution Use as directed 15 mLs in the mouth or throat every 6 (six) hours as needed for mouth pain. 03/30/20   Verdene LennertBasaraba, Iulia, MD  lisinopril (ZESTRIL) 40 MG tablet Take 1 tablet (40 mg total) by mouth daily. 10/24/19   Jenell MillinerLanier, Claire, MD  lovastatin (MEVACOR) 40 MG tablet Take 1 tablet (40 mg total) by mouth at bedtime. 10/24/19   Jenell MillinerLanier, Claire, MD  meclizine (ANTIVERT) 25 MG tablet Take 1 tablet (25 mg total) by mouth 3 (three) times daily as needed for dizziness. 10/24/19   Jenell MillinerLanier, Claire, MD  metFORMIN (GLUCOPHAGE) 500 MG tablet Take 2 tablets (1,000 mg total) by mouth 2 (two) times daily with a meal. 10/24/19   Jenell MillinerLanier, Claire, MD  methylphenidate (RITALIN) 20 MG tablet Take 1 tablet (20  mg total) by mouth 2 (two) times daily with breakfast and lunch. Take in the morning and at lunch, no later than 3 PM. 02/27/20   Agyei, Hermina Staggersbed K, MD  montelukast (SINGULAIR) 10 MG tablet Take 1 tablet (10 mg total) by mouth at bedtime. 10/24/19   Jenell MillinerLanier, Claire, MD  naproxen (NAPROSYN) 500 MG tablet Take 1 tablet (500 mg total) by mouth 2 (two) times daily with a meal. 02/11/20 02/10/21  Evlyn KannerBraswell, Phillip, MD  nystatin (MYCOSTATIN) 100000 UNIT/ML suspension Take 5 mLs (500,000 Units total) by mouth 4 (four) times daily. 05/12/20   Daphine DeutscherMartin, Mary-Margaret, FNP  omeprazole (PRILOSEC) 40 MG capsule Take 1 capsule (40 mg total) by mouth 2 (two) times daily at 8 am and 10 pm. 10/24/19   Jenell MillinerLanier, Claire, MD  ondansetron (ZOFRAN-ODT) 4 MG disintegrating tablet Take 1 tablet (4 mg total) by mouth every 8 (eight) hours as needed for nausea  or vomiting. 10/24/19   Jenell Milliner, MD  prazosin (MINIPRESS) 1 MG capsule Take 1 capsule (1 mg total) by mouth at bedtime. 10/24/19   Jenell Milliner, MD  sucralfate (CARAFATE) 1 g tablet Take 1 tablet (1 g total) by mouth 4 (four) times daily -  with meals and at bedtime for 7 days. 02/11/20 02/18/20  Evlyn Kanner, MD  triamcinolone cream (KENALOG) 0.1 % Apply 1 application topically 2 (two) times daily. 10/24/19   Jenell Milliner, MD  vitamin E 180 MG (400 UNITS) capsule Take 1 capsule (400 Units total) by mouth daily. 02/27/20   Yvette Rack, MD    Family History Family History  Problem Relation Age of Onset  . Hypertension Mother   . Syncope episode Mother   . Heart disease Maternal Grandmother        unclear details    Social History Social History   Tobacco Use  . Smoking status: Former Smoker    Packs/day: 0.30    Types: Cigarettes    Quit date: 10/29/2017    Years since quitting: 2.5  . Smokeless tobacco: Never Used  Vaping Use  . Vaping Use: Never used  Substance Use Topics  . Alcohol use: Yes    Comment: rarely  . Drug use: No     Allergies    Aspirin and Tylenol [acetaminophen]   Review of Systems Review of Systems  Constitutional: Negative for fatigue and fever.  HENT: Negative for ear pain, sinus pain, sore throat and voice change.   Eyes: Negative for pain, redness and visual disturbance.  Respiratory: Negative for cough and shortness of breath.   Cardiovascular: Negative for chest pain and palpitations.  Gastrointestinal: Negative for abdominal pain, diarrhea and vomiting.  Musculoskeletal: Negative for arthralgias and myalgias.  Skin: Positive for wound. Negative for rash.  Neurological: Negative for syncope and headaches.     Physical Exam Triage Vital Signs ED Triage Vitals  Enc Vitals Group     BP 05/12/20 1349 120/86     Pulse Rate 05/12/20 1349 (!) 103     Resp 05/12/20 1349 20     Temp 05/12/20 1349 98.4 F (36.9 C)     Temp Source 05/12/20 1349 Oral     SpO2 05/12/20 1349 95 %     Weight 05/12/20 1347 235 lb (106.6 kg)     Height 05/12/20 1347 5\' 7"  (1.702 m)     Head Circumference --      Peak Flow --      Pain Score 05/12/20 1347 9     Pain Loc --      Pain Edu? --      Excl. in GC? --    No data found.  Updated Vital Signs BP 120/86 (BP Location: Left Arm)   Pulse (!) 103   Temp 98.4 F (36.9 C) (Oral)   Resp 20   Ht 5\' 7"  (1.702 m)   Wt 235 lb (106.6 kg)   LMP 05/05/2020   SpO2 95%   BMI 36.81 kg/m   Visual Acuity Right Eye Distance:   Left Eye Distance:   Bilateral Distance:    Right Eye Near:   Left Eye Near:    Bilateral Near:     Physical Exam Constitutional:      General: She is not in acute distress. HENT:     Head: Normocephalic and atraumatic.  Eyes:     General: No scleral icterus.    Pupils: Pupils are equal, round,  and reactive to light.  Cardiovascular:     Rate and Rhythm: Normal rate.  Pulmonary:     Effort: Pulmonary effort is normal.  Genitourinary:    Comments: Pt declined Skin:    Coloration: Skin is not jaundiced or pale.     Comments:  Lateral axillary induration that is extensive with few, scattered papular lesions.  Tender and warm without overlying erythema.  No fluctuance, discharge.  Neurological:     Mental Status: She is alert and oriented to person, place, and time.      UC Treatments / Results  Labs (all labs ordered are listed, but only abnormal results are displayed) Labs Reviewed - No data to display  EKG   Radiology No results found.  Procedures Procedures (including critical care time)  Medications Ordered in UC Medications - No data to display  Initial Impression / Assessment and Plan / UC Course  I have reviewed the triage vital signs and the nursing notes.  Pertinent labs & imaging results that were available during my care of the patient were reviewed by me and considered in my medical decision making (see chart for details).     Will defer I&D given habitus, extensive induration, lack of fluctuance, and likelihood of multiple loculations.  Return precautions discussed, pt verbalized understanding and is agreeable to plan. Final Clinical Impressions(s) / UC Diagnoses   Final diagnoses:  Abscess     Discharge Instructions     Keep area(s) clean and dry. Apply hot compress / towel for 5-10 minutes 3-5 times daily. Take antibiotic as prescribed with food - important to complete course. Return for worsening pain, redness, swelling, discharge, fever.  Helpful prevention tips: Keep nails short to avoid secondary skin infections. Use new, clean razors when shaving. Avoid antiperspirants - look for deodorants without aluminum. Avoid wearing underwire bras as this can irritate the area further.     ED Prescriptions    Medication Sig Dispense Auth. Provider   doxycycline (VIBRAMYCIN) 100 MG capsule Take 1 capsule (100 mg total) by mouth 2 (two) times daily. 20 capsule Hall-Potvin, [place], PA-C     PDMP not reviewed this encounter.   Hall-Potvin, [place], [place] 05/12/20  1549

## 2020-05-12 NOTE — ED Triage Notes (Signed)
Pt c/o multiple bilat axilla abscesses and an abscess to vaginal area-states she "did and e-visit and they told me to come in"-NAD-steady gait

## 2020-05-12 NOTE — Discharge Instructions (Signed)
Keep area(s) clean and dry. °Apply hot compress / towel for 5-10 minutes 3-5 times daily. °Take antibiotic as prescribed with food - important to complete course. °Return for worsening pain, redness, swelling, discharge, fever. ° °Helpful prevention tips: °Keep nails short to avoid secondary skin infections. °Use new, clean razors when shaving. °Avoid antiperspirants - look for deodorants without aluminum. °Avoid wearing underwire bras as this can irritate the area further.  °

## 2020-05-12 NOTE — Progress Notes (Signed)
Based on what you shared with me, I feel your condition warrants further evaluation and I recommend that you be seen for a face to face office visit. Given the length of time and possibility of the need for drainage of the abscess you need a face to face evaluation.   NOTE: If you entered your credit card information for this eVisit, you will not be charged. You may see a "hold" on your card for the $35 but that hold will drop off and you will not have a charge processed.   If you are having a true medical emergency please call 911.      For an urgent face to face visit, [place] has five urgent care centers for your convenience:     [facility] at [facility] Directions [phone] [address] [place], [place] [address] . 10 am - 6pm Monday - Friday    [facility]) Get Driving Directions [phone] [address] [place], [place] [address] . 10 am to 8 pm Monday-Friday . 12 pm to 8 pm [facility]gent Care at [facility] Get Driving Directions [phone] [address] [place], [place] [address] . 8 am to 8 pm Monday-Friday . 9 am to 6 pm Saturday . 11 am to 6 pm Sunday     [facility] at [facility] Get Driving Directions  [phone] [address] [place], [place] [address] . 8 am to 8 pm Monday-Friday . 8 am to 4 pm [facility]gent Care at [facility] Directions [phone] [address] [place], [place] [address] . 12 pm to 6 pm Monday-Friday      Your e-visit answers were reviewed by a board certified advanced clinical practitioner to complete your personal care plan.  Thank you for using e-Visits.   Greater than 5 minutes, yet less than 10 minutes of time have been spent researching, coordinating, and implementing care for this patient today

## 2020-05-12 NOTE — Progress Notes (Signed)
E-Visit for Mouth Ulcers  We are sorry that you are not feeling well.  Here is how we plan to help!  Based on what you have shared with me, it appears that you do have thrush, thrush is a yeast fungus that grows in the oral cavity.  The following medications should decrease the discomfort and help with healing. Nystatin swish and swallow 25ml 4x a day.  Mouth ulcers are painful areas in the mouth and gums. These are also known as "canker sores".  They can occur anywhere inside the mouth. While mostly harmless, mouth ulcers can be extremely uncomfortable and may make it difficult to eat, drink, and brush your teeth.  You may have more than 1 ulcer and they can vary and change in size. Mouth ulcers are not contagious and should not be confused with cold sores.  Cold sores appear on the lip or around the outside of the mouth and often begin with a tingling, burning or itching sensation.   While the exact causes are unknown, some common causes and factors that may aggravate mouth ulcers include: . Genetics - Sometimes mouth ulcers run in families . High alcohol intake . Acidic foods such as citrus fruits like pineapple, grapefruit, orange fruits/juices, may aggravate mouth ulcers . Other foods high in acidity or spice such as coffee, chocolate, chips, pretzels, eggs, nuts, cheese . Quitting smoking . Injury caused by biting the tongue or inside of the cheek . Diet lacking in B-12, zinc, folic acid or iron . Female hormone shifts with menstruation . Excessive fatigue, emotional stress or anxiety Prevention: . Talk to your doctor if you are taking meds that are known to cause mouth ulcers such as:   Anti-inflammatory drugs (for example Ibuprofen, Naproxen sodium), pain killers, Beta blockers, Oral nicotine replacement drugs, Some street drugs (heroin).   . Avoid allowing any tablets to dissolve in your mouth that are meant to swallowed whole . Avoid foods/drinks that trigger or worsen  symptoms . Keep your mouth clean with daily brushing and flossing  Home Care: . The goal with treatment is to ease the pain where ulcers occur and help them heal as quickly as possible.  There is no medical treatment to prevent mouth ulcers from coming back or recurring.  . Avoid spicy and acidic foods . Eat soft foods and avoid rough, crunchy foods . Avoid chewing gum . Do not use toothpaste that contains sodium lauryl sulphite . Use a straw to drink which helps avoid liquids toughing the ulcers near the front of your mouth . Use a very soft toothbrush . If you have dentures or dental hardware that you feel is not fitting well or contributing to his, please see your dentist. . Use saltwater mouthwash which helps healing. Dissolve a  teaspoon of salt in a glass of warm water. Swish around your mouth and spit it out. This can be used as needed if it is soothing.   GET HELP RIGHT AWAY IF: . Persistent ulcers require checking IN PERSON (face to face). Any mouth lesion lasting longer than a month should be seen by your DENTIST as soon as possible for evaluation for possible oral cancer. . If you have a non-painful ulcer in 1 or more areas of your mouth . Ulcers that are spreading, are very large or particularly painful . Ulcers last longer than one week without improving on treatment . If you develop a fever, swollen glands and begin to feel unwell . Ulcers that developed after  starting a new medication MAKE SURE YOU:  Understand these instructions.  Will watch your condition.  Will get help right away if you are not doing well or get worse.  Your e-visit answers were reviewed by a board certified advanced clinical practitioner to complete your personal care plan.  Depending upon the condition, your plan could have included both over the counter or prescription medications.    Please review your pharmacy choice.  Be sure that the pharmacy you have chosen is open so that you can pick up  your prescription now.  If there is a problem, you can message your provider in MyChart to have the prescription routed to another pharmacy.    Your safety is important to [place].  If you have drug allergies check our prescription carefully.  For the next 24 hours you can use MyChart to ask questions about today's visit, request a non-urgent call back, or ask for a work or school excuse from your e-visit provider.  You will get an email with a survey asking about your experience and to give [place] any feedback.  I hope that your e-visit has been valuable and will speed your recovery.  5-10 minutes spent reviewing and documenting in chart.

## 2020-06-12 ENCOUNTER — Encounter: Payer: Self-pay | Admitting: Emergency Medicine

## 2020-06-12 ENCOUNTER — Ambulatory Visit
Admission: EM | Admit: 2020-06-12 | Discharge: 2020-06-12 | Disposition: A | Discharge status: Home / Self Care | Attending: Emergency Medicine | Admitting: Emergency Medicine

## 2020-06-12 ENCOUNTER — Other Ambulatory Visit: Payer: Self-pay

## 2020-06-12 DIAGNOSIS — Z1152 Encounter for screening for COVID-19: Secondary | ICD-10-CM | POA: Diagnosis not present

## 2020-06-12 DIAGNOSIS — J0101 Acute recurrent maxillary sinusitis: Secondary | ICD-10-CM

## 2020-06-12 MED ORDER — AMOXICILLIN-POT CLAVULANATE 875-125 MG PO TABS: 1.0000 | ORAL_TABLET | Freq: Two times a day (BID) | ORAL | 0 refills | Status: DC

## 2020-06-12 MED ORDER — FLUCONAZOLE 200 MG PO TABS: 200.0000 mg | ORAL_TABLET | Freq: Once | ORAL | 0 refills | Status: AC

## 2020-06-12 NOTE — ED Triage Notes (Signed)
Pt said she has been having nasal congestion, cough and very fatigue since January 6thh. OTC medication is not working. No fevers,Genralized body aches

## 2020-06-12 NOTE — ED Provider Notes (Signed)
[facility]    CSN: [id number] Arrival date & time: 06/12/20  1207      History   Chief Complaint Chief Complaint  Patient presents with  . Cough  . Nasal Congestion  . Generalized Body Aches    HPI Karen [name] is a 45 y.o. female  With history as below presenting for persistent URI symptoms including congestion, cough, fatigue. Symptom onset was 1/6. OTC medications not helping. Denies fever, shortness of breath or chest pain.  Past Medical History:  Diagnosis Date  . Atypical chest pain    a. Cath 11/2017 - R/LHC showing 30% mid LAD, otherwise no significant disease, EF 55-65%, normal LVEDP, normal heart pressures (no evidence of significant CAD or CHF).  . COPD (chronic obstructive pulmonary disease) (HCC)   . Diabetes mellitus (HCC) 04/17/2017  . Fibromyalgia syndrome 04/17/2017  . GERD (gastroesophageal reflux disease) 04/17/2017  . Hypertension   . Hypothyroidism 04/17/2017  . 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 Obstructive sleep apnea 09/26/2017  . Recurrent syncope   . Tobacco use 11/15/2017    Patient Active Problem List   Diagnosis Date Noted  . Gastroparesis 03/30/2020  . Glossitis 03/30/2020  . Sensorineural hearing loss (SNHL), bilateral 10/24/2019  . Onychomycosis 10/01/2019  . Pain due to dental caries 10/01/2019  . Hearing loss 08/27/2019  . Boil of buttock 06/04/2019  . Healthcare maintenance 06/04/2019  . 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 Dizziness 08/14/2018  . Iron deficiency anemia 08/14/2018  . Morbid obesity (HCC) 05/15/2018  . 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 (HFpEF) heart failure with preserved ejection fraction (HCC) 11/14/2017  . Obstructive sleep apnea 09/26/2017  . Hypothyroidism 04/17/2017  . Diabetes mellitus (HCC) 04/17/2017  . Hypertension associated with diabetes (HCC) 04/17/2017  . COPD (chronic obstructive pulmonary disease) (HCC) 04/17/2017  . GERD (gastroesophageal reflux disease) 04/17/2017   . 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 Fibromyalgia syndrome 04/17/2017    Past Surgical History:  Procedure Laterality Date  . btl    . CHOLECYSTECTOMY    . RIGHT/LEFT HEART CATH AND CORONARY ANGIOGRAPHY N/A 12/07/2017   Procedure: RIGHT/LEFT HEART CATH AND CORONARY ANGIOGRAPHY;  Surgeon: Marykay Lex, MD;  Location: [facility] INVASIVE CV LAB;  Service: Cardiovascular;  Laterality: N/A;  . shoulder Bilateral     OB History   No obstetric history on file.      Home Medications    Prior to Admission medications   Medication Sig Start Date End Date Taking? Authorizing Provider  amoxicillin-clavulanate (AUGMENTIN) 875-125 MG tablet Take 1 tablet by mouth every 12 (twelve) hours. 06/12/20  Yes Hall-Potvin, [place], PA-C  fluconazole (DIFLUCAN) 200 MG tablet Take 1 tablet (200 mg total) by mouth once for 1 dose. May repeat in 72 hours if needed 06/12/20 06/12/20 Yes Hall-Potvin, [place], PA-C  albuterol (VENTOLIN HFA) 108 (90 Base) MCG/ACT inhaler Inhale 2 puffs into the lungs every 4 (four) hours as needed for wheezing or shortness of breath. 10/24/19   Jenell Milliner, MD  amLODipine (NORVASC) 10 MG tablet Take 1 tablet (10 mg total) by mouth daily. 10/24/19   Jenell Milliner, MD  ARIPiprazole (ABILIFY) 10 MG tablet Take 1 tablet (10 mg total) by mouth at bedtime. 10/24/19   Jenell Milliner, MD  Blood Glucose Monitoring Suppl (FREESTYLE LITE) DEVI Use to check blood sugar up to 3 times a day 10/24/19   Jenell Milliner, MD  cetirizine (ZYRTEC ALLERGY) 10 MG tablet Take 1 tablet (10 mg total) by mouth daily. 02/05/20  Wallis Bamberg, PA-C  divalproex (DEPAKOTE) 250 MG DR tablet Take 1 tablet (250 mg total) by mouth 2 (two) times daily. 10/24/19   Jenell Milliner, MD  doxycycline (VIBRAMYCIN) 100 MG capsule Take 1 capsule (100 mg total) by mouth 2 (two) times daily. 05/12/20   Hall-Potvin, [place], PA-C  empagliflozin (JARDIANCE) 10 MG TABS tablet Take 1 tablet (10 mg total) by mouth daily. 10/24/19    Jenell Milliner, MD  exenatide (BYETTA 10 MCG PEN) 10 MCG/0.04ML SOPN injection Inject 0.04 mLs (10 mcg total) into the skin 2 (two) times daily with a meal. 10/24/19   Jenell Milliner, MD  FLUoxetine (PROZAC) 20 MG tablet Take 1 tablet (20 mg total) by mouth daily. 10/24/19   Jenell Milliner, MD  fluticasone [facility]) 50 MCG/ACT nasal spray Place 1-2 sprays into both nostrils daily for 7 days. 10/24/19 10/31/19  Jenell Milliner, MD  gabapentin (NEURONTIN) 300 MG capsule Take 3 capsules (900 mg total) by mouth 2 (two) times daily. 10/24/19   Jenell Milliner, MD  glucose blood (FREESTYLE LITE) test strip Use to check blood sugar 3 times daily. diag code E11.9. insulin dependent 10/24/19   Jenell Milliner, MD  insulin aspart (NOVOLOG FLEXPEN) 100 UNIT/ML FlexPen Inject 10 Units into the skin 3 (three) times daily with meals. 10/24/19   Jenell Milliner, MD  insulin detemir (LEVEMIR FLEXPEN) 100 UNIT/ML FlexPen Inject 40 Units into the skin daily at 10 pm. IM program 10/24/19   Jenell Milliner, MD  Insulin Pen Needle (NOVOFINE PLUS) 32G X 4 MM MISC 1 Dose by Does not apply route 4 (four) times daily. Use with Levemir and Novolog, IM program 10/24/19   Jenell Milliner, MD  iron polysaccharides (NIFEREX) 150 MG capsule Take 1 capsule (150 mg total) by mouth daily. 03/30/20   Verdene Lennert, MD  Lancets (FREESTYLE) lancets Use to check blood sugar 3 times daily. diag code E11.9. insulin dependent 10/24/19   Jenell Milliner, MD  levothyroxine (SYNTHROID) 150 MCG tablet Take 1 tablet (150 mcg total) by mouth daily before breakfast. 10/24/19   Jenell Milliner, MD  lidocaine (XYLOCAINE) 2 % solution Use as directed 15 mLs in the mouth or throat every 6 (six) hours as needed for mouth pain. 03/30/20   Verdene Lennert, MD  lisinopril (ZESTRIL) 40 MG tablet Take 1 tablet (40 mg total) by mouth daily. 10/24/19   Jenell Milliner, MD  lovastatin (MEVACOR) 40 MG tablet Take 1 tablet (40 mg total) by mouth at bedtime. 10/24/19   Jenell Milliner, MD   meclizine (ANTIVERT) 25 MG tablet Take 1 tablet (25 mg total) by mouth 3 (three) times daily as needed for dizziness. 10/24/19   Jenell Milliner, MD  metFORMIN (GLUCOPHAGE) 500 MG tablet Take 2 tablets (1,000 mg total) by mouth 2 (two) times daily with a meal. 10/24/19   Jenell Milliner, MD  methylphenidate (RITALIN) 20 MG tablet Take 1 tablet (20 mg total) by mouth 2 (two) times daily with breakfast and lunch. Take in the morning and at lunch, no later than 3 PM. 02/27/20   Agyei, Hermina Staggers, MD  montelukast (SINGULAIR) 10 MG tablet Take 1 tablet (10 mg total) by mouth at bedtime. 10/24/19   Jenell Milliner, MD  naproxen (NAPROSYN) 500 MG tablet Take 1 tablet (500 mg total) by mouth 2 (two) times daily with a meal. 02/11/20 02/10/21  Evlyn Kanner, MD  nystatin (MYCOSTATIN) 100000 UNIT/ML suspension Take 5 mLs (500,000 Units total) by mouth 4 (four) times daily. 05/12/20   Bennie Pierini, FNP  omeprazole (PRILOSEC) 40 MG capsule Take 1 capsule (40 mg total) by mouth 2 (two) times daily at 8 am and 10 pm. 10/24/19   Jenell Milliner, MD  ondansetron (ZOFRAN-ODT) 4 MG disintegrating tablet Take 1 tablet (4 mg total) by mouth every 8 (eight) hours as needed for nausea or vomiting. 10/24/19   Jenell Milliner, MD  prazosin (MINIPRESS) 1 MG capsule Take 1 capsule (1 mg total) by mouth at bedtime. 10/24/19   Jenell Milliner, MD  sucralfate (CARAFATE) 1 g tablet Take 1 tablet (1 g total) by mouth 4 (four) times daily -  with meals and at bedtime for 7 days. 02/11/20 02/18/20  Evlyn Kanner, MD  triamcinolone cream (KENALOG) 0.1 % Apply 1 application topically 2 (two) times daily. 10/24/19   Jenell Milliner, MD  vitamin E 180 MG (400 UNITS) capsule Take 1 capsule (400 Units total) by mouth daily. 02/27/20   Yvette Rack, MD    Family History Family History  Problem Relation Age of Onset  . Hypertension Mother   . Syncope episode Mother   . Heart disease Maternal Grandmother        unclear details    Social  History Social History   Tobacco Use  . Smoking status: Former Smoker    Packs/day: 0.30    Types: Cigarettes    Quit date: 10/29/2017    Years since quitting: 2.6  . Smokeless tobacco: Never Used  Vaping Use  . Vaping Use: Never used  Substance Use Topics  . Alcohol use: Yes    Comment: rarely  . Drug use: No     Allergies   Aspirin and Tylenol [acetaminophen]   Review of Systems Review of Systems  Constitutional: Positive for fatigue. Negative for fever.  HENT: Positive for congestion, ear pain, sinus pressure and sinus pain. Negative for dental problem, facial swelling, hearing loss, sore throat, trouble swallowing and voice change.   Eyes: Negative for photophobia, pain and visual disturbance.  Respiratory: Positive for cough. Negative for shortness of breath.   Cardiovascular: Negative for chest pain and palpitations.  Gastrointestinal: Negative for diarrhea and vomiting.  Musculoskeletal: Negative for arthralgias and myalgias.  Neurological: Negative for dizziness and headaches.     Physical Exam Triage Vital Signs ED Triage Vitals  Enc Vitals Group     BP 06/12/20 1307 138/85     Pulse Rate 06/12/20 1307 89     Resp 06/12/20 1307 18     Temp 06/12/20 1307 98 F (36.7 C)     Temp Source 06/12/20 1307 Oral     SpO2 06/12/20 1307 95 %     Weight --      Height --      Head Circumference --      Peak Flow --      Pain Score 06/12/20 1222 10     Pain Loc --      Pain Edu? --      Excl. in GC? --    No data found.  Updated Vital Signs BP 138/85 (BP Location: Right Arm)   Pulse 89   Temp 98 F (36.7 C) (Oral)   Resp 18   LMP 05/26/2020   SpO2 95%   Visual Acuity Right Eye Distance:   Left Eye Distance:   Bilateral Distance:    Right Eye Near:   Left Eye Near:    Bilateral Near:     Physical Exam Constitutional:      General: She is not in  acute distress.    Appearance: She is not ill-appearing or diaphoretic.  HENT:     Head:  Normocephalic and atraumatic.     Right Ear: Tympanic membrane and ear canal normal.     Left Ear: Tympanic membrane and ear canal normal.     Nose:     Comments: Bilateral maxillary sinus tenderness    Mouth/Throat:     Mouth: Mucous membranes are moist.     Pharynx: Oropharynx is clear. No oropharyngeal exudate or posterior oropharyngeal erythema.  Eyes:     General: No scleral icterus.    Conjunctiva/sclera: Conjunctivae normal.     Pupils: Pupils are equal, round, and reactive to light.  Neck:     Comments: Trachea midline, negative JVD Cardiovascular:     Rate and Rhythm: Normal rate and regular rhythm.     Heart sounds: No murmur heard. No gallop.   Pulmonary:     Effort: Pulmonary effort is normal. No respiratory distress.     Breath sounds: No wheezing, rhonchi or rales.  Musculoskeletal:     Cervical back: Neck supple. No tenderness.  Lymphadenopathy:     Cervical: No cervical adenopathy.  Skin:    Capillary Refill: Capillary refill takes less than 2 seconds.     Coloration: Skin is not jaundiced or pale.     Findings: No rash.  Neurological:     General: No focal deficit present.     Mental Status: She is alert and oriented to person, place, and time.      UC Treatments / Results  Labs (all labs ordered are listed, but only abnormal results are displayed) Labs Reviewed  NOVEL CORONAVIRUS, NAA    EKG   Radiology No results found.  Procedures Procedures (including critical care time)  Medications Ordered in UC Medications - No data to display  Initial Impression / Assessment and Plan / UC Course  I have reviewed the triage vital signs and the nursing notes.  Pertinent labs & imaging results that were available during my care of the patient were reviewed by me and considered in my medical decision making (see chart for details).     Patient afebrile, nontoxic, with SpO2 95%.  Covid PCR pending.  Patient to quarantine until results are back.  We  will treat supportively as outlined below, cover for acute recurrent maxillary sinusitis given history and severity of symptoms.  Return precautions discussed, patient verbalized understanding and is agreeable to plan. Final Clinical Impressions(s) / UC Diagnoses   Final diagnoses:  Encounter for screening for COVID-19  Acute recurrent maxillary sinusitis   Discharge Instructions   None    ED Prescriptions    Medication Sig Dispense Auth. Provider   amoxicillin-clavulanate (AUGMENTIN) 875-125 MG tablet Take 1 tablet by mouth every 12 (twelve) hours. 14 tablet Hall-Potvin, [place], PA-C   fluconazole (DIFLUCAN) 200 MG tablet Take 1 tablet (200 mg total) by mouth once for 1 dose. May repeat in 72 hours if needed 2 tablet Hall-Potvin, [place], PA-C     PDMP not reviewed this encounter.   Hall-Potvin, [place], [place]-C 06/12/20 1521

## 2020-06-14 LAB — SARS-COV-2, NAA 2 DAY TAT

## 2020-06-14 LAB — NOVEL CORONAVIRUS, NAA: SARS-CoV-2, NAA: NOT DETECTED

## 2020-06-16 ENCOUNTER — Other Ambulatory Visit: Payer: Self-pay | Admitting: Internal Medicine

## 2020-06-16 ENCOUNTER — Telehealth: Admitting: Family

## 2020-06-16 DIAGNOSIS — J441 Chronic obstructive pulmonary disease with (acute) exacerbation: Secondary | ICD-10-CM | POA: Diagnosis not present

## 2020-06-16 DIAGNOSIS — B370 Candidal stomatitis: Secondary | ICD-10-CM

## 2020-06-16 MED ORDER — DOXYCYCLINE HYCLATE 100 MG PO TABS: 100.0000 mg | ORAL_TABLET | Freq: Two times a day (BID) | ORAL | 0 refills | Status: DC

## 2020-06-16 MED ORDER — PREDNISONE 10 MG (21) PO TBPK: ORAL_TABLET | ORAL | 0 refills | Status: DC

## 2020-06-16 MED ORDER — BENZONATATE 100 MG PO CAPS: 100.0000 mg | ORAL_CAPSULE | Freq: Three times a day (TID) | ORAL | 0 refills | Status: DC | PRN

## 2020-06-16 NOTE — Progress Notes (Signed)
We are sorry that you are not feeling well.  Here is how we plan to help!  Based on your presentation I believe you most likely have A cough due to bacteria.  When patients have a fever and a productive cough with a change in color or increased sputum production, we are concerned about bacterial bronchitis.  If left untreated it can progress to pneumonia.  If your symptoms do not improve with your treatment plan it is important that you contact your provider.   I have prescribed Doxycycline 100 mg twice a day for 7 days .    In addition you may use A non-prescription cough medication called Robitussin DAC. Take 2 teaspoons every 8 hours or Delsym: take 2 teaspoons every 12 hours., A non-prescription cough medication called Mucinex DM: take 2 tablets every 12 hours. and A prescription cough medication called Tessalon Perles 100mg . You may take 1-2 capsules every 8 hours as needed for your cough.  Prednisone 10 mg daily for 6 days (see taper instructions below)  Directions for 6 day taper: Day 1: 2 tablets before breakfast, 1 after both lunch & dinner and 2 at bedtime Day 2: 1 tab before breakfast, 1 after both lunch & dinner and 2 at bedtime Day 3: 1 tab at each meal & 1 at bedtime Day 4: 1 tab at breakfast, 1 at lunch, 1 at bedtime Day 5: 1 tab at breakfast & 1 tab at bedtime Day 6: 1 tab at breakfast  I recommend you be seen face to face if your symptoms do not improve in the next 1-2 days. You may need a chest x-ray to rule out pneumonia.     From your responses in the eVisit questionnaire you describe inflammation in the upper respiratory tract which is causing a significant cough.  This is commonly called Bronchitis and has four common causes:    Allergies  Viral Infections  Acid Reflux  Bacterial Infection Allergies, viruses and acid reflux are treated by controlling symptoms or eliminating the cause. An example might be a cough caused by taking certain blood pressure medications.  You stop the cough by changing the medication. Another example might be a cough caused by acid reflux. Controlling the reflux helps control the cough.  USE OF BRONCHODILATOR ("RESCUE") INHALERS: There is a risk from using your bronchodilator too frequently.  The risk is that over-reliance on a medication which only relaxes the muscles surrounding the breathing tubes can reduce the effectiveness of medications prescribed to reduce swelling and congestion of the tubes themselves.  Although you feel brief relief from the bronchodilator inhaler, your asthma may actually be worsening with the tubes becoming more swollen and filled with mucus.  This can delay other crucial treatments, such as oral steroid medications. If you need to use a bronchodilator inhaler daily, several times per day, you should discuss this with your provider.  There are probably better treatments that could be used to keep your asthma under control.     HOME CARE . Only take medications as instructed by your medical team. . Complete the entire course of an antibiotic. . Drink plenty of fluids and get plenty of rest. . Avoid close contacts especially the very young and the elderly . Cover your mouth if you cough or cough into your sleeve. . Always remember to wash your hands . A steam or ultrasonic humidifier can help congestion.   GET HELP RIGHT AWAY IF: . You develop worsening fever. . You become short  of breath . You cough up blood. . Your symptoms persist after you have completed your treatment plan MAKE SURE YOU   Understand these instructions.  Will watch your condition.  Will get help right away if you are not doing well or get worse.  Your e-visit answers were reviewed by a board certified advanced clinical practitioner to complete your personal care plan.  Depending on the condition, your plan could have included both over the counter or prescription medications. If there is a problem please reply  once you have  received a response from your provider. Your safety is important to [place].  If you have drug allergies check your prescription carefully.    You can use MyChart to ask questions about today's visit, request a non-urgent call back, or ask for a work or school excuse for 24 hours related to this e-Visit. If it has been greater than 24 hours you will need to follow up with your provider, or enter a new e-Visit to address those concerns. You will get an e-mail in the next two days asking about your experience.  I hope that your e-visit has been valuable and will speed your recovery. Thank you for using e-visits.  Approximately 5 minutes was spent documenting and reviewing patient's chart.

## 2020-06-17 ENCOUNTER — Other Ambulatory Visit: Payer: Self-pay

## 2020-06-17 ENCOUNTER — Telehealth: Payer: Self-pay | Admitting: *Deleted

## 2020-06-17 ENCOUNTER — Ambulatory Visit (INDEPENDENT_AMBULATORY_CARE_PROVIDER_SITE_OTHER): Admitting: Student

## 2020-06-17 ENCOUNTER — Encounter: Payer: Self-pay | Admitting: Student

## 2020-06-17 DIAGNOSIS — U071 COVID-19: Secondary | ICD-10-CM

## 2020-06-17 DIAGNOSIS — F3132 Bipolar disorder, current episode depressed, moderate: Secondary | ICD-10-CM | POA: Diagnosis not present

## 2020-06-17 MED ORDER — GUAIFENESIN-CODEINE 100-10 MG/5ML PO SOLN: 5.0000 mL | Freq: Four times a day (QID) | ORAL | 0 refills | Status: AC | PRN

## 2020-06-17 NOTE — Patient Instructions (Addendum)
It was a pleasure seeing you in clinic. Today we discussed:   Cough: Please try guaifenesin syrup as needed for your cough. Your symptoms are likely due to your recent COVID infection and will take time to improve in the meantime get plenty of rest and hydration.  You can hold off on taking the doxycyline and prednisone unless you start having symptoms of wheezing  Follow up as needed  If you have any questions or concerns, please call our clinic at [phone] between 9am-5pm and after hours call [phone] and ask for the internal medicine resident on call. If you feel you are having a medical emergency please call 911.   Thank you, I hope you will feel better soon!

## 2020-06-17 NOTE — Telephone Encounter (Signed)
Should be appropriate for in person appointment given her symptoms started on 1/5

## 2020-06-17 NOTE — Telephone Encounter (Signed)
Call from pt - not feeling well since 06/03/20, continues productive cough. Stated she had a covid test done in [place], where she works on 1/10 and did not get results until 1/16 which was positive. Since it took so long to get her results, she had another done and went to UC on 1/14; came back negative, she was prescribed abx's (see 1/14 encounter and E-visit 1/18). She told she may have bacterial bronchitis, to contact her doctor and she may need x-rays. Stated she had her covid vaccines but not the booster. 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 In-person appt given for today @ 2 PM with Dr Elaina Pattee; if not appropriate, let me know.

## 2020-06-18 DIAGNOSIS — U071 COVID-19: Secondary | ICD-10-CM | POA: Insufficient documentation

## 2020-06-18 MED ORDER — VITAMIN E 180 MG (400 UNIT) PO CAPS: 400.0000 [IU] | ORAL_CAPSULE | Freq: Every day | ORAL | 0 refills | Status: AC

## 2020-06-18 NOTE — Progress Notes (Signed)
Internal Medicine Clinic Attending ? ?Case discussed with Dr. Liang  At the time of the visit.  We reviewed the resident?s history and exam and pertinent patient test results.  I agree with the assessment, diagnosis, and plan of care documented in the resident?s note. ? ?

## 2020-06-18 NOTE — Assessment & Plan Note (Addendum)
Patient presents for cough with green sputum for the past 2 weeks. States she originally started feeling unwell on 06/03/2020. She tested positive at outside clinic on 06/08/2020. Went to urgent care on 06/12/2020 for continued symptoms of cough and fatigue and was COVID negative at that time. Yesterday had a televisit and was started on prednisone taper and doxycycline which she has not started. States she continues to have cough with nasal congestion and with generalized weakness since her symptoms started. Has been taking robitussin and delsym which have not helped her cough. Denies fever, chills, SOB, or wheezing. Has not needed to use her inhalor more. On exam patient coughing throughout the interview. Vitals are stable, she is afebrile and maintaining O2 saturations. Lung exam with diffuse crackles no wheezing. Her cough is likely secondary to prior COVID infection and will likely improve with time, may also be COPD exacerbation but feel that this is less likely. Advised she hold off on steroid and antibiotics at this time and start them if she starts having worsening SOB and wheezing.  Plan Guaifenesin-codeine syrup as needed for cough Hold off on steroids and antibiotics unless she develops wheezing Follow up as needed

## 2020-06-18 NOTE — Progress Notes (Signed)
   CC: Cough  HPI:  [name] is a 45 y.o. with history below presents for cough for the past 2 weeks. Patient recently tested for positive for COVID-19 on 06/09/2019. Please refer to problem based charting for further details and assessment and plan of current problem and chronic medical conditions.   Past Medical History:  Diagnosis Date  . Atypical chest pain    a. Cath 11/2017 - R/LHC showing 30% mid LAD, otherwise no significant disease, EF 55-65%, normal LVEDP, normal heart pressures (no evidence of significant CAD or CHF).  . COPD (chronic obstructive pulmonary disease) (HCC)   . Diabetes mellitus (HCC) 04/17/2017  . Fibromyalgia syndrome 04/17/2017  . GERD (gastroesophageal reflux disease) 04/17/2017  . Hypertension   . Hypothyroidism 04/17/2017  . 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 Obstructive sleep apnea 09/26/2017  . Recurrent syncope   . Tobacco use 11/15/2017   Review of Systems:  Negative as per HPI  Physical Exam:  Vitals:   06/17/20 1424 06/17/20 1426 06/17/20 1537  BP:  (!) 145/82 140/80  Pulse:  96 95  Temp:  98.8 F (37.1 C)   TempSrc:  Oral   SpO2:  99% 99%  Weight: 233 lb 6.4 oz (105.9 kg)    Height: 5\' 7"  (1.702 m)     Physical Exam Constitutional:      Appearance: Normal appearance.  HENT:     Head: Normocephalic and atraumatic.     Right Ear: External ear normal.     Left Ear: External ear normal.     Nose: Congestion present.     Mouth/Throat:     Mouth: Mucous membranes are moist.     Pharynx: Oropharynx is clear.  Eyes:     Conjunctiva/sclera: Conjunctivae normal.     Pupils: Pupils are equal, round, and reactive to light.  Cardiovascular:     Rate and Rhythm: Normal rate and regular rhythm.     Pulses: Normal pulses.     Heart sounds: No murmur heard.   Pulmonary:     Effort: Pulmonary effort is normal. No respiratory distress.     Breath sounds: No wheezing or rales.     Comments: Mild difuse crackles Abdominal:      General: Abdomen is flat. Bowel sounds are normal.  Skin:    General: Skin is warm and dry.     Capillary Refill: Capillary refill takes less than 2 seconds.  Neurological:     General: No focal deficit present.     Mental Status: She is alert. Mental status is at baseline.      Assessment & Plan:   See Encounters Tab for problem based charting.  Patient discussed with Dr. 

## 2020-06-18 NOTE — Assessment & Plan Note (Addendum)
Continues to have elevated PHQ-9 today and appears tearful and upset during interview due to worsening cough. Depression does not appear well controlled however she does not want to discuss this further today.

## 2020-08-25 ENCOUNTER — Telehealth: Admitting: Emergency Medicine

## 2020-08-25 DIAGNOSIS — B370 Candidal stomatitis: Secondary | ICD-10-CM

## 2020-08-25 MED ORDER — NYSTATIN 100000 UNIT/ML MT SUSP: 5.0000 mL | Freq: Four times a day (QID) | OROMUCOSAL | 1 refills | Status: DC

## 2020-08-25 NOTE — Progress Notes (Signed)
[name], ohlson are scheduled for a virtual visit with your provider today.    Just as we do with appointments in the office, we must obtain your consent to participate.  Your consent will be active for this visit and any virtual visit you may have with one of our providers in the next 365 days.    If you have a MyChart account, I can also send a copy of this consent to you electronically.  All virtual visits are billed to your insurance company just like a traditional visit in the office.  As this is a virtual visit, video technology does not allow for your provider to perform a traditional examination.  This may limit your provider's ability to fully assess your condition.  If your provider identifies any concerns that need to be evaluated in person or the need to arrange testing such as labs, EKG, etc, we will make arrangements to do so.    Although advances in technology are sophisticated, we cannot ensure that it will always work on either your end or our end.  If the connection with a video visit is poor, we may have to switch to a telephone visit.  With either a video or telephone visit, we are not always able to ensure that we have a secure connection.   I need to obtain your verbal consent now.   Are you willing to proceed with your visit today?   Dotty Gonzalo has provided verbal consent on 08/25/2020 for a virtual visit (video or telephone).   Karen Horseman, PA-C 08/25/2020  11:26 AM     Virtual Visit via Video   I connected with patient on 08/25/20 at 11:30 AM EDT by a video enabled telemedicine application and verified that I am speaking with the correct person using two identifiers.  Location patient: Home Location provider: [facility] Persons participating in the virtual visit: Patient, Provider  I discussed the limitations of evaluation and management by telemedicine and the availability of in person appointments. The patient expressed understanding and  agreed to proceed.  Subjective:   HPI:   Patient presents via Caregility today presents with oral thrush. Reports history of the same.  Denies hx of HIV/AIDs.  Denies current sore throat.  States that she has some canker sores as well.  Denies any other associated problems.  ROS:   See pertinent positives and negatives per HPI.  Patient Active Problem List   Diagnosis Date Noted  . COVID-19 06/18/2020  . Gastroparesis 03/30/2020  . Glossitis 03/30/2020  . Sensorineural hearing loss (SNHL), bilateral 10/24/2019  . Onychomycosis 10/01/2019  . Pain due to dental caries 10/01/2019  . Hearing loss 08/27/2019  . Boil of buttock 06/04/2019  . Healthcare maintenance 06/04/2019  . 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 Dizziness 08/14/2018  . Iron deficiency anemia 08/14/2018  . Morbid obesity (HCC) 05/15/2018  . 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 (HFpEF) heart failure with preserved ejection fraction (HCC) 11/14/2017  . Obstructive sleep apnea 09/26/2017  . Hypothyroidism 04/17/2017  . Diabetes mellitus (HCC) 04/17/2017  . Hypertension associated with diabetes (HCC) 04/17/2017  . COPD (chronic obstructive pulmonary disease) (HCC) 04/17/2017  . GERD (gastroesophageal reflux disease) 04/17/2017  . 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 Fibromyalgia syndrome 04/17/2017    Social History   Tobacco Use  . Smoking status: Former Smoker    Packs/day: 0.30    Types: Cigarettes    Quit date: 10/29/2017    Years since quitting: 2.8  .  Smokeless tobacco: Never Used  Substance Use Topics  . Alcohol use: Yes    Comment: rarely    Current Outpatient Medications:  .  albuterol (VENTOLIN HFA) 108 (90 Base) MCG/ACT inhaler, Inhale 2 puffs into the lungs every 4 (four) hours as needed for wheezing or shortness of breath., Disp: 4 g, Rfl: 2 .  amLODipine (NORVASC) 10 MG tablet, Take 1 tablet (10 mg total) by mouth daily., Disp: 90 tablet, Rfl: 3 .  amoxicillin-clavulanate (AUGMENTIN)  875-125 MG tablet, Take 1 tablet by mouth every 12 (twelve) hours., Disp: 14 tablet, Rfl: 0 .  ARIPiprazole (ABILIFY) 10 MG tablet, Take 1 tablet (10 mg total) by mouth at bedtime., Disp: 30 tablet, Rfl: 3 .  benzonatate (TESSALON PERLES) 100 MG capsule, Take 1 capsule (100 mg total) by mouth 3 (three) times daily as needed., Disp: 20 capsule, Rfl: 0 .  Blood Glucose Monitoring Suppl (FREESTYLE LITE) DEVI, Use to check blood sugar up to 3 times a day, Disp: 1 each, Rfl: 1 .  cetirizine (ZYRTEC ALLERGY) 10 MG tablet, Take 1 tablet (10 mg total) by mouth daily., Disp: 30 tablet, Rfl: 0 .  divalproex (DEPAKOTE) 250 MG DR tablet, Take 1 tablet (250 mg total) by mouth 2 (two) times daily., Disp: 60 tablet, Rfl: 3 .  doxycycline (VIBRA-TABS) 100 MG tablet, Take 1 tablet (100 mg total) by mouth 2 (two) times daily., Disp: 20 tablet, Rfl: 0 .  empagliflozin (JARDIANCE) 10 MG TABS tablet, Take 1 tablet (10 mg total) by mouth daily., Disp: 90 tablet, Rfl: 3 .  exenatide (BYETTA 10 MCG PEN) 10 MCG/0.04ML SOPN injection, Inject 0.04 mLs (10 mcg total) into the skin 2 (two) times daily with a meal., Disp: 45 pen, Rfl: 3 .  FLUoxetine (PROZAC) 20 MG tablet, Take 1 tablet (20 mg total) by mouth daily., Disp: 90 tablet, Rfl: 3 .  fluticasone (FLONASE) 50 MCG/ACT nasal spray, Place 1-2 sprays into both nostrils daily for 7 days., Disp: 1 g, Rfl: 0 .  gabapentin (NEURONTIN) 300 MG capsule, Take 3 capsules (900 mg total) by mouth 2 (two) times daily., Disp: 540 capsule, Rfl: 3 .  glucose blood (FREESTYLE LITE) test strip, Use to check blood sugar 3 times daily. diag code E11.9. insulin dependent, Disp: 300 each, Rfl: 3 .  guaiFENesin-codeine 100-10 MG/5ML syrup, Take 5 mLs by mouth every 6 (six) hours as needed for cough., Disp: 120 mL, Rfl: 0 .  insulin aspart (NOVOLOG FLEXPEN) 100 UNIT/ML FlexPen, Inject 10 Units into the skin 3 (three) times daily with meals., Disp: 45 mL, Rfl: 3 .  insulin detemir (LEVEMIR FLEXPEN)  100 UNIT/ML FlexPen, Inject 40 Units into the skin daily at 10 pm. IM program, Disp: 135 mL, Rfl: 3 .  Insulin Pen Needle (NOVOFINE PLUS) 32G X 4 MM MISC, 1 Dose by Does not apply route 4 (four) times daily. Use with Levemir and Novolog, IM program, Disp: 390 each, Rfl: 3 .  iron polysaccharides (NIFEREX) 150 MG capsule, Take 1 capsule (150 mg total) by mouth daily., Disp: 30 capsule, Rfl: 3 .  Lancets (FREESTYLE) lancets, Use to check blood sugar 3 times daily. diag code E11.9. insulin dependent, Disp: 300 each, Rfl: 3 .  levothyroxine (SYNTHROID) 150 MCG tablet, Take 1 tablet (150 mcg total) by mouth daily before breakfast., Disp: 90 tablet, Rfl: 3 .  lidocaine (XYLOCAINE) 2 % solution, Use as directed 15 mLs in the mouth or throat every 6 (six) hours as needed for mouth pain.,  Disp: 100 mL, Rfl: 0 .  lisinopril (ZESTRIL) 40 MG tablet, Take 1 tablet (40 mg total) by mouth daily., Disp: 90 tablet, Rfl: 3 .  lovastatin (MEVACOR) 40 MG tablet, Take 1 tablet (40 mg total) by mouth at bedtime., Disp: 90 tablet, Rfl: 3 .  meclizine (ANTIVERT) 25 MG tablet, Take 1 tablet (25 mg total) by mouth 3 (three) times daily as needed for dizziness., Disp: 30 tablet, Rfl: 0 .  metFORMIN (GLUCOPHAGE) 500 MG tablet, Take 2 tablets (1,000 mg total) by mouth 2 (two) times daily with a meal., Disp: 360 tablet, Rfl: 3 .  methylphenidate (RITALIN) 20 MG tablet, Take 1 tablet (20 mg total) by mouth 2 (two) times daily with breakfast and lunch. Take in the morning and at lunch, no later than 3 PM., Disp: 60 tablet, Rfl: 0 .  montelukast (SINGULAIR) 10 MG tablet, Take 1 tablet (10 mg total) by mouth at bedtime., Disp: 90 tablet, Rfl: 3 .  naproxen (NAPROSYN) 500 MG tablet, Take 1 tablet (500 mg total) by mouth 2 (two) times daily with a meal., Disp: 42 tablet, Rfl: 0 .  nystatin (MYCOSTATIN) 100000 UNIT/ML suspension, Take 5 mLs (500,000 Units total) by mouth 4 (four) times daily., Disp: 60 mL, Rfl: 1 .  omeprazole (PRILOSEC)  40 MG capsule, Take 1 capsule (40 mg total) by mouth 2 (two) times daily at 8 am and 10 pm., Disp: 180 capsule, Rfl: 3 .  ondansetron (ZOFRAN-ODT) 4 MG disintegrating tablet, Take 1 tablet (4 mg total) by mouth every 8 (eight) hours as needed for nausea or vomiting., Disp: 15 tablet, Rfl: 0 .  prazosin (MINIPRESS) 1 MG capsule, Take 1 capsule (1 mg total) by mouth at bedtime., Disp: 30 capsule, Rfl: 3 .  predniSONE (STERAPRED UNI-PAK 21 TAB) 10 MG (21) TBPK tablet, Use as directed, Disp: 21 tablet, Rfl: 0 .  sucralfate (CARAFATE) 1 g tablet, Take 1 tablet (1 g total) by mouth 4 (four) times daily -  with meals and at bedtime for 7 days., Disp: 28 tablet, Rfl: 0 .  triamcinolone cream (KENALOG) 0.1 %, Apply 1 application topically 2 (two) times daily., Disp: 30 g, Rfl: 0 .  vitamin E 180 MG (400 UNITS) capsule, Take 1 capsule (400 Units total) by mouth daily., Disp: 30 capsule, Rfl: 0  Allergies  Allergen Reactions  . Aspirin Other (See Comments)    Was told by her mother not to take it in the past but was put on it a few years ago and did fine without any adverse effect  . Tylenol [Acetaminophen]     Does not take due to fatty liver disease    Objective:   There were no vitals taken for this visit.  Patient is well-developed, well-nourished in no acute distress.  Resting comfortably at home.  Head is normocephalic, atraumatic.  Tongue is mildly erythematous with some which patches, there is one visible aphthous ulcer No labored breathing.  Speech is clear and coherent with logical content.  Patient is alert and oriented at baseline.    Assessment and Plan:   1. Oral Thrush   Nystatin rinses.  PCP follow-up due to recurrent nature.   Karen Horseman, PA-C 08/25/2020

## 2020-08-25 NOTE — Patient Instructions (Signed)
I've refilled the Nystatin mouth wash.  Use as directed.  Please discuss this recurrent problem with your doctor at your next visit.

## 2020-09-10 ENCOUNTER — Encounter: Payer: Self-pay | Admitting: Physician Assistant

## 2020-09-10 ENCOUNTER — Encounter: Admitting: Physician Assistant

## 2020-09-10 ENCOUNTER — Telehealth: Admitting: Physician Assistant

## 2020-09-10 DIAGNOSIS — K1379 Other lesions of oral mucosa: Secondary | ICD-10-CM

## 2020-09-10 MED ORDER — NYSTATIN 100000 UNIT/ML MT SUSP: 5.0000 mL | Freq: Three times a day (TID) | OROMUCOSAL | 0 refills | Status: AC | PRN

## 2020-09-10 NOTE — Patient Instructions (Signed)
Instructions sent to patients MyChart.

## 2020-09-10 NOTE — Progress Notes (Signed)
Karen [name], [name] are scheduled for a virtual visit with your provider today.    Just as we do with appointments in the office, we must obtain your consent to participate.  Your consent will be active for this visit and any virtual visit you may have with one of our providers in the next 365 days.    If you have a MyChart account, I can also send a copy of this consent to you electronically.  All virtual visits are billed to your insurance company just like a traditional visit in the office.  As this is a virtual visit, video technology does not allow for your provider to perform a traditional examination.  This may limit your provider's ability to fully assess your condition.  If your provider identifies any concerns that need to be evaluated in person or the need to arrange testing such as labs, EKG, etc, we will make arrangements to do so.    Although advances in technology are sophisticated, we cannot ensure that it will always work on either your end or our end.  If the connection with a video visit is poor, we may have to switch to a telephone visit.  With either a video or telephone visit, we are not always able to ensure that we have a secure connection.   I need to obtain your verbal consent now.   Are you willing to proceed with your visit today?   Karen [name] has provided verbal consent on 09/10/2020 for a virtual visit (video or telephone).   Karen Doffing, PA-C 09/10/2020  5:25 PM   Virtual Visit via Video   I connected with patient on 09/10/20 at  5:30 PM EDT by a video enabled telemedicine application and verified that I am speaking with the correct person using two identifiers.  Location patient: Home Location provider: [facility] Persons participating in the virtual visit: Patient, Provider  I discussed the limitations of evaluation and management by telemedicine and the availability of in person appointments. The patient expressed understanding and agreed  to proceed.  Subjective:   HPI:  Patient presents via Careg[facility] today c/o recurring pain, redness, swelling in her mouth and tongue associated with recurrent ulcerations in the front of her mouth and yeast on her tongue. Notes recently seeing a provider via video visit 2 weeks ago for this issue at which time was diagnosed with mild canker sores and thrush. Was given diflucan and nystatin for treatment and was instructed to follow-up with PCP for any non-resolving or recurring symptoms. Patient endorses taking the Diflucan with some improvement. States the dose she was given of nystatin was so small it only lasted a day. Did not note significant improvement with this. States this keeps recurring and she is not sure what the cause could be. Is wanting a full assessment giving her medical history as well as consideration for STI testing.   ROS:   See pertinent positives and negatives per HPI.  Patient Active Problem List   Diagnosis Date Noted  . COVID-19 06/18/2020  . Gastroparesis 03/30/2020  . Glossitis 03/30/2020  . Sensorineural hearing loss (SNHL), bilateral 10/24/2019  . Onychomycosis 10/01/2019  . Pain due to dental caries 10/01/2019  . Hearing loss 08/27/2019  . Boil of buttock 06/04/2019  . Healthcare maintenance 06/04/2019  . 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 Dizziness 08/14/2018  . Iron deficiency anemia 08/14/2018  . Morbid obesity (HCC) 05/15/2018  . 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 (HFpEF) heart failure  with preserved ejection fraction (HCC) 11/14/2017  . Obstructive sleep apnea 09/26/2017  . Hypothyroidism 04/17/2017  . Diabetes mellitus (HCC) 04/17/2017  . Hypertension associated with diabetes (HCC) 04/17/2017  . COPD (chronic obstructive pulmonary disease) (HCC) 04/17/2017  . GERD (gastroesophageal reflux disease) 04/17/2017  . 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 Fibromyalgia syndrome 04/17/2017    Social History   Tobacco Use  . Smoking status:  Former Smoker    Packs/day: 0.30    Types: Cigarettes    Quit date: 10/29/2017    Years since quitting: 2.8  . Smokeless tobacco: Never Used  Substance Use Topics  . Alcohol use: Yes    Comment: rarely    Current Outpatient Medications:  .  albuterol (VENTOLIN HFA) 108 (90 Base) MCG/ACT inhaler, Inhale 2 puffs into the lungs every 4 (four) hours as needed for wheezing or shortness of breath., Disp: 4 g, Rfl: 2 .  amLODipine (NORVASC) 10 MG tablet, Take 1 tablet (10 mg total) by mouth daily., Disp: 90 tablet, Rfl: 3 .  amoxicillin-clavulanate (AUGMENTIN) 875-125 MG tablet, Take 1 tablet by mouth every 12 (twelve) hours., Disp: 14 tablet, Rfl: 0 .  ARIPiprazole (ABILIFY) 10 MG tablet, Take 1 tablet (10 mg total) by mouth at bedtime., Disp: 30 tablet, Rfl: 3 .  benzonatate (TESSALON PERLES) 100 MG capsule, Take 1 capsule (100 mg total) by mouth 3 (three) times daily as needed., Disp: 20 capsule, Rfl: 0 .  Blood Glucose Monitoring Suppl (FREESTYLE LITE) DEVI, Use to check blood sugar up to 3 times a day, Disp: 1 each, Rfl: 1 .  cetirizine (ZYRTEC ALLERGY) 10 MG tablet, Take 1 tablet (10 mg total) by mouth daily., Disp: 30 tablet, Rfl: 0 .  divalproex (DEPAKOTE) 250 MG DR tablet, Take 1 tablet (250 mg total) by mouth 2 (two) times daily., Disp: 60 tablet, Rfl: 3 .  doxycycline (VIBRA-TABS) 100 MG tablet, Take 1 tablet (100 mg total) by mouth 2 (two) times daily., Disp: 20 tablet, Rfl: 0 .  empagliflozin (JARDIANCE) 10 MG TABS tablet, Take 1 tablet (10 mg total) by mouth daily., Disp: 90 tablet, Rfl: 3 .  exenatide (BYETTA 10 MCG PEN) 10 MCG/0.04ML SOPN injection, Inject 0.04 mLs (10 mcg total) into the skin 2 (two) times daily with a meal., Disp: 45 pen, Rfl: 3 .  FLUoxetine (PROZAC) 20 MG tablet, Take 1 tablet (20 mg total) by mouth daily., Disp: 90 tablet, Rfl: 3 .  fluticasone (FLONASE) 50 MCG/ACT nasal spray, Place 1-2 sprays into both nostrils daily for 7 days., Disp: 1 g, Rfl: 0 .  gabapentin  (NEURONTIN) 300 MG capsule, Take 3 capsules (900 mg total) by mouth 2 (two) times daily., Disp: 540 capsule, Rfl: 3 .  glucose blood (FREESTYLE LITE) test strip, Use to check blood sugar 3 times daily. diag code E11.9. insulin dependent, Disp: 300 each, Rfl: 3 .  guaiFENesin-codeine 100-10 MG/5ML syrup, Take 5 mLs by mouth every 6 (six) hours as needed for cough., Disp: 120 mL, Rfl: 0 .  insulin aspart (NOVOLOG FLEXPEN) 100 UNIT/ML FlexPen, Inject 10 Units into the skin 3 (three) times daily with meals., Disp: 45 mL, Rfl: 3 .  insulin detemir (LEVEMIR FLEXPEN) 100 UNIT/ML FlexPen, Inject 40 Units into the skin daily at 10 pm. IM program, Disp: 135 mL, Rfl: 3 .  Insulin Pen Needle (NOVOFINE PLUS) 32G X 4 MM MISC, 1 Dose by Does not apply route 4 (four) times daily. Use with Levemir and Novolog, IM program, Disp: 390  each, Rfl: 3 .  iron polysaccharides (NIFEREX) 150 MG capsule, Take 1 capsule (150 mg total) by mouth daily., Disp: 30 capsule, Rfl: 3 .  Lancets (FREESTYLE) lancets, Use to check blood sugar 3 times daily. diag code E11.9. insulin dependent, Disp: 300 each, Rfl: 3 .  levothyroxine (SYNTHROID) 150 MCG tablet, Take 1 tablet (150 mcg total) by mouth daily before breakfast., Disp: 90 tablet, Rfl: 3 .  lidocaine (XYLOCAINE) 2 % solution, Use as directed 15 mLs in the mouth or throat every 6 (six) hours as needed for mouth pain., Disp: 100 mL, Rfl: 0 .  lisinopril (ZESTRIL) 40 MG tablet, Take 1 tablet (40 mg total) by mouth daily., Disp: 90 tablet, Rfl: 3 .  lovastatin (MEVACOR) 40 MG tablet, Take 1 tablet (40 mg total) by mouth at bedtime., Disp: 90 tablet, Rfl: 3 .  meclizine (ANTIVERT) 25 MG tablet, Take 1 tablet (25 mg total) by mouth 3 (three) times daily as needed for dizziness., Disp: 30 tablet, Rfl: 0 .  metFORMIN (GLUCOPHAGE) 500 MG tablet, Take 2 tablets (1,000 mg total) by mouth 2 (two) times daily with a meal., Disp: 360 tablet, Rfl: 3 .  methylphenidate (RITALIN) 20 MG tablet, Take 1  tablet (20 mg total) by mouth 2 (two) times daily with breakfast and lunch. Take in the morning and at lunch, no later than 3 PM., Disp: 60 tablet, Rfl: 0 .  montelukast (SINGULAIR) 10 MG tablet, Take 1 tablet (10 mg total) by mouth at bedtime., Disp: 90 tablet, Rfl: 3 .  naproxen (NAPROSYN) 500 MG tablet, Take 1 tablet (500 mg total) by mouth 2 (two) times daily with a meal., Disp: 42 tablet, Rfl: 0 .  nystatin (MYCOSTATIN) 100000 UNIT/ML suspension, Take 5 mLs (500,000 Units total) by mouth 4 (four) times daily., Disp: 60 mL, Rfl: 1 .  omeprazole (PRILOSEC) 40 MG capsule, Take 1 capsule (40 mg total) by mouth 2 (two) times daily at 8 am and 10 pm., Disp: 180 capsule, Rfl: 3 .  ondansetron (ZOFRAN-ODT) 4 MG disintegrating tablet, Take 1 tablet (4 mg total) by mouth every 8 (eight) hours as needed for nausea or vomiting., Disp: 15 tablet, Rfl: 0 .  prazosin (MINIPRESS) 1 MG capsule, Take 1 capsule (1 mg total) by mouth at bedtime., Disp: 30 capsule, Rfl: 3 .  predniSONE (STERAPRED UNI-PAK 21 TAB) 10 MG (21) TBPK tablet, Use as directed, Disp: 21 tablet, Rfl: 0 .  sucralfate (CARAFATE) 1 g tablet, Take 1 tablet (1 g total) by mouth 4 (four) times daily -  with meals and at bedtime for 7 days., Disp: 28 tablet, Rfl: 0 .  triamcinolone cream (KENALOG) 0.1 %, Apply 1 application topically 2 (two) times daily., Disp: 30 g, Rfl: 0 .  vitamin E 180 MG (400 UNITS) capsule, Take 1 capsule (400 Units total) by mouth daily., Disp: 30 capsule, Rfl: 0  Allergies  Allergen Reactions  . Aspirin Other (See Comments)    Was told by her mother not to take it in the past but was put on it a few years ago and did fine without any adverse effect  . Tylenol [Acetaminophen]     Does not take due to fatty liver disease    Objective:   There were no vitals taken for this visit.  Patient is well-developed, well-nourished in no acute distress.  Resting comfortably at home.  Head is normocephalic, atraumatic.  No  labored breathing.  Speech is clear and coherent with logical content.  Patient is alert and oriented at baseline.  Unable to visualize her mouth fully due to poor connectivity. Tongue is slightly inflamed with noted small ahpthous ulcers of lower front gum line. No evidence of thrush in the parts of mouth I can visualize.    Assessment and Plan:   1. Mouth sores 2. Mouth pain Recurring issue for patient. She needs further evaluation. She notes she does not currently have a PCP she is followed by. Discussed with her the need to be evaluated by her dentist or be seen at a local Urgent Care for in-depth examination and possible lab workup. She can also get a full STI screen at that time. Local options for evaluation reviewed with patient. Handout sent to MyChart. She is to not delay care. Will give her a small Rx for magic mouthwash to start using to help ease discomfort until she can go be seen for evaluation tomorrow.     Piedad ClimesWilliam Cody Gadea, PA-C 09/10/2020

## 2020-09-10 NOTE — Progress Notes (Signed)
Erroneous encounter. Patient submitted video on demand visit for current complaint.

## 2020-10-06 ENCOUNTER — Telehealth: Payer: Self-pay | Admitting: *Deleted

## 2020-10-06 NOTE — Chronic Care Management (AMB) (Signed)
  Care Management   Outreach Note  10/06/2020 Name: Shahida Schnackenberg MRN: [id number] DOB: Dec 24, 1975  Reason  : Advice Only   Patient requesting to re-referred to Chronic Care Management services. Explained to patient to call PCP, Verdene Lennert, MD,  office and ask to re referred to Chronic Care Management Services for BSW.     Gwenevere Ghazi  Care Guide, Embedded Care Coordination [facility] Management

## 2020-10-06 NOTE — Chronic Care Management (AMB) (Signed)
  Care Management   Note  10/06/2020 Name: Veera Stapleton MRN: [id number] DOB: 02/21/1976  Shatora Weatherbee is a 45 y.o. year old female who is a primary care patient of Verdene Lennert, MD. I reached out to Valera Castle by phone today in response to a referral sent by Ms. Dory Horn PCP, Verdene Lennert, MD .    Ms. Delpriore was given information about care management services today including:  1. Care management services include personalized support from designated clinical staff supervised by her physician, including individualized plan of care and coordination with other care providers 2. 24/7 contact phone numbers for assistance for urgent and routine care needs. 3. The patient may stop care management services at any time by phone call to the office staff.  Patient agreed to services and verbal consent obtained.   Follow up plan: Telephone appointment with care management team member scheduled for:10/08/2020  [facility] Guide, Embedded Care Coordination [facility] Management

## 2020-10-08 ENCOUNTER — Ambulatory Visit: Payer: Self-pay | Admitting: *Deleted

## 2020-10-08 ENCOUNTER — Other Ambulatory Visit: Payer: Self-pay

## 2020-10-08 ENCOUNTER — Ambulatory Visit: Admitting: Licensed Clinical Social Worker

## 2020-10-08 DIAGNOSIS — I152 Hypertension secondary to endocrine disorders: Secondary | ICD-10-CM

## 2020-10-08 DIAGNOSIS — I5032 Chronic diastolic (congestive) heart failure: Secondary | ICD-10-CM

## 2020-10-08 DIAGNOSIS — Z794 Long term (current) use of insulin: Secondary | ICD-10-CM

## 2020-10-08 DIAGNOSIS — J449 Chronic obstructive pulmonary disease, unspecified: Secondary | ICD-10-CM

## 2020-10-08 DIAGNOSIS — G4733 Obstructive sleep apnea (adult) (pediatric): Secondary | ICD-10-CM

## 2020-10-08 DIAGNOSIS — E1159 Type 2 diabetes mellitus with other circulatory complications: Secondary | ICD-10-CM

## 2020-10-08 DIAGNOSIS — E1142 Type 2 diabetes mellitus with diabetic polyneuropathy: Secondary | ICD-10-CM

## 2020-10-08 NOTE — Patient Instructions (Signed)
Visit Information  Instructions: patient will work with SW to address concerns related to counseling.  Patient was given the following information about care management and care coordination services today, agreed to services, and gave verbal consent: 1.care management/care coordination services include personalized support from designated clinical staff supervised by their physician, including individualized plan of care and coordination with other care providers 2. 24/7 contact phone numbers for assistance for urgent and routine care needs. 3. The patient may stop care management/care coordination services at any time by phone call to the office staff.  Patient verbalizes understanding of instructions provided today and agrees to view in MyChart.   Follow up with provider re: Counseling and medications  Christen Butter, BSW  Social Worker [facility] Care Management  [phone]
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
## 2020-10-08 NOTE — Chronic Care Management (AMB) (Signed)
Care Management    RN Visit Note  10/08/2020 Name: Karen [name] MRN: [id number] DOB: 07/11/1975  Subjective: Karen [name] is a 45 y.o. year old female who is a primary care patient of Karen Lennert, MD. The care management team was consulted for assistance with disease management and care coordination needs.    Engaged with patient by telephone for initial visit in response to provider referral for case management and/or care coordination services.   Consent to Services:   Karen [name] was given information about Care Management services today including:  1. Care Management services includes personalized support from designated clinical staff supervised by her physician, including individualized plan of care and coordination with other care providers 2. 24/7 contact phone numbers for assistance for urgent and routine care needs. 3. The patient may stop case management services at any time by phone call to the office staff.  Patient agreed to services and consent obtained.   Assessment: Review of patient past medical history, allergies, medications, health status, including review of consultants reports, laboratory and other test data, was performed as part of comprehensive evaluation and provision of chronic care management services.   SDOH (Social Determinants of Health) assessments and interventions performed:  see social work note  Care Plan  Allergies  Allergen Reactions  . Aspirin Other (See Comments)    Was told by her mother not to take it in the past but was put on it a few years ago and did fine without any adverse effect  . Tylenol [Acetaminophen]     Does not take due to fatty liver disease    Outpatient Encounter Medications as of 10/08/2020  Medication Sig Note  . albuterol (VENTOLIN HFA) 108 (90 Base) MCG/ACT inhaler Inhale 2 puffs into the lungs every 4 (four) hours as needed for wheezing or shortness of breath. 10/30/2019: Sometimes 4-5 times week depending on the  trigger such as crying, strenuous activity  . amLODipine (NORVASC) 10 MG tablet Take 1 tablet (10 mg total) by mouth daily.   . ARIPiprazole (ABILIFY) 10 MG tablet Take 1 tablet (10 mg total) by mouth at bedtime.   . Blood Glucose Monitoring Suppl (FREESTYLE LITE) DEVI Use to check blood sugar up to 3 times a day   . cetirizine (ZYRTEC ALLERGY) 10 MG tablet Take 1 tablet (10 mg total) by mouth daily.   . divalproex (DEPAKOTE) 250 MG DR tablet Take 1 tablet (250 mg total) by mouth 2 (two) times daily.   . empagliflozin (JARDIANCE) 10 MG TABS tablet Take 1 tablet (10 mg total) by mouth daily.   Marland Kitchen exenatide (BYETTA 10 MCG PEN) 10 MCG/0.04ML SOPN injection Inject 0.04 mLs (10 mcg total) into the skin 2 (two) times daily with a meal.   . FLUoxetine (PROZAC) 20 MG tablet Take 1 tablet (20 mg total) by mouth daily.   . fluticasone (FLONASE) 50 MCG/ACT nasal spray Place 1-2 sprays into both nostrils daily for 7 days.   Marland Kitchen gabapentin (NEURONTIN) 300 MG capsule Take 3 capsules (900 mg total) by mouth 2 (two) times daily.   Marland Kitchen glucose blood (FREESTYLE LITE) test strip Use to check blood sugar 3 times daily. diag code E11.9. insulin dependent   . guaiFENesin-codeine 100-10 MG/5ML syrup Take 5 mLs by mouth every 6 (six) hours as needed for cough.   . insulin aspart (NOVOLOG FLEXPEN) 100 UNIT/ML FlexPen Inject 10 Units into the skin 3 (three) times daily with meals.   . insulin detemir (LEVEMIR FLEXPEN) 100 UNIT/ML  FlexPen Inject 40 Units into the skin daily at 10 pm. IM program   . Insulin Pen Needle (NOVOFINE PLUS) 32G X 4 MM MISC 1 Dose by Does not apply route 4 (four) times daily. Use with Levemir and Novolog, IM program   . iron polysaccharides (NIFEREX) 150 MG capsule Take 1 capsule (150 mg total) by mouth daily.   . Lancets (FREESTYLE) lancets Use to check blood sugar 3 times daily. diag code E11.9. insulin dependent   . levothyroxine (SYNTHROID) 150 MCG tablet Take 1 tablet (150 mcg total) by mouth daily  before breakfast.   . lisinopril (ZESTRIL) 40 MG tablet Take 1 tablet (40 mg total) by mouth daily.   Marland Kitchen lovastatin (MEVACOR) 40 MG tablet Take 1 tablet (40 mg total) by mouth at bedtime.   . magic mouthwash (nystatin, lidocaine, diphenhydrAMINE, alum & mag hydroxide) suspension Swish and spit 5 mLs 3 (three) times daily as needed for mouth pain.   . meclizine (ANTIVERT) 25 MG tablet Take 1 tablet (25 mg total) by mouth 3 (three) times daily as needed for dizziness.   . metFORMIN (GLUCOPHAGE) 500 MG tablet Take 2 tablets (1,000 mg total) by mouth 2 (two) times daily with a meal.   . methylphenidate (RITALIN) 20 MG tablet Take 1 tablet (20 mg total) by mouth 2 (two) times daily with breakfast and lunch. Take in the morning and at lunch, no later than 3 PM.   . montelukast (SINGULAIR) 10 MG tablet Take 1 tablet (10 mg total) by mouth at bedtime.   . naproxen (NAPROSYN) 500 MG tablet Take 1 tablet (500 mg total) by mouth 2 (two) times daily with a meal.   . omeprazole (PRILOSEC) 40 MG capsule Take 1 capsule (40 mg total) by mouth 2 (two) times daily at 8 am and 10 pm.   . ondansetron (ZOFRAN-ODT) 4 MG disintegrating tablet Take 1 tablet (4 mg total) by mouth every 8 (eight) hours as needed for nausea or vomiting.   . prazosin (MINIPRESS) 1 MG capsule Take 1 capsule (1 mg total) by mouth at bedtime.   . sucralfate (CARAFATE) 1 g tablet Take 1 tablet (1 g total) by mouth 4 (four) times daily -  with meals and at bedtime for 7 days.   Marland Kitchen triamcinolone cream (KENALOG) 0.1 % Apply 1 application topically 2 (two) times daily.   . vitamin E 180 MG (400 UNITS) capsule Take 1 capsule (400 Units total) by mouth daily.    No facility-administered encounter medications on file as of 10/08/2020.    Patient Active Problem List   Diagnosis Date Noted  . COVID-19 06/18/2020  . Gastroparesis 03/30/2020  . Glossitis 03/30/2020  . Sensorineural hearing loss (SNHL), bilateral 10/24/2019  . Onychomycosis 10/01/2019   . Pain due to dental caries 10/01/2019  . Hearing loss 08/27/2019  . Boil of buttock 06/04/2019  . Healthcare maintenance 06/04/2019  . 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 Dizziness 08/14/2018  . Iron deficiency anemia 08/14/2018  . Morbid obesity (HCC) 05/15/2018  . 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 (HFpEF) heart failure with preserved ejection fraction (HCC) 11/14/2017  . Obstructive sleep apnea 09/26/2017  . Hypothyroidism 04/17/2017  . Diabetes mellitus (HCC) 04/17/2017  . Hypertension associated with diabetes (HCC) 04/17/2017  . COPD (chronic obstructive pulmonary disease) (HCC) 04/17/2017  . GERD (gastroesophageal reflux disease) 04/17/2017  . 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 Fibromyalgia syndrome 04/17/2017    Conditions to be addressed/monitored: CHF, HTN, COPD, DMII and OSA and Mental  Health Concerns  Care Plan : Case Initiation/Collaboration in pt with multiple comorbid conditions including DM, HTN, OSA, HF, COPD  Updates made by Nunzio Cory, RN since 10/08/2020 12:00 AM    Problem: Chronic Disease Management and Mental Health Support Needs   Priority: High    Long-Range Goal: Patient-Specific Goal   Start Date: 10/08/2020  Expected End Date: 01/06/2021  This Visit's Progress: On track  Priority: Medium  Note:   Current Barriers:  . Chronic Disease Management support, education, and care coordination needs related to CHF, HTN, COPD, DM, and OSA . Unable to independently manage multiple chronic medical and mental health conditions Case Manager Clinical Goal(s):  . patient will work with BSW to address needs related to Mental Health Concerns  and chronic disease management needs  in patient with CHF, HTN, COPD, DM, and OSA Interventions:  . Collaborated with BSW to initiate plan of care to address needs related to Mental Health Concerns  in patient with CHF, HTN, COPD, DM, and OSA . Collaboration with Karen Lennert, MD regarding development and  update of comprehensive plan of care as evidenced by provider attestation and co-signature . Inter-disciplinary care team collaboration (see longitudinal plan of care) Patient Goals/Self-Care Activities .  Patient will work with BSW to address care coordination needs and will continue to work with the clinical team to address health care and disease management related needs.   Follow Up Plan: The patient has been provided with contact information for the care management team and has been advised to call with any health related questions or concerns.  The care management team will reach out to the patient again over the next 30 days.       Plan: The care management team will reach out to the patient again over the next 30 days.  Marja Kays MHA,BSN,RN,CCM [facility]  [facility] Care Management Coordinator Direct Dial:  [phone]  Fax: [phone]

## 2020-10-15 ENCOUNTER — Ambulatory Visit (INDEPENDENT_AMBULATORY_CARE_PROVIDER_SITE_OTHER): Admitting: Internal Medicine

## 2020-10-15 ENCOUNTER — Telehealth: Payer: Self-pay | Admitting: *Deleted

## 2020-10-15 ENCOUNTER — Ambulatory Visit: Admitting: Behavioral Health

## 2020-10-15 ENCOUNTER — Other Ambulatory Visit: Payer: Self-pay

## 2020-10-15 DIAGNOSIS — F331 Major depressive disorder, recurrent, moderate: Secondary | ICD-10-CM

## 2020-10-15 DIAGNOSIS — T7491XA Unspecified adult maltreatment, confirmed, initial encounter: Secondary | ICD-10-CM | POA: Diagnosis not present

## 2020-10-15 DIAGNOSIS — Z658 Other specified problems related to psychosocial circumstances: Secondary | ICD-10-CM

## 2020-10-15 DIAGNOSIS — F419 Anxiety disorder, unspecified: Secondary | ICD-10-CM

## 2020-10-15 DIAGNOSIS — F314 Bipolar disorder, current episode depressed, severe, without psychotic features: Secondary | ICD-10-CM

## 2020-10-15 DIAGNOSIS — F3132 Bipolar disorder, current episode depressed, moderate: Secondary | ICD-10-CM
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
## 2020-10-15 NOTE — Telephone Encounter (Signed)
Thank you Lauren. Will discuss with Dr. Monna Fam and follow up with patient this afternoon

## 2020-10-16 ENCOUNTER — Encounter: Payer: Self-pay | Admitting: Physician Assistant

## 2020-10-16 ENCOUNTER — Telehealth: Admitting: Physician Assistant

## 2020-10-16 DIAGNOSIS — B370 Candidal stomatitis: Secondary | ICD-10-CM

## 2020-10-16 MED ORDER — NYSTATIN 100000 UNIT/ML MT SUSP: 5.0000 mL | Freq: Four times a day (QID) | OROMUCOSAL | 0 refills | Status: DC

## 2020-10-16 MED ORDER — NYSTATIN 100000 UNIT/ML MT SUSP: 5.0000 mL | Freq: Four times a day (QID) | OROMUCOSAL | 0 refills | Status: AC

## 2020-10-16 MED ORDER — FLUCONAZOLE 150 MG PO TABS: 150.0000 mg | ORAL_TABLET | Freq: Once | ORAL | 0 refills | Status: AC

## 2020-10-16 NOTE — Patient Instructions (Signed)
Oral Thrush, Adult Oral thrush is an infection in your mouth and throat and on your tongue. It causes white patches to form in your mouth and on your tongue. Many cases of thrush are mild. But, sometimes, thrush can be serious. People who have a weak body defense system (immune system) or other diseases can be affected more. What are the causes? This condition is caused by a type of fungus called yeast. The fungus is normally present in small amounts in the mouth and nose. If a person has a long-term illness or a weak body defense system, the fungus can grow and spread quickly. This causes thrush. What increases the risk? You are more likely to develop this condition if:  You have a weak body defense system.  You are an older adult.  You have diabetes, cancer, or HIV.  You have a dry mouth.  You are pregnant or breastfeeding.  You do not take good care of your teeth. This risk is greater for people who have false teeth (dentures).  You use antibiotic or steroid medicines. What are the signs or symptoms? Symptoms of this condition include:  A burning feeling in the mouth and throat.  White patches that stick to the mouth and tongue.  A bad taste in the mouth or trouble tasting foods.  A feeling like you have cotton in your mouth.  Pain when you eat and swallow.  Not wanting to eat as much as usual.  Cracking at the corners of the mouth.   How is this treated? This condition is treated with medicines called antifungals. These medicines prevent a fungus from growing. The medicines are either put right on the area (topical) or swallowed (oral). Your doctor will also treat other problems that you may have, such as diabetes or HIV. Follow these instructions at home: Medicines  Take or use over-the-counter and prescription medicines only as told by your doctor.  Ask your doctor about an over-the-counter medicine called gentian violet. Helping with pain and soreness To lessen  your pain:  Drink cold liquids, like water and iced tea.  Eat frozen ice pops or frozen juices.  Eat foods that are easy to swallow, like gelatin and ice cream.  Drink from a straw if you have too much pain in your mouth.   General instructions  Eat plain yogurt that has live cultures in it. Read the label to make sure that there are live cultures in your yogurt.  If you wear false teeth: ? Take them out before you go to bed. ? Brush them well. ? Soak them in a cleaner.  Rinse your mouth with warm salt-water many times a day. To make the salt-water mixture, dissolve -1 teaspoon (3-6 g) of salt in 1 cup (237 mL) of warm water. Contact a doctor if:  Your problems do not get better within 7 days of treatment.  Your infection is spreading. This may show as white areas on the skin outside of your mouth.  You are nursing your baby and you have redness and pain in the nipples. Summary  Oral thrush is an infection in your mouth and throat. It is caused by a fungus.  You are more likely to get this condition if you have a weak body defense system. Diseases like diabetes, cancer, or HIV also add to your risk.  This condition is treated with medicines called antifungals.  Contact a doctor if you do not get better within 7 days of starting treatment. This information  is not intended to replace advice given to you by your health care provider. Make sure you discuss any questions you have with your health care provider. Document Revised: 03/22/2019 Document Reviewed: 03/22/2019 Elsevier Patient Education  2021 Elsevier Inc.   Diabetes Mellitus Basics  Diabetes mellitus, or diabetes, is a long-term (chronic) disease. It occurs when the body does not properly use sugar (glucose) that is released from food after you eat. Diabetes mellitus may be caused by one or both of these problems:  Your pancreas does not make enough of a hormone called insulin.  Your body does not react in a  normal way to the insulin that it makes. Insulin lets glucose enter cells in your body. This gives you energy. If you have diabetes, glucose cannot get into cells. This causes high blood glucose (hyperglycemia). How to treat and manage diabetes You may need to take insulin or other diabetes medicines daily to keep your glucose in balance. If you are prescribed insulin, you will learn how to give yourself insulin by injection. You may need to adjust the amount of insulin you take based on the foods that you eat. You will need to check your blood glucose levels using a glucose monitor as told by your health care provider. The readings can help determine if you have low or high blood glucose. Generally, you should have these blood glucose levels:  Before meals (preprandial): 80-130 mg/dL (2.5-8.5 mmol/L).  After meals (postprandial): below 180 mg/dL (10 mmol/L).  Hemoglobin A1c (HbA1c) level: less than 7%. Your health care provider will set treatment goals for you. Keep all follow-up visits. This is important. Follow these instructions at home: Diabetes medicines Take your diabetes medicines every day as told by your health care provider. List your diabetes medicines here:  Name of medicine: ______________________________ ? Amount (dose): _______________ Time (a.m./p.m.): _______________ Notes: ___________________________________  Name of medicine: ______________________________ ? Amount (dose): _______________ Time (a.m./p.m.): _______________ Notes: ___________________________________  Name of medicine: ______________________________ ? Amount (dose): _______________ Time (a.m./p.m.): _______________ Notes: ___________________________________ Insulin If you use insulin, list the types of insulin you use here:  Insulin type: ______________________________ ? Amount (dose): _______________ Time (a.m./p.m.): _______________Notes: ___________________________________  Insulin type:  ______________________________ ? Amount (dose): _______________ Time (a.m./p.m.): _______________ Notes: ___________________________________  Insulin type: ______________________________ ? Amount (dose): _______________ Time (a.m./p.m.): _______________ Notes: ___________________________________  Insulin type: ______________________________ ? Amount (dose): _______________ Time (a.m./p.m.): _______________ Notes: ___________________________________  Insulin type: ______________________________ ? Amount (dose): _______________ Time (a.m./p.m.): _______________ Notes: ___________________________________ Managing blood glucose Check your blood glucose levels using a glucose monitor as told by your health care provider. Write down the times that you check your glucose levels here:  Time: _______________ Notes: ___________________________________  Time: _______________ Notes: ___________________________________  Time: _______________ Notes: ___________________________________  Time: _______________ Notes: ___________________________________  Time: _______________ Notes: ___________________________________  Time: _______________ Notes: ___________________________________   Low blood glucose Low blood glucose (hypoglycemia) is when glucose is at or below 70 mg/dL (3.9 mmol/L). Symptoms may include:  Feeling: ? Hungry. ? Sweaty and clammy. ? Irritable or easily upset. ? Dizzy. ? Sleepy.  Having: ? A fast heartbeat. ? A headache. ? A change in your vision. ? Numbness around the mouth, lips, or tongue.  Having trouble with: ? Moving (coordination). ? Sleeping. Treating low blood glucose To treat low blood glucose, eat or drink something containing sugar right away. If you can think clearly and swallow safely, follow the 15:15 rule:  Take 15 grams of a fast-acting carb (carbohydrate), as told by your  health care provider.  Some fast-acting carbs are: ? Glucose tablets: take  3-4 tablets. ? Hard candy: eat 3-5 pieces. ? Fruit juice: drink 4 oz (120 mL). ? Regular (not diet) soda: drink 4-6 oz (120-180 mL). ? Honey or sugar: eat 1 Tbsp (15 mL).  Check your blood glucose levels 15 minutes after you take the carb.  If your glucose is still at or below 70 mg/dL (3.9 mmol/L), take 15 grams of a carb again.  If your glucose does not go above 70 mg/dL (3.9 mmol/L) after 3 tries, get help right away.  After your glucose goes back to normal, eat a meal or a snack within 1 hour. Treating very low blood glucose If your glucose is at or below 54 mg/dL (3 mmol/L), you have very low blood glucose (severe hypoglycemia). This is an emergency. Do not wait to see if the symptoms will go away. Get medical help right away. Call your local emergency services (911 in the [place] Do not drive yourself to the hospital. Questions to ask your health care provider  Should I talk with a diabetes educator?  What equipment will I need to care for myself at home?  What diabetes medicines do I need? When should I take them?  How often do I need to check my blood glucose levels?  What number can I call if I have questions?  When is my follow-up visit?  Where can I find a support group for people with diabetes? Where to find more information  American Diabetes Association: www.diabetes.org  Association of Diabetes Care and Education Specialists: www.diabeteseducator.org Contact a health care provider if:  Your blood glucose is at or above 240 mg/dL (63.0 mmol/L) for 2 days in a row.  You have been sick or have had a fever for 2 days or more, and you are not getting better.  You have any of these problems for more than 6 hours: ? You cannot eat or drink. ? You feel nauseous. ? You vomit. ? You have diarrhea. Get help right away if:  Your blood glucose is lower than 54 mg/dL (3 mmol/L).  You get confused.  You have trouble thinking clearly.  You have trouble  breathing. These symptoms may represent a serious problem that is an emergency. Do not wait to see if the symptoms will go away. Get medical help right away. Call your local emergency services (911 in the [place] Do not drive yourself to the hospital. Summary  Diabetes mellitus is a chronic disease that occurs when the body does not properly use sugar (glucose) that is released from food after you eat.  Take insulin and diabetes medicines as told.  Check your blood glucose every day, as often as told.  Keep all follow-up visits. This is important. This information is not intended to replace advice given to you by your health care provider. Make sure you discuss any questions you have with your health care provider. Document Revised: 09/17/2019 Document Reviewed: 09/17/2019 Elsevier Patient Education  2021 [employer].
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
## 2020-10-19 NOTE — Progress Notes (Signed)
Internal Medicine Clinic Attending  Case discussed with Dr. Basaraba  At the time of the visit.  We reviewed the resident's history and exam and pertinent patient test results.  I agree with the assessment, diagnosis, and plan of care documented in the resident's note.  

## 2020-10-21 ENCOUNTER — Telehealth: Admitting: Physician Assistant

## 2020-10-21 ENCOUNTER — Encounter: Payer: Self-pay | Admitting: Physician Assistant

## 2020-10-21 DIAGNOSIS — J3489 Other specified disorders of nose and nasal sinuses: Secondary | ICD-10-CM | POA: Diagnosis not present

## 2020-10-21 MED ORDER — MUPIROCIN CALCIUM 2 % EX CREA: 1.0000 "application " | TOPICAL_CREAM | Freq: Three times a day (TID) | CUTANEOUS | 0 refills | Status: AC
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
## 2020-10-21 NOTE — Patient Instructions (Signed)
Instructions sent to patients MyChart.

## 2020-11-10 ENCOUNTER — Ambulatory Visit: Admitting: Licensed Clinical Social Worker

## 2020-11-10 NOTE — Chronic Care Management (AMB) (Signed)
  Care Management   Social Work Visit Note  11/10/2020 Name: Karen [name] MRN: [id number] DOB: 30-May-1976  Karen [name] is a 45 y.o. year old female who sees Verdene Lennert, MD for primary care. The care management team was consulted for assistance with care management and care coordination needs related to Mental Health Counseling and Resources    Assessment: Review of patient history, allergies, and health status during evaluation of patient need for care management/care coordination services.    Interventions:  SW made an unsuccessful phone call to patient. Patient phone unable to receive messages.  SW review chart and confirmed patient is receiving counseling.    Plan:  SW will follow up with patient in the next 30 days.   Christen Butter, BSW  Social Worker [facility] Care Management  [phone]

## 2020-12-01 ENCOUNTER — Encounter: Payer: Self-pay | Admitting: *Deleted

## 2020-12-02 ENCOUNTER — Telehealth: Payer: Self-pay | Admitting: *Deleted

## 2020-12-02 NOTE — Chronic Care Management (AMB) (Signed)
  Care Management   Note  12/02/2020 Name: Avarie Tavano MRN: [id number] DOB: 28-Nov-1975  Annaleese Guier is a 45 y.o. year old female who is a primary care patient of Verdene Lennert, MD and is actively engaged with the care management team. I reached out to Valera Castle by phone today to assist with scheduling an initial visit with the RN Case Manager  Follow up plan: Unsuccessful telephone outreach attempt made. The care management team will reach out to the patient again over the next 7 days. If patient returns call to provider office, please advise to call Embedded Care Management Care Guide Gwenevere Ghazi at [phone].  Gwenevere Ghazi  Care Guide, Embedded Care Coordination [facility] Management

## 2020-12-09 NOTE — Chronic Care Management (AMB) (Signed)
  Care Management   Outreach Note  12/09/2020 Name: Karen [name] MRN: [id number] DOB: 04/10/1976  Referred by: Verdene Lennert, MD Reason for referral : Care Coordination (Outreach to schedule initial call with RNCM (Deb))   A second unsuccessful telephone outreach was attempted today. The patient was referred to the case management team for assistance with care management and care coordination.   Follow Up Plan: The care management team will reach out to the patient again over the next 7 days.  If patient returns call to provider office, please advise to call Embedded Care Management Care Guide Gwenevere Ghazi at [phone].  Gwenevere Ghazi  Care Guide, Embedded Care Coordination [facility] Management

## 2020-12-15 NOTE — Chronic Care Management (AMB) (Signed)
  Care Management   Note  12/15/2020 Name: [name] MRN: [id number] DOB: 1976-05-16  Karen [name] is a 45 y.o. year old female who is a primary care patient of Verdene Lennert, MD and is actively engaged with the care management team. I reached out to Valera Castle by phone today to assist with scheduling an initial visit with the RN Case Manager  Follow up plan: Third unsuccessful telephone outreach attempt made. Unable to make contact on outreach attempts x  3. PCP Verdene Lennert, MD notified via routed documentation in medical record.  We have been unable to make contact with the patient for follow up. The care management team is available to follow up with the patient after provider conversation with the patient regarding recommendation for care management engagement and subsequent re-referral to the care management team. If patient returns call to provider office, please advise to call Embedded Care Management Care Guide Gwenevere Ghazi  at [phone].  Gwenevere Ghazi  Care Guide, Embedded Care Coordination [facility] Management

## 2021-06-29 NOTE — Telephone Encounter (Signed)
Done

## 2021-07-15 ENCOUNTER — Encounter: Payer: Self-pay | Admitting: *Deleted

## 2021-08-16 ENCOUNTER — Encounter: Payer: Self-pay | Admitting: Internal Medicine

## 2021-08-16 ENCOUNTER — Encounter: Admitting: Internal Medicine

## 2021-09-09 ENCOUNTER — Emergency Department (HOSPITAL_BASED_OUTPATIENT_CLINIC_OR_DEPARTMENT_OTHER)

## 2021-09-09 ENCOUNTER — Other Ambulatory Visit: Payer: Self-pay

## 2021-09-09 ENCOUNTER — Emergency Department
Admission: EM | Admit: 2021-09-09 | Discharge: 2021-09-09 | Disposition: A | Discharge status: Home / Self Care | Attending: Emergency Medicine | Admitting: Emergency Medicine

## 2021-09-09 ENCOUNTER — Encounter (HOSPITAL_BASED_OUTPATIENT_CLINIC_OR_DEPARTMENT_OTHER): Payer: Self-pay

## 2021-09-09 DIAGNOSIS — R739 Hyperglycemia, unspecified: Secondary | ICD-10-CM | POA: Insufficient documentation

## 2021-09-09 DIAGNOSIS — K529 Noninfective gastroenteritis and colitis, unspecified: Secondary | ICD-10-CM | POA: Insufficient documentation

## 2021-09-09 DIAGNOSIS — E860 Dehydration: Secondary | ICD-10-CM | POA: Insufficient documentation

## 2021-09-09 HISTORY — DX: Type 2 diabetes mellitus without complications: E11.9

## 2021-09-09 HISTORY — DX: Unspecified asthma, uncomplicated: J45.909

## 2021-09-09 HISTORY — DX: Essential (primary) hypertension: I10

## 2021-09-09 HISTORY — DX: Chronic obstructive pulmonary disease, unspecified: J44.9

## 2021-09-09 HISTORY — DX: Disorder of thyroid, unspecified: E07.9

## 2021-09-09 LAB — BASIC METABOLIC PANEL
ANION GAP: 10 mmol/L (ref 10–20)
BUN/CREA RATIO: 11
BUN: 14 mg/dL (ref 7–18)
CALCIUM: 7.7 mg/dL — ABNORMAL LOW (ref 8.5–10.1)
CHLORIDE: 100 mmol/L (ref 98–107)
CO2 TOTAL: 25 mmol/L (ref 21–32)
CREATININE: 1.27 mg/dL — ABNORMAL HIGH (ref 0.55–1.02)
ESTIMATED GFR: 53 mL/min/{1.73_m2} — ABNORMAL LOW (ref >59)
GLUCOSE: 395 mg/dL — ABNORMAL HIGH (ref 74–106)
OSMOLALITY, CALCULATED: 287 mOsm/kg (ref 270–290)
POTASSIUM: 3.4 mmol/L — ABNORMAL LOW (ref 3.5–5.1)
SODIUM: 135 mmol/L — ABNORMAL LOW (ref 136–145)

## 2021-09-09 LAB — COMPREHENSIVE METABOLIC PANEL, NON-FASTING
ALBUMIN/GLOBULIN RATIO: 0.8 (ref 0.8–1.4)
ALBUMIN: 2.8 g/dL — ABNORMAL LOW (ref 3.4–5.0)
ALKALINE PHOSPHATASE: 160 U/L — ABNORMAL HIGH (ref 46–116)
ALT (SGPT): 95 U/L — ABNORMAL HIGH (ref <=78)
ANION GAP: 10 mmol/L (ref 10–20)
AST (SGOT): 162 U/L — ABNORMAL HIGH (ref 15–37)
BILIRUBIN TOTAL: 0.5 mg/dL (ref 0.2–1.0)
BUN/CREA RATIO: 11
BUN: 16 mg/dL (ref 7–18)
CALCIUM, CORRECTED: 9 mg/dL
CALCIUM: 7.8 mg/dL — ABNORMAL LOW (ref 8.5–10.1)
CHLORIDE: 94 mmol/L — ABNORMAL LOW (ref 98–107)
CO2 TOTAL: 26 mmol/L (ref 21–32)
CREATININE: 1.51 mg/dL — ABNORMAL HIGH (ref 0.55–1.02)
ESTIMATED GFR: 43 mL/min/{1.73_m2} — ABNORMAL LOW (ref >59)
GLOBULIN: 3.6
GLUCOSE: 713 mg/dL (ref 74–106)
OSMOLALITY, CALCULATED: 296 mOsm/kg — ABNORMAL HIGH (ref 270–290)
POTASSIUM: 4 mmol/L (ref 3.5–5.1)
PROTEIN TOTAL: 6.4 g/dL (ref 6.4–8.2)
SODIUM: 130 mmol/L — ABNORMAL LOW (ref 136–145)

## 2021-09-09 LAB — CBC WITH DIFF
BASOPHIL #: 0.01 10*3/uL (ref 0.00–0.30)
BASOPHIL %: 0 % (ref 0–3)
EOSINOPHIL #: 0.03 10*3/uL (ref 0.00–0.80)
EOSINOPHIL %: 0 % (ref 0–7)
HCT: 38.4 % (ref 37.0–47.0)
HGB: 13.4 g/dL (ref 12.5–16.0)
LYMPHOCYTE #: 0.32 10*3/uL — ABNORMAL LOW (ref 1.10–5.00)
LYMPHOCYTE %: 4 % — ABNORMAL LOW (ref 25–45)
MCH: 28.8 pg (ref 27.0–32.0)
MCHC: 34.8 g/dL (ref 32.0–36.0)
MCV: 82.6 fL (ref 78.0–99.0)
MONOCYTE #: 0.32 10*3/uL (ref 0.00–1.30)
MONOCYTE %: 4 % (ref 0–12)
MPV: 10 fL (ref 7.4–10.4)
NEUTROPHIL #: 6.98 10*3/uL (ref 1.80–8.40)
NEUTROPHIL %: 91 % — ABNORMAL HIGH (ref 40–76)
PLATELETS: 202 10*3/uL (ref 140–440)
RBC: 4.65 10*6/uL (ref 4.20–5.40)
RDW: 16.3 % — ABNORMAL HIGH (ref 11.6–14.8)
WBC: 7.7 10*3/uL (ref 4.0–10.5)

## 2021-09-09 LAB — URINALYSIS, MICROSCOPIC

## 2021-09-09 LAB — URINALYSIS, MACRO/MICRO
BILIRUBIN: NEGATIVE mg/dL
GLUCOSE: >=1000 mg/dL — AB
KETONES: NEGATIVE mg/dL
LEUKOCYTES: NEGATIVE WBCs/uL
NITRITE: NEGATIVE
PH: 6 (ref 4.6–8.0)
PROTEIN: NEGATIVE mg/dL
SPECIFIC GRAVITY: 1.01 (ref 1.003–1.035)
UROBILINOGEN: 0.2 mg/dL (ref 0.2–1.0)

## 2021-09-09 LAB — POC BLOOD GLUCOSE (RESULTS)
GLUCOSE, POC: 460 mg/dl (ref 50–500)
GLUCOSE, POC: 368 mg/dl (ref 50–500)
GLUCOSE, POC: 308 mg/dl (ref 50–500)

## 2021-09-09 LAB — HCG, URINE QUALITATIVE, PREGNANCY: HCG URINE QUALITATIVE: NEGATIVE

## 2021-09-09 LAB — LIPASE: LIPASE: 194 U/L (ref 73–393)

## 2021-09-09 MED ORDER — INSULIN REGULAR HUMAN 100 UNIT/ML INJECTION - CHARGE BY DOSE
8.0000 [IU] | INTRAMUSCULAR | Status: AC
Start: 2021-09-09 — End: 2021-09-09
  Administered 2021-09-09: 8 [IU] via INTRAVENOUS

## 2021-09-09 MED ORDER — ONDANSETRON HCL (PF) 4 MG/2 ML INJECTION SOLUTION
4.0000 mg | INTRAMUSCULAR | Status: AC
Start: 2021-09-09 — End: 2021-09-09
  Administered 2021-09-09: 4 mg via INTRAVENOUS

## 2021-09-09 MED ORDER — INSULIN REGULAR HUMAN 100 UNIT/ML INJECTION - CHARGE BY DOSE
12.0000 [IU] | INTRAMUSCULAR | Status: AC
Start: 2021-09-09 — End: 2021-09-09
  Administered 2021-09-09: 12 [IU] via INTRAVENOUS

## 2021-09-09 MED ORDER — INSULIN U-100 REGULAR HUMAN 100 UNIT/ML INJECTION SOLUTION
INTRAMUSCULAR | Status: AC
Start: 2021-09-09 — End: 2021-09-09
  Filled 2021-09-09: qty 36

## 2021-09-09 MED ORDER — INSULIN REGULAR HUMAN 100 UNIT/ML INJECTION - CHARGE BY DOSE
12.0000 [IU] | INTRAMUSCULAR | Status: DC
Start: 2021-09-09 — End: 2021-09-09

## 2021-09-09 MED ORDER — INSULIN U-100 REGULAR HUMAN 100 UNIT/ML INJECTION SOLUTION
INTRAMUSCULAR | Status: AC
Start: 2021-09-09 — End: 2021-09-09
  Filled 2021-09-09: qty 24

## 2021-09-09 MED ORDER — SODIUM CHLORIDE 0.9 % IV BOLUS
1000.0000 mL | INJECTION | Status: AC
Start: 2021-09-09 — End: 2021-09-09
  Administered 2021-09-09: 1000 mL via INTRAVENOUS
  Administered 2021-09-09: 0 mL via INTRAVENOUS

## 2021-09-09 MED ORDER — ONDANSETRON HCL (PF) 4 MG/2 ML INJECTION SOLUTION
INTRAMUSCULAR | Status: AC
Start: 2021-09-09 — End: 2021-09-09
  Filled 2021-09-09: qty 2

## 2021-09-09 MED ORDER — ONDANSETRON 4 MG DISINTEGRATING TABLET
4.0000 mg | ORAL_TABLET | Freq: Three times a day (TID) | ORAL | 0 refills | Status: DC | PRN
Start: 2021-09-09 — End: 2022-06-16

## 2021-09-09 NOTE — ED Nurses Note (Signed)
Glucose 713 reported to dr Ermalinda Memos

## 2021-09-09 NOTE — ED Triage Notes (Addendum)
Stomach ache mid abdomen yest, took pepto bismal. This am at work felt like might pass out, nausea, vomited x 2, can not hear out of left ear , + dizziness

## 2021-09-09 NOTE — ED Provider Notes (Signed)
[facility], [facility] Emergency Department  ED Primary Provider Note  History of Present Illness   Chief Complaint   Patient presents with   . Dizziness     Jennifer [name] is a 46 y.o. female who had concerns including Dizziness.  Arrival: The patient arrived by Car complaining of feeling lightheaded dizzy this morning after trying to go to work.  Patient states she vomited twice this morning and has felt like she was going to have diarrhea but never did.  Patient states it is worse when she tries to stand up suddenly.  Patient denies any vertigo.  Patient denies any chest pain or shortness of breath.  Patient is complaining of cramping epigastric type pain.  Patient denies eating any food that was old or tasted funny.  Patient denies any dysuria or increased frequency.  No fever or chills.  Patient states her left ear is clogged where she can not hear out of it.  Patient denies any sore throat or severe headache.  Patient denies any black or bloody stools.    HPI  Review of Systems   Review of Systems   Constitutional: Positive for activity change, appetite change and fatigue. Negative for chills and fever.   HENT: Negative for ear pain and sore throat.    Eyes: Negative for pain and visual disturbance.   Respiratory: Negative for cough and shortness of breath.    Cardiovascular: Negative for chest pain and palpitations.   Gastrointestinal: Positive for abdominal pain, nausea and vomiting.   Genitourinary: Negative for dysuria and hematuria.   Musculoskeletal: Negative for arthralgias and back pain.   Skin: Negative for color change and rash.   Neurological: Positive for dizziness, weakness and light-headedness. Negative for seizures and syncope.   All other systems reviewed and are negative.     Historical Data   History Reviewed This Encounter:     Physical Exam   ED Triage Vitals [09/09/21 0900]   BP (Non-Invasive) 96/60   Heart Rate 94   Respiratory Rate 20   Temperature 36.4  C (97.5 F)   SpO2 99 %   Weight 95.3 kg (210 lb)   Height 1.702 m (_0 )     Physical Exam  Vitals and nursing note reviewed.   Constitutional:       General: She is not in acute distress.     Appearance: She is well-developed. She is obese. She is ill-appearing.   HENT:      Head: Normocephalic and atraumatic.      Right Ear: Tympanic membrane, ear canal and external ear normal.      Left Ear: Tympanic membrane, ear canal and external ear normal.      Nose: Nose normal.      Mouth/Throat:      Mouth: Mucous membranes are dry.   Eyes:      Extraocular Movements: Extraocular movements intact.      Conjunctiva/sclera: Conjunctivae normal.      Pupils: Pupils are equal, round, and reactive to light.   Cardiovascular:      Rate and Rhythm: Normal rate and regular rhythm.      Pulses: Normal pulses.      Heart sounds: Normal heart sounds. No murmur heard.  Pulmonary:      Effort: Pulmonary effort is normal. No respiratory distress.      Breath sounds: Normal breath sounds.   Abdominal:      Palpations: Abdomen is soft.  Tenderness: There is abdominal tenderness.      Comments: Positive tenderness over the epigastric area.  Bowel sounds were hyperactive all over the abdomen.  Patient denied any guarding or rebound.   Musculoskeletal:         General: No swelling. Normal range of motion.      Cervical back: Normal range of motion and neck supple.   Skin:     General: Skin is warm and dry.      Capillary Refill: Capillary refill takes less than 2 seconds.   Neurological:      General: No focal deficit present.      Mental Status: She is alert and oriented to person, place, and time.   Psychiatric:         Mood and Affect: Mood normal.         Thought Content: Thought content normal.         Judgment: Judgment normal.       Patient Data     Labs Ordered/Reviewed   COMPREHENSIVE METABOLIC PANEL, NON-FASTING - Abnormal; Notable for the following components:       Result Value    SODIUM 130 (*)     CHLORIDE 94 (*)      CREATININE 1.51 (*)     ESTIMATED GFR 43 (*)     ALBUMIN 2.8 (*)     CALCIUM 7.8 (*)     GLUCOSE 713 (*)     ALKALINE PHOSPHATASE 160 (*)     ALT (SGPT) 95 (*)     AST (SGOT) 162 (*)     OSMOLALITY, CALCULATED 296 (*)     All other components within normal limits    Narrative:     Estimated Glomerular Filtration Rate (eGFR) is calculated using the CKD-EPI (2021) equation, intended for patients 3 years of age and older. If gender is not documented or "unknown", there will be no eGFR calculation.   CBC WITH DIFF - Abnormal; Notable for the following components:    RDW 16.3 (*)     NEUTROPHIL % 91 (*)     LYMPHOCYTE % 4 (*)     LYMPHOCYTE # 0.32 (*)     All other components within normal limits   URINALYSIS, MACRO/MICRO - Abnormal; Notable for the following components:    GLUCOSE >=1000 (*)     BLOOD Moderate (*)     All other components within normal limits   BASIC METABOLIC PANEL - Abnormal; Notable for the following components:    SODIUM 135 (*)     POTASSIUM 3.4 (*)     CALCIUM 7.7 (*)     GLUCOSE 395 (*)     CREATININE 1.27 (*)     ESTIMATED GFR 53 (*)     All other components within normal limits    Narrative:     Estimated Glomerular Filtration Rate (eGFR) is calculated using the CKD-EPI (2021) equation, intended for patients 61 years of age and older. If gender is not documented or "unknown", there will be no eGFR calculation.   URINALYSIS, MICROSCOPIC - Abnormal; Notable for the following components:    BACTERIA Rare (*)     All other components within normal limits   LIPASE - Normal   POC BLOOD GLUCOSE (RESULTS) - Normal   POC BLOOD GLUCOSE (RESULTS) - Normal   POC BLOOD GLUCOSE (RESULTS) - Normal   CBC/DIFF    Narrative:     The following orders were created for panel order CBC/DIFF.  Procedure                               Abnormality         Status                     ---------                               -----------         ------                     CBC WITH HUOH[[id number]]                Abnormal             Final result                 Please view results for these tests on the individual orders.   URINALYSIS WITH REFLEX MICROSCOPIC AND CULTURE IF POSITIVE    Narrative:     The following orders were created for panel order URINALYSIS WITH REFLEX MICROSCOPIC AND CULTURE IF POSITIVE.  Procedure                               Abnormality         Status                     ---------                               -----------         ------                     URINALYSIS, MACRO/MICRO[[id number]]      Abnormal            Final result               URINALYSIS, MICROSCOPIC[[id number]]      Abnormal            Final result                 Please view results for these tests on the individual orders.   HCG, URINE QUALITATIVE, PREGNANCY   PERFORM POC WHOLE BLOOD GLUCOSE   PERFORM POC WHOLE BLOOD GLUCOSE   PERFORM POC WHOLE BLOOD GLUCOSE     CT ABDOMEN PELVIS WO IV CONTRAST   Final Result by Edi, Radresults In (04/13 1056)   THERE IS SOME MILDLY DILATED LOOPS OF SMALL BOWEL IN THE LEFT UPPER QUADRANT WITH SOME ADJACENT INFLAMMATORY CHANGE IN THE MESENTERIC FAT. THIS COULD BE DUE TO FOCAL ILEUS OR EARLY SMALL BOWEL OBSTRUCTION. ENTERITIS WOULD ALSO BE A CONSIDERATION.         One or more dose reduction techniques were used (e.g., Automated exposure control, adjustment of the mA and/or kV according to patient size, use of iterative reconstruction technique).         Radiologist location ID: [id number]         XR KUB AND UPRIGHT ABDOMEN   Final Result by Edi, Radresults In (04/13 0946)   AIR-FLUID LEVELS IN MILDLY DILATED LOOPS OF SMALL BOWEL IN THE LEFT UPPER QUADRANT. THIS COULD BE DUE  TO AN ILEUS OR ENTERITIS. AN EARLY OR PARTIAL SMALL BOWEL OBSTRUCTION IS NOT EXCLUDED. NO FREE AND PERONEAL AIR.            Radiologist location ID: [place] Decision Making  Patient is a 46 year old black female complaining of nausea vomiting and cramping abdominal pain.  Patient will be  hydrated with normal saline and have lab work at x-rays done of her abdomen.  Patient will also be given an anti emetic at this point.  Once labs and x-ray returned patient will more than likely be discharged with a gastroenteritis and dehydration.    Amount and/or Complexity of Data Reviewed  Labs: ordered.  Radiology: ordered.      Risk  Prescription drug management.               Medications Administered in the ED   insulin R human 100 units/mL injection (has no administration in time range)   NS bolus infusion 1,000 mL (0 mL Intravenous Stopped 09/09/21 1020)   ondansetron (ZOFRAN) 2 mg/mL injection (4 mg Intravenous Given 09/09/21 0921)   NS bolus infusion 1,000 mL (0 mL Intravenous Stopped 09/09/21 1138)   insulin R human 100 units/mL injection (12 Units Intravenous Given 09/09/21 1000)   NS bolus infusion 1,000 mL (0 mL Intravenous Stopped 09/09/21 1350)     Clinical Impression   Hyperglycemia (Primary)   Dehydration   Gastroenteritis       Disposition: Discharged               Clinical Impression   Hyperglycemia (Primary)   Dehydration   Gastroenteritis       Current Discharge Medication List      START taking these medications    Details   ondansetron (ZOFRAN ODT) 4 mg Oral Tablet, Rapid Dissolve Take 1 Tablet (4 mg total) by mouth Every 8 hours as needed for Nausea/Vomiting  Qty: 12 Tablet, Refills: 0

## 2021-09-09 NOTE — ED Nurses Note (Signed)
Challenge of ice chips for dry mouth

## 2021-09-15 ENCOUNTER — Emergency Department (HOSPITAL_BASED_OUTPATIENT_CLINIC_OR_DEPARTMENT_OTHER)

## 2021-09-15 ENCOUNTER — Encounter (HOSPITAL_BASED_OUTPATIENT_CLINIC_OR_DEPARTMENT_OTHER): Payer: Self-pay

## 2021-09-15 ENCOUNTER — Other Ambulatory Visit: Payer: Self-pay

## 2021-09-15 ENCOUNTER — Emergency Department
Admission: EM | Admit: 2021-09-15 | Discharge: 2021-09-15 | Disposition: A | Discharge status: Home / Self Care | Attending: Family | Admitting: Family

## 2021-09-15 DIAGNOSIS — N390 Urinary tract infection, site not specified: Secondary | ICD-10-CM | POA: Insufficient documentation

## 2021-09-15 DIAGNOSIS — R739 Hyperglycemia, unspecified: Secondary | ICD-10-CM | POA: Insufficient documentation

## 2021-09-15 DIAGNOSIS — K3184 Gastroparesis: Secondary | ICD-10-CM | POA: Insufficient documentation

## 2021-09-15 DIAGNOSIS — R531 Weakness: Secondary | ICD-10-CM | POA: Insufficient documentation

## 2021-09-15 DIAGNOSIS — E860 Dehydration: Secondary | ICD-10-CM | POA: Insufficient documentation

## 2021-09-15 DIAGNOSIS — E876 Hypokalemia: Secondary | ICD-10-CM

## 2021-09-15 LAB — URINALYSIS, MICROSCOPIC: HYALINE CASTS: 3 /lpf — ABNORMAL HIGH (ref <0)

## 2021-09-15 LAB — COMPREHENSIVE METABOLIC PANEL, NON-FASTING
ALBUMIN/GLOBULIN RATIO: 0.8 (ref 0.8–1.4)
ALBUMIN: 2.9 g/dL — ABNORMAL LOW (ref 3.4–5.0)
ALKALINE PHOSPHATASE: 109 U/L (ref 46–116)
ALT (SGPT): 46 U/L (ref <=78)
ANION GAP: 7 mmol/L — ABNORMAL LOW (ref 10–20)
AST (SGOT): 36 U/L (ref 15–37)
BILIRUBIN TOTAL: 0.3 mg/dL (ref 0.2–1.0)
BUN/CREA RATIO: 16
BUN: 16 mg/dL (ref 7–18)
CALCIUM, CORRECTED: 9.4 mg/dL
CALCIUM: 8.3 mg/dL — ABNORMAL LOW (ref 8.5–10.1)
CHLORIDE: 102 mmol/L (ref 98–107)
CO2 TOTAL: 27 mmol/L (ref 21–32)
CREATININE: 0.98 mg/dL (ref 0.55–1.02)
ESTIMATED GFR: 72 mL/min/{1.73_m2} (ref >59)
GLOBULIN: 3.7
GLUCOSE: 290 mg/dL — ABNORMAL HIGH (ref 74–106)
OSMOLALITY, CALCULATED: 284 mOsm/kg (ref 270–290)
POTASSIUM: 3.4 mmol/L — ABNORMAL LOW (ref 3.5–5.1)
PROTEIN TOTAL: 6.6 g/dL (ref 6.4–8.2)
SODIUM: 136 mmol/L (ref 136–145)

## 2021-09-15 LAB — CBC WITH DIFF
BASOPHIL #: 0.03 10*3/uL (ref 0.00–0.30)
BASOPHIL %: 0 % (ref 0–3)
EOSINOPHIL #: 0.08 10*3/uL (ref 0.00–0.80)
EOSINOPHIL %: 1 % (ref 0–7)
HCT: 37.9 % (ref 37.0–47.0)
HGB: 13.3 g/dL (ref 12.5–16.0)
LYMPHOCYTE #: 2.05 10*3/uL (ref 1.10–5.00)
LYMPHOCYTE %: 19 % — ABNORMAL LOW (ref 25–45)
MCH: 29.1 pg (ref 27.0–32.0)
MCHC: 35.1 g/dL (ref 32.0–36.0)
MCV: 82.7 fL (ref 78.0–99.0)
MONOCYTE #: 0.55 10*3/uL (ref 0.00–1.30)
MONOCYTE %: 5 % (ref 0–12)
MPV: 9.5 fL (ref 7.4–10.4)
NEUTROPHIL #: 8.05 10*3/uL (ref 1.80–8.40)
NEUTROPHIL %: 75 % (ref 40–76)
PLATELETS: 263 10*3/uL (ref 140–440)
RBC: 4.58 10*6/uL (ref 4.20–5.40)
RDW: 17.2 % — ABNORMAL HIGH (ref 11.6–14.8)
WBC: 10.8 10*3/uL — ABNORMAL HIGH (ref 4.0–10.5)

## 2021-09-15 LAB — URINALYSIS, MACRO/MICRO
BILIRUBIN: NEGATIVE mg/dL
BLOOD: NEGATIVE mg/dL
GLUCOSE: 500 mg/dL — AB
NITRITE: NEGATIVE
PH: 5.5 (ref 4.6–8.0)
SPECIFIC GRAVITY: >=1.03 (ref 1.003–1.035)
UROBILINOGEN: 0.2 mg/dL (ref 0.2–1.0)

## 2021-09-15 LAB — VENOUS BLOOD GAS/LACTATE
BASE EXCESS: 2 mmol/L (ref <=3.0)
BICARBONATE (VENOUS): 28 mmol/L — ABNORMAL HIGH (ref 22.0–26.0)
LACTATE: 1.5 mmol/L (ref <=4.0)
PCO2 (VENOUS): 48 mm/Hg (ref 41–51)
PH (VENOUS): 7.37 (ref 7.31–7.41)

## 2021-09-15 LAB — LIPASE: LIPASE: 156 U/L (ref 73–393)

## 2021-09-15 LAB — ECG 12 LEAD
Calculated R Axis: 15 degrees
QT Interval: 394 ms
QTC Calculation: 468 ms
Ventricular rate: 85 {beats}/min

## 2021-09-15 LAB — TROPONIN-I: TROPONIN I: <2 ng/L (ref <15)

## 2021-09-15 LAB — MAGNESIUM: MAGNESIUM: 1.6 mg/dL — ABNORMAL LOW (ref 1.8–2.4)

## 2021-09-15 LAB — ACETONE: ACETONE SERUM: NEGATIVE

## 2021-09-15 MED ORDER — SODIUM CHLORIDE 0.9 % (FLUSH) INJECTION SYRINGE
3.0000 mL | INJECTION | INTRAMUSCULAR | Status: DC | PRN
Start: 2021-09-15 — End: 2021-09-15

## 2021-09-15 MED ORDER — SODIUM CHLORIDE 0.9 % IV BOLUS
1000.0000 mL | INJECTION | Status: AC
Start: 2021-09-15 — End: 2021-09-15
  Administered 2021-09-15: 0 mL via INTRAVENOUS
  Administered 2021-09-15: 1000 mL via INTRAVENOUS

## 2021-09-15 MED ORDER — POTASSIUM BICARBONATE-CITRIC ACID 25 MEQ EFFERVESCENT TABLET
EFFERVESCENT_TABLET | ORAL | Status: AC
Start: 2021-09-15 — End: 2021-09-15
  Filled 2021-09-15: qty 1

## 2021-09-15 MED ORDER — MAGNESIUM SULFATE 1 GRAM/100 ML IN DEXTROSE 5 % INTRAVENOUS PIGGYBACK
INJECTION | INTRAVENOUS | Status: AC
Start: 2021-09-15 — End: 2021-09-15
  Filled 2021-09-15: qty 200

## 2021-09-15 MED ORDER — PANTOPRAZOLE 40 MG INTRAVENOUS SOLUTION
40.0000 mg | INTRAVENOUS | Status: AC
Start: 2021-09-15 — End: 2021-09-15
  Administered 2021-09-15: 40 mg via INTRAVENOUS

## 2021-09-15 MED ORDER — SODIUM CHLORIDE 0.9 % IV BOLUS
1000.0000 mL | INJECTION | Status: AC
Start: 2021-09-15 — End: 2021-09-15
  Administered 2021-09-15: 1000 mL via INTRAVENOUS
  Administered 2021-09-15: 0 mL via INTRAVENOUS

## 2021-09-15 MED ORDER — POTASSIUM BICARBONATE-CITRIC ACID 25 MEQ EFFERVESCENT TABLET
25.0000 meq | EFFERVESCENT_TABLET | ORAL | Status: AC
Start: 2021-09-15 — End: 2021-09-15
  Administered 2021-09-15: 25 meq via ORAL

## 2021-09-15 MED ORDER — NITROFURANTOIN MONOHYDRATE/MACROCRYSTALS 100 MG CAPSULE
100.0000 mg | ORAL_CAPSULE | Freq: Two times a day (BID) | ORAL | 0 refills | Status: AC
Start: 2021-09-15 — End: 2021-09-22

## 2021-09-15 MED ORDER — SODIUM CHLORIDE 0.9 % (FLUSH) INJECTION SYRINGE
3.0000 mL | INJECTION | Freq: Three times a day (TID) | INTRAMUSCULAR | Status: DC
Start: 2021-09-15 — End: 2021-09-15

## 2021-09-15 MED ORDER — MAGNESIUM SULFATE 1 GRAM/100 ML IN DEXTROSE 5 % INTRAVENOUS PIGGYBACK
1.0000 g | INJECTION | Freq: Once | INTRAVENOUS | Status: AC
Start: 2021-09-15 — End: 2021-09-15
  Administered 2021-09-15: 0 g via INTRAVENOUS
  Administered 2021-09-15: 1 g via INTRAVENOUS

## 2021-09-15 MED ORDER — PANTOPRAZOLE 40 MG INTRAVENOUS SOLUTION
INTRAVENOUS | Status: AC
Start: 2021-09-15 — End: 2021-09-15
  Filled 2021-09-15: qty 10

## 2021-09-15 MED ORDER — CEFTRIAXONE 1 GRAM SOLUTION FOR INJECTION
INTRAMUSCULAR | Status: AC
Start: 2021-09-15 — End: 2021-09-15
  Filled 2021-09-15: qty 10

## 2021-09-15 MED ORDER — SODIUM CHLORIDE 0.9 % INTRAVENOUS PIGGYBACK
1.0000 g | INTRAVENOUS | Status: AC
Start: 2021-09-15 — End: 2021-09-15
  Administered 2021-09-15: 1 g via INTRAVENOUS
  Administered 2021-09-15: 0 g via INTRAVENOUS

## 2021-09-15 MED ORDER — SODIUM CHLORIDE 0.9 % INTRAVENOUS PIGGYBACK
INJECTION | INTRAVENOUS | Status: AC
Start: 2021-09-15 — End: 2021-09-15
  Filled 2021-09-15: qty 50

## 2021-09-15 MED ORDER — FAMOTIDINE 40 MG TABLET
40.0000 mg | ORAL_TABLET | Freq: Two times a day (BID) | ORAL | 0 refills | Status: DC
Start: 2021-09-15 — End: 2023-06-22

## 2021-09-15 MED ORDER — ONDANSETRON 4 MG DISINTEGRATING TABLET
4.0000 mg | ORAL_TABLET | Freq: Three times a day (TID) | ORAL | 0 refills | Status: DC | PRN
Start: 2021-09-15 — End: 2022-06-16

## 2021-09-15 MED ORDER — FLUCONAZOLE 150 MG TABLET
150.0000 mg | ORAL_TABLET | Freq: Once | ORAL | 0 refills | Status: AC
Start: 2021-09-15 — End: 2021-09-15

## 2021-09-15 NOTE — ED Provider Notes (Signed)
[facility], [facility] Emergency Department  ED Primary Provider Note  History of Present Illness   Chief Complaint   Patient presents with   . Nausea     Arrival: The patient arrived by Ambulance    Jennifer [name] is a 46 y.o. female who had concerns including Nausea.  Pt states she feels thirsty, weak, dizzy.  States was here 6 days ago got fluids felt better and now feels the same as then. Denies sob. Has had nvd. No abd pain  Hx of gastroparesis. Had been out of meds.     Review of Systems   Constitutional: No fever, chills + weakness   Skin: No rash or diaphoresis  HENT: No headaches, or congestion  Eyes: No vision changes or photophobia   Cardio: No chest pain, palpitations or leg swelling   Respiratory: No cough, wheezing or SOB  GI:  + nausea, vomiting , stool changes  GU:  No dysuria, hematuria, or increased frequency  MSK: No muscle aches, joint or back pain  Neuro: No seizures, LOC, numbness, tingling, or focal weakness  Psychiatric: No depression, SI or substance abuse  All other systems reviewed and are negative.    Historical Data   History Reviewed This Encounter:  All noted and reviewed.     Physical Exam   ED Triage Vitals [09/15/21 1629]   BP (Non-Invasive) 104/66   Heart Rate 88   Respiratory Rate 18   Temperature 36.2 C (97.1 F)   SpO2 96 %   Weight 95.3 kg (210 lb)   Height 1.702 m ('5\' 7"' )       Constitutional:  46 y.o. female who appears in no distress. Normal color, no cyanosis.   HENT:   Head: Normocephalic and atraumatic.   Mouth/Throat: Oropharynx is clear and dry.   Eyes: EOMI, PERRL   Neck: Trachea midline. Neck supple.  Cardiovascular: RRR, No murmurs, rubs or gallops. Intact distal pulses.  Pulmonary/Chest: BS equal bilaterally. No respiratory distress. No wheezes, rales or chest tenderness.   Abdominal: Bowel sounds present and normal. Abdomen soft, no tenderness, no rebound and no guarding.  Back: No midline spinal tenderness, no paraspinal  tenderness, no CVA tenderness.           Musculoskeletal: No edema, tenderness or deformity.  Skin: warm and dry. No rash, erythema, pallor or cyanosis  Psychiatric: normal mood and affect. Behavior is normal.   Neurological: Patient keenly alert and responsive, easily able to raise eyebrows, facial muscles/expressions symmetric, speaking in fluent sentences, moving all extremities equally and fully, normal gait  Patient Data     Labs Ordered/Reviewed   COMPREHENSIVE METABOLIC PANEL, NON-FASTING - Abnormal; Notable for the following components:       Result Value    POTASSIUM 3.4 (*)     ANION GAP 7 (*)     ALBUMIN 2.9 (*)     CALCIUM 8.3 (*)     GLUCOSE 290 (*)     All other components within normal limits    Narrative:     Estimated Glomerular Filtration Rate (eGFR) is calculated using the CKD-EPI (2021) equation, intended for patients 42 years of age and older. If gender is not documented or "unknown", there will be no eGFR calculation.   VENOUS BLOOD GAS/LACTATE - Abnormal; Notable for the following components:    BICARBONATE (VENOUS) 28.0 (*)     All other components within normal limits   MAGNESIUM - Abnormal; Notable for the following components:  MAGNESIUM 1.6 (*)     All other components within normal limits   CBC WITH DIFF - Abnormal; Notable for the following components:    WBC 10.8 (*)     RDW 17.2 (*)     LYMPHOCYTE % 19 (*)     All other components within normal limits   URINALYSIS, MACRO/MICRO - Abnormal; Notable for the following components:    LEUKOCYTES Trace (*)     PROTEIN Trace (*)     GLUCOSE 500 (*)     KETONES Trace (*)     All other components within normal limits   URINALYSIS, MICROSCOPIC - Abnormal; Notable for the following components:    BACTERIA Moderate (*)     MUCOUS Many (*)     CALCIUM OXALATE CRYSTALS Moderate (*)     HYALINE CASTS 3 (*)     SQUAMOUS EPITHELIAL Several (*)     All other components within normal limits   LIPASE - Normal   ACETONE - Normal   TROPONIN-I - Normal     Narrative:     Values received on females ranging between 12-15 ng/L MUST include the next serial troponin to review changes in the delta differences as the reference range for the Access II chemistry analyzer is lower than the established reference range.     URINE CULTURE,ROUTINE   CBC/DIFF    Narrative:     The following orders were created for panel order CBC/DIFF.  Procedure                               Abnormality         Status                     ---------                               -----------         ------                     CBC WITH AQTM[[id number]]                Abnormal            Final result                 Please view results for these tests on the individual orders.   URINALYSIS WITH REFLEX MICROSCOPIC AND CULTURE IF POSITIVE    Narrative:     The following orders were created for panel order URINALYSIS WITH REFLEX MICROSCOPIC AND CULTURE IF POSITIVE.  Procedure                               Abnormality         Status                     ---------                               -----------         ------                     URINALYSIS, MACRO/MICRO[[id number]]      Abnormal  Final result               URINALYSIS, MICROSCOPIC[[id number]]      Abnormal            Final result                 Please view results for these tests on the individual orders.     XR KUB AND UPRIGHT ABDOMEN   Final Result by Edi, Radresults In (04/19 1803)   No acute abnormality demonstrated.         Radiologist location ID: [id number]         XR CHEST AP   Final Result by Edi, Radresults In (04/19 1716)   NEGATIVE CHEST         Radiologist location ID: [facility]   Diff dx of gastritis dehydration diabetic ketoacidosis. Sbo, non ketotic hyperglycemia. Electrolyte abnormalities.   ED Course as of 09/15/21 1939   Wed Sep 15, 2021   1916 Urine pending.          Medications Administered in the ED   NS flush syringe (has no administration in time range)   NS flush syringe (has no  administration in time range)   NS bolus infusion 1,000 mL (1,000 mL Intravenous New Bag/New Syringe 09/15/21 1849)   NS bolus infusion 1,000 mL (1,000 mL Intravenous New Bag/New Syringe 09/15/21 1930)   magnesium sulfate 1 G in D5W 100 mL premix IVPB (1 g Intravenous New Bag/New Syringe 09/15/21 1930)   cefTRIAXone (ROCEPHIN) 1 g in NS 50 mL IVPB minibag (has no administration in time range)   NS bolus infusion 1,000 mL (0 mL Intravenous Stopped 09/15/21 1748)   magnesium sulfate 1 G in D5W 100 mL premix IVPB (1 g Intravenous New Bag/New Syringe 09/15/21 1848)   potassium bicarbonate-citric acid (EFFER-K) effervescent tablet (25 mEq Oral Given 09/15/21 1849)   pantoprazole (PROTONIX) injection (40 mg Intravenous Given 09/15/21 1849)     Clinical Impression   Dehydration (Primary)   Weakness   Urinary tract infection   Gastroparesis   Hyperglycemia       Disposition: Discharged

## 2021-09-15 NOTE — Discharge Instructions (Signed)
Close follow up. Return if any concerns.

## 2021-09-15 NOTE — Ancillary Notes (Signed)
EKG completed

## 2021-09-15 NOTE — ED Triage Notes (Signed)
Pt complains of generalized weakness, nausea, diarrhea, dizzy and intermiitent headache. Was here 09/09/2021 for same complaint.  Blood sugar 226.

## 2021-09-16 DIAGNOSIS — I10 Essential (primary) hypertension: Secondary | ICD-10-CM

## 2021-09-16 LAB — ECG 12 LEAD
Atrial Rate: 85 {beats}/min
Calculated P Axis: 69 degrees
Calculated T Axis: 44 degrees
PR Interval: 152 ms
QRS Duration: 82 ms

## 2021-09-17 LAB — URINE CULTURE,ROUTINE: URINE CULTURE: >100000 — AB

## 2021-12-18 ENCOUNTER — Other Ambulatory Visit: Payer: Self-pay

## 2021-12-18 ENCOUNTER — Emergency Department (HOSPITAL_BASED_OUTPATIENT_CLINIC_OR_DEPARTMENT_OTHER)

## 2021-12-18 ENCOUNTER — Encounter (HOSPITAL_BASED_OUTPATIENT_CLINIC_OR_DEPARTMENT_OTHER): Payer: Self-pay

## 2021-12-18 ENCOUNTER — Emergency Department
Admission: EM | Admit: 2021-12-18 | Discharge: 2021-12-18 | Disposition: A | Discharge status: Home / Self Care | Attending: Emergency Medicine | Admitting: Emergency Medicine

## 2021-12-18 DIAGNOSIS — N3000 Acute cystitis without hematuria: Secondary | ICD-10-CM | POA: Insufficient documentation

## 2021-12-18 DIAGNOSIS — R1032 Left lower quadrant pain: Secondary | ICD-10-CM | POA: Insufficient documentation

## 2021-12-18 DIAGNOSIS — E1165 Type 2 diabetes mellitus with hyperglycemia: Secondary | ICD-10-CM | POA: Insufficient documentation

## 2021-12-18 DIAGNOSIS — Z8742 Personal history of other diseases of the female genital tract: Secondary | ICD-10-CM | POA: Insufficient documentation

## 2021-12-18 LAB — COMPREHENSIVE METABOLIC PANEL, NON-FASTING
ALBUMIN/GLOBULIN RATIO: 0.6 — ABNORMAL LOW (ref 0.8–1.4)
ALBUMIN: 3 g/dL — ABNORMAL LOW (ref 3.4–5.0)
ALKALINE PHOSPHATASE: 134 U/L — ABNORMAL HIGH (ref 46–116)
ALT (SGPT): 29 U/L (ref <=78)
ANION GAP: 12 mmol/L (ref 10–20)
AST (SGOT): 21 U/L (ref 15–37)
BILIRUBIN TOTAL: 0.5 mg/dL (ref 0.2–1.0)
BUN/CREA RATIO: 12
BUN: 11 mg/dL (ref 7–18)
CALCIUM, CORRECTED: 10.3 mg/dL
CALCIUM: 9.3 mg/dL (ref 8.5–10.1)
CHLORIDE: 97 mmol/L — ABNORMAL LOW (ref 98–107)
CO2 TOTAL: 26 mmol/L (ref 21–32)
CREATININE: 0.89 mg/dL (ref 0.55–1.02)
ESTIMATED GFR: 81 mL/min/{1.73_m2} (ref >59)
GLOBULIN: 4.7
GLUCOSE: 422 mg/dL — ABNORMAL HIGH (ref 74–106)
OSMOLALITY, CALCULATED: 288 mOsm/kg (ref 270–290)
POTASSIUM: 3.9 mmol/L (ref 3.5–5.1)
PROTEIN TOTAL: 7.7 g/dL (ref 6.4–8.2)
SODIUM: 135 mmol/L — ABNORMAL LOW (ref 136–145)

## 2021-12-18 LAB — CBC WITH DIFF
BASOPHIL #: 0.05 10*3/uL (ref 0.00–0.30)
BASOPHIL %: 1 % (ref 0–3)
EOSINOPHIL #: 0.13 10*3/uL (ref 0.00–0.80)
EOSINOPHIL %: 1 % (ref 0–7)
HCT: 44 % (ref 37.0–47.0)
HGB: 14.8 g/dL (ref 12.5–16.0)
LYMPHOCYTE #: 1.53 10*3/uL (ref 1.10–5.00)
LYMPHOCYTE %: 15 % — ABNORMAL LOW (ref 25–45)
MCH: 28 pg (ref 27.0–32.0)
MCHC: 33.6 g/dL (ref 32.0–36.0)
MCV: 83.4 fL (ref 78.0–99.0)
MONOCYTE #: 0.56 10*3/uL (ref 0.00–1.30)
MONOCYTE %: 5 % (ref 0–12)
MPV: 10.7 fL — ABNORMAL HIGH (ref 7.4–10.4)
NEUTROPHIL #: 8.25 10*3/uL (ref 1.80–8.40)
NEUTROPHIL %: 78 % — ABNORMAL HIGH (ref 40–76)
PLATELETS: 270 10*3/uL (ref 140–440)
RBC: 5.28 10*6/uL (ref 4.20–5.40)
RDW: 16.6 % — ABNORMAL HIGH (ref 11.6–14.8)
WBC: 10.5 10*3/uL (ref 4.0–10.5)

## 2021-12-18 LAB — URINALYSIS, MACRO/MICRO
BILIRUBIN: NEGATIVE mg/dL
GLUCOSE: 500 mg/dL — AB
KETONES: NEGATIVE mg/dL
LEUKOCYTES: NEGATIVE WBCs/uL
NITRITE: NEGATIVE
PH: 6 (ref 4.6–8.0)
PROTEIN: 30 mg/dL — AB
SPECIFIC GRAVITY: 1.015 (ref 1.003–1.035)
UROBILINOGEN: 0.2 mg/dL (ref 0.2–1.0)

## 2021-12-18 LAB — URINALYSIS, MICROSCOPIC

## 2021-12-18 LAB — POC BLOOD GLUCOSE (RESULTS): GLUCOSE, POC: 279 mg/dl (ref 50–500)

## 2021-12-18 LAB — HCG, URINE QUALITATIVE, PREGNANCY: HCG URINE QUALITATIVE: NEGATIVE

## 2021-12-18 LAB — LIPASE: LIPASE: 128 U/L (ref 73–393)

## 2021-12-18 MED ORDER — KETOROLAC 30 MG/ML (1 ML) INJECTION SOLUTION
INTRAMUSCULAR | Status: AC
Start: 2021-12-18 — End: 2021-12-18
  Filled 2021-12-18: qty 1

## 2021-12-18 MED ORDER — CEPHALEXIN 500 MG CAPSULE
500.0000 mg | ORAL_CAPSULE | Freq: Four times a day (QID) | ORAL | 0 refills | Status: AC
Start: 2021-12-18 — End: 2021-12-28

## 2021-12-18 MED ORDER — CEFTRIAXONE 1 GRAM SOLUTION FOR INJECTION
INTRAMUSCULAR | Status: AC
Start: 2021-12-18 — End: 2021-12-18
  Filled 2021-12-18: qty 10

## 2021-12-18 MED ORDER — INSULIN REGULAR HUMAN 100 UNIT/ML INJECTION - CHARGE BY DOSE
10.0000 [IU] | INTRAMUSCULAR | Status: AC
Start: 2021-12-18 — End: 2021-12-18
  Administered 2021-12-18: 10 [IU] via INTRAVENOUS

## 2021-12-18 MED ORDER — PHENAZOPYRIDINE 200 MG TABLET
200.0000 mg | ORAL_TABLET | Freq: Three times a day (TID) | ORAL | 0 refills | Status: DC
Start: 2021-12-18 — End: 2023-06-22

## 2021-12-18 MED ORDER — KETOROLAC 10 MG TABLET
10.0000 mg | ORAL_TABLET | Freq: Four times a day (QID) | ORAL | 0 refills | Status: DC | PRN
Start: 2021-12-18 — End: 2023-06-22

## 2021-12-18 MED ORDER — SODIUM CHLORIDE 0.9 % INTRAVENOUS PIGGYBACK
1.0000 g | INTRAVENOUS | Status: AC
Start: 2021-12-18 — End: 2021-12-18
  Administered 2021-12-18: 1 g via INTRAVENOUS

## 2021-12-18 MED ORDER — INSULIN U-100 REGULAR HUMAN 100 UNIT/ML INJECTION SOLUTION
INTRAMUSCULAR | Status: AC
Start: 2021-12-18 — End: 2021-12-18
  Filled 2021-12-18: qty 30

## 2021-12-18 MED ORDER — SODIUM CHLORIDE 0.9 % IV BOLUS
1000.0000 mL | INJECTION | Status: AC
Start: 2021-12-18 — End: 2021-12-18
  Administered 2021-12-18: 1000 mL via INTRAVENOUS

## 2021-12-18 MED ORDER — SODIUM CHLORIDE 0.9 % INTRAVENOUS PIGGYBACK
INJECTION | INTRAVENOUS | Status: AC
Start: 2021-12-18 — End: 2021-12-18
  Filled 2021-12-18: qty 50

## 2021-12-18 MED ORDER — KETOROLAC 30 MG/ML (1 ML) INJECTION SOLUTION
30.0000 mg | INTRAMUSCULAR | Status: AC
Start: 2021-12-18 — End: 2021-12-18
  Administered 2021-12-18: 30 mg via INTRAVENOUS

## 2021-12-18 NOTE — ED Triage Notes (Signed)
Pt complains of left lower abdominal pain since Thursday night. Denies any vomiting or diarrhea.

## 2021-12-18 NOTE — ED Provider Notes (Signed)
[facility], [facility] Emergency Department  ED Primary Provider Note  History of Present Illness   Chief Complaint   Patient presents with   . Abdominal Pain     Jennifer [name] is a 46 y.o. female who had concerns including Abdominal Pain.  Arrival: The patient arrived by Car complaining of left lower pelvic pain since Thursday.  Patient denies any pain in the abdomen.  Patient denies any radiation of the pain to her back.  Patient has been having dysuria and increased frequency.  Patient does have her tubes tied.  Patient has had ovarian cysts with rupture in the past.  Patient denies any nausea vomiting or diarrhea.  Patient denies any fever chills presently.  Patient denies any black or bloody stools.  No hematuria.  Patient denies any vaginal bleeding or discharge.    HPI  Review of Systems   Review of Systems   Constitutional: Positive for activity change and appetite change. Negative for chills and fever.   HENT: Negative for ear pain and sore throat.    Eyes: Negative for pain and visual disturbance.   Respiratory: Negative for cough and shortness of breath.    Cardiovascular: Negative for chest pain and palpitations.   Gastrointestinal: Negative for abdominal pain and vomiting.   Genitourinary: Positive for dysuria, frequency, pelvic pain and urgency. Negative for hematuria.   Musculoskeletal: Negative for arthralgias and back pain.   Skin: Negative for color change and rash.   Neurological: Negative for seizures and syncope.   All other systems reviewed and are negative.     Historical Data   History Reviewed This Encounter:     Physical Exam   ED Triage Vitals [12/18/21 1018]   BP (Non-Invasive) (!) 155/101   Heart Rate (!) 103   Respiratory Rate 20   Temperature (!) 35.7 C (96.3 F)   SpO2 100 %   Weight 97.5 kg (215 lb)   Height 1.702 m (_0 )     Physical Exam  Vitals and nursing note reviewed.   Constitutional:       General: She is not in acute distress.      Appearance: She is well-developed. She is obese.   HENT:      Head: Normocephalic and atraumatic.      Right Ear: External ear normal.      Left Ear: External ear normal.      Nose: Nose normal.      Mouth/Throat:      Mouth: Mucous membranes are moist.   Eyes:      Extraocular Movements: Extraocular movements intact.      Conjunctiva/sclera: Conjunctivae normal.      Pupils: Pupils are equal, round, and reactive to light.   Cardiovascular:      Rate and Rhythm: Normal rate and regular rhythm.      Pulses: Normal pulses.      Heart sounds: Normal heart sounds. No murmur heard.  Pulmonary:      Effort: Pulmonary effort is normal. No respiratory distress.      Breath sounds: Normal breath sounds.   Abdominal:      General: Bowel sounds are normal.      Palpations: Abdomen is soft.      Tenderness: There is abdominal tenderness.      Comments: Moderate tenderness left pelvic area.  No palpable bladder.   Musculoskeletal:         General: No swelling. Normal range of motion.  Cervical back: Normal range of motion and neck supple.   Skin:     General: Skin is warm and dry.      Capillary Refill: Capillary refill takes less than 2 seconds.   Neurological:      General: No focal deficit present.      Mental Status: She is alert and oriented to person, place, and time.   Psychiatric:         Mood and Affect: Mood normal.         Thought Content: Thought content normal.         Judgment: Judgment normal.       Patient Data     Labs Ordered/Reviewed   COMPREHENSIVE METABOLIC PANEL, NON-FASTING - Abnormal; Notable for the following components:       Result Value    SODIUM 135 (*)     CHLORIDE 97 (*)     ALBUMIN 3.0 (*)     GLUCOSE 422 (*)     ALKALINE PHOSPHATASE 134 (*)     ALBUMIN/GLOBULIN RATIO 0.6 (*)     All other components within normal limits    Narrative:     Estimated Glomerular Filtration Rate (eGFR) is calculated using the CKD-EPI (2021) equation, intended for patients 50 years of age and older. If gender is  not documented or "unknown", there will be no eGFR calculation.   CBC WITH DIFF - Abnormal; Notable for the following components:    RDW 16.6 (*)     MPV 10.7 (*)     NEUTROPHIL % 78 (*)     LYMPHOCYTE % 15 (*)     All other components within normal limits   URINALYSIS, MACRO/MICRO - Abnormal; Notable for the following components:    APPEARANCE Slightly Cloudy (*)     PROTEIN 30 (*)     GLUCOSE 500 (*)     BLOOD Moderate (*)     All other components within normal limits   URINALYSIS, MICROSCOPIC - Abnormal; Notable for the following components:    RBCS 6-10 (*)     BACTERIA Moderate (*)     MUCOUS Few (*)     WBCS 20-50 (*)     WHITE BLOOD CELL CLUMP Moderate (*)     All other components within normal limits   LIPASE - Normal   POC BLOOD GLUCOSE (RESULTS) - Normal   URINE CULTURE,ROUTINE   CBC/DIFF    Narrative:     The following orders were created for panel order CBC/DIFF.  Procedure                               Abnormality         Status                     ---------                               -----------         ------                     CBC WITH XIPJ[[id number]]                Abnormal            Final result  Please view results for these tests on the individual orders.   URINALYSIS WITH REFLEX MICROSCOPIC AND CULTURE IF POSITIVE    Narrative:     The following orders were created for panel order URINALYSIS WITH REFLEX MICROSCOPIC AND CULTURE IF POSITIVE.  Procedure                               Abnormality         Status                     ---------                               -----------         ------                     URINALYSIS, MACRO/MICRO[[id number]]      Abnormal            Final result               URINALYSIS, MICROSCOPIC[[id number]]      Abnormal            Final result                 Please view results for these tests on the individual orders.   HCG, URINE QUALITATIVE, PREGNANCY     CT ABDOMEN PELVIS WO IV CONTRAST   Final Result by Edi, Radresults In (07/22 1131)   NO ACUTE  FINDINGS AT THE ABDOMEN OR PELVIS ON NONCONTRAST CT.          One or more dose reduction techniques were used (e.g., Automated exposure control, adjustment of the mA and/or kV according to patient size, use of iterative reconstruction technique).         Radiologist location ID: [facility]        Medical Decision Making  Patient is 47 year old black female complaining of left pelvic pain since Thursday.  Patient denies any radiation of the pain to her back.  Patient denies any nausea vomiting or diarrhea.  Patient denies any vaginal bleeding or discharge.  Patient has had dysuria and increased frequency.  Patient stated that she is had ovarian cysts with rupture in the past.  She stated this feels similar to the previous cyst.  Patient will have lab work and an IV established for hydration.  Patient will also have a CT scan of her abdomen and pelvis.  There is no ultrasound capability here at [facility].  Patient will also be given analgesia Toradol IV.    Amount and/or Complexity of Data Reviewed  Labs: ordered.  Radiology: ordered.      Risk  Prescription drug management.               Medications Administered in the ED   NS bolus infusion 1,000 mL (1,000 mL Intravenous New Bag/New Syringe 12/18/21 1045)   ketorolac (TORADOL) 30 mg/mL injection (30 mg Intravenous Given 12/18/21 1044)   cefTRIAXone (ROCEPHIN) 1 g in NS 50 mL IVPB minibag (1 g Intravenous New Bag/New Syringe 12/18/21 1135)   insulin R human 100 units/mL injection (10 Units Intravenous Given 12/18/21 1135)   NS bolus infusion 1,000 mL (1,000 mL Intravenous New Bag/New Syringe 12/18/21 1137)  Clinical Impression   Left lower quadrant abdominal pain (Primary)   Acute cystitis without hematuria   Uncontrolled type 2 diabetes mellitus with hyperglycemia (CMS HCC)       Disposition: Discharged               Clinical Impression   Left lower quadrant abdominal pain (Primary)   Acute cystitis without hematuria   Uncontrolled  type 2 diabetes mellitus with hyperglycemia (CMS HCC)       Current Discharge Medication List      START taking these medications    Details   cephalexin (KEFLEX) 500 mg Oral Capsule Take 1 Capsule (500 mg total) by mouth Four times a day for 10 days  Qty: 40 Capsule, Refills: 0      ketorolac tromethamine (TORADOL) 10 mg Oral Tablet Take 1 Tablet (10 mg total) by mouth Every 6 hours as needed for Pain  Qty: 20 Tablet, Refills: 0      phenazopyridine (PYRIDIUM) 200 mg Oral Tablet Take 1 Tablet (200 mg total) by mouth Three times a day  Qty: 9 Tablet, Refills: 0

## 2021-12-20 LAB — URINE CULTURE,ROUTINE: URINE CULTURE: >100000 — AB

## 2022-01-11 ENCOUNTER — Other Ambulatory Visit: Payer: Self-pay

## 2022-01-11 ENCOUNTER — Encounter (HOSPITAL_BASED_OUTPATIENT_CLINIC_OR_DEPARTMENT_OTHER): Payer: Self-pay

## 2022-01-11 ENCOUNTER — Emergency Department
Admission: EM | Admit: 2022-01-11 | Discharge: 2022-01-11 | Disposition: A | Discharge status: Home / Self Care | Attending: Family | Admitting: Family

## 2022-01-11 DIAGNOSIS — E876 Hypokalemia: Secondary | ICD-10-CM | POA: Insufficient documentation

## 2022-01-11 DIAGNOSIS — R197 Diarrhea, unspecified: Secondary | ICD-10-CM

## 2022-01-11 DIAGNOSIS — Z794 Long term (current) use of insulin: Secondary | ICD-10-CM | POA: Insufficient documentation

## 2022-01-11 DIAGNOSIS — E119 Type 2 diabetes mellitus without complications: Secondary | ICD-10-CM | POA: Insufficient documentation

## 2022-01-11 DIAGNOSIS — E875 Hyperkalemia: Secondary | ICD-10-CM | POA: Insufficient documentation

## 2022-01-11 LAB — CBC WITH DIFF
BASOPHIL #: 0.05 10*3/uL (ref 0.00–0.30)
BASOPHIL %: 1 % (ref 0–3)
EOSINOPHIL #: 0.08 10*3/uL (ref 0.00–0.80)
EOSINOPHIL %: 1 % (ref 0–7)
HCT: 39.4 % (ref 37.0–47.0)
HGB: 13.2 g/dL (ref 12.5–16.0)
LYMPHOCYTE #: 1.47 10*3/uL (ref 1.10–5.00)
LYMPHOCYTE %: 14 % — ABNORMAL LOW (ref 25–45)
MCH: 27.9 pg (ref 27.0–32.0)
MCHC: 33.6 g/dL (ref 32.0–36.0)
MCV: 83.2 fL (ref 78.0–99.0)
MONOCYTE #: 0.39 10*3/uL (ref 0.00–1.30)
MONOCYTE %: 4 % (ref 0–12)
MPV: 10.5 fL — ABNORMAL HIGH (ref 7.4–10.4)
NEUTROPHIL #: 8.43 10*3/uL — ABNORMAL HIGH (ref 1.80–8.40)
NEUTROPHIL %: 81 % — ABNORMAL HIGH (ref 40–76)
PLATELETS: 265 10*3/uL (ref 140–440)
RBC: 4.74 10*6/uL (ref 4.20–5.40)
RDW: 17 % — ABNORMAL HIGH (ref 11.6–14.8)
WBC: 10.4 10*3/uL (ref 4.0–10.5)

## 2022-01-11 LAB — MAGNESIUM: MAGNESIUM: 1.5 mg/dL — ABNORMAL LOW (ref 1.8–2.4)

## 2022-01-11 LAB — COMPREHENSIVE METABOLIC PANEL, NON-FASTING
ALBUMIN/GLOBULIN RATIO: 0.9 (ref 0.8–1.4)
ALBUMIN: 3.2 g/dL — ABNORMAL LOW (ref 3.4–5.0)
ALKALINE PHOSPHATASE: 94 U/L (ref 46–116)
ALT (SGPT): 19 U/L (ref <=78)
ANION GAP: 10 mmol/L (ref 10–20)
AST (SGOT): 20 U/L (ref 15–37)
BILIRUBIN TOTAL: 0.7 mg/dL (ref 0.2–1.0)
BUN/CREA RATIO: 16
BUN: 14 mg/dL (ref 7–18)
CALCIUM, CORRECTED: 9.5 mg/dL
CALCIUM: 8.7 mg/dL (ref 8.5–10.1)
CHLORIDE: 98 mmol/L (ref 98–107)
CO2 TOTAL: 27 mmol/L (ref 21–32)
CREATININE: 0.88 mg/dL (ref 0.55–1.02)
ESTIMATED GFR: 82 mL/min/{1.73_m2} (ref >59)
GLOBULIN: 3.7
GLUCOSE: 333 mg/dL — ABNORMAL HIGH (ref 74–106)
OSMOLALITY, CALCULATED: 284 mOsm/kg (ref 270–290)
POTASSIUM: 3.3 mmol/L — ABNORMAL LOW (ref 3.5–5.1)
PROTEIN TOTAL: 6.9 g/dL (ref 6.4–8.2)
SODIUM: 135 mmol/L — ABNORMAL LOW (ref 136–145)

## 2022-01-11 LAB — POC BLOOD GLUCOSE (RESULTS): GLUCOSE, POC: 329 mg/dl (ref 50–500)

## 2022-01-11 MED ORDER — ONDANSETRON 4 MG DISINTEGRATING TABLET
4.0000 mg | ORAL_TABLET | Freq: Three times a day (TID) | ORAL | 0 refills | Status: DC | PRN
Start: 2022-01-11 — End: 2022-06-16

## 2022-01-11 MED ORDER — METFORMIN 1,000 MG TABLET
1000.0000 mg | ORAL_TABLET | Freq: Two times a day (BID) | ORAL | 4 refills | Status: DC
Start: 2022-01-11 — End: 2023-06-22

## 2022-01-11 MED ORDER — INSULIN LISPRO 100 UNIT/ML SUB-Q - CHARGE BY DOSE
5.0000 [IU] | Freq: Once | SUBCUTANEOUS | Status: AC
Start: 2022-01-11 — End: 2022-01-11
  Administered 2022-01-11: 5 [IU] via SUBCUTANEOUS

## 2022-01-11 MED ORDER — INSULIN LISPRO 100 UNIT/ML SUB-Q - CHARGE BY DOSE
SUBCUTANEOUS | Status: AC
Start: 2022-01-11 — End: 2022-01-11
  Filled 2022-01-11: qty 5

## 2022-01-11 MED ORDER — DICYCLOMINE 10 MG CAPSULE
10.0000 mg | ORAL_CAPSULE | Freq: Four times a day (QID) | ORAL | 0 refills | Status: DC
Start: 2022-01-11 — End: 2023-06-22

## 2022-01-11 MED ORDER — LOPERAMIDE 2 MG CAPSULE
2.0000 mg | ORAL_CAPSULE | ORAL | 0 refills | Status: DC | PRN
Start: 2022-01-11 — End: 2023-06-22

## 2022-01-11 MED ORDER — MAGNESIUM SULFATE 1 GRAM/100 ML IN DEXTROSE 5 % INTRAVENOUS PIGGYBACK
INJECTION | INTRAVENOUS | Status: AC
Start: 2022-01-11 — End: 2022-01-11
  Filled 2022-01-11: qty 100

## 2022-01-11 MED ORDER — LIDOCAINE 2 % MUCOSAL JELLY IN APPLICATOR
Freq: Once | Status: AC
Start: 2022-01-11 — End: 2022-01-11
  Administered 2022-01-11: 10 mL via TOPICAL

## 2022-01-11 MED ORDER — POTASSIUM BICARBONATE-CITRIC ACID 25 MEQ EFFERVESCENT TABLET
EFFERVESCENT_TABLET | ORAL | Status: AC
Start: 2022-01-11 — End: 2022-01-11
  Filled 2022-01-11: qty 1

## 2022-01-11 MED ORDER — MAGNESIUM SULFATE 1 GRAM/100 ML IN DEXTROSE 5 % INTRAVENOUS PIGGYBACK
1.0000 g | INJECTION | Freq: Once | INTRAVENOUS | Status: AC
Start: 2022-01-11 — End: 2022-01-11
  Administered 2022-01-11: 1 g via INTRAVENOUS

## 2022-01-11 MED ORDER — LIDOCAINE 2 % MUCOSAL JELLY IN APPLICATOR
Status: AC
Start: 2022-01-11 — End: 2022-01-11
  Filled 2022-01-11: qty 10

## 2022-01-11 MED ORDER — POTASSIUM BICARBONATE-CITRIC ACID 25 MEQ EFFERVESCENT TABLET
25.0000 meq | EFFERVESCENT_TABLET | ORAL | Status: AC
Start: 2022-01-11 — End: 2022-01-11
  Administered 2022-01-11: 25 meq via ORAL

## 2022-01-11 NOTE — ED Provider Notes (Signed)
[facility], [facility] Emergency Department  ED Primary Provider Note  History of Present Illness   Chief Complaint   Patient presents with    Diarrhea     Arrival: The patient arrived by Ambulance    Jennifer [name] is a 46 y.o. female who had concerns including Diarrhea. Diarrhea at work several times. Denies cp sob nor other co. Sent from [facility]. States was at work and has to leave reports hunger on arrival. Denies recent antibiotics.     Review of Systems   Constitutional: No fever, chills or weakness   Skin: No rash or diaphoresis  HENT: No headaches, or congestion  Eyes: No vision changes or photophobia   Cardio: No chest pain, palpitations or leg swelling   Respiratory: No cough, wheezing or SOB  GI:  No nausea, vomiting +stool changes  GU:  No dysuria, hematuria, or increased frequency  MSK: No muscle aches, joint or back pain  Neuro: No seizures, LOC, numbness, tingling, or focal weakness  Psychiatric: No depression, SI or substance abuse  All other systems reviewed and are negative.    Historical Data   History Reviewed This Encounter: all noted and reviewed      Physical Exam   ED Triage Vitals [01/11/22 1541]   BP (Non-Invasive) 128/80   Heart Rate 94   Respiratory Rate 18   Temperature (!) 35.6 C (96.1 F)   SpO2 96 %   Weight 95.3 kg (210 lb)   Height 1.702 m ('5\' 7"' )     Constitutional:  46 y.o. female who appears in no distress. Normal color, no cyanosis.   HENT:   Head: Normocephalic and atraumatic.   Mouth/Throat: Oropharynx is clear and moist.   Eyes: EOMI, PERRL   Neck: Trachea midline. Neck supple.  Cardiovascular: RRR, No murmurs, rubs or gallops. Intact distal pulses.  Pulmonary/Chest: BS equal bilaterally. No respiratory distress. No wheezes, rales or chest tenderness.   Abdominal: Bowel sounds present and normal. Abdomen soft, no tenderness, no rebound and no guarding.  Back: No midline spinal tenderness, no paraspinal tenderness, no CVA tenderness.            Musculoskeletal: No edema, tenderness or deformity.  Skin: warm and dry. No rash, erythema, pallor or cyanosis  Psychiatric: normal mood and affect. Behavior is normal.   Neurological: Patient keenly alert and responsive, easily able to raise eyebrows, facial muscles/expressions symmetric, speaking in fluent sentences, moving all extremities equally and fully, normal gait  Patient Data     Labs Ordered/Reviewed   COMPREHENSIVE METABOLIC PANEL, NON-FASTING - Abnormal; Notable for the following components:       Result Value    SODIUM 135 (*)     POTASSIUM 3.3 (*)     ALBUMIN 3.2 (*)     GLUCOSE 333 (*)     All other components within normal limits    Narrative:     Estimated Glomerular Filtration Rate (eGFR) is calculated using the CKD-EPI (2021) equation, intended for patients 73 years of age and older. If gender is not documented or "unknown", there will be no eGFR calculation.   MAGNESIUM - Abnormal; Notable for the following components:    MAGNESIUM 1.5 (*)     All other components within normal limits   CBC WITH DIFF - Abnormal; Notable for the following components:    RDW 17.0 (*)     MPV 10.5 (*)     NEUTROPHIL % 81 (*)  LYMPHOCYTE % 14 (*)     NEUTROPHIL # 8.43 (*)     All other components within normal limits   POC BLOOD GLUCOSE (RESULTS) - Normal   OVA AND PARASITE SCREEN   C. DIFFICILE PCR   CBC/DIFF    Narrative:     The following orders were created for panel order CBC/DIFF.  Procedure                               Abnormality         Status                     ---------                               -----------         ------                     CBC WITH GHWE[[id number]]                Abnormal            Final result                 Please view results for these tests on the individual orders.     No orders to display     Medical Decision Making   Diff Dx dehydration. Dm complication. Colitis.       Medications Administered in the ED   insulin lispro 100 units/mL injection (5 Units Subcutaneous  Given 01/11/22 1605)   lidocaine 2% jelly in applicator (10 mL Topical Given 01/11/22 1606)   magnesium sulfate 1 G in D5W 100 mL premix IVPB (1 g Intravenous New Bag/New Syringe 01/11/22 1701)   potassium bicarbonate-citric acid (EFFER-K) effervescent tablet (25 mEq Oral Given 01/11/22 1705)     Clinical Impression   Hypomagnesemia (Primary)   Diarrhea, unspecified type   Hypokalemia   Hyperkalemia       Disposition: Discharged

## 2022-01-11 NOTE — ED Triage Notes (Signed)
Pt sent from [facility] with complaints of being diaphoretic at the office, complaiing of being dizzy and diarrhea.

## 2022-01-11 NOTE — ED Nurses Note (Signed)
Discharge instructions given to and went over with patient. Understanding stated. Ambulated off unit without problems.

## 2022-01-12 LAB — C. DIFFICILE PCR
C. DIFFICILE TOXIN GENE, PCR: NEGATIVE
PRESUMPTIVE 027/NAP1/BI: NEGATIVE

## 2022-01-13 LAB — OVA AND PARASITE SCREEN: OVA & PARASITE SCREEN TRICHROME: NONE SEEN

## 2022-02-15 ENCOUNTER — Emergency Department
Admission: EM | Admit: 2022-02-15 | Discharge: 2022-02-15 | Disposition: A | Discharge status: Home / Self Care | Attending: Emergency Medicine | Admitting: Emergency Medicine

## 2022-02-15 ENCOUNTER — Other Ambulatory Visit: Payer: Self-pay

## 2022-02-15 DIAGNOSIS — G629 Polyneuropathy, unspecified: Secondary | ICD-10-CM

## 2022-02-15 DIAGNOSIS — E1140 Type 2 diabetes mellitus with diabetic neuropathy, unspecified: Secondary | ICD-10-CM | POA: Insufficient documentation

## 2022-02-15 DIAGNOSIS — J449 Chronic obstructive pulmonary disease, unspecified: Secondary | ICD-10-CM | POA: Insufficient documentation

## 2022-02-15 DIAGNOSIS — Z87891 Personal history of nicotine dependence: Secondary | ICD-10-CM | POA: Insufficient documentation

## 2022-02-15 DIAGNOSIS — M797 Fibromyalgia: Secondary | ICD-10-CM | POA: Insufficient documentation

## 2022-02-15 DIAGNOSIS — F419 Anxiety disorder, unspecified: Secondary | ICD-10-CM | POA: Insufficient documentation

## 2022-02-15 DIAGNOSIS — I10 Essential (primary) hypertension: Secondary | ICD-10-CM | POA: Insufficient documentation

## 2022-02-15 LAB — URINALYSIS, MACROSCOPIC
BILIRUBIN: NEGATIVE mg/dL
BLOOD: NEGATIVE mg/dL
GLUCOSE: >1000 mg/dL — AB
KETONES: NEGATIVE mg/dL
LEUKOCYTES: NEGATIVE WBCs/uL
NITRITE: NEGATIVE
PH: 6 (ref 5.0–9.0)
PROTEIN: NEGATIVE mg/dL
SPECIFIC GRAVITY: 1.037 — ABNORMAL HIGH (ref 1.002–1.030)
UROBILINOGEN: NORMAL mg/dL

## 2022-02-15 LAB — CBC WITH DIFF
BASOPHIL #: 0.1 10*3/uL (ref 0.00–0.10)
BASOPHIL %: 1 % (ref 0–1)
EOSINOPHIL #: 0.1 10*3/uL (ref 0.00–0.50)
EOSINOPHIL %: 1 %
HCT: 41 % (ref 31.2–41.9)
HGB: 13.9 g/dL (ref 10.9–14.3)
LYMPHOCYTE #: 2.4 10*3/uL (ref 1.00–3.00)
LYMPHOCYTE %: 29 % (ref 16–44)
MCH: 28.5 pg (ref 24.7–32.8)
MCHC: 33.8 g/dL (ref 32.3–35.6)
MCV: 84.2 fL (ref 75.5–95.3)
MONOCYTE #: 0.5 10*3/uL (ref 0.30–1.00)
MONOCYTE %: 5 % (ref 5–13)
MPV: 10 fL (ref 7.9–10.8)
NEUTROPHIL #: 5.3 10*3/uL (ref 1.85–7.80)
NEUTROPHIL %: 63 % (ref 43–77)
PLATELETS: 210 10*3/uL (ref 140–440)
RBC: 4.87 10*6/uL (ref 3.63–4.92)
RDW: 15.9 % (ref 12.3–17.7)
WBC: 8.4 10*3/uL (ref 3.8–11.8)

## 2022-02-15 LAB — COMPREHENSIVE METABOLIC PANEL, NON-FASTING
ALBUMIN/GLOBULIN RATIO: 1.1 (ref 0.8–1.4)
ALBUMIN: 3.8 g/dL (ref 3.5–5.7)
ALKALINE PHOSPHATASE: 88 U/L (ref 34–104)
ALT (SGPT): 24 U/L (ref 7–52)
ANION GAP: 9 mmol/L (ref 4–13)
AST (SGOT): 19 U/L (ref 13–39)
BILIRUBIN TOTAL: 0.4 mg/dL (ref 0.3–1.2)
BUN/CREA RATIO: 15 (ref 6–22)
BUN: 11 mg/dL (ref 7–25)
CALCIUM, CORRECTED: 9.4 mg/dL (ref 8.9–10.8)
CALCIUM: 9.2 mg/dL (ref 8.6–10.3)
CHLORIDE: 101 mmol/L (ref 98–107)
CO2 TOTAL: 24 mmol/L (ref 21–31)
CREATININE: 0.71 mg/dL (ref 0.60–1.30)
ESTIMATED GFR: 106 mL/min/{1.73_m2} (ref >59)
GLOBULIN: 3.4 (ref 2.9–5.4)
GLUCOSE: 289 mg/dL — ABNORMAL HIGH (ref 74–109)
OSMOLALITY, CALCULATED: 278 mOsm/kg (ref 270–290)
POTASSIUM: 4 mmol/L (ref 3.5–5.1)
PROTEIN TOTAL: 7.2 g/dL (ref 6.4–8.9)
SODIUM: 134 mmol/L — ABNORMAL LOW (ref 136–145)

## 2022-02-15 LAB — URINE DRUG SCREEN
AMPHET QL: NEGATIVE
BARB QL: NEGATIVE
BENZO QL: NEGATIVE
BUP QL: NEGATIVE
CANNAQL: POSITIVE — AB
COCQL: NEGATIVE
FENTANYL, RANDOM URINE: NEGATIVE
OPIATE: NEGATIVE
OXYCODONE URINE: NEGATIVE
PCP QL: NEGATIVE

## 2022-02-15 LAB — SEDIMENTATION RATE: ERYTHROCYTE SEDIMENTATION RATE (ESR): 23 mm/hr — ABNORMAL HIGH (ref <20)

## 2022-02-15 LAB — URINALYSIS, MICROSCOPIC
RBCS: <1 /hpf (ref <4)
SQUAMOUS EPITHELIAL: <1 /hpf (ref <28)
WBCS: <1 /hpf (ref <6)

## 2022-02-15 LAB — HCG, SERUM QUALITATIVE, PREGNANCY: PREGNANCY, SERUM QUALITATIVE: NEGATIVE

## 2022-02-15 LAB — C-REACTIVE PROTEIN (CRP): C-REACTIVE PROTEIN (CRP): 0.5 mg/dL (ref 0.1–0.5)

## 2022-02-15 LAB — ETHANOL, SERUM/PLASMA: ETHANOL: <10 mg/dL — ABNORMAL HIGH

## 2022-02-15 LAB — THYROID STIMULATING HORMONE WITH FREE T4 REFLEX: TSH: 25.609 u[IU]/mL — ABNORMAL HIGH (ref 0.450–5.330)

## 2022-02-15 LAB — THYROXINE, FREE (FREE T4): THYROXINE (T4), FREE: 0.59 ng/dL (ref 0.58–1.64)

## 2022-02-15 LAB — HGA1C (HEMOGLOBIN A1C WITH EST AVG GLUCOSE): HEMOGLOBIN A1C: 13.1 % — ABNORMAL HIGH (ref 4.0–6.0)

## 2022-02-15 MED ORDER — GABAPENTIN 300 MG CAPSULE
ORAL_CAPSULE | ORAL | Status: AC
Start: 2022-02-15 — End: 2022-02-15
  Filled 2022-02-15: qty 1

## 2022-02-15 MED ORDER — HYDROMORPHONE 2 MG/ML INJECTION WRAPPER
INJECTION | INTRAMUSCULAR | Status: AC
Start: 2022-02-15 — End: 2022-02-15
  Filled 2022-02-15: qty 1

## 2022-02-15 MED ORDER — HYDROMORPHONE 2 MG/ML INJECTION WRAPPER
1.0000 mg | INJECTION | INTRAMUSCULAR | Status: AC
Start: 2022-02-15 — End: 2022-02-15
  Administered 2022-02-15: 1 mg via INTRAVENOUS

## 2022-02-15 MED ORDER — LEVOTHYROXINE 125 MCG TABLET
125.0000 ug | ORAL_TABLET | Freq: Every morning | ORAL | 0 refills | Status: DC
Start: 2022-02-15 — End: 2023-06-22

## 2022-02-15 MED ORDER — GABAPENTIN 300 MG CAPSULE
900.0000 mg | ORAL_CAPSULE | Freq: Two times a day (BID) | ORAL | 0 refills | Status: DC
Start: 2022-02-15 — End: 2023-06-22

## 2022-02-15 MED ORDER — DIPHENHYDRAMINE 50 MG/ML INJECTION SOLUTION
25.0000 mg | Freq: Once | INTRAMUSCULAR | Status: AC
Start: 2022-02-15 — End: 2022-02-15
  Administered 2022-02-15: 25 mg via INTRAVENOUS

## 2022-02-15 MED ORDER — DIPHENHYDRAMINE 50 MG/ML INJECTION SOLUTION
INTRAMUSCULAR | Status: AC
Start: 2022-02-15 — End: 2022-02-15
  Filled 2022-02-15: qty 1

## 2022-02-15 MED ORDER — GABAPENTIN 300 MG CAPSULE
300.0000 mg | ORAL_CAPSULE | ORAL | Status: AC
Start: 2022-02-15 — End: 2022-02-15
  Administered 2022-02-15: 300 mg via ORAL

## 2022-02-15 NOTE — ED Triage Notes (Signed)
Sent by [facility] - call for patient screaming and having mental breakdown. States that it hurts to have clothes on and was taking them off at clinic. Pt reports shocking feeling in legs and feet. Hx of neuropathy and fibromyalgia    [facility]: BLS transport, BS 394

## 2022-02-15 NOTE — Discharge Instructions (Addendum)
We are increasing her Synthroid from 88 mcg per day to 125 mcg per day.  Follow up with your primary care doctor in less than a month to recheck your levels.  I have written for 3 days' worth of your Neurontin.  You will have to ask your primary care doctor to get a longer term prescription.
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
## 2022-03-08 ENCOUNTER — Emergency Department (HOSPITAL_BASED_OUTPATIENT_CLINIC_OR_DEPARTMENT_OTHER)

## 2022-03-08 ENCOUNTER — Emergency Department
Admission: EM | Admit: 2022-03-08 | Discharge: 2022-03-08 | Disposition: A | Discharge status: Home / Self Care | Attending: FAMILY PRACTICE | Admitting: FAMILY PRACTICE

## 2022-03-08 ENCOUNTER — Other Ambulatory Visit: Payer: Self-pay

## 2022-03-08 ENCOUNTER — Encounter (HOSPITAL_BASED_OUTPATIENT_CLINIC_OR_DEPARTMENT_OTHER): Payer: Self-pay

## 2022-03-08 ENCOUNTER — Telehealth (HOSPITAL_BASED_OUTPATIENT_CLINIC_OR_DEPARTMENT_OTHER): Payer: Self-pay

## 2022-03-08 DIAGNOSIS — E1169 Type 2 diabetes mellitus with other specified complication: Secondary | ICD-10-CM | POA: Insufficient documentation

## 2022-03-08 DIAGNOSIS — J4489 Other specified chronic obstructive pulmonary disease: Secondary | ICD-10-CM | POA: Insufficient documentation

## 2022-03-08 DIAGNOSIS — E119 Type 2 diabetes mellitus without complications: Secondary | ICD-10-CM | POA: Insufficient documentation

## 2022-03-08 DIAGNOSIS — J449 Chronic obstructive pulmonary disease, unspecified: Secondary | ICD-10-CM

## 2022-03-08 DIAGNOSIS — R0982 Postnasal drip: Secondary | ICD-10-CM | POA: Insufficient documentation

## 2022-03-08 DIAGNOSIS — I10 Essential (primary) hypertension: Secondary | ICD-10-CM | POA: Insufficient documentation

## 2022-03-08 DIAGNOSIS — J069 Acute upper respiratory infection, unspecified: Secondary | ICD-10-CM | POA: Insufficient documentation

## 2022-03-08 DIAGNOSIS — Z1152 Encounter for screening for COVID-19: Secondary | ICD-10-CM | POA: Insufficient documentation

## 2022-03-08 DIAGNOSIS — R058 Other specified cough: Secondary | ICD-10-CM | POA: Insufficient documentation

## 2022-03-08 DIAGNOSIS — Z794 Long term (current) use of insulin: Secondary | ICD-10-CM | POA: Insufficient documentation

## 2022-03-08 DIAGNOSIS — J329 Chronic sinusitis, unspecified: Secondary | ICD-10-CM

## 2022-03-08 LAB — COMPREHENSIVE METABOLIC PANEL, NON-FASTING
ALBUMIN/GLOBULIN RATIO: 0.9 (ref 0.8–1.4)
ALBUMIN: 3.4 g/dL (ref 3.4–5.0)
ALKALINE PHOSPHATASE: 94 U/L (ref 46–116)
ALT (SGPT): 51 U/L (ref <=78)
ANION GAP: 6 mmol/L (ref 4–13)
AST (SGOT): 64 U/L — ABNORMAL HIGH (ref 15–37)
BILIRUBIN TOTAL: 0.7 mg/dL (ref 0.2–1.0)
BUN/CREA RATIO: 9
BUN: 8 mg/dL (ref 7–18)
CALCIUM, CORRECTED: 9 mg/dL
CALCIUM: 8.4 mg/dL — ABNORMAL LOW (ref 8.5–10.1)
CHLORIDE: 100 mmol/L (ref 98–107)
CO2 TOTAL: 31 mmol/L (ref 21–32)
CREATININE: 0.86 mg/dL (ref 0.55–1.02)
ESTIMATED GFR: 84 mL/min/{1.73_m2} (ref >59)
GLOBULIN: 3.7
GLUCOSE: 331 mg/dL — ABNORMAL HIGH (ref 74–106)
OSMOLALITY, CALCULATED: 285 mOsm/kg (ref 270–290)
POTASSIUM: 3.8 mmol/L (ref 3.5–5.1)
PROTEIN TOTAL: 7.1 g/dL (ref 6.4–8.2)
SODIUM: 137 mmol/L (ref 136–145)

## 2022-03-08 LAB — URINALYSIS, MACRO/MICRO
BILIRUBIN: NEGATIVE mg/dL
BLOOD: NEGATIVE mg/dL
GLUCOSE: >=1000 mg/dL — AB
KETONES: NEGATIVE mg/dL
LEUKOCYTES: NEGATIVE WBCs/uL
NITRITE: NEGATIVE
PH: 6.5 (ref 4.6–8.0)
PROTEIN: 30 mg/dL — AB
SPECIFIC GRAVITY: 1.02 (ref 1.003–1.035)
UROBILINOGEN: 0.2 mg/dL (ref 0.2–1.0)

## 2022-03-08 LAB — URINALYSIS, MICROSCOPIC

## 2022-03-08 LAB — CBC WITH DIFF
BASOPHIL #: 0.03 10*3/uL (ref 0.00–0.30)
BASOPHIL %: 0 % (ref 0–3)
EOSINOPHIL #: 0.16 10*3/uL (ref 0.00–0.80)
EOSINOPHIL %: 2 % (ref 0–7)
HCT: 44.4 % (ref 37.0–47.0)
HGB: 14.6 g/dL (ref 12.5–16.0)
LYMPHOCYTE #: 1.9 10*3/uL (ref 1.10–5.00)
LYMPHOCYTE %: 25 % (ref 25–45)
MCH: 28.3 pg (ref 27.0–32.0)
MCHC: 32.8 g/dL (ref 32.0–36.0)
MCV: 86.3 fL (ref 78.0–99.0)
MONOCYTE #: 0.48 10*3/uL (ref 0.00–1.30)
MONOCYTE %: 6 % (ref 0–12)
MPV: 9.6 fL (ref 7.4–10.4)
NEUTROPHIL #: 4.89 10*3/uL (ref 1.80–8.40)
NEUTROPHIL %: 66 % (ref 40–76)
PLATELETS: 281 10*3/uL (ref 140–440)
RBC: 5.15 10*6/uL (ref 4.20–5.40)
RDW: 16.9 % — ABNORMAL HIGH (ref 11.6–14.8)
WBC: 7.5 10*3/uL (ref 4.0–10.5)

## 2022-03-08 LAB — NT-PROBNP: NT-PROBNP: 48 pg/mL (ref <=125)

## 2022-03-08 LAB — MAGNESIUM: MAGNESIUM: 1.8 mg/dL (ref 1.8–2.4)

## 2022-03-08 LAB — COVID-19, FLU A/B, RSV RAPID BY PCR
INFLUENZA VIRUS TYPE A: NOT DETECTED
INFLUENZA VIRUS TYPE B: NOT DETECTED
RESPIRATORY SYNCTIAL VIRUS (RSV): NOT DETECTED
SARS-CoV-2: NOT DETECTED

## 2022-03-08 LAB — LACTIC ACID LEVEL W/ REFLEX FOR LEVEL >2.0: LACTIC ACID: 2.1 mmol/L — ABNORMAL HIGH (ref 0.4–2.0)

## 2022-03-08 LAB — TROPONIN-I: TROPONIN I: <2 ng/L (ref <15)

## 2022-03-08 LAB — HCG, SERUM QUALITATIVE, PREGNANCY: PREGNANCY, SERUM QUALITATIVE: NEGATIVE

## 2022-03-08 MED ORDER — ALBUTEROL SULFATE 2.5 MG/3 ML (0.083 %) SOLUTION FOR NEBULIZATION
INHALATION_SOLUTION | RESPIRATORY_TRACT | Status: AC
Start: 2022-03-08 — End: 2022-03-08
  Filled 2022-03-08: qty 3

## 2022-03-08 MED ORDER — INSULIN LISPRO 100 UNIT/ML SUB-Q - CHARGE BY DOSE
SUBCUTANEOUS | Status: AC
Start: 2022-03-08 — End: 2022-03-08
  Filled 2022-03-08: qty 3

## 2022-03-08 MED ORDER — ALBUTEROL SULFATE 2.5 MG/3 ML (0.083 %) SOLUTION FOR NEBULIZATION
2.5000 mg | INHALATION_SOLUTION | RESPIRATORY_TRACT | Status: AC
Start: 2022-03-08 — End: 2022-03-08
  Administered 2022-03-08: 2.5 mg via RESPIRATORY_TRACT

## 2022-03-08 MED ORDER — INSULIN ASPART (U-100) 100 UNIT/ML (3 ML) SUBCUTANEOUS PEN
8.0000 [IU] | PEN_INJECTOR | Freq: Three times a day (TID) | SUBCUTANEOUS | 0 refills | Status: DC
Start: 2022-03-08 — End: 2023-06-22

## 2022-03-08 MED ORDER — INSULIN LISPRO 100 UNIT/ML SUB-Q - CHARGE BY DOSE
3.0000 [IU] | Freq: Once | SUBCUTANEOUS | Status: AC
Start: 2022-03-08 — End: 2022-03-08
  Administered 2022-03-08: 3 [IU] via SUBCUTANEOUS

## 2022-03-08 NOTE — ED Provider Notes (Signed)
[facility], [facility] Emergency Department  ED Primary Provider Note  History of Present Illness   Chief Complaint   Patient presents with    Dizziness     Jennifer [name] is a 46 y.o. female who had concerns including Dizziness.  Arrival: The patient arrived by Ambulance    This 46 year old female patient presents emergency department with complaints of cough, congestion, chest wall pain and productive thick green sputum.  She states she has had some fever and chills but no sweats.  She has diabetes mellitus type 2, is on Levemir but has been out of her NovoLog for several months.  She did get her Levemir, and Januvia refill is but no NovoLog.  She states her most recent A1c was 13, previously before running out of NovoLog her A1c was about 8.  She does smoke marijuana denies tobacco, vaping, alcohol or illicit drug use.  Past medical history significant for diabetes, hypertension, asthma, and COPD.    Patient has been [place] vaccinated with 1 booster, has not had COVID that she is aware of.      Review of Systems   Pertinent positive and negative ROS as per HPI.  Historical Data   History Reviewed This Encounter:  Patient's past medical, surgical, social history reviewed noted.    Physical Exam   ED Triage Vitals [03/08/22 1038]   BP (Non-Invasive) (!) 159/88   Heart Rate 88   Respiratory Rate 17   Temperature 36.7 C (98 F)   SpO2 95 %   Weight 95.3 kg (210 lb)   Height 1.676 m ('5\' 6"' )     Physical Exam  General: No acute distress, nontoxic  Ears:  TMs clear without retraction  Nasal:  Injected mucosa, thick green mucus noted in both passages, patent bilaterally   Oral:  Pink moist oropharynx: Injected without PND, exudate, petechiae   Neck:  Supple without accessory muscle use to breathe, no lymphadenopathy.    Lungs:  Clear symmetrical bilaterally with fair to good aeration.    Heart: Regular rate rhythm normal S1-S2 without murmur gallop   Abdomen:  Soft normal bowel sounds  nontender   Extremities:  Moving symmetrically   Neurological:  No focal deficits.      Patient Data     Labs Ordered/Reviewed   COMPREHENSIVE METABOLIC PANEL, NON-FASTING - Abnormal; Notable for the following components:       Result Value    CALCIUM 8.4 (*)     GLUCOSE 331 (*)     AST (SGOT) 64 (*)     All other components within normal limits    Narrative:     Estimated Glomerular Filtration Rate (eGFR) is calculated using the CKD-EPI (2021) equation, intended for patients 32 years of age and older. If gender is not documented or "unknown", there will be no eGFR calculation.   LACTIC ACID LEVEL W/ REFLEX FOR LEVEL >2.0 - Abnormal; Notable for the following components:    LACTIC ACID 2.1 (*)     All other components within normal limits   CBC WITH DIFF - Abnormal; Notable for the following components:    RDW 16.9 (*)     All other components within normal limits   URINALYSIS, MACRO/MICRO - Abnormal; Notable for the following components:    PROTEIN 30 (*)     GLUCOSE >=1000 (*)     All other components within normal limits   URINALYSIS, MICROSCOPIC - Abnormal; Notable for the following components:  BACTERIA Rare (*)     All other components within normal limits   NT-PROBNP - Normal   COVID-19, FLU A/B, RSV RAPID BY PCR - Normal    Narrative:     Results are for the simultaneous qualitative identification of SARS-CoV-2 (formerly 2019-nCoV), Influenza A, Influenza B, and RSV RNA. These etiologic agents are generally detectable in nasopharyngeal and nasal swabs during the ACUTE PHASE of infection. Hence, this test is intended to be performed on respiratory specimens collected from individuals with signs and symptoms of upper respiratory tract infection who meet Centers for Disease Control and Prevention (CDC) clinical and/or epidemiological criteria for Coronavirus Disease 2019 (COVID-19) testing. CDC COVID-19 criteria for testing on human specimens is available at [facility] webpage information for Healthcare  Professionals: Coronavirus Disease 2019 (COVID-19) ([email or web address]).     False-negative results may occur if the virus has genomic mutations, insertions, deletions, or rearrangements or if performed very early in the course of illness. Otherwise, negative results indicate virus specific RNA targets are not detected, however negative results do not preclude SARS-CoV-2 infection/COVID-19, Influenza, or Respiratory syncytial virus infection. Results should not be used as the sole basis for patient management decisions. Negative results must be combined with clinical observations, patient history, and epidemiological information. If upper respiratory tract infection is still suspected based on exposure history together with other clinical findings, re-testing should be considered.    Disclaimer:   This assay has been authorized by FDA under an Emergency Use Authorization for use in laboratories certified under the Clinical Laboratory Improvement Amendments of 1988 (CLIA), 42 U.S.C. 386-800-0026, to perform high complexity tests. The impacts of vaccines, antiviral therapeutics, antibiotics, chemotherapeutic or immunosuppressant drugs have not been evaluated.     Test methodology:   Cepheid Xpert Xpress SARS-CoV-2/Flu/RSV Assay real-time polymerase chain reaction (RT-PCR) test on the GeneXpert Dx and Xpert Xpress systems.   TROPONIN-I - Normal    Narrative:     Values received on females ranging between 12-15 ng/L MUST include the next serial troponin to review changes in the delta differences as the reference range for the Access II chemistry analyzer is lower than the established reference range.     HCG, SERUM QUALITATIVE, PREGNANCY - Normal   MAGNESIUM - Normal   RESPIRATORY CULTURE AND GRAM STAIN, AEROBIC    Narrative:     The following orders were created for panel order [place].  Procedure                               Abnormality          Status                     ---------                               -----------         ------                     SPUTUM URKYHC[[id number]]  Please view results for these tests on the individual orders.   SPUTUM SCREEN   CBC/DIFF    Narrative:     The following orders were created for panel order CBC/DIFF.  Procedure                               Abnormality         Status                     ---------                               -----------         ------                     CBC WITH RDEY[[id number]]                Abnormal            Final result                 Please view results for these tests on the individual orders.   URINALYSIS WITH REFLEX MICROSCOPIC AND CULTURE IF POSITIVE    Narrative:     The following orders were created for panel order URINALYSIS WITH REFLEX MICROSCOPIC AND CULTURE IF POSITIVE.  Procedure                               Abnormality         Status                     ---------                               -----------         ------                     URINALYSIS, MACRO/MICRO[[id number]]      Abnormal            Final result               URINALYSIS, MICROSCOPIC[[id number]]      Abnormal            Final result                 Please view results for these tests on the individual orders.   ARTERIAL BLOOD GAS/LACTATE   LACTIC ACID - FIRST REFLEX     XR AP MOBILE CHEST   Final Result by Edi, Radresults In (10/10 1147)   NO ACUTE FINDINGS.         Radiologist location ID: [place] Decision Making        Medical Decision Making  Patient presents with uncontrolled diabetes, history COPD and asthma with marijuana use.  With a upper respiratory complaints, and protect sputum must consider COVID, influenza, other URI, postnasal drainage, cough, COPD exacerbation, and pneumonia.  She also has uncontrolled diabetes, will refill her NovoLog.    Amount and/or Complexity of Data Reviewed  Labs: ordered.  Radiology:  ordered.  ECG/medicine tests: ordered.    Risk  Prescription drug management.  ED Course as of 03/08/22 1356   Tue Mar 08, 2022   1319 Patient's labs were reviewed, 331 glucose other labs are unremarkable.  Her for plexus negative.  Chest x-ray shows chronic changes, no acute process.         Medications Administered in the ED   insulin lispro (HumaLOG) 100 units/mL injection (has no administration in time range)   albuterol (PROVENTIL) 2.5 mg / 3 mL (0.083%) neb solution (2.5 mg Nebulization Given 03/08/22 1139)     Clinical Impression   Viral URI with cough (Primary)   Purulent postnasal drainage   Type 2 diabetes mellitus with other specified complication, with long-term current use of insulin (CMS HCC)       Disposition: Discharged    .Marland KitchenBretta Bang, DO

## 2022-03-08 NOTE — Discharge Instructions (Addendum)
A refill your NovoLog today, to use as directed by your primary care provider.  Continue other home medications.  I recommend you stop all tobacco use, be that either smoking, vaping, or marijuana.  Continue current home medications as directed and prescribed.  Need to call follow-up with the primary care provider, this week if possible.  Work excuse to go back to work on Thursday.

## 2022-03-08 NOTE — Ancillary Notes (Signed)
PT REFUSED ABG.

## 2022-03-08 NOTE — ED Nurses Note (Signed)
[facility], [facility] Emergency Department  Peer Recovery Coach Assessment    Initial Evaluation  Referred by:: Nurse  Location of Evaluation: Emergency Department  How many times in the last 12 months have you been to the ED?: 6 or more  Have you ever served or are you currently serving in the [place]?: No             Substance Use History  Patient current substance use status: Patient states that she has a drinking problem and smokes marijuana to help with pain.    Prior treatment history?: No    Currently enrolled in substance use program?: No    Within the last 30 days, what substances has the patient used?: Alcohol, Marijuana  Patient's age at first substance use?: 48 or older  Drug route of administration: Oral, Smoked    Has the patient ever had sustained abstinence?: No              Family, Social, Home & Safety History  Marital Status: Single            Need to improve relationships with family?: No    Social network: Non-substance using peers/friends/other, Substance using peers    Current living situation: Independent  Any help needed with the following?: None  Contact phone number for the patient: [phone]       Has the patient had any legal issues within the past 30 days?: None         Employment  Current employment status: Disabled    Landscape architect?: No  Needs assistance with job search?: No    Engagement  Readiness ruler: 8  Summary of assessment priority areas: Substance abuse treatment, Social, Mental Health  Summary of assessment priority areas: comments: Patient is aware that she needs change in her life and is willing to make those changes.    Brief Intervention  Discussed plan to reduce/quit substance use?: Yes  Discussed willingness to enter treatment?: Yes  Indicated patient's stage of change:: 3 - Preparation    Patient seen by Peer Recovery Coach and is a candidate for buprenorphine administration in the ED. Patient needs assessment for  bup treatment.: No    Plan  Was the patient referred to treatment?: No    Was patient referred to physician for Buprenorphine Assessment in the ED?: No    Did patient receive Narcan in the ED?: No    Plan: Additional Comments: Patient does not want to enter treatment but is wanting to attend AA meetings.    Follow-up  Patient admitted for treatment?: No        Need for additional follow-up?: Yes  Additional comments: Patient needs Follow-up to have encouragment and understanding. Patient can benefit from Follow-Ups.    Rossie Muskrat, Peer Recovery Coach 03/08/2022 11:21

## 2022-03-08 NOTE — ED Nurses Note (Signed)
Patient discharged home with family.  AVS reviewed with patient/care giver.  A written copy of the AVS and discharge instructions was given to the patient/care giver. Script escribed to preferred pharmacy. Questions sufficiently answered as needed.  Patient/care giver encouraged to follow up with PCP as indicated.  In the event of an emergency, patient/care giver instructed to call 911 or go to the nearest emergency room.

## 2022-03-08 NOTE — ED Triage Notes (Signed)
Pt states she started getting dizzy this am. Blood sugar  243. Has been coughing and congested.

## 2022-03-09 DIAGNOSIS — R42 Dizziness and giddiness: Secondary | ICD-10-CM

## 2022-03-09 DIAGNOSIS — R0789 Other chest pain: Secondary | ICD-10-CM

## 2022-03-09 LAB — ECG 12 LEAD
Atrial Rate: 77 {beats}/min
Calculated P Axis: 67 degrees
Calculated R Axis: -2 degrees
Calculated T Axis: 16 degrees
PR Interval: 160 ms
QRS Duration: 78 ms
QT Interval: 414 ms
QTC Calculation: 468 ms
Ventricular rate: 77 {beats}/min

## 2022-04-18 ENCOUNTER — Other Ambulatory Visit: Payer: Self-pay

## 2022-04-18 ENCOUNTER — Ambulatory Visit: Attending: INTERNAL MEDICINE-ENDOCRINOLOGY-DIABETES AND METABOLISM

## 2022-04-18 DIAGNOSIS — E1129 Type 2 diabetes mellitus with other diabetic kidney complication: Secondary | ICD-10-CM | POA: Insufficient documentation

## 2022-04-18 DIAGNOSIS — E1165 Type 2 diabetes mellitus with hyperglycemia: Secondary | ICD-10-CM | POA: Insufficient documentation

## 2022-04-18 DIAGNOSIS — R5383 Other fatigue: Secondary | ICD-10-CM | POA: Insufficient documentation

## 2022-04-18 DIAGNOSIS — I519 Heart disease, unspecified: Secondary | ICD-10-CM | POA: Insufficient documentation

## 2022-04-18 DIAGNOSIS — E039 Hypothyroidism, unspecified: Secondary | ICD-10-CM | POA: Insufficient documentation

## 2022-04-18 LAB — BASIC METABOLIC PANEL
ANION GAP: 4 mmol/L (ref 4–13)
BUN/CREA RATIO: 18 (ref 6–22)
BUN: 14 mg/dL (ref 7–25)
CALCIUM: 9 mg/dL (ref 8.6–10.3)
CHLORIDE: 104 mmol/L (ref 98–107)
CO2 TOTAL: 27 mmol/L (ref 21–31)
CREATININE: 0.77 mg/dL (ref 0.60–1.30)
ESTIMATED GFR: 96 mL/min/{1.73_m2} (ref >59)
GLUCOSE: 123 mg/dL — ABNORMAL HIGH (ref 74–109)
OSMOLALITY, CALCULATED: 272 mOsm/kg (ref 270–290)
POTASSIUM: 4.1 mmol/L (ref 3.5–5.1)
SODIUM: 135 mmol/L — ABNORMAL LOW (ref 136–145)

## 2022-04-18 LAB — MICROALBUMIN URINE, RANDOM: MICROALBUMIN RANDOM URINE: 28.4 mg/dL

## 2022-04-18 LAB — THYROID STIMULATING HORMONE (SENSITIVE TSH): TSH: 8.607 u[IU]/mL — ABNORMAL HIGH (ref 0.450–5.330)

## 2022-04-18 LAB — HGA1C (HEMOGLOBIN A1C WITH EST AVG GLUCOSE): HEMOGLOBIN A1C: 8.4 % — ABNORMAL HIGH (ref 4.0–6.0)

## 2022-04-19 LAB — C-PEPTIDE: C-PEPTIDE: 2.3 ng/mL (ref 1.4–6.8)

## 2022-06-16 ENCOUNTER — Emergency Department
Admission: EM | Admit: 2022-06-16 | Discharge: 2022-06-16 | Disposition: A | Discharge status: Home / Self Care | Attending: FAMILY PRACTICE | Admitting: FAMILY PRACTICE

## 2022-06-16 ENCOUNTER — Other Ambulatory Visit: Payer: Self-pay

## 2022-06-16 ENCOUNTER — Encounter (HOSPITAL_BASED_OUTPATIENT_CLINIC_OR_DEPARTMENT_OTHER): Payer: Self-pay

## 2022-06-16 ENCOUNTER — Emergency Department (HOSPITAL_BASED_OUTPATIENT_CLINIC_OR_DEPARTMENT_OTHER)

## 2022-06-16 DIAGNOSIS — J069 Acute upper respiratory infection, unspecified: Secondary | ICD-10-CM

## 2022-06-16 DIAGNOSIS — J4489 Other specified chronic obstructive pulmonary disease: Secondary | ICD-10-CM | POA: Insufficient documentation

## 2022-06-16 DIAGNOSIS — E039 Hypothyroidism, unspecified: Secondary | ICD-10-CM | POA: Insufficient documentation

## 2022-06-16 DIAGNOSIS — J449 Chronic obstructive pulmonary disease, unspecified: Secondary | ICD-10-CM

## 2022-06-16 DIAGNOSIS — R0981 Nasal congestion: Secondary | ICD-10-CM | POA: Insufficient documentation

## 2022-06-16 DIAGNOSIS — E119 Type 2 diabetes mellitus without complications: Secondary | ICD-10-CM | POA: Insufficient documentation

## 2022-06-16 DIAGNOSIS — I10 Essential (primary) hypertension: Secondary | ICD-10-CM | POA: Insufficient documentation

## 2022-06-16 DIAGNOSIS — R0602 Shortness of breath: Secondary | ICD-10-CM | POA: Insufficient documentation

## 2022-06-16 DIAGNOSIS — R059 Cough, unspecified: Secondary | ICD-10-CM | POA: Insufficient documentation

## 2022-06-16 DIAGNOSIS — U071 COVID-19: Secondary | ICD-10-CM | POA: Insufficient documentation

## 2022-06-16 DIAGNOSIS — R0982 Postnasal drip: Secondary | ICD-10-CM | POA: Insufficient documentation

## 2022-06-16 MED ORDER — ONDANSETRON 4 MG DISINTEGRATING TABLET
4.0000 mg | ORAL_TABLET | Freq: Three times a day (TID) | ORAL | 0 refills | Status: DC | PRN
Start: 2022-06-16 — End: 2022-09-05

## 2022-06-16 NOTE — ED Nurses Note (Signed)
Pt DC home ambulatory. Prescription x 1 e-scribed Pt to take B/P medication upon arrival at home. Verbal and Written instructions given. Pt voices understanding.

## 2022-06-16 NOTE — ED Nurses Note (Signed)
Pt states, "I  have COVID and I feel so bad. I'm nauseated. I can hardly drink anything. I am making my self takes sips of gator aid  zero. I feel short of breath. I am coughing but I can't get anything up. It seems like it's stuck in my throat. I'm out of of my breathing tx medication. " Respirations even and unlabored. Lungs clear. 02 sat 98% room air. Cardiac monitor shows SR without ectopy.

## 2022-06-16 NOTE — ED Provider Notes (Signed)
[facility], [facility] Emergency Department  ED Primary Provider Note  History of Present Illness   Chief Complaint   Patient presents with    Covid-19 Positive     Jennifer [name] is a 47 y.o. female who had concerns including Covid-19 Positive.  Arrival: The patient arrived by Ambulance    This 47 year old female patient presents to the emergency department with nasal congestion postnasal drip, headache and body aches for the past week.  Symptoms started at their worst on Sunday, is ongoing currently.  She did have a test at the [facility] yesterday that was positive for COVID.  She was at minimum day 4 of her COVID.  She does complain of shortness of breath but she has significant nasal congestion, postnasal drip with cough.  She states her cough is worse in the mornings when she gets up.  She has a history of diabetes mellitus type 2, hypertension, COPD, hypothyroidism and asthma.  Past surgical history of tubal ligation cholecystectomy.  She quit smoking several months ago denies any marijuana, vaping, alcohol or illicit drug use.    Patient is COVID vaccinated with the Pfizer vaccine 1 booster.  This is the 1st time having COVID.  She was not had do influenza vaccine for this season.      History Reviewed This Encounter:  Patient's past medical, surgical, social history reviewed noted.    Physical Exam   ED Triage Vitals [06/16/22 0657]   BP (Non-Invasive) (!) 133/99   Heart Rate 92   Respiratory Rate 16   Temperature 37.2 C (99 F)   SpO2 98 %   Weight 96.6 kg (213 lb)   Height 1.676 m (5\' 6" )     Physical Exam  General:  No acute distress, nontoxic.  She does have a nasal tone to her voice.    Ears:  TMs are clear without fluid or retraction   Nasal:  Boggy mucosa, thick white mucus bilaterally, patent   Oral:  Pink and moist   Pharynx:  Injected, small amount of white PND.  No exudate or petechiae.    Neck:  Supple, no adenopathy trachea is in the midline.    Lungs:  Clear symmetrical with good aeration   Heart:  Regular rate rhythm S1-S2 without murmur or gallop   Abdomen:  Soft normal bowel sounds nontender   Extremities:  Moving symmetrically      Patient Data   Labs Ordered/Reviewed - No data to display  No orders to display     Medical Decision Making        Medical Decision Making  Moderate complexity straightforward COVID, day 4 of symptoms.  We will continue symptom care, follow up primary care provider if needed.    Amount and/or Complexity of Data Reviewed  Discussion of management or test interpretation with external provider(s): COVID from previous diagnosis from [facility]    Risk  OTC drugs.                Clinical Impression   Viral URI with cough (Primary)   COVID   Chronic obstructive pulmonary disease, unspecified COPD type (CMS HCC)       Disposition: Discharged       .Marland KitchenBretta Bang, DO

## 2022-06-16 NOTE — Discharge Instructions (Addendum)
With your COVID diagnosis and your in day 4 symptoms your primary care provider put your off through Monday, follow his advice on your sick leave.  You need a cool vapor humidifier in your house running 24 hours a day to keep your humidity up in the 40-50% range.  You can also use nasal saline spray in each nostril multiple times a day to thin the thick mucus and help you clear nasal passages.  For symptom relief of the congestion you can use Tylenol cold and flu or Advil cold and flu because of the antihistamine, decongestant and aches and pain medications in the medicines.  Continue home medications, check your sugar frequently while ill because stresses and illness can raise your sugar.    Monitoring here in emergency department shows that your oxygen levels have been in the 95-98% range the entire time you have been in the emergency department, with a nasal quality to your voice I think that the shortness of breath is just severe nasal blockage from mucus.  You above-noted symptom care should improve this and symptoms of this viral illness should continue to improve and resolve over the next 5-7 days.    Call follow-up with your primary care provider if needed.

## 2022-06-16 NOTE — ED Triage Notes (Addendum)
Ems reports pt Covid + has had shortness of breath and cough for last week. 20 ga left hand per ems. Hx COPD, initial O2 sat 90% room air per ems

## 2022-06-17 NOTE — ED Nurses Note (Signed)
[facility], [facility] Emergency Department  Peer Recovery Coach Assessment    Initial Evaluation  Referred by:: Nurse  Location of Evaluation: Emergency Department Via Phone  How many times in the last 12 months have you been to the ED?: 6 or more  Have you ever served or are you currently serving in the [place]?: No             Substance Use History  Patient current substance use status: Patient states that she smokes marijuana to help with chronic pain.    Prior treatment history?: No    Currently enrolled in substance use program?: No    Within the last 30 days, what substances has the patient used?: Marijuana  Patient's age at first substance use?: 37 or older  Drug route of administration: Smoked    Has the patient ever had sustained abstinence?: No              Family, Social, Home & Safety History  Marital Status: Single            Need to improve relationships with family?: No    Social network: Immediate family, Substance using peers, Non-substance using peers/friends/other    Current living situation: Independent  Any help needed with the following?: None  Contact phone number for the patient: [phone]       Has the patient had any legal issues within the past 30 days?: None         Employment  Current employment status: Employed  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 Patient needs assessment for bup treatment.: No    Plan  Was the patient referred to treatment?:  No    Was patient referred to physician for Buprenorphine Assessment in the ED?: No    Did patient receive Narcan in the ED?: No    Plan: Additional Comments: Discussed long term effects of marijuana and resources provided on medical card.    Follow-up  Patient admitted for treatment?: No        Need for additional follow-up?: No  Additional comments: Patient states that she smokes marijuana for chronic pain. Patient does not see anything wrong with current use. Discussed long term effects of marijuana and resources provided on medical card.    Callie Fielding, Peer Recovery Coach 06/17/2022 12:07

## 2022-09-05 ENCOUNTER — Emergency Department
Admission: EM | Admit: 2022-09-05 | Discharge: 2022-09-05 | Disposition: A | Discharge status: Home / Self Care | Attending: NURSE PRACTITIONER | Admitting: NURSE PRACTITIONER

## 2022-09-05 ENCOUNTER — Encounter (HOSPITAL_BASED_OUTPATIENT_CLINIC_OR_DEPARTMENT_OTHER): Payer: Self-pay

## 2022-09-05 ENCOUNTER — Emergency Department (EMERGENCY_DEPARTMENT_HOSPITAL)

## 2022-09-05 ENCOUNTER — Other Ambulatory Visit: Payer: Self-pay

## 2022-09-05 DIAGNOSIS — K2970 Gastritis, unspecified, without bleeding: Secondary | ICD-10-CM | POA: Insufficient documentation

## 2022-09-05 DIAGNOSIS — R112 Nausea with vomiting, unspecified: Secondary | ICD-10-CM

## 2022-09-05 DIAGNOSIS — B349 Viral infection, unspecified: Secondary | ICD-10-CM | POA: Insufficient documentation

## 2022-09-05 DIAGNOSIS — Z1152 Encounter for screening for COVID-19: Secondary | ICD-10-CM | POA: Insufficient documentation

## 2022-09-05 DIAGNOSIS — A0811 Acute gastroenteropathy due to Norwalk agent: Secondary | ICD-10-CM

## 2022-09-05 LAB — URINALYSIS, MICROSCOPIC

## 2022-09-05 LAB — COMPREHENSIVE METABOLIC PANEL, NON-FASTING
ALBUMIN/GLOBULIN RATIO: 1 (ref 0.8–1.4)
ALBUMIN: 3.3 g/dL — ABNORMAL LOW (ref 3.4–5.0)
ALKALINE PHOSPHATASE: 83 U/L (ref 46–116)
ALT (SGPT): 53 U/L (ref <=78)
ANION GAP: 9 mmol/L (ref 4–13)
AST (SGOT): 32 U/L (ref 15–37)
BILIRUBIN TOTAL: 0.5 mg/dL (ref 0.2–1.0)
BUN/CREA RATIO: 18
BUN: 13 mg/dL (ref 7–18)
CALCIUM, CORRECTED: 9.3 mg/dL
CALCIUM: 8.7 mg/dL (ref 8.5–10.1)
CHLORIDE: 107 mmol/L (ref 98–107)
CO2 TOTAL: 29 mmol/L (ref 21–32)
CREATININE: 0.71 mg/dL (ref 0.55–1.02)
ESTIMATED GFR: 105 mL/min/{1.73_m2} (ref >59)
GLOBULIN: 3.4
GLUCOSE: 134 mg/dL — ABNORMAL HIGH (ref 74–106)
OSMOLALITY, CALCULATED: 291 mOsm/kg — ABNORMAL HIGH (ref 270–290)
POTASSIUM: 3.9 mmol/L (ref 3.5–5.1)
PROTEIN TOTAL: 6.7 g/dL (ref 6.4–8.2)
SODIUM: 145 mmol/L (ref 136–145)

## 2022-09-05 LAB — CBC WITH DIFF
BASOPHIL #: 0.01 10*3/uL (ref 0.00–0.30)
BASOPHIL %: 0 % (ref 0–3)
EOSINOPHIL #: 0.08 10*3/uL (ref 0.00–0.80)
EOSINOPHIL %: 1 % (ref 0–7)
HCT: 37.6 % (ref 37.0–47.0)
HGB: 12.3 g/dL — ABNORMAL LOW (ref 12.5–16.0)
LYMPHOCYTE #: 1.39 10*3/uL (ref 1.10–5.00)
LYMPHOCYTE %: 23 % — ABNORMAL LOW (ref 25–45)
MCH: 29.4 pg (ref 27.0–32.0)
MCHC: 32.8 g/dL (ref 32.0–36.0)
MCV: 89.6 fL (ref 78.0–99.0)
MONOCYTE #: 0.52 10*3/uL (ref 0.00–1.30)
MONOCYTE %: 9 % (ref 0–12)
MPV: 9 fL (ref 7.4–10.4)
NEUTROPHIL #: 4.1 10*3/uL (ref 1.80–8.40)
NEUTROPHIL %: 67 % (ref 40–76)
PLATELETS: 263 10*3/uL (ref 140–440)
RBC: 4.2 10*6/uL (ref 4.20–5.40)
RDW: 15.6 % — ABNORMAL HIGH (ref 11.6–14.8)
WBC: 6.1 10*3/uL (ref 4.0–10.5)

## 2022-09-05 LAB — URINALYSIS, MACRO/MICRO
BILIRUBIN: NEGATIVE mg/dL
GLUCOSE: NEGATIVE mg/dL
KETONES: NEGATIVE mg/dL
LEUKOCYTES: NEGATIVE WBCs/uL
NITRITE: NEGATIVE
PH: 6 (ref 4.6–8.0)
PROTEIN: 100 mg/dL — AB
SPECIFIC GRAVITY: >=1.03 (ref 1.003–1.035)
UROBILINOGEN: 0.2 mg/dL (ref 0.2–1.0)

## 2022-09-05 LAB — RAPID THROAT SCREEN, STREPTOCOCCUS, WITH REFLEX: THROAT RAPID SCREEN, STREPTOCOCCUS: NEGATIVE

## 2022-09-05 LAB — COVID-19, FLU A/B, RSV RAPID BY PCR
INFLUENZA VIRUS TYPE A: NOT DETECTED
INFLUENZA VIRUS TYPE B: NOT DETECTED
RESPIRATORY SYNCTIAL VIRUS (RSV): NOT DETECTED
SARS-CoV-2: NOT DETECTED

## 2022-09-05 MED ORDER — ONDANSETRON HCL (PF) 4 MG/2 ML INJECTION SOLUTION
INTRAMUSCULAR | Status: AC
Start: 2022-09-05 — End: 2022-09-05
  Filled 2022-09-05: qty 2

## 2022-09-05 MED ORDER — DEXAMETHASONE SODIUM PHOSPHATE (PF) 10 MG/ML INJECTION SOLUTION
INTRAMUSCULAR | Status: AC
Start: 2022-09-05 — End: 2022-09-05
  Filled 2022-09-05: qty 1

## 2022-09-05 MED ORDER — ONDANSETRON 4 MG DISINTEGRATING TABLET
4.0000 mg | ORAL_TABLET | Freq: Three times a day (TID) | ORAL | 0 refills | Status: DC | PRN
Start: 2022-09-05 — End: 2023-06-22

## 2022-09-05 MED ORDER — SODIUM CHLORIDE 0.9 % IV BOLUS
1000.0000 mL | INJECTION | Status: AC
Start: 2022-09-05 — End: 2022-09-05
  Administered 2022-09-05: 1000 mL via INTRAVENOUS
  Administered 2022-09-05: 0 mL via INTRAVENOUS

## 2022-09-05 MED ORDER — FAMOTIDINE (PF) 20 MG/2 ML INTRAVENOUS SOLUTION
20.0000 mg | INTRAVENOUS | Status: AC
Start: 2022-09-05 — End: 2022-09-05
  Administered 2022-09-05: 20 mg via INTRAVENOUS

## 2022-09-05 MED ORDER — DEXAMETHASONE SODIUM PHOSPHATE (PF) 10 MG/ML INJECTION SOLUTION
8.0000 mg | INTRAMUSCULAR | Status: AC
Start: 2022-09-05 — End: 2022-09-05
  Administered 2022-09-05: 8 mg via INTRAVENOUS

## 2022-09-05 MED ORDER — ONDANSETRON HCL (PF) 4 MG/2 ML INJECTION SOLUTION
4.0000 mg | INTRAMUSCULAR | Status: AC
Start: 2022-09-05 — End: 2022-09-05
  Administered 2022-09-05: 4 mg via INTRAVENOUS

## 2022-09-05 MED ORDER — FAMOTIDINE (PF) 20 MG/2 ML INTRAVENOUS SOLUTION
INTRAVENOUS | Status: AC
Start: 2022-09-05 — End: 2022-09-05
  Filled 2022-09-05: qty 2

## 2022-09-05 NOTE — ED Nurses Note (Signed)
Patient discharged home with family/self with treatment of initial complaint upon arrival to ER. AVS reviewed by physician with patient/care giver. A written copy of the AVS and discharge instructions was given to the patient/care giver. Scripts handed to patient/care giver or sent to pharmacy indicated on AVS print out. Pt verbalized understanding of taking any medications as prescribed. Questions and concerns sufficiently answered as needed. Patient/care giver encouraged to follow up with PCP as indicated. In the event of an emergency, patient/care giver instructed to call 911 or go to the nearest emergency room. No other concerns voiced at time of discharge to physician or nurse. Respiration even and unlabored. Pt ambulatory out of ED.

## 2022-09-05 NOTE — ED Nurses Note (Signed)
Mid level in room with patient, patient stating she is having an allergic reaction to the tape placed on her arm after lab drew blood. Small reddened area noted to arm. Orders given to this nurse.

## 2022-09-05 NOTE — Discharge Instructions (Signed)
Thank you for allowing us to be part of your care.    Please discuss all medications with your pharmacist to ensure there are no concerns of interactions.    Please ensure all questions or concerns are addressed prior to leaving the hospital. We want to make sure your concerns are addressed to make sure you are as safe and healthy as possible. By leaving the hospital, it is understood you are in agreement with your treatment plan.    You may have received sedating medication during your visit. Please discuss this with your discharging provider nurse as you may not be able to operate machines while the medication is in your system, or while you are taking any potentially sedating prescriptions.    Please call the hospital medical records office for a copy of your finalized results, and review them with a primary care physician, for any findings needing further attention.    If you feel your situation worsens, or does not get better in 48 hours, please see a physician for evaluation.    We encourage you to see your regular doctor as soon as possible to let them know you were seen in the emergency department. They may want to do further testing. If you do not have a doctor, please feel free to call the hospital, and ask for contact information of accepting providers. Please also discuss your vaccinations, and ensure all are up to date.    You may use this document to take today off work or school.

## 2022-09-05 NOTE — ED Triage Notes (Addendum)
Patient c/o runny nose, abdominal pain, generalized body aches, vomiting, diarrhea. Symptoms started this morning. Vomited last approx 1 hour ago.

## 2022-09-05 NOTE — ED Provider Notes (Signed)
[facility]  ED Primary Provider Note  Patient Name: Tori MilksKeisha A Nero  Patient Age: 47 y.o.  Date of Birth: 10/14/1975    Chief Complaint: Vomiting        History of Present Illness       Tori MilksKeisha A Petrosky is a 47 y.o. female who had concerns including Vomiting.  Patient presents to ED today for general ill feeling body aches and vomiting.  Patient states she vomited once today multiple times yesterday.  Patient also reports fever and chills and some difficulty breathing.  Patient also reports shortness of breath.        Review of Systems     No other overt Review of Systems are noted to be positive except noted in the HPI.      Historical Data   History Reviewed This Encounter: Medical History  Surgical History  Family History  Social History      Physical Exam   ED Triage Vitals [09/05/22 1446]   BP (Non-Invasive) (!) 175/115   Heart Rate 100   Respiratory Rate 19   Temperature 36.8 C (98.3 F)   SpO2 98 %   Weight 95.3 kg (210 lb)   Height 1.702 m (5\' 7" )         Nursing notes reviewed for what could be assessed. Past Medical, Surgical, and Social history reviewed for what has been completed.     Constitutional: NAD. Well-Developed. Well Nourished.  Head: Normocephalic, atraumatic.  Mouth/Throat:  Moist mucous membranes.  Eyes: EOM grossly intact, conjunctiva normal.  Neck: Supple  Cardiovascular: Regular Rate and Rhythm, extremities well perfused.  Pulmonary/Chest: No respiratory distress. Lungs are symmetric to auscultation bilaterally.  Abdominal: Soft, non-tender, non-distended. Non peritoneal, no rebound, no guarding.  MSK: No Lower Extremity Edema.  Skin: Warm, dry, and intact  Neuro: Appropriate, CN II-XII grossly intact. Gait at patient's baseline  Psych: Pleasant           Procedures      Patient Data     Labs Ordered/Reviewed   COMPREHENSIVE METABOLIC PANEL, NON-FASTING - Abnormal; Notable for the following components:       Result Value    ALBUMIN 3.3 (*)     GLUCOSE 134  (*)     OSMOLALITY, CALCULATED 291 (*)     All other components within normal limits    Narrative:     Estimated Glomerular Filtration Rate (eGFR) is calculated using the CKD-EPI (2021) equation, intended for patients 47 years of age and older. If gender is not documented or "unknown", there will be no eGFR calculation.   CBC WITH DIFF - Abnormal; Notable for the following components:    HGB 12.3 (*)     RDW 15.6 (*)     LYMPHOCYTE % 23 (*)     All other components within normal limits   RAPID THROAT SCREEN, STREPTOCOCCUS, WITH REFLEX - Normal    Narrative:     Walk-Away Mode   COVID-19, FLU A/B, RSV RAPID BY PCR - Normal    Narrative:     Results are for the simultaneous qualitative identification of SARS-CoV-2 (formerly 2019-nCoV), Influenza A, Influenza B, and RSV RNA. These etiologic agents are generally detectable in nasopharyngeal and nasal swabs during the ACUTE PHASE of infection. Hence, this test is intended to be performed on respiratory specimens collected from individuals with signs and symptoms of upper respiratory tract infection who meet Centers for Disease Control and Prevention (CDC) clinical and/or epidemiological criteria  for Coronavirus Disease 2019 (COVID-19) testing. CDC COVID-19 criteria for testing on human specimens is available at [facility] webpage information for Healthcare Professionals: Coronavirus Disease 2019 (COVID-19) ([email or web address]).     False-negative results may occur if the virus has genomic mutations, insertions, deletions, or rearrangements or if performed very early in the course of illness. Otherwise, negative results indicate virus specific RNA targets are not detected, however negative results do not preclude SARS-CoV-2 infection/COVID-19, Influenza, or Respiratory syncytial virus infection. Results should not be used as the sole basis for patient management decisions. Negative results must be combined with clinical observations,  patient history, and epidemiological information. If upper respiratory tract infection is still suspected based on exposure history together with other clinical findings, re-testing should be considered.    Disclaimer:   This assay has been authorized by FDA under an Emergency Use Authorization for use in laboratories certified under the Clinical Laboratory Improvement Amendments of 1988 (CLIA), 42 U.S.C. [phone], to perform high complexity tests. The impacts of vaccines, antiviral therapeutics, antibiotics, chemotherapeutic or immunosuppressant drugs have not been evaluated.     Test methodology:   Cepheid Xpert Xpress SARS-CoV-2/Flu/RSV Assay real-time polymerase chain reaction (RT-PCR) test on the GeneXpert Dx and Xpert Xpress systems.   THROAT CULTURE, BETA HEMOLYTIC STREPTOCOCCUS   CBC/DIFF    Narrative:     The following orders were created for panel order CBC/DIFF.  Procedure                               Abnormality         Status                     ---------                               -----------         ------                     CBC WITH KGOV[[id number]]                Abnormal            Final result                 Please view results for these tests on the individual orders.   URINALYSIS WITH REFLEX MICROSCOPIC AND CULTURE IF POSITIVE    Narrative:     The following orders were created for panel order URINALYSIS WITH REFLEX MICROSCOPIC AND CULTURE IF POSITIVE.  Procedure                               Abnormality         Status                     ---------                               -----------         ------                     URINALYSIS, MACRO/MICRO[[id number]]  In process                   Please view results for these tests on the individual orders.   URINALYSIS, MACRO/MICRO       XR CHEST PA AND LATERAL   Preliminary Result by Edi, Radresults In (04/08 1530)   No acute chest abnormality or acute infiltrates are seen at this time.                  Radiologist location  ID: [id number]             Medical Decision Making          Medical Decision Making  Amount and/or Complexity of Data Reviewed  Labs: ordered.  Radiology: ordered.    Risk  Prescription drug management.          Studies Assessed:  Lab, imaging        MDM Narrative:  Patient presents to ED today for runny nose abdominal pain aches vomiting and diarrhea.  Patient states that she last vomited approximately 1 hour ago.  Patient's labs are unremarkable.  Patient was negative for COVID flu and RSV.  Instructed patient she likely had the stomach virus.  Patient states her grandson recently came home from school yesterday with the same symptoms.  Patient states she feels significantly better after receiving the Zofran and the fluids.  Patient was given Decadron and Pepcid for itching which occurred after she had labs the tape was placed on her skin.  No rash noticed on the patient's skin.  Patient was discharged home instructed to increase oral intake and pick up the Zofran that is since her pharmacy.  Patient verbalized understanding.             Medications Administered in the ED   NS bolus infusion 1,000 mL (0 mL Intravenous Stopped 09/05/22 1610)   ondansetron (ZOFRAN) 2 mg/mL injection (4 mg Intravenous Given 09/05/22 1509)   famotidine (PEPCID) 10 mg/mL injection (20 mg Intravenous Given 09/05/22 1546)   dexAMETHasone (PF) 10 mg/mL injection (8 mg Intravenous Given 09/05/22 1546)       Following the history, physical exam, and ED workup, the patient was deemed stable and suitable for discharge. The patient/caregiver was advised to return to the ED for any new or worsening symptoms. Discharge medications, and follow-up instructions were discussed with the patient/caregiver in detail, who verbalizes understanding. The patient/caregiver is in agreement and is comfortable with the plan of care.    Disposition: Discharged         Current Discharge Medication List        CONTINUE these medications - NO CHANGES were made during  your visit.        Details   albuterol sulfate 90 mcg/actuation oral inhaler  Commonly known as: PROVENTIL or VENTOLIN or PROAIR   1-2 Puffs, Inhalation, EVERY 6 HOURS PRN  Refills: 0     ARIPiprazole 10 mg Tablet  Commonly known as: ABILIFY   10 mg, Oral, DAILY  Refills: 0     atorvastatin 20 mg Tablet  Commonly known as: LIPITOR   20 mg, Oral, EVERY EVENING  Refills: 0     BYETTA SUBQ   Subcutaneous  Refills: 0     cetirizine 10 mg Tablet  Commonly known as: zyrTEC   10 mg, Oral, DAILY  Refills: 0     dicyclomine 10 mg Capsule  Commonly known as: BENTYL   10 mg, Oral, 4  TIMES DAILY  Qty: 20 Capsule  Refills: 0     famotidine 40 mg Tablet  Commonly known as: PEPCID   40 mg, Oral, 2 TIMES DAILY  Qty: 20 Tablet  Refills: 0     fluconazole 150 mg Tablet  Commonly known as: DIFLUCAN   150 mg, Oral, ONCE  Refills: 0     FLUoxetine 10 mg Capsule  Commonly known as: PROzac   10 mg, Oral, DAILY  Refills: 0     * gabapentin 300 mg Capsule  Commonly known as: NEURONTIN   300 mg, Oral  Refills: 0     * gabapentin 300 mg Capsule  Commonly known as: NEURONTIN   900 mg, Oral, 2 TIMES DAILY  Qty: 18 Capsule  Refills: 0     insulin aspart U-100 100 unit/mL (3 mL) Insulin Pen  Commonly known as: NovoLOG Flexpen U-100 Insulin   8 Units, Subcutaneous, 3 TIMES DAILY BEFORE MEALS  Qty: 15 mL  Refills: 0     insulin detemir U-100 100 unit/mL (3 mL) Insulin Pen  Commonly known as: LEVEMIR   40 Units, Subcutaneous, EVERY EVENING  Refills: 0     Jardiance 10 mg Tablet  Generic drug: empagliflozin   10 mg, Oral, DAILY  Refills: 0     ketorolac tromethamine 10 mg Tablet  Commonly known as: TORADOL   10 mg, Oral, EVERY 6 HOURS PRN  Qty: 20 Tablet  Refills: 0     levothyroxine 125 mcg Tablet  Commonly known as: SYNTHROID   125 mcg, Oral, EVERY MORNING  Qty: 30 Tablet  Refills: 0     lisinopriL 10 mg Tablet  Commonly known as: PRINIVIL   10 mg, Oral, DAILY  Refills: 0     loperamide 2 mg Capsule  Commonly known as: IMODIUM   2 mg, Oral, EVERY 4  HOURS PRN  Qty: 30 Capsule  Refills: 0     * metFORMIN 500 mg Tablet  Commonly known as: GLUCOPHAGE   1,000 mg, Oral, 2 TIMES DAILY  Refills: 0     * MetFORMIN 1,000 mg Tablet  Commonly known as: GLUCOPHAGE   1,000 mg, Oral, 2 TIMES DAILY WITH FOOD  Qty: 180 Tablet  Refills: 4     Methylphenidate 20 mg Tablet  Commonly known as: RITALIN   20 mg, Oral, 4 TIMES DAILY  Refills: 0     montelukast 10 mg Tablet  Commonly known as: SINGULAIR   10 mg, Oral, EVERY EVENING  Refills: 0     naproxen 500 mg Tablet  Commonly known as: NAPROSYN   500 mg, Oral, 2 TIMES DAILY WITH FOOD  Refills: 0     omeprazole 40 mg Capsule, Delayed Release(E.C.)  Commonly known as: PRILOSEC   40 mg, Oral, 2 TIMES DAILY  Refills: 0     ondansetron 4 mg Tablet, Rapid Dissolve  Commonly known as: ZOFRAN ODT   4 mg, Oral, EVERY 8 HOURS PRN  Qty: 12 Tablet  Refills: 0     phenazopyridine 200 mg Tablet  Commonly known as: PYRIDIUM   200 mg, Oral, 3 TIMES DAILY  Qty: 9 Tablet  Refills: 0           * This list has 4 medication(s) that are the same as other medications prescribed for you. Read the directions carefully, and ask your doctor or other care provider to review them with you.  Follow up:   Lilia Pro, FNP  [address]  [place] [address]  [phone]                     Clinical Impression   Gastritis, presence of bleeding unspecified, unspecified chronicity, unspecified gastritis type (Primary)   Norovirus   Viral illness   Nausea & vomiting         Discharge Medication List as of 09/05/2022  4:42 PM              P. Dayton Scrape FNP-C  Department of Emergency Medicine

## 2022-09-06 DIAGNOSIS — R059 Cough, unspecified: Secondary | ICD-10-CM

## 2022-09-06 DIAGNOSIS — R0602 Shortness of breath: Secondary | ICD-10-CM

## 2022-09-08 LAB — THROAT CULTURE, BETA HEMOLYTIC STREPTOCOCCUS: THROAT CULTURE: NORMAL

## 2022-09-22 ENCOUNTER — Encounter (HOSPITAL_BASED_OUTPATIENT_CLINIC_OR_DEPARTMENT_OTHER): Payer: Self-pay

## 2022-09-22 ENCOUNTER — Emergency Department
Admission: EM | Admit: 2022-09-22 | Discharge: 2022-09-22 | Disposition: A | Discharge status: Home / Self Care | Attending: Family | Admitting: Family

## 2022-09-22 ENCOUNTER — Emergency Department (HOSPITAL_BASED_OUTPATIENT_CLINIC_OR_DEPARTMENT_OTHER)

## 2022-09-22 ENCOUNTER — Other Ambulatory Visit: Payer: Self-pay

## 2022-09-22 DIAGNOSIS — R109 Unspecified abdominal pain: Secondary | ICD-10-CM

## 2022-09-22 DIAGNOSIS — N83202 Unspecified ovarian cyst, left side: Secondary | ICD-10-CM

## 2022-09-22 DIAGNOSIS — Z87442 Personal history of urinary calculi: Secondary | ICD-10-CM | POA: Insufficient documentation

## 2022-09-22 DIAGNOSIS — N390 Urinary tract infection, site not specified: Secondary | ICD-10-CM

## 2022-09-22 DIAGNOSIS — K5900 Constipation, unspecified: Secondary | ICD-10-CM | POA: Insufficient documentation

## 2022-09-22 DIAGNOSIS — Z87440 Personal history of urinary (tract) infections: Secondary | ICD-10-CM

## 2022-09-22 DIAGNOSIS — Z91148 Patient's other noncompliance with medication regimen for other reason: Secondary | ICD-10-CM | POA: Insufficient documentation

## 2022-09-22 DIAGNOSIS — R1032 Left lower quadrant pain: Secondary | ICD-10-CM

## 2022-09-22 LAB — COMPREHENSIVE METABOLIC PANEL, NON-FASTING
ALBUMIN/GLOBULIN RATIO: 0.9 (ref 0.8–1.4)
ALBUMIN: 3.5 g/dL (ref 3.4–5.0)
ALKALINE PHOSPHATASE: 93 U/L (ref 46–116)
ALT (SGPT): 34 U/L (ref <=78)
ANION GAP: 8 mmol/L (ref 4–13)
AST (SGOT): 21 U/L (ref 15–37)
BILIRUBIN TOTAL: 0.5 mg/dL (ref 0.2–1.0)
BUN/CREA RATIO: 16
BUN: 16 mg/dL (ref 7–18)
CALCIUM, CORRECTED: 9.2 mg/dL
CALCIUM: 8.8 mg/dL (ref 8.5–10.1)
CHLORIDE: 103 mmol/L (ref 98–107)
CO2 TOTAL: 28 mmol/L (ref 21–32)
CREATININE: 1.03 mg/dL — ABNORMAL HIGH (ref 0.55–1.02)
ESTIMATED GFR: 67 mL/min/{1.73_m2} (ref >59)
GLOBULIN: 3.7
GLUCOSE: 163 mg/dL — ABNORMAL HIGH (ref 74–106)
OSMOLALITY, CALCULATED: 282 mOsm/kg (ref 270–290)
POTASSIUM: 3.7 mmol/L (ref 3.5–5.1)
PROTEIN TOTAL: 7.2 g/dL (ref 6.4–8.2)
SODIUM: 139 mmol/L (ref 136–145)

## 2022-09-22 LAB — URINALYSIS, MICROSCOPIC

## 2022-09-22 LAB — URINALYSIS, MACRO/MICRO
BILIRUBIN: NEGATIVE mg/dL
BLOOD: NEGATIVE mg/dL
GLUCOSE: 100 mg/dL — AB
LEUKOCYTES: NEGATIVE WBCs/uL
NITRITE: POSITIVE — AB
PH: 6 (ref 4.6–8.0)
PROTEIN: 100 mg/dL — AB
SPECIFIC GRAVITY: >=1.03 (ref 1.003–1.035)
UROBILINOGEN: 1 mg/dL (ref 0.2–1.0)

## 2022-09-22 LAB — CBC WITH DIFF
BASOPHIL #: 0.03 10*3/uL (ref 0.00–0.30)
BASOPHIL %: 0 % (ref 0–3)
EOSINOPHIL #: 0.25 10*3/uL (ref 0.00–0.80)
EOSINOPHIL %: 2 % (ref 0–7)
HCT: 40.6 % (ref 37.0–47.0)
HGB: 13.5 g/dL (ref 12.5–16.0)
LYMPHOCYTE #: 2.34 10*3/uL (ref 1.10–5.00)
LYMPHOCYTE %: 23 % — ABNORMAL LOW (ref 25–45)
MCH: 29.9 pg (ref 27.0–32.0)
MCHC: 33.3 g/dL (ref 32.0–36.0)
MCV: 89.6 fL (ref 78.0–99.0)
MONOCYTE #: 0.74 10*3/uL (ref 0.00–1.30)
MONOCYTE %: 7 % (ref 0–12)
MPV: 8.8 fL (ref 7.4–10.4)
NEUTROPHIL #: 6.64 10*3/uL (ref 1.80–8.40)
NEUTROPHIL %: 67 % (ref 40–76)
PLATELETS: 235 10*3/uL (ref 140–440)
RBC: 4.53 10*6/uL (ref 4.20–5.40)
RDW: 15.8 % — ABNORMAL HIGH (ref 11.6–14.8)
WBC: 10 10*3/uL (ref 4.0–10.5)

## 2022-09-22 MED ORDER — CEFTRIAXONE 1 GRAM SOLUTION FOR INJECTION
INTRAMUSCULAR | Status: AC
Start: 2022-09-22 — End: 2022-09-22
  Filled 2022-09-22: qty 10

## 2022-09-22 MED ORDER — CEFUROXIME AXETIL 500 MG TABLET
500.0000 mg | ORAL_TABLET | Freq: Two times a day (BID) | ORAL | 0 refills | Status: DC
Start: 2022-09-22 — End: 2023-06-22

## 2022-09-22 MED ORDER — LIDOCAINE HCL 10 MG/ML (1 %) INJECTION SOLUTION
1.0000 g | Freq: Once | INTRAMUSCULAR | Status: AC
Start: 2022-09-22 — End: 2022-09-22
  Administered 2022-09-22: 1 g via INTRAMUSCULAR

## 2022-09-22 MED ORDER — KETOROLAC 60 MG/2 ML INTRAMUSCULAR SOLUTION
60.0000 mg | INTRAMUSCULAR | Status: AC
Start: 2022-09-22 — End: 2022-09-22
  Administered 2022-09-22: 60 mg via INTRAMUSCULAR

## 2022-09-22 MED ORDER — KETOROLAC 60 MG/2 ML INTRAMUSCULAR SOLUTION
INTRAMUSCULAR | Status: AC
Start: 2022-09-22 — End: 2022-09-22
  Filled 2022-09-22: qty 2

## 2022-09-22 MED ORDER — KETOROLAC 10 MG TABLET
10.0000 mg | ORAL_TABLET | Freq: Four times a day (QID) | ORAL | 0 refills | Status: DC | PRN
Start: 2022-09-22 — End: 2023-06-22

## 2022-09-22 MED ORDER — ONDANSETRON 4 MG DISINTEGRATING TABLET
4.0000 mg | ORAL_TABLET | Freq: Three times a day (TID) | ORAL | 0 refills | Status: DC | PRN
Start: 2022-09-22 — End: 2023-06-22

## 2022-09-22 NOTE — ED Nurses Note (Signed)
Patient informed she has a UTI.

## 2022-09-22 NOTE — ED Nurses Note (Signed)
Patient discharged home with family.  AVS reviewed with patient/care giver.  A written copy of the AVS and discharge instructions was given to the patient/care giver. Scripts sent to pharmacy on file. Questions sufficiently answered as needed.  Patient/care giver encouraged to follow up with PCP as indicated.  In the event of an emergency, patient/care giver instructed to call 911 or go to the nearest emergency room.

## 2022-09-22 NOTE — ED Nurses Note (Signed)
Pt states she is still in some pain but it has eased up some. Denies any other wants or needs at this time. Respirations even and unlabored w/ no s/s of acute distress noted at this time.

## 2022-09-22 NOTE — ED Nurses Note (Signed)
Pt verbalized understanding of discharge instructions. Denies any questions or concerns at this time. Respirations even and unlabored w/ no s/s of acute distress noted at this time. Pt noted walking down hall w/ steady gait.

## 2022-09-22 NOTE — ED Triage Notes (Addendum)
Pt reports left flank pain, painful urination. Started Sunday. Pt also  reports DM type II. States she has not had Ozempic x1 week, pt has been unable to check BS, has not received glucometer from pharmacy.

## 2022-09-22 NOTE — ED Provider Notes (Signed)
[facility], [facility] Emergency Department  ED Primary Provider Note  History of Present Illness   Chief Complaint   Patient presents with    Flank Pain     Arrival: The patient arrived by Car  Jennifer [name] is a 47 y.o. female who had concerns including Flank Pain. Left flank pain to llq burning with urination hx of stones and freq UTI. Supposed to be on ozempic can't find it and out of insulin.   Review of Systems   Constitutional: No fever, chills or weakness   Skin: No rash or diaphoresis  HENT: No headaches, or congestion  Eyes: No vision changes or photophobia   Cardio: No chest pain, palpitations or leg swelling   Respiratory: No cough, wheezing or SOB  GI:  No nausea, vomiting or stool changes  GU:  +dysuria, no hematuria, or increased frequency+ left flank pain to abd   MSK: No muscle aches, joint or back pain  Neuro: No seizures, LOC, numbness, tingling, or focal weakness  Psychiatric: No depression, SI or substance abuse  All other systems reviewed and are negative.    History Reviewed This Encounter: all noted and reviewed    Physical Exam   ED Triage Vitals [09/22/22 1731]   BP (Non-Invasive) (!) 166/101   Heart Rate 96   Respiratory Rate 17   Temperature 36.3 C (97.4 F)   SpO2 99 %   Weight 98.4 kg (217 lb)   Height 1.702 m (5\' 7" )     Constitutional:  47 y.o. female who appears in no distress. Normal color, no cyanosis.   HENT:   Head: Normocephalic and atraumatic.   Mouth/Throat: Oropharynx is clear and moist.   Eyes: EOMI, PERRL   Neck: Trachea midline. Neck supple.  Cardiovascular: RRR, No murmurs, rubs or gallops. Intact distal pulses.  Pulmonary/Chest: BS equal bilaterally. No respiratory distress. No wheezes, rales or chest tenderness.   Abdominal: Bowel sounds present and normal. Abdomen soft, no tenderness, no rebound and no guarding.  Back: No midline spinal tenderness, no paraspinal tenderness, + left  CVA tenderness.           Musculoskeletal: No edema,  tenderness or deformity.  Skin: warm and dry. No rash, erythema, pallor or cyanosis  Psychiatric: normal mood and affect. Behavior is normal.   Neurological: Patient keenly alert and responsive, easily able to raise eyebrows, facial muscles/expressions symmetric, speaking in fluent sentences, moving all extremities equally and fully, normal gait  Patient Data     Labs Ordered/Reviewed   URINALYSIS, MACRO/MICRO - Abnormal; Notable for the following components:       Result Value    COLOR Orange (*)     APPEARANCE Slightly Cloudy (*)     NITRITE Positive (*)     PROTEIN 100 (*)     GLUCOSE 100 (*)     KETONES Trace (*)     All other components within normal limits   COMPREHENSIVE METABOLIC PANEL, NON-FASTING - Abnormal; Notable for the following components:    CREATININE 1.03 (*)     GLUCOSE 163 (*)     All other components within normal limits    Narrative:     Estimated Glomerular Filtration Rate (eGFR) is calculated using the CKD-EPI (2021) equation, intended for patients 50 years of age and older. If gender is not documented or "unknown", there will be no eGFR calculation.   CBC WITH DIFF - Abnormal; Notable for the following components:    RDW  15.8 (*)     LYMPHOCYTE % 23 (*)     All other components within normal limits   URINALYSIS, MICROSCOPIC - Abnormal; Notable for the following components:    BACTERIA Moderate (*)     MUCOUS Moderate (*)     SQUAMOUS EPITHELIAL Several (*)     All other components within normal limits   URINE CULTURE,ROUTINE   URINALYSIS WITH REFLEX MICROSCOPIC AND CULTURE IF POSITIVE    Narrative:     The following orders were created for panel order URINALYSIS WITH REFLEX MICROSCOPIC AND CULTURE IF POSITIVE.  Procedure                               Abnormality         Status                     ---------                               -----------         ------                     URINALYSIS, MACRO/MICRO[[id number]]      Abnormal            Final result               URINALYSIS,  MICROSCOPIC[[id number]]      Abnormal            Final result                 Please view results for these tests on the individual orders.   CBC/DIFF    Narrative:     The following orders were created for panel order CBC/DIFF.  Procedure                               Abnormality         Status                     ---------                               -----------         ------                     CBC WITH RUEA[[id number]]                Abnormal            Final result                 Please view results for these tests on the individual orders.     CT ABDOMEN PELVIS WO IV CONTRAST   Final Result by Edi, Radresults In (04/25 1949)   6.4 cm suspected left ovarian cyst. Recommend follow-up ultrasound in 6-12 weeks time to document resolution of it.      No urinary obstruction.      Constipation.          One or more dose reduction techniques were used (e.g., Automated exposure control, adjustment of the mA and/or kV according to patient size, use of iterative reconstruction technique).  Radiologist location ID: [id number]           Medical Decision Making   Diff dx of UTI stone pyelonephritis .       No stones. Large ovarian cyst. UTI.   Medications Administered in the ED   ketorolac (TORADOL) 60mg /2 mL IM injection (60 mg IntraMUSCULAR Given 09/22/22 1753)   cefTRIAXone (ROCEPHIN) 1 g in lidocaine 2.86 mL (tot vol) IM injection (1 g IntraMUSCULAR Given 09/22/22 1858)     Clinical Impression   Left flank pain (Primary)   UTI (urinary tract infection)   Cyst of left ovary       Disposition: Discharged

## 2022-09-24 LAB — URINE CULTURE,ROUTINE: URINE CULTURE: 10000 — AB

## 2022-10-27 ENCOUNTER — Encounter (HOSPITAL_COMMUNITY): Payer: Self-pay | Admitting: Student in an Organized Health Care Education/Training Program

## 2022-10-27 ENCOUNTER — Emergency Department
Admission: EM | Admit: 2022-10-27 | Discharge: 2022-10-27 | Disposition: A | Discharge status: Home / Self Care | Attending: Student in an Organized Health Care Education/Training Program | Admitting: Student in an Organized Health Care Education/Training Program

## 2022-10-27 ENCOUNTER — Other Ambulatory Visit: Payer: Self-pay

## 2022-10-27 DIAGNOSIS — S00459A Superficial foreign body of unspecified ear, initial encounter: Secondary | ICD-10-CM

## 2022-10-27 DIAGNOSIS — S01541A Puncture wound with foreign body of lip, initial encounter: Secondary | ICD-10-CM | POA: Insufficient documentation

## 2022-10-27 DIAGNOSIS — W268XXA Contact with other sharp object(s), not elsewhere classified, initial encounter: Secondary | ICD-10-CM | POA: Insufficient documentation

## 2022-10-27 MED ORDER — CEPHALEXIN 500 MG CAPSULE
ORAL_CAPSULE | ORAL | Status: AC
Start: 2022-10-27 — End: 2022-10-27
  Filled 2022-10-27: qty 1

## 2022-10-27 MED ORDER — DIPHTH,PERTUSSIS(ACEL),TETANUS 2.5 LF UNIT-8 MCG-5 LF/0.5ML IM SYRINGE
0.5000 mL | INJECTION | INTRAMUSCULAR | Status: AC
Start: 2022-10-27 — End: 2022-10-27
  Administered 2022-10-27: 0.5 mL via INTRAMUSCULAR

## 2022-10-27 MED ORDER — DIPHTH,PERTUSSIS(ACEL),TETANUS 2.5 LF UNIT-8 MCG-5 LF/0.5ML IM SYRINGE
INJECTION | INTRAMUSCULAR | Status: AC
Start: 2022-10-27 — End: 2022-10-27
  Filled 2022-10-27: qty 0.5

## 2022-10-27 MED ORDER — CEPHALEXIN 500 MG CAPSULE
500.0000 mg | ORAL_CAPSULE | ORAL | Status: AC
Start: 2022-10-27 — End: 2022-10-27
  Administered 2022-10-27: 500 mg via ORAL

## 2022-10-27 MED ORDER — LIDOCAINE-EPINEPHRINE (PF) 1 %-1:100,000 INJECTION SOLUTION
10.0000 mL | Freq: Once | INTRAMUSCULAR | Status: AC
Start: 2022-10-27 — End: 2022-10-27
  Administered 2022-10-27: 100 mg via INTRADERMAL
  Filled 2022-10-27: qty 10

## 2022-10-27 MED ORDER — CEPHALEXIN 500 MG CAPSULE
500.0000 mg | ORAL_CAPSULE | Freq: Two times a day (BID) | ORAL | 0 refills | Status: AC
Start: 2022-10-27 — End: 2022-11-01

## 2022-10-27 MED ORDER — LIDOCAINE-EPINEPHRINE (PF) 1 %-1:200,000 INJECTION SOLUTION
INTRAMUSCULAR | Status: AC
Start: 2022-10-27 — End: 2022-10-27
  Filled 2022-10-27: qty 10

## 2022-10-27 NOTE — ED Nurses Note (Signed)
Provider at bedside  small incision mede with scalp on underside of upper lib  post then pulled through  tolerated  well

## 2022-10-27 NOTE — Discharge Instructions (Signed)
Keep area clean and dry.  Take Keflex as directed for duration directed.  If you do develop fever, chills, drainage, redness or for any other symptoms arise or concerns you return emergency department.

## 2022-10-27 NOTE — ED Provider Notes (Signed)
[facility]  ED Primary Provider Note  History of Present Illness   Chief Complaint   Patient presents with    Mouth Pain     Jennifer [name] is a 47 y.o. female who had concerns including Mouth Pain.  Arrival: The patient arrived by Car    47 year old female arrives today requesting removal of her upper lip piercing.  Patient reports the back of the earring had healed within the front of her lip and now it is hurting.  Patient reports she went to the tattoo artist today who advised the patient to come to the ED he was unable to remove it.  Patient states it has been causing her some pain when manipulated.  Patient denies any fevers, chills, chest pain, inflammation, redness, erythema.      History Reviewed This Encounter: Medical History  Surgical History  Family History  Social History    Physical Exam   ED Triage Vitals [10/27/22 1141]   BP (Non-Invasive) (!) 173/99   Heart Rate (!) 102   Respiratory Rate 20   Temperature 36.8 C (98.3 F)   SpO2 100 %   Weight 104 kg (230 lb)   Height 1.737 m (5' 8.4")     Physical Exam  Vitals and nursing note reviewed.   Constitutional:       Appearance: Normal appearance.   HENT:      Head: Atraumatic.   Cardiovascular:      Rate and Rhythm: Normal rate and regular rhythm.      Pulses: Normal pulses.      Heart sounds: Normal heart sounds.   Pulmonary:      Effort: Pulmonary effort is normal.      Breath sounds: Normal breath sounds.   Skin:     General: Skin is warm.      Capillary Refill: Capillary refill takes less than 2 seconds.      Coloration: Skin is not jaundiced or pale.      Findings: No bruising, erythema, lesion or rash.   Neurological:      General: No focal deficit present.      Mental Status: She is alert and oriented to person, place, and time. Mental status is at baseline.       Patient Data   Labs Ordered/Reviewed - No data to display  No orders to display     Medical Decision Making        Medical Decision  Making  Patient was offered x2 follow up with General surgery patient stated that did not want to follow up with General surgery and wanted to proceed with removal of earring at this time.  Earring was successfully removed no complications noted.  See procedure note.  Patient was started on Keflex prophylactically for 5 days.  Patient's tetanus updated.  Strict ED return precautions.  All questions and concerns addressed    Risk  Prescription drug management.              FB Removal    Date/Time: 10/27/2022 12:32 PM    Performed by: Vic Ripper, PA-C  Authorized by: Vic Ripper, PA-C    Consent:     Consent obtained:  Verbal    Risks discussed:  Bleeding, infection, pain, incomplete removal, nerve damage and poor cosmetic result    Alternatives discussed:  Referral  Universal protocol:     Patient identity confirmed:  Verbally with patient  Location:     Location:  Mouth  Mouth location:  Upper inner lip    Tendon involvement:  None  Pre-procedure details:     Imaging:  None  Anesthesia:     Anesthesia method:  Local infiltration    Local anesthetic:  Lidocaine 1% WITH epi  Procedure type:     Procedure complexity:  Simple  Procedure details:     Incision length:  .25cm    Dissection of underlying tissues: no      Bloodless field: no      Removal mechanism:  Hemostat    Foreign bodies recovered:  1    Description:  Removal of the earring    Intact foreign body removal: yes    Post-procedure details:     Confirmation:  No additional foreign bodies on visualization    Skin closure:  None    Dressing:  Open (no dressing)    Procedure completion:  Tolerated  Comments:      Site of lip piercing identified on inner lip.  Approximately 0.5 mL of 1% lidocaine injected.  Anesthesia obtained.  Small 0.25 cm incision made back of the earring was extracted through opening.  Complete removal of hearing was observed.  No active bleeding at this time.  No wound closure was performed at this time due to no bleeding and risk  of developing infection.  Patient's tetanus was updated patient will be started on Keflex 5 days.        Medications Administered in the ED   lidocaine 1%-EPINEPHrine 1:100,000 (PF) injection (100 mg Intradermal Given 10/27/22 1300)   cephalexin (KEFLEX) capsule (500 mg Oral Given 10/27/22 1232)   diphtheria, pertussis-acell, tetanus (BOOSTRIX) IM injection (0.5 mL IntraMUSCULAR Given 10/27/22 1232)     Clinical Impression   Embedded earring (Primary)       Disposition: Discharged

## 2022-10-27 NOTE — ED Triage Notes (Signed)
Lip piercing unable to get out

## 2022-10-27 NOTE — ED Nurses Note (Signed)
Bleeding of upper lip has stopped

## 2022-12-22 ENCOUNTER — Emergency Department (HOSPITAL_COMMUNITY)

## 2022-12-22 ENCOUNTER — Other Ambulatory Visit: Payer: Self-pay

## 2022-12-22 ENCOUNTER — Emergency Department
Admission: EM | Admit: 2022-12-22 | Discharge: 2022-12-22 | Disposition: A | Discharge status: Home / Self Care | Attending: Emergency Medicine | Admitting: Emergency Medicine

## 2022-12-22 ENCOUNTER — Other Ambulatory Visit (HOSPITAL_BASED_OUTPATIENT_CLINIC_OR_DEPARTMENT_OTHER): Admitting: Student in an Organized Health Care Education/Training Program

## 2022-12-22 DIAGNOSIS — N939 Abnormal uterine and vaginal bleeding, unspecified: Secondary | ICD-10-CM | POA: Insufficient documentation

## 2022-12-22 DIAGNOSIS — Z87891 Personal history of nicotine dependence: Secondary | ICD-10-CM | POA: Insufficient documentation

## 2022-12-22 DIAGNOSIS — N83201 Unspecified ovarian cyst, right side: Secondary | ICD-10-CM | POA: Insufficient documentation

## 2022-12-22 DIAGNOSIS — N852 Hypertrophy of uterus: Secondary | ICD-10-CM | POA: Insufficient documentation

## 2022-12-22 DIAGNOSIS — N83202 Unspecified ovarian cyst, left side: Secondary | ICD-10-CM

## 2022-12-22 LAB — COMPREHENSIVE METABOLIC PANEL, NON-FASTING
ALBUMIN/GLOBULIN RATIO: 1.3 (ref 0.8–1.4)
ALBUMIN: 3.6 g/dL (ref 3.5–5.7)
ALKALINE PHOSPHATASE: 81 U/L (ref 34–104)
ALT (SGPT): 31 U/L (ref 7–52)
ANION GAP: 5 mmol/L (ref 4–13)
AST (SGOT): 31 U/L (ref 13–39)
BILIRUBIN TOTAL: 0.5 mg/dL (ref 0.3–1.0)
BUN/CREA RATIO: 19 (ref 6–22)
BUN: 12 mg/dL (ref 7–25)
CALCIUM, CORRECTED: 8.8 mg/dL — ABNORMAL LOW (ref 8.9–10.8)
CALCIUM: 8.5 mg/dL — ABNORMAL LOW (ref 8.6–10.3)
CHLORIDE: 106 mmol/L (ref 98–107)
CO2 TOTAL: 24 mmol/L (ref 21–31)
CREATININE: 0.64 mg/dL (ref 0.60–1.30)
ESTIMATED GFR: 110 mL/min/{1.73_m2} (ref >59)
GLOBULIN: 2.7 — ABNORMAL LOW (ref 2.9–5.4)
GLUCOSE: 275 mg/dL — ABNORMAL HIGH (ref 74–109)
OSMOLALITY, CALCULATED: 280 mOsm/kg (ref 270–290)
POTASSIUM: 3.7 mmol/L (ref 3.5–5.1)
PROTEIN TOTAL: 6.3 g/dL — ABNORMAL LOW (ref 6.4–8.9)
SODIUM: 135 mmol/L — ABNORMAL LOW (ref 136–145)

## 2022-12-22 LAB — C-REACTIVE PROTEIN (CRP): C-REACTIVE PROTEIN (CRP): <0.1 mg/dL — ABNORMAL LOW (ref 0.1–0.5)

## 2022-12-22 LAB — CBC WITH DIFF
BASOPHIL #: 0.1 10*3/uL (ref 0.00–0.10)
BASOPHIL %: 1 % (ref 0–1)
EOSINOPHIL #: 0.1 10*3/uL (ref 0.00–0.50)
EOSINOPHIL %: 1 %
HCT: 36.3 % (ref 31.2–41.9)
HGB: 12.1 g/dL (ref 10.9–14.3)
LYMPHOCYTE #: 1.4 10*3/uL (ref 1.00–3.00)
LYMPHOCYTE %: 19 % (ref 16–44)
MCH: 28.6 pg (ref 24.7–32.8)
MCHC: 33.3 g/dL (ref 32.3–35.6)
MCV: 85.9 fL (ref 75.5–95.3)
MONOCYTE #: 0.6 10*3/uL (ref 0.30–1.00)
MONOCYTE %: 8 % (ref 5–13)
MPV: 9.5 fL (ref 7.9–10.8)
NEUTROPHIL #: 5.5 10*3/uL (ref 1.85–7.80)
NEUTROPHIL %: 72 % (ref 43–77)
PLATELETS: 248 10*3/uL (ref 140–440)
RBC: 4.23 10*6/uL (ref 3.63–4.92)
RDW: 13.8 % (ref 12.3–17.7)
WBC: 7.7 10*3/uL (ref 3.8–11.8)

## 2022-12-22 LAB — URINALYSIS, MICROSCOPIC
RBCS: 5496 /hpf — ABNORMAL HIGH (ref <4)
SQUAMOUS EPITHELIAL: 3 /hpf (ref <28)
WBCS: 2 /hpf (ref <6)

## 2022-12-22 LAB — URINALYSIS, MACROSCOPIC
BILIRUBIN: NEGATIVE mg/dL
BLOOD: 1 mg/dL — AB
GLUCOSE: >1000 mg/dL — AB
KETONES: NEGATIVE mg/dL
LEUKOCYTES: 25 WBCs/uL — AB
NITRITE: NEGATIVE
PH: 6.5 (ref 5.0–9.0)
PROTEIN: 70 mg/dL — AB
SPECIFIC GRAVITY: 1.019 (ref 1.002–1.030)
UROBILINOGEN: NORMAL mg/dL

## 2022-12-22 LAB — GRAY TOP TUBE

## 2022-12-22 LAB — THYROXINE, FREE (FREE T4): THYROXINE (T4), FREE: 0.94 ng/dL (ref 0.58–1.64)

## 2022-12-22 LAB — MAGNESIUM: MAGNESIUM: 1.8 mg/dL — ABNORMAL LOW (ref 1.9–2.7)

## 2022-12-22 LAB — THYROID STIMULATING HORMONE WITH FREE T4 REFLEX: TSH: 0.024 u[IU]/mL — ABNORMAL LOW (ref 0.450–5.330)

## 2022-12-22 LAB — TROPONIN-I: TROPONIN I: 3 ng/L (ref <15)

## 2022-12-22 LAB — PT/INR
INR: 1.07 (ref 0.84–1.10)
PROTHROMBIN TIME: 12.5 seconds (ref 9.8–12.7)

## 2022-12-22 LAB — PTT (PARTIAL THROMBOPLASTIN TIME): APTT: 28.5 seconds (ref 25.0–38.0)

## 2022-12-22 MED ORDER — MAGNESIUM SULFATE 1 GRAM/100 ML IN DEXTROSE 5 % INTRAVENOUS PIGGYBACK
1.0000 g | INJECTION | INTRAVENOUS | Status: AC
Start: 2022-12-22 — End: 2022-12-22
  Administered 2022-12-22: 1 g via INTRAVENOUS
  Administered 2022-12-22 (×2): 0 g via INTRAVENOUS
  Administered 2022-12-22: 1 g via INTRAVENOUS

## 2022-12-22 MED ORDER — MAGNESIUM SULFATE 1 GRAM/100 ML IN DEXTROSE 5 % INTRAVENOUS PIGGYBACK
INJECTION | INTRAVENOUS | Status: AC
Start: 2022-12-22 — End: 2022-12-22
  Filled 2022-12-22: qty 200

## 2022-12-22 MED ORDER — SODIUM CHLORIDE 0.9 % IV BOLUS
1000.0000 mL | INJECTION | Status: AC
Start: 2022-12-22 — End: 2022-12-22
  Administered 2022-12-22: 0 mL via INTRAVENOUS
  Administered 2022-12-22: 1000 mL via INTRAVENOUS
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
## 2022-12-22 NOTE — ED Triage Notes (Signed)
Patient c/o lightheadedness with vaginal bleeding for a month. Patient has been light headed for a few days and was sent home from work yesterday

## 2022-12-22 NOTE — ED Nurses Note (Signed)
Patient to ED 5 via wheelchair with complaints of vaginal bleeding X1 month. Patient states for the last week she has also had dizziness. Patient denies any current chest pain, SOB, headache, back pain, or N/V/D. Patient placed on bedside monitoring. Assessment as documented. Call bell in reach.

## 2022-12-22 NOTE — ED Nurses Note (Signed)
Patient discharged to home. A written copy of discharge instructions was given to the patient. Questions sufficiently answered as needed. Patient encouraged to follow up with PCP as indicated. IV removed, catheter intact. Pressure dressing applied and bleeding controlled. Patient left department via ambulation.

## 2022-12-22 NOTE — Discharge Instructions (Signed)
THE PATIENT IS TO FOLLOW UP WITH THE PRIMARY CARE PROVIDER AS SOON AS POSSIBLE BUT NO LATER THAN 3 DAYS FROM LEAVING THE EMERGENCY DEPARTMENT.  IF NO PRIMARY CARE PROVIDER EXISTS, THEN THE PATIENT IS INSTRUCTED TO ESTABLISH CARE WITH A PRIMARY CARE PROVIDER AS SOON AS POSSIBLE BUT NO LATER THAN 3 DAYS FROM LEAVING THE EMERGENCY DEPARTMENT.  FOLLOW-UP WITH ANY SPECIALIST PROVIDER AS INDICATED AS SOON AS POSSIBLE BUT NO LATER THAN 3 DAYS, IF APPLICABLE.  NOTIFY THE PRIMARY CARE PROVIDER THAT YOU WERE IN THE EMERGENCY DEPARTMENT WITHIN 24 HOURS OF DISCHARGE TO FOLLOW-UP ON YOUR RESULTS AND/OR TREATMENTS.  RETURN TO THE EMERGENCY DEPARTMENT IMMEDIATELY IF NEEDED, NO BETTER, WORSE, NEW SYMPTOMS ARISE, OR YOU CANNOT FOLLOW-UP WITH YOUR PRIMARY CARE PROVIDER AND/OR APPLICABLE SPECIALIST IN THE PRESCRIBED TIMEFRAME.

## 2022-12-28 DIAGNOSIS — R9431 Abnormal electrocardiogram [ECG] [EKG]: Secondary | ICD-10-CM

## 2022-12-28 DIAGNOSIS — I517 Cardiomegaly: Secondary | ICD-10-CM

## 2022-12-28 LAB — ECG 12 LEAD
Atrial Rate: 100 {beats}/min
Calculated P Axis: 64 degrees
Calculated R Axis: 23 degrees
Calculated T Axis: 44 degrees
PR Interval: 150 ms
QRS Duration: 80 ms
QT Interval: 374 ms
QTC Calculation: 482 ms
Ventricular rate: 100 {beats}/min

## 2023-01-02 DIAGNOSIS — N939 Abnormal uterine and vaginal bleeding, unspecified: Secondary | ICD-10-CM

## 2023-01-02 LAB — SURGICAL PATHOLOGY SPECIMEN: Clinical History: ABNORMAL

## 2023-01-15 ENCOUNTER — Other Ambulatory Visit: Payer: Self-pay

## 2023-01-15 ENCOUNTER — Encounter (HOSPITAL_COMMUNITY): Payer: Self-pay | Admitting: Emergency Medicine

## 2023-01-15 ENCOUNTER — Emergency Department
Admission: EM | Admit: 2023-01-15 | Discharge: 2023-01-15 | Disposition: A | Discharge status: Home / Self Care | Attending: Emergency Medicine | Admitting: Emergency Medicine

## 2023-01-15 DIAGNOSIS — Y990 Civilian activity done for income or pay: Secondary | ICD-10-CM | POA: Insufficient documentation

## 2023-01-15 DIAGNOSIS — L299 Pruritus, unspecified: Secondary | ICD-10-CM

## 2023-01-15 MED ORDER — DIPHENHYDRAMINE 25 MG CAPSULE
ORAL_CAPSULE | ORAL | Status: AC
Start: 2023-01-15 — End: 2023-01-15
  Filled 2023-01-15: qty 1

## 2023-01-15 MED ORDER — DIPHENHYDRAMINE 25 MG CAPSULE
25.0000 mg | ORAL_CAPSULE | ORAL | Status: AC
Start: 2023-01-15 — End: 2023-01-15
  Administered 2023-01-15: 25 mg via ORAL

## 2023-01-15 NOTE — Discharge Instructions (Addendum)
Take oral over-the-counter Benadryl 25 mg every 6 hours as needed for itching or rash  Follow-up with family doctor for recheck in 2-3 days  Return to emergency room for any shortness of breath, wheezing, worsening itching, or any concerns

## 2023-01-15 NOTE — ED Provider Notes (Signed)
[facility]  ED Primary Provider Note        Arrival: The patient arrived by Private Vehicle     History of Present Illness   chief complaint  Jennifer [name] is a 47 y.o. female who had concerns including Insect Bite.  43 female presents emergency room with a chief complaint of having gnats fly at her.  Patient states just prior to arrival she was sitting for another individual and opened the microwave in the house and gnats came at her.  She states her arms feel itchy.  She also states "they flew at my eyes".  All nursing notes reviewed    Review of Systems     No other overt Review of Systems are noted to be positive except noted in the HPI.      Historical Data   History Reviewed This Encounter: Medical History  Surgical History  Family History  Social History      Physical Exam   ED Triage Vitals [01/15/23 1859]   BP (Non-Invasive) (!) 179/106   Heart Rate (!) 114   Respiratory Rate 20   Temperature 36.4 C (97.5 F)   SpO2 100 %   Weight 104 kg (230 lb)   Height 1.702 m (5\' 7" )         Exam:  Constitutional:  Patient alert orient x3 anxious.  No limitations.  Head: Atraumatic normocephalic  Eyes :  Pupils are equal round reactive to light and accommodation extraocular muscles are intact.  Sclera and conjunctiva are unremarkable.  Ears:  Tympanic membranes are pearly gray bilaterally; external auditory canals are unremarkable; external ears without any lesions  Nose:  Nares are patent turbinates are pink and moist  Mouth:  Mucosa is pink and moist without lesions.  Posterior pharynx is pink and moist without hypertrophy/exudate.  Neck:  Soft and supple without palpable lymphadenopathy.  Heart:  Regular rate and rhythm without audible murmur  Lungs:  Clear to auscultation bilaterally without any wheezing/rales/rhonchi  Abdomen:  Soft nontender without any rebound or guarding; positive bowel sounds throughout  Genitalia:  Deferred  Skin:  Warm and dry without lesions.   Normal skin turgor.  Brisk capillary refill distally  Extremities:  Good strength bilaterally with full range of motion of upper and lower extremities.  Neuro:  Alert oriented x3.  Cranial nerves II-XII grossly intact as tested.  Excellent sensation distally over all dermatomes.  Psychiatric:  Patient cooperative, anxious affect        Procedures           Patient Data   Labs Ordered/Reviewed - No data to display    No orders to display       Medical Decision Making          Medical Decision Making  Patient has no appreciable rash.  Eyes were examined closely.  No erythema, no injection, no foreign body.  Patient has no welts.  Patient given Benadryl 25 mg p.o..  Patient was discharged home advised to take Benadryl 25 mg orally every 6 hours as needed for itching or redness.    Risk  OTC drugs.    Critical Care  Total time providing critical care: 0 minutes               Medications Administered in the ED   diphenhydrAMINE (BENADRYL) capsule (25 mg Oral Given 01/15/23 1911)       Following the history, physical exam, and ED workup, the patient  was deemed stable and suitable for discharge. The patient/caregiver was advised to return to the ED for any new or worsening symptoms. Discharge medications, and follow-up instructions were discussed with the patient/caregiver in detail, who verbalizes understanding. The patient/caregiver is in agreement and is comfortable with the plan of care.    Disposition: Discharged         Current Discharge Medication List        CONTINUE these medications - NO CHANGES were made during your visit.        Details   albuterol sulfate 90 mcg/actuation oral inhaler  Commonly known as: PROVENTIL or VENTOLIN or PROAIR   1-2 Puffs, Inhalation, EVERY 6 HOURS PRN  Refills: 0     ARIPiprazole 10 mg Tablet  Commonly known as: ABILIFY   10 mg, Oral, DAILY  Refills: 0     atorvastatin 20 mg Tablet  Commonly known as: LIPITOR   20 mg, Oral, EVERY EVENING  Refills: 0     BYETTA SUBQ    Subcutaneous  Refills: 0     cefuroxime 500 mg Tablet  Commonly known as: CEFTIN   500 mg, Oral, 2 TIMES DAILY  Qty: 20 Tablet  Refills: 0     cetirizine 10 mg Tablet  Commonly known as: zyrTEC   10 mg, Oral, DAILY  Refills: 0     dicyclomine 10 mg Capsule  Commonly known as: BENTYL   10 mg, Oral, 4 TIMES DAILY  Qty: 20 Capsule  Refills: 0     famotidine 40 mg Tablet  Commonly known as: PEPCID   40 mg, Oral, 2 TIMES DAILY  Qty: 20 Tablet  Refills: 0     fluconazole 150 mg Tablet  Commonly known as: DIFLUCAN   150 mg, Oral, ONCE  Refills: 0     FLUoxetine 10 mg Capsule  Commonly known as: PROzac   10 mg, Oral, DAILY  Refills: 0     * gabapentin 300 mg Capsule  Commonly known as: NEURONTIN   300 mg, Oral  Refills: 0     * gabapentin 300 mg Capsule  Commonly known as: NEURONTIN   900 mg, Oral, 2 TIMES DAILY  Qty: 18 Capsule  Refills: 0     insulin aspart U-100 100 unit/mL (3 mL) Insulin Pen  Commonly known as: NovoLOG Flexpen U-100 Insulin   8 Units, Subcutaneous, 3 TIMES DAILY BEFORE MEALS  Qty: 15 mL  Refills: 0     insulin detemir U-100 100 unit/mL (3 mL) Insulin Pen  Commonly known as: LEVEMIR   40 Units, Subcutaneous, EVERY EVENING  Refills: 0     Jardiance 10 mg Tablet  Generic drug: empagliflozin   10 mg, Oral, DAILY  Refills: 0     * ketorolac tromethamine 10 mg Tablet  Commonly known as: TORADOL   10 mg, Oral, EVERY 6 HOURS PRN  Qty: 20 Tablet  Refills: 0     * ketorolac tromethamine 10 mg Tablet  Commonly known as: TORADOL   10 mg, Oral, EVERY 6 HOURS PRN  Qty: 12 Tablet  Refills: 0     levothyroxine 125 mcg Tablet  Commonly known as: SYNTHROID   125 mcg, Oral, EVERY MORNING  Qty: 30 Tablet  Refills: 0     lisinopriL 10 mg Tablet  Commonly known as: PRINIVIL   10 mg, Oral, DAILY  Refills: 0     loperamide 2 mg Capsule  Commonly known as: IMODIUM   2 mg, Oral, EVERY 4 HOURS  PRN  Qty: 30 Capsule  Refills: 0     * metFORMIN 500 mg Tablet  Commonly known as: GLUCOPHAGE   1,000 mg, Oral, 2 TIMES DAILY  Refills:  0     * MetFORMIN 1,000 mg Tablet  Commonly known as: GLUCOPHAGE   1,000 mg, Oral, 2 TIMES DAILY WITH FOOD  Qty: 180 Tablet  Refills: 4     Methylphenidate 20 mg Tablet  Commonly known as: RITALIN   20 mg, Oral, 4 TIMES DAILY  Refills: 0     montelukast 10 mg Tablet  Commonly known as: SINGULAIR   10 mg, Oral, EVERY EVENING  Refills: 0     naproxen 500 mg Tablet  Commonly known as: NAPROSYN   500 mg, Oral, 2 TIMES DAILY WITH FOOD  Refills: 0     omeprazole 40 mg Capsule, Delayed Release(E.C.)  Commonly known as: PRILOSEC   40 mg, Oral, 2 TIMES DAILY  Refills: 0     * ondansetron 4 mg Tablet, Rapid Dissolve  Commonly known as: ZOFRAN ODT   4 mg, Oral, EVERY 8 HOURS PRN  Qty: 12 Tablet  Refills: 0     * ondansetron 4 mg Tablet, Rapid Dissolve  Commonly known as: ZOFRAN ODT   4 mg, Oral, EVERY 8 HOURS PRN  Qty: 12 Tablet  Refills: 0     phenazopyridine 200 mg Tablet  Commonly known as: PYRIDIUM   200 mg, Oral, 3 TIMES DAILY  Qty: 9 Tablet  Refills: 0           * This list has 8 medication(s) that are the same as other medications prescribed for you. Read the directions carefully, and ask your doctor or other care provider to review them with you.                Follow up:   Lilia Pro, FNP  [address]  [place] [address]  [phone]    In 2 days                   Clinical Impression   Pruritus (Primary)         Current Discharge Medication List          R.A. Santiago Bumpers, DO  Department of Emergency Medicine

## 2023-01-15 NOTE — ED Nurses Note (Signed)
Patient D/C home at this time. Instructions reviewed and understanding verbalized. Patient left department via ambulation

## 2023-01-15 NOTE — ED Triage Notes (Signed)
Bug bites while sitting with patient a few minutes ago.  Gnats swarming out of microwave, feels like some got in her eyes.

## 2023-01-16 NOTE — ED Notes (Signed)
Jennifer [name]  Peer Recovery Coach Assessment    Initial Evaluation  Referred by:: Nurse  Location of Evaluation: Emergency Department  How many times in the last 12 months have you been to the ED?: 6 or more  Have you ever served or are you currently serving in the Armed Forces?: No             Substance Use History  Patient current substance use status: Patient states she drinks daily and smokes marijuana for pain. Patient did not want any treatment at this time.    Prior treatment history?: No    Currently enrolled in substance use program?: No    Within the last 30 days, what substances has the patient used?: Marijuana, Alcohol  Patient's age at first substance use?: 89 or older  Drug route of administration: Oral, Smoked    Has the patient ever had sustained abstinence?: No              Family, Social, Home & Safety History  Marital Status: Single            Need to improve relationships with family?: No    Social network: Immediate family, Non-substance using peers/friends/other, Substance using peers    Current living situation: Independent  Any help needed with the following?: None  Contact phone number for the patient: [phone]  Emergency contact name and phone number: Jennifer [name], mother, [phone]    Has the patient had any legal issues within the past 30 days?: None         Employment  Current employment status: Employed  Occupation: Scientist, 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    Brief Intervention  Discussed plan to reduce/quit substance use?: Yes  Discussed willingness to enter treatment?: Yes  Indicated patient's stage of change:: 1 - Precontemplation    Patient seen by Peer Recovery Coach and is a candidate for buprenorphine  administration in the ED. Patient needs assessment for bup treatment.: No    Plan  Was the patient referred to treatment?: No    Was patient referred to physician for Buprenorphine Assessment in the ED?: No    Did patient receive Narcan in the ED?: No    Plan: Additional Comments: Patient is not interested in outpatient or inpatient alcohol detox at this time. [facility] did discuss safe marijuana practices, harm reduction and long term use with patient.    Follow-up           Need for additional follow-up?: No       Harle Battiest, Peer Recovery Coach 01/16/2023 11:39

## 2023-03-09 ENCOUNTER — Emergency Department: Admission: EM | Admit: 2023-03-09 | Discharge: 2023-03-09 | Disposition: A | Discharge status: Home / Self Care

## 2023-03-09 ENCOUNTER — Other Ambulatory Visit: Payer: Self-pay

## 2023-03-09 ENCOUNTER — Encounter (HOSPITAL_COMMUNITY): Payer: Self-pay

## 2023-03-09 ENCOUNTER — Emergency Department (HOSPITAL_COMMUNITY)

## 2023-03-09 DIAGNOSIS — Z1152 Encounter for screening for COVID-19: Secondary | ICD-10-CM | POA: Insufficient documentation

## 2023-03-09 DIAGNOSIS — Z87891 Personal history of nicotine dependence: Secondary | ICD-10-CM | POA: Insufficient documentation

## 2023-03-09 DIAGNOSIS — J441 Chronic obstructive pulmonary disease with (acute) exacerbation: Secondary | ICD-10-CM | POA: Insufficient documentation

## 2023-03-09 DIAGNOSIS — L732 Hidradenitis suppurativa: Secondary | ICD-10-CM | POA: Insufficient documentation

## 2023-03-09 DIAGNOSIS — R9431 Abnormal electrocardiogram [ECG] [EKG]: Secondary | ICD-10-CM | POA: Insufficient documentation

## 2023-03-09 LAB — URINALYSIS, MACROSCOPIC
BILIRUBIN: NEGATIVE mg/dL
BLOOD: 0.03 mg/dL
GLUCOSE: 50 mg/dL — AB
LEUKOCYTES: NEGATIVE WBCs/uL
NITRITE: NEGATIVE
PH: 6 (ref 5.0–9.0)
PROTEIN: 70 mg/dL — AB
SPECIFIC GRAVITY: 1.03 (ref 1.002–1.030)
UROBILINOGEN: 3 mg/dL — AB

## 2023-03-09 LAB — CBC WITH DIFF
BASOPHIL #: 0.1 10*3/uL (ref 0.00–0.10)
BASOPHIL %: 1 % (ref 0–1)
EOSINOPHIL #: 0.1 10*3/uL (ref 0.00–0.50)
EOSINOPHIL %: 1 % (ref 1–7)
HCT: 36.6 % (ref 31.2–41.9)
HGB: 11.9 g/dL (ref 10.9–14.3)
LYMPHOCYTE #: 2 10*3/uL (ref 1.00–3.00)
LYMPHOCYTE %: 24 % (ref 16–44)
MCH: 27.1 pg (ref 24.7–32.8)
MCHC: 32.4 g/dL (ref 32.3–35.6)
MCV: 83.5 fL (ref 75.5–95.3)
MONOCYTE #: 0.6 10*3/uL (ref 0.30–1.00)
MONOCYTE %: 7 % (ref 5–13)
MPV: 9.6 fL (ref 7.9–10.8)
NEUTROPHIL #: 5.8 10*3/uL (ref 1.85–7.80)
NEUTROPHIL %: 68 % (ref 43–77)
PLATELETS: 249 10*3/uL (ref 140–440)
RBC: 4.39 10*6/uL (ref 3.63–4.92)
RDW: 14.3 % (ref 12.3–17.7)
WBC: 8.6 10*3/uL (ref 3.8–11.8)

## 2023-03-09 LAB — COMPREHENSIVE METABOLIC PANEL, NON-FASTING
ALBUMIN/GLOBULIN RATIO: 1.4 (ref 0.8–1.4)
ALBUMIN: 3.9 g/dL (ref 3.5–5.7)
ALKALINE PHOSPHATASE: 89 U/L (ref 34–104)
ALT (SGPT): 22 U/L (ref 7–52)
ANION GAP: 8 mmol/L (ref 4–13)
AST (SGOT): 21 U/L (ref 13–39)
BILIRUBIN TOTAL: 0.4 mg/dL (ref 0.3–1.0)
BUN/CREA RATIO: 23 — ABNORMAL HIGH (ref 6–22)
BUN: 17 mg/dL (ref 7–25)
CALCIUM, CORRECTED: 9 mg/dL (ref 8.9–10.8)
CALCIUM: 8.9 mg/dL (ref 8.6–10.3)
CHLORIDE: 104 mmol/L (ref 98–107)
CO2 TOTAL: 27 mmol/L (ref 21–31)
CREATININE: 0.73 mg/dL (ref 0.60–1.30)
ESTIMATED GFR: 102 mL/min/{1.73_m2} (ref >59)
GLOBULIN: 2.8 — ABNORMAL LOW (ref 2.9–5.4)
GLUCOSE: 123 mg/dL — ABNORMAL HIGH (ref 74–109)
OSMOLALITY, CALCULATED: 281 mosm/kg (ref 270–290)
POTASSIUM: 3.3 mmol/L — ABNORMAL LOW (ref 3.5–5.1)
PROTEIN TOTAL: 6.7 g/dL (ref 6.4–8.9)
SODIUM: 139 mmol/L (ref 136–145)

## 2023-03-09 LAB — URINALYSIS, MICROSCOPIC
BACTERIA: NEGATIVE /[HPF]
RBCS: 6 /[HPF] — ABNORMAL HIGH (ref <4)
SQUAMOUS EPITHELIAL: 23 /[HPF] (ref <28)
WBCS: 1 /[HPF] (ref <6)

## 2023-03-09 LAB — GOLD TOP TUBE

## 2023-03-09 LAB — COVID-19, FLU A/B, RSV RAPID BY PCR
INFLUENZA VIRUS TYPE A: NOT DETECTED
INFLUENZA VIRUS TYPE B: NOT DETECTED
RESPIRATORY SYNCTIAL VIRUS (RSV): NOT DETECTED
SARS-CoV-2: NOT DETECTED

## 2023-03-09 LAB — BLUE TOP TUBE

## 2023-03-09 LAB — LACTIC ACID LEVEL W/ REFLEX FOR LEVEL >2.0: LACTIC ACID: 1.2 mmol/L (ref 0.5–2.2)

## 2023-03-09 MED ORDER — METHYLPREDNISOLONE SOD SUCC 125 MG SOLUTION FOR INJECTION WRAPPER
125.0000 mg | INTRAVENOUS | Status: AC
Start: 2023-03-09 — End: 2023-03-09
  Administered 2023-03-09: 125 mg via INTRAVENOUS

## 2023-03-09 MED ORDER — POTASSIUM CHLORIDE ER 20 MEQ TABLET,EXTENDED RELEASE(PART/CRYST)
40.0000 meq | ORAL_TABLET | ORAL | Status: AC
Start: 2023-03-09 — End: 2023-03-09
  Administered 2023-03-09: 40 meq via ORAL

## 2023-03-09 MED ORDER — METHYLPREDNISOLONE SOD SUCC 125 MG SOLUTION FOR INJECTION WRAPPER
INTRAVENOUS | Status: AC
Start: 2023-03-09 — End: 2023-03-09
  Filled 2023-03-09: qty 2

## 2023-03-09 MED ORDER — PREDNISONE 20 MG TABLET
20.0000 mg | ORAL_TABLET | Freq: Every day | ORAL | 0 refills | Status: AC
Start: 2023-03-09 — End: 2023-03-13

## 2023-03-09 MED ORDER — POTASSIUM CHLORIDE ER 20 MEQ TABLET,EXTENDED RELEASE(PART/CRYST)
ORAL_TABLET | ORAL | Status: AC
Start: 2023-03-09 — End: 2023-03-09
  Filled 2023-03-09: qty 2

## 2023-03-09 NOTE — ED Nurses Note (Signed)
Patient here today with c/o of shortness of breath x couple of days that has become worse. Patient states that she has been without her nebulizer and CPAP machine. Patient states that she feels like phlegm is in her chest, but will not come up.

## 2023-03-09 NOTE — ED Triage Notes (Signed)
Shortness of breath for the past couple of days, pt reports that she has been left without medication and CPAP machine after separating from previous boyfriend.     [facility]: duoneb x1

## 2023-03-09 NOTE — ED Nurses Note (Signed)
Patient discharged home with family.  AVS reviewed with patient/care giver.  A written copy of the AVS and discharge instructions was given to the patient/care giver.  Questions sufficiently answered as needed.  Patient/care giver encouraged to follow up with PCP as indicated.  In the event of an emergency, patient/care giver instructed to call 911 or go to the nearest emergency room.

## 2023-03-09 NOTE — Discharge Instructions (Addendum)
Drink plenty of fluids. Continue any at home medications as previously prescribed. Take medications that are prescribed from today's visit as prescribed. Discuss any questions you may have concerning your medications with your pharmacist. Follow up with your regular PCP in the next 2-3 days. Return to the ED if symptoms worsen, change, or do not improve.
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
## 2023-03-10 DIAGNOSIS — R9431 Abnormal electrocardiogram [ECG] [EKG]: Secondary | ICD-10-CM

## 2023-03-10 DIAGNOSIS — J989 Respiratory disorder, unspecified: Secondary | ICD-10-CM

## 2023-03-10 LAB — ECG 12 LEAD
Atrial Rate: 83 {beats}/min
Calculated P Axis: 80 degrees
Calculated R Axis: 17 degrees
Calculated T Axis: 29 degrees
PR Interval: 152 ms
QRS Duration: 86 ms
QT Interval: 416 ms
QTC Calculation: 488 ms
Ventricular rate: 83 {beats}/min

## 2023-04-24 ENCOUNTER — Other Ambulatory Visit: Payer: Self-pay

## 2023-04-24 ENCOUNTER — Emergency Department: Admission: EM | Admit: 2023-04-24 | Discharge: 2023-04-25 | Discharge status: Against Medical Advice

## 2023-04-24 DIAGNOSIS — Z5321 Procedure and treatment not carried out due to patient leaving prior to being seen by health care provider: Secondary | ICD-10-CM | POA: Insufficient documentation

## 2023-04-24 NOTE — ED Triage Notes (Signed)
Shortness of breath, chest tightness since 1900.

## 2023-06-22 ENCOUNTER — Observation Stay
Admission: EM | Admit: 2023-06-22 | Discharge: 2023-06-23 | Disposition: A | Discharge status: Home / Self Care | Attending: Internal Medicine | Admitting: Internal Medicine

## 2023-06-22 ENCOUNTER — Emergency Department (HOSPITAL_COMMUNITY)

## 2023-06-22 ENCOUNTER — Other Ambulatory Visit: Payer: Self-pay

## 2023-06-22 ENCOUNTER — Encounter (HOSPITAL_COMMUNITY): Payer: Self-pay

## 2023-06-22 DIAGNOSIS — Z79890 Hormone replacement therapy (postmenopausal): Secondary | ICD-10-CM | POA: Insufficient documentation

## 2023-06-22 DIAGNOSIS — I160 Hypertensive urgency: Secondary | ICD-10-CM

## 2023-06-22 DIAGNOSIS — I10 Essential (primary) hypertension: Secondary | ICD-10-CM | POA: Insufficient documentation

## 2023-06-22 DIAGNOSIS — J45901 Unspecified asthma with (acute) exacerbation: Secondary | ICD-10-CM

## 2023-06-22 DIAGNOSIS — Z87891 Personal history of nicotine dependence: Secondary | ICD-10-CM | POA: Insufficient documentation

## 2023-06-22 DIAGNOSIS — E119 Type 2 diabetes mellitus without complications: Secondary | ICD-10-CM | POA: Insufficient documentation

## 2023-06-22 DIAGNOSIS — M797 Fibromyalgia: Secondary | ICD-10-CM | POA: Insufficient documentation

## 2023-06-22 DIAGNOSIS — E079 Disorder of thyroid, unspecified: Secondary | ICD-10-CM | POA: Insufficient documentation

## 2023-06-22 DIAGNOSIS — Z7985 Long-term (current) use of injectable non-insulin antidiabetic drugs: Secondary | ICD-10-CM | POA: Insufficient documentation

## 2023-06-22 DIAGNOSIS — J189 Pneumonia, unspecified organism: Principal | ICD-10-CM | POA: Insufficient documentation

## 2023-06-22 DIAGNOSIS — J441 Chronic obstructive pulmonary disease with (acute) exacerbation: Secondary | ICD-10-CM | POA: Insufficient documentation

## 2023-06-22 DIAGNOSIS — J989 Respiratory disorder, unspecified: Secondary | ICD-10-CM

## 2023-06-22 DIAGNOSIS — Z794 Long term (current) use of insulin: Secondary | ICD-10-CM | POA: Insufficient documentation

## 2023-06-22 DIAGNOSIS — R0600 Dyspnea, unspecified: Secondary | ICD-10-CM

## 2023-06-22 DIAGNOSIS — R0602 Shortness of breath: Secondary | ICD-10-CM

## 2023-06-22 DIAGNOSIS — R9431 Abnormal electrocardiogram [ECG] [EKG]: Secondary | ICD-10-CM

## 2023-06-22 DIAGNOSIS — Z1152 Encounter for screening for COVID-19: Secondary | ICD-10-CM | POA: Insufficient documentation

## 2023-06-22 DIAGNOSIS — J440 Chronic obstructive pulmonary disease with acute lower respiratory infection: Secondary | ICD-10-CM | POA: Insufficient documentation

## 2023-06-22 DIAGNOSIS — Z79899 Other long term (current) drug therapy: Secondary | ICD-10-CM | POA: Insufficient documentation

## 2023-06-22 LAB — ARTERIAL BLOOD GAS/LACTATE
%FIO2 (ARTERIAL): 21 %
BASE DEFICIT: 0.3 mmol/L (ref 0.0–2.0)
BICARBONATE (ARTERIAL): 24.2 mmol/L (ref 20.0–26.0)
CARBOXYHEMOGLOBIN: 1.1 % (ref <=1.5)
LACTATE: 0.9 mmol/L (ref <=2.0)
MET-HEMOGLOBIN: 0.5 % (ref <=2.0)
O2CT: 12.7 %
OXYHEMOGLOBIN: 92.9 % (ref 88.0–100.0)
PCO2 (ARTERIAL): 38 mm[Hg] (ref 35–45)
PH (ARTERIAL): 7.41 (ref 7.35–7.45)
PO2 (ARTERIAL): 74 mm[Hg] — ABNORMAL LOW (ref 80–100)

## 2023-06-22 LAB — COMPREHENSIVE METABOLIC PANEL, NON-FASTING
ALBUMIN/GLOBULIN RATIO: 1.4 (ref 0.8–1.4)
ALBUMIN: 3.5 g/dL (ref 3.5–5.7)
ALKALINE PHOSPHATASE: 78 U/L (ref 34–104)
ALT (SGPT): 26 U/L (ref 7–52)
ANION GAP: 4 mmol/L (ref 4–13)
AST (SGOT): 24 U/L (ref 13–39)
BILIRUBIN TOTAL: 0.3 mg/dL (ref 0.3–1.0)
BUN/CREA RATIO: 19 (ref 6–22)
BUN: 13 mg/dL (ref 7–25)
CALCIUM, CORRECTED: 9 mg/dL (ref 8.9–10.8)
CALCIUM: 8.6 mg/dL (ref 8.6–10.3)
CHLORIDE: 105 mmol/L (ref 98–107)
CO2 TOTAL: 25 mmol/L (ref 21–31)
CREATININE: 0.68 mg/dL (ref 0.60–1.30)
ESTIMATED GFR: 107 mL/min/{1.73_m2} (ref >59)
GLOBULIN: 2.5 (ref 2.0–3.5)
GLUCOSE: 265 mg/dL — ABNORMAL HIGH (ref 74–109)
OSMOLALITY, CALCULATED: 278 mosm/kg (ref 270–290)
POTASSIUM: 3.9 mmol/L (ref 3.5–5.1)
PROTEIN TOTAL: 6 g/dL — ABNORMAL LOW (ref 6.4–8.9)
SODIUM: 134 mmol/L — ABNORMAL LOW (ref 136–145)

## 2023-06-22 LAB — IRON TRANSFERRIN AND TIBC
IRON (TRANSFERRIN) SATURATION: 5 % — ABNORMAL LOW (ref 15–50)
IRON: 20 ug/dL — ABNORMAL LOW (ref 50–212)
TOTAL IRON BINDING CAPACITY: 438 ug/dL (ref 250–450)
TRANSFERRIN: 313 mg/dL (ref 203–362)
UIBC: 418 ug/dL — ABNORMAL HIGH (ref 130–375)

## 2023-06-22 LAB — CBC WITH DIFF
BASOPHIL #: 0.1 10*3/uL (ref 0.00–0.10)
BASOPHIL %: 1 % (ref 0–1)
EOSINOPHIL #: 0.1 10*3/uL (ref 0.00–0.50)
EOSINOPHIL %: 2 % (ref 1–7)
HCT: 31.7 % (ref 31.2–41.9)
HGB: 10.2 g/dL — ABNORMAL LOW (ref 10.9–14.3)
LYMPHOCYTE #: 1.9 10*3/uL (ref 1.00–3.00)
LYMPHOCYTE %: 26 % (ref 16–44)
MCH: 25.8 pg (ref 24.7–32.8)
MCHC: 32.3 g/dL (ref 32.3–35.6)
MCV: 79.9 fL (ref 75.5–95.3)
MONOCYTE #: 0.5 10*3/uL (ref 0.30–1.00)
MONOCYTE %: 7 % (ref 5–13)
MPV: 8.9 fL (ref 7.9–10.8)
NEUTROPHIL #: 4.8 10*3/uL (ref 1.85–7.80)
NEUTROPHIL %: 65 % (ref 43–77)
PLATELETS: 264 10*3/uL (ref 140–440)
RBC: 3.97 10*6/uL (ref 3.63–4.92)
RDW: 16.6 % (ref 12.3–17.7)
WBC: 7.4 10*3/uL (ref 3.8–11.8)

## 2023-06-22 LAB — TROPONIN-I: TROPONIN I: 9 ng/L (ref <15)

## 2023-06-22 LAB — ECG 12 LEAD
Atrial Rate: 85 {beats}/min
Calculated P Axis: 57 degrees
Calculated R Axis: 22 degrees
Calculated T Axis: 34 degrees
PR Interval: 158 ms
QRS Duration: 76 ms
QT Interval: 382 ms
QTC Calculation: 454 ms
Ventricular rate: 85 {beats}/min

## 2023-06-22 LAB — LACTIC ACID LEVEL W/ REFLEX FOR LEVEL >2.0: LACTIC ACID: 0.8 mmol/L (ref 0.5–2.2)

## 2023-06-22 LAB — POC BLOOD GLUCOSE (RESULTS)
GLUCOSE, POC: 343 mg/dL — ABNORMAL HIGH (ref 70–100)
GLUCOSE, POC: 332 mg/dL — ABNORMAL HIGH (ref 70–100)
GLUCOSE, POC: 326 mg/dL — ABNORMAL HIGH (ref 70–100)

## 2023-06-22 LAB — GOLD TOP TUBE

## 2023-06-22 LAB — COVID-19, FLU A/B, RSV RAPID BY PCR
INFLUENZA VIRUS TYPE A: NOT DETECTED
INFLUENZA VIRUS TYPE B: NOT DETECTED
RESPIRATORY SYNCTIAL VIRUS (RSV): NOT DETECTED
SARS-CoV-2: NOT DETECTED

## 2023-06-22 LAB — HGA1C (HEMOGLOBIN A1C WITH EST AVG GLUCOSE): HEMOGLOBIN A1C: 7.8 % — ABNORMAL HIGH (ref 4.0–6.0)

## 2023-06-22 LAB — FERRITIN: FERRITIN: 5 ng/mL — ABNORMAL LOW (ref 11–307)

## 2023-06-22 LAB — GRAY TOP TUBE

## 2023-06-22 LAB — LAVENDER TOP TUBE

## 2023-06-22 LAB — BLUE TOP TUBE

## 2023-06-22 MED ORDER — BENZONATATE 100 MG CAPSULE
100.0000 mg | ORAL_CAPSULE | Freq: Three times a day (TID) | ORAL | Status: DC | PRN
Start: 2023-06-22 — End: 2023-06-23
  Filled 2023-06-22: qty 1

## 2023-06-22 MED ORDER — NAPROXEN 250 MG TABLET
500.0000 mg | ORAL_TABLET | Freq: Two times a day (BID) | ORAL | Status: DC
Start: 2023-06-22 — End: 2023-06-23
  Administered 2023-06-22 – 2023-06-23 (×2): 500 mg via ORAL
  Filled 2023-06-22 (×2): qty 2

## 2023-06-22 MED ORDER — HYDRALAZINE 20 MG/ML INJECTION SOLUTION
10.0000 mg | INTRAMUSCULAR | Status: AC
Start: 2023-06-22 — End: 2023-06-22
  Administered 2023-06-22: 10 mg via INTRAVENOUS

## 2023-06-22 MED ORDER — METHYLPREDNISOLONE SOD SUCC 125 MG SOLUTION FOR INJECTION WRAPPER
INTRAVENOUS | Status: AC
Start: 2023-06-22 — End: 2023-06-22
  Filled 2023-06-22: qty 2

## 2023-06-22 MED ORDER — POLYETHYLENE GLYCOL 3350 17 GRAM ORAL POWDER PACKET
17.0000 g | Freq: Every day | ORAL | Status: DC
Start: 2023-06-22 — End: 2023-06-23
  Administered 2023-06-22 – 2023-06-23 (×2): 17 g via ORAL
  Filled 2023-06-22: qty 1

## 2023-06-22 MED ORDER — RAMELTEON 8 MG TABLET
8.0000 mg | ORAL_TABLET | Freq: Every evening | ORAL | Status: DC
Start: 2023-06-22 — End: 2023-06-23
  Administered 2023-06-22: 8 mg via ORAL
  Filled 2023-06-22: qty 1

## 2023-06-22 MED ORDER — BUDESONIDE-FORMOTEROL HFA 160 MCG-4.5 MCG/ACTUATION AEROSOL INHALER
2.0000 | INHALATION_SPRAY | Freq: Two times a day (BID) | RESPIRATORY_TRACT | Status: DC
Start: 2023-06-22 — End: 2023-06-23
  Administered 2023-06-22 – 2023-06-23 (×3): 2 via RESPIRATORY_TRACT

## 2023-06-22 MED ORDER — LABETALOL 5 MG/ML INTRAVENOUS SOLUTION
10.0000 mg | INTRAVENOUS | Status: DC | PRN
Start: 2023-06-22 — End: 2023-06-23
  Administered 2023-06-23: 10 mg via INTRAVENOUS
  Filled 2023-06-22: qty 20

## 2023-06-22 MED ORDER — SODIUM CHLORIDE 0.9 % INTRAVENOUS PIGGYBACK
2.0000 g | INTRAVENOUS | Status: DC
Start: 2023-06-22 — End: 2023-06-23
  Administered 2023-06-22: 2 g via INTRAVENOUS
  Administered 2023-06-22: 0 g via INTRAVENOUS
  Administered 2023-06-23: 2 g via INTRAVENOUS
  Administered 2023-06-23: 0 g via INTRAVENOUS
  Filled 2023-06-22: qty 20

## 2023-06-22 MED ORDER — GABAPENTIN 400 MG CAPSULE
800.0000 mg | ORAL_CAPSULE | Freq: Three times a day (TID) | ORAL | Status: DC
Start: 2023-06-22 — End: 2023-06-23
  Administered 2023-06-22 – 2023-06-23 (×4): 800 mg via ORAL
  Filled 2023-06-22 (×3): qty 2

## 2023-06-22 MED ORDER — CEFTRIAXONE 2 GRAM SOLUTION FOR INJECTION
INTRAMUSCULAR | Status: AC
Start: 2023-06-22 — End: 2023-06-22
  Filled 2023-06-22: qty 20

## 2023-06-22 MED ORDER — ONDANSETRON HCL (PF) 4 MG/2 ML INJECTION SOLUTION
4.0000 mg | Freq: Four times a day (QID) | INTRAMUSCULAR | Status: DC | PRN
Start: 2023-06-22 — End: 2023-06-23

## 2023-06-22 MED ORDER — POLYETHYLENE GLYCOL 3350 17 GRAM ORAL POWDER PACKET
ORAL | Status: AC
Start: 2023-06-22 — End: 2023-06-22
  Filled 2023-06-22: qty 1

## 2023-06-22 MED ORDER — MONTELUKAST 10 MG TABLET
10.0000 mg | ORAL_TABLET | Freq: Every evening | ORAL | Status: DC
Start: 2023-06-22 — End: 2023-06-23
  Administered 2023-06-22: 10 mg via ORAL
  Filled 2023-06-22: qty 1

## 2023-06-22 MED ORDER — IPRATROPIUM 0.5 MG-ALBUTEROL 3 MG (2.5 MG BASE)/3 ML NEBULIZATION SOLN
3.0000 mL | INHALATION_SOLUTION | Freq: Four times a day (QID) | RESPIRATORY_TRACT | Status: DC
Start: 2023-06-22 — End: 2023-06-23
  Administered 2023-06-22 – 2023-06-23 (×3): 3 mL via RESPIRATORY_TRACT
  Administered 2023-06-23: 0 mL via RESPIRATORY_TRACT
  Administered 2023-06-23 (×2): 3 mL via RESPIRATORY_TRACT

## 2023-06-22 MED ORDER — AZITHROMYCIN 250 MG TABLET
500.0000 mg | ORAL_TABLET | ORAL | Status: AC
Start: 2023-06-22 — End: 2023-06-22
  Administered 2023-06-22: 500 mg via ORAL

## 2023-06-22 MED ORDER — LISINOPRIL 20 MG-HYDROCHLOROTHIAZIDE 12.5 MG TABLET
1.0000 | ORAL_TABLET | Freq: Every day | ORAL | Status: DC
Start: 2023-06-22 — End: 2023-06-22

## 2023-06-22 MED ORDER — PANTOPRAZOLE 40 MG TABLET,DELAYED RELEASE
DELAYED_RELEASE_TABLET | ORAL | Status: AC
Start: 2023-06-22 — End: 2023-06-22
  Filled 2023-06-22: qty 1

## 2023-06-22 MED ORDER — LISINOPRIL 20 MG TABLET
20.0000 mg | ORAL_TABLET | Freq: Every day | ORAL | Status: DC
Start: 2023-06-22 — End: 2023-06-23
  Administered 2023-06-22: 10 mg via ORAL
  Administered 2023-06-23: 20 mg via ORAL
  Filled 2023-06-22: qty 1

## 2023-06-22 MED ORDER — METHYLPREDNISOLONE SOD SUCCINATE 40 MG/ML SOLUTION FOR INJ. WRAPPER
40.0000 mg | Freq: Every day | INTRAMUSCULAR | Status: DC
Start: 2023-06-23 — End: 2023-06-23
  Administered 2023-06-23: 40 mg via INTRAVENOUS
  Filled 2023-06-22: qty 1

## 2023-06-22 MED ORDER — LISINOPRIL 5 MG TABLET
10.0000 mg | ORAL_TABLET | ORAL | Status: AC
Start: 2023-06-22 — End: 2023-06-22
  Administered 2023-06-22: 10 mg via ORAL

## 2023-06-22 MED ORDER — LISINOPRIL 5 MG TABLET
ORAL_TABLET | ORAL | Status: AC
Start: 2023-06-22 — End: 2023-06-22
  Filled 2023-06-22: qty 2

## 2023-06-22 MED ORDER — IPRATROPIUM 0.5 MG-ALBUTEROL 3 MG (2.5 MG BASE)/3 ML NEBULIZATION SOLN
3.0000 mL | INHALATION_SOLUTION | RESPIRATORY_TRACT | Status: AC
Start: 2023-06-22 — End: 2023-06-22
  Administered 2023-06-22: 3 mL via RESPIRATORY_TRACT

## 2023-06-22 MED ORDER — BENZONATATE 100 MG CAPSULE
100.0000 mg | ORAL_CAPSULE | ORAL | Status: AC
Start: 2023-06-22 — End: 2023-06-22
  Administered 2023-06-22: 100 mg via ORAL

## 2023-06-22 MED ORDER — HYDRALAZINE 20 MG/ML INJECTION SOLUTION
INTRAMUSCULAR | Status: AC
Start: 2023-06-22 — End: 2023-06-22
  Filled 2023-06-22: qty 1

## 2023-06-22 MED ORDER — DEXTROSE 40 % ORAL GEL
15.0000 g | ORAL | Status: DC | PRN
Start: 2023-06-22 — End: 2023-06-23

## 2023-06-22 MED ORDER — GABAPENTIN 400 MG CAPSULE
ORAL_CAPSULE | ORAL | Status: AC
Start: 2023-06-22 — End: 2023-06-22
  Filled 2023-06-22: qty 2

## 2023-06-22 MED ORDER — HEPARIN (PORCINE) 5,000 UNIT/ML INJECTION SOLUTION
INTRAMUSCULAR | Status: AC
Start: 2023-06-22 — End: 2023-06-22
  Filled 2023-06-22: qty 1

## 2023-06-22 MED ORDER — GABAPENTIN 400 MG CAPSULE
800.0000 mg | ORAL_CAPSULE | ORAL | Status: AC
Start: 2023-06-22 — End: 2023-06-22
  Administered 2023-06-22: 800 mg via ORAL

## 2023-06-22 MED ORDER — THYROID (PORK) 60 MG TABLET
240.0000 mg | ORAL_TABLET | Freq: Every day | ORAL | Status: DC
Start: 2023-06-22 — End: 2023-06-23
  Administered 2023-06-22 – 2023-06-23 (×2): 240 mg via ORAL
  Filled 2023-06-22 (×4): qty 4

## 2023-06-22 MED ORDER — GLUCAGON 1 MG/ML SOLUTION FOR INJECTION
1.0000 mg | Freq: Once | INTRAMUSCULAR | Status: DC | PRN
Start: 2023-06-22 — End: 2023-06-23

## 2023-06-22 MED ORDER — HYDROCHLOROTHIAZIDE 25 MG TABLET
12.5000 mg | ORAL_TABLET | Freq: Every day | ORAL | Status: DC
Start: 2023-06-22 — End: 2023-06-23
  Administered 2023-06-22 – 2023-06-23 (×2): 12.5 mg via ORAL
  Filled 2023-06-22: qty 1

## 2023-06-22 MED ORDER — DEXTROSE 50 % IN WATER (D50W) INTRAVENOUS SYRINGE
12.5000 g | INJECTION | INTRAVENOUS | Status: DC | PRN
Start: 2023-06-22 — End: 2023-06-23

## 2023-06-22 MED ORDER — SODIUM CHLORIDE 0.9 % INTRAVENOUS SOLUTION
500.0000 mg | INTRAVENOUS | Status: DC
Start: 2023-06-23 — End: 2023-06-26
  Administered 2023-06-23: 500 mg via INTRAVENOUS
  Administered 2023-06-23: 0 mg via INTRAVENOUS
  Filled 2023-06-22: qty 5

## 2023-06-22 MED ORDER — HYDROCHLOROTHIAZIDE 25 MG TABLET
ORAL_TABLET | ORAL | Status: AC
Start: 2023-06-22 — End: 2023-06-22
  Filled 2023-06-22: qty 1

## 2023-06-22 MED ORDER — ATORVASTATIN 10 MG TABLET
20.0000 mg | ORAL_TABLET | Freq: Every evening | ORAL | Status: DC
Start: 2023-06-22 — End: 2023-06-23
  Administered 2023-06-22: 20 mg via ORAL
  Filled 2023-06-22: qty 2

## 2023-06-22 MED ORDER — SODIUM CHLORIDE 0.9 % INTRAVENOUS SOLUTION
500.0000 mg | INTRAVENOUS | Status: DC
Start: 2023-06-22 — End: 2023-06-22

## 2023-06-22 MED ORDER — PANTOPRAZOLE 40 MG TABLET,DELAYED RELEASE
40.0000 mg | DELAYED_RELEASE_TABLET | Freq: Every day | ORAL | Status: DC
Start: 2023-06-22 — End: 2023-06-23
  Administered 2023-06-22 – 2023-06-23 (×2): 40 mg via ORAL
  Filled 2023-06-22: qty 1

## 2023-06-22 MED ORDER — INSULIN LISPRO 100 UNIT/ML SUB-Q SSIP VIAL
3.0000 [IU] | INJECTION | Freq: Four times a day (QID) | SUBCUTANEOUS | Status: DC
Start: 2023-06-22 — End: 2023-06-23
  Administered 2023-06-22 – 2023-06-23 (×4): 11 [IU] via SUBCUTANEOUS
  Administered 2023-06-23: 8 [IU] via SUBCUTANEOUS
  Filled 2023-06-22: qty 8
  Filled 2023-06-22: qty 11
  Filled 2023-06-22: qty 5

## 2023-06-22 MED ORDER — AZITHROMYCIN 250 MG TABLET
ORAL_TABLET | ORAL | Status: AC
Start: 2023-06-22 — End: 2023-06-22
  Filled 2023-06-22: qty 2

## 2023-06-22 MED ORDER — INSULIN LISPRO 100 UNIT/ML SUB-Q - CHARGE BY DOSE
SUBCUTANEOUS | Status: AC
Start: 2023-06-22 — End: 2023-06-22
  Filled 2023-06-22: qty 11

## 2023-06-22 MED ORDER — SODIUM CHLORIDE 0.9 % INTRAVENOUS PIGGYBACK
INJECTION | INTRAVENOUS | Status: AC
Start: 2023-06-22 — End: 2023-06-22
  Filled 2023-06-22: qty 50

## 2023-06-22 MED ORDER — HEPARIN (PORCINE) 5,000 UNIT/ML INJECTION SOLUTION
5000.0000 [IU] | Freq: Three times a day (TID) | INTRAMUSCULAR | Status: DC
Start: 2023-06-22 — End: 2023-06-23
  Administered 2023-06-22 – 2023-06-23 (×4): 5000 [IU] via SUBCUTANEOUS
  Filled 2023-06-22 (×3): qty 1

## 2023-06-22 MED ORDER — ALBUTEROL SULFATE HFA 90 MCG/ACTUATION AEROSOL INHALER
2.0000 | INHALATION_SPRAY | Freq: Four times a day (QID) | RESPIRATORY_TRACT | Status: DC | PRN
Start: 2023-06-22 — End: 2023-06-23

## 2023-06-22 MED ORDER — METHYLPREDNISOLONE SOD SUCC 125 MG SOLUTION FOR INJECTION WRAPPER
125.0000 mg | INTRAVENOUS | Status: AC
Start: 2023-06-22 — End: 2023-06-22
  Administered 2023-06-22: 125 mg via INTRAVENOUS

## 2023-06-22 MED ORDER — BENZONATATE 100 MG CAPSULE
ORAL_CAPSULE | ORAL | Status: AC
Start: 2023-06-22 — End: 2023-06-22
  Filled 2023-06-22: qty 1

## 2023-06-22 NOTE — ED Nurses Note (Signed)
Patient resting on stretcher in position of most comfort. Patient denies needs at present. Reports feeling Sob since last night with a  cough, states her grandchildren were with her yesterday and sick. Patient felt more SOB this morning and took a breathing tx without relief, so she came to the ER. Hx COPD.

## 2023-06-22 NOTE — Nurses Notes (Signed)
Pt BP 182/86. Refused PRN Labetalol. Pt talking on phone, tearful. States she wants something for her cough. Provider ordered medication for cough, and pt refused it. Medication given for sleep and headache as pt requested. Pt complaining she "does not think the medications are good enough, I've been asking since 2 pm for headache medicine" and to tell the doctor she "wants to go home tomorrow."   Pt eating [employer]. Pt refused diabetic diet teaching. States she eats whatever she wants and will not drink diet soda or water.   Will continue to monitor.

## 2023-06-22 NOTE — ED Triage Notes (Addendum)
To ED via [facility] with c/o SOB. States she woke up SOB. Pt took home duoneb prior to EMS arrival.     Hx of COPD, ASTHMA

## 2023-06-22 NOTE — ED Nurses Note (Signed)
Patient sitting on stretcher eating lunch, denies needs. Patient is talking on the phone to family. Resp even and unlabored.

## 2023-06-22 NOTE — Care Plan (Signed)
Problem: Adult Inpatient Plan of Care  Goal: Plan of Care Review  Reactivated  Goal: Patient-Specific Goal (Individualized)  Reactivated  Goal: Absence of Hospital-Acquired Illness or Injury  Reactivated  Goal: Optimal Comfort and Wellbeing  Reactivated  Goal: Rounds/Family Conference  Reactivated     Problem: Fall Injury Risk  Goal: Absence of Fall and Fall-Related Injury  Reactivated     Problem: Breathing Pattern Ineffective  Goal: Effective Breathing Pattern  Reactivated     Problem: Asthma Comorbidity  Goal: Maintenance of Asthma Control  Reactivated     Problem: COPD (Chronic Obstructive Pulmonary Disease) Comorbidity  Goal: Maintenance of COPD Symptom Control  Reactivated     Problem: Diabetes Comorbidity  Goal: Blood Glucose Level Within Targeted Range  Reactivated     Problem: Hypertension Comorbidity  Goal: Blood Pressure in Desired Range  Reactivated     Problem: Pain Chronic (Persistent) (Comorbidity Management)  Goal: Acceptable Pain Control and Functional Ability  Reactivated     Patient admitted to room 318B. Alert and oriented, Room Air. Hx COPD and Asthma. Admission and assessment completed at this time.

## 2023-06-22 NOTE — ED Provider Notes (Signed)
[facility]  ED Primary Provider Note  Patient Name: Jennifer [name]  Patient Age: 48 y.o.  Date of Birth: [birth year]    Chief Complaint: Shortness of Breath        History of Present Illness       Jennifer [name] is a 48 y.o. female who had concerns including Shortness of Breath.  Patient presents to ED via EMS who report shortness of breath.  Patient states that she has shortness of breath that woke her up from asleep started this morning.  She reports having taken a DuoNeb prior to EMS arrival.  She also endorses history of chronic obstructive pulmonary disease and asthma.  Patient has increased work breathing on arrival.  Wheezing heard throughout lung fields.  Patient denies fever chills nausea vomiting chest pain.      History provided by:  Patient  Shortness of Breath  Severity:  Moderate  Onset quality:  Sudden  Duration:  2 hours  Timing:  Constant  Progression:  Worsening  Context: activity    Relieved by:  None tried  Worsened by:  Exertion, deep breathing, activity, movement and weather changes  Ineffective treatments:  None tried  Associated symptoms: wheezing    Associated symptoms: no abdominal pain, no chest pain and no vomiting         Review of Systems     No other overt Review of Systems are noted to be positive except noted in the HPI.      Historical Data   History Reviewed This Encounter: Medical History  Surgical History  Family History  Social History      Physical Exam   ED Triage Vitals [06/22/23 0633]   BP (Non-Invasive) (!) 174/72   Heart Rate 98   Respiratory Rate 16   Temperature 36.8 C (98.2 F)   SpO2 99 %   Weight 104 kg (230 lb)   Height 1.702 m (5\' 7" )       Physical Exam  Vitals and nursing note reviewed.   Constitutional:       Appearance: Normal appearance. She is normal weight.   HENT:      Head: Normocephalic and atraumatic.      Mouth/Throat:      Mouth: Mucous membranes are moist.      Pharynx: Oropharynx is clear.   Eyes:      Pupils:  Pupils are equal, round, and reactive to light.   Cardiovascular:      Rate and Rhythm: Normal rate and regular rhythm.      Pulses: Normal pulses.      Heart sounds: Normal heart sounds.   Pulmonary:      Effort: Respiratory distress present.      Breath sounds: Examination of the right-upper field reveals wheezing. Examination of the left-upper field reveals wheezing. Examination of the right-middle field reveals rhonchi. Examination of the left-middle field reveals rhonchi. Examination of the right-lower field reveals wheezing. Examination of the left-lower field reveals wheezing. Wheezing and rhonchi present.   Abdominal:      General: Abdomen is flat. Bowel sounds are normal.      Palpations: Abdomen is soft.   Musculoskeletal:         General: Normal range of motion.      Cervical back: Normal range of motion and neck supple.   Skin:     General: Skin is warm and dry.      Capillary Refill: Capillary refill takes less than  2 seconds.   Neurological:      Mental Status: She is alert and oriented to person, place, and time.   Psychiatric:         Mood and Affect: Mood normal.         Behavior: Behavior normal.            Patient Data     Labs Ordered/Reviewed   ARTERIAL BLOOD GAS/LACTATE - Abnormal; Notable for the following components:       Result Value    PO2 (ARTERIAL) 74 (*)     All other components within normal limits    Narrative:     .  .with nursing and xray  .  Marland Kitchen   COMPREHENSIVE METABOLIC PANEL, NON-FASTING - Abnormal; Notable for the following components:    SODIUM 134 (*)     GLUCOSE 265 (*)     PROTEIN TOTAL 6.0 (*)     All other components within normal limits    Narrative:     Estimated Glomerular Filtration Rate (eGFR) is calculated using the CKD-EPI (2021) equation, intended for patients 65 years of age and older. If gender is not documented or "unknown", there will be no eGFR calculation.     CBC WITH DIFF - Abnormal; Notable for the following components:    HGB 10.2 (*)     All other  components within normal limits   LACTIC ACID LEVEL W/ REFLEX FOR LEVEL >2.0 - Normal   COVID-19, FLU A/B, RSV RAPID BY PCR - Normal    Narrative:     Results are for the simultaneous qualitative identification of SARS-CoV-2 (formerly 2019-nCoV), Influenza A, Influenza B, and RSV RNA. These etiologic agents are generally detectable in nasopharyngeal and nasal swabs during the ACUTE PHASE of infection. Hence, this test is intended to be performed on respiratory specimens collected from individuals with signs and symptoms of upper respiratory tract infection who meet Centers for Disease Control and Prevention (CDC) clinical and/or epidemiological criteria for Coronavirus Disease 2019 (COVID-19) testing. CDC COVID-19 criteria for testing on human specimens is available at [facility] webpage information for Healthcare Professionals: Coronavirus Disease 2019 (COVID-19) ([email or web address]).     False-negative results may occur if the virus has genomic mutations, insertions, deletions, or rearrangements or if performed very early in the course of illness. Otherwise, negative results indicate virus specific RNA targets are not detected, however negative results do not preclude SARS-CoV-2 infection/COVID-19, Influenza, or Respiratory syncytial virus infection. Results should not be used as the sole basis for patient management decisions. Negative results must be combined with clinical observations, patient history, and epidemiological information. If upper respiratory tract infection is still suspected based on exposure history together with other clinical findings, re-testing should be considered.    Test methodology:   Cepheid Xpert Xpress SARS-CoV-2/Flu/RSV Assay real-time polymerase chain reaction (RT-PCR) test on the GeneXpert Dx and Xpert Xpress systems.   TROPONIN-I - Normal   CBC/DIFF    Narrative:     The following orders were created for panel order CBC/DIFF.  Procedure                                Abnormality         Status                     ---------                               -----------         ------  CBC WITH ZOXW[[id number]]                Abnormal            Final result                 Please view results for these tests on the individual orders.       XR CHEST PA AND LATERAL   Final Result by Edi, Radresults In (01/23 0701)   Bilateral interstitial infiltrates are identified which may reflect atypical pneumonia. Clinical correlation is recommended.         Radiologist location ID: [id number]             Medical Decision Making          Medical Decision Making  Amount and/or Complexity of Data Reviewed  Labs: ordered.  Radiology: ordered. Decision-making details documented in ED Course.  ECG/medicine tests: ordered.    Risk  Prescription drug management.          Studies Assessed and/or Ordered:  Labs COVID flu RSV ABG 12 lead EKG chest x-ray    EKG:   This EKG interpreted by me shows:    Rate:  85    Interpretation:  Normal sinus rhythm rate of 85 beats per minute, no ST segment elevation or depression noted.  QTC interval 454.       MDM Narrative:    Pneumonia.  Dyspnea.  Exacerbation of asthma.  Patient presents to ED via EMS who report shortness of breath.  Patient states that she has shortness of breath that woke her up from asleep started this morning.  She reports having taken a DuoNeb prior to EMS arrival.  She also endorses history of chronic obstructive pulmonary disease and asthma.  Patient has increased work breathing on arrival.  Wheezing heard throughout lung fields.  Patient denies fever chills nausea vomiting chest pain.  Patient's blood pressure shortly after arrival rose to 204/110.  Patient received hydralazine 10 mg, blood pressure responded to 144/81.  CBC shows hemoglobin 10.2 hematocrit 31.7.  CMP shows sodium 134 glucose 265.  Lactic acid 0.8.  Troponin 9.  COVID flu RSV not detected.  ABG shows pH 7.41 pCO2 38 bicarb 24.2 PO2 74.   Twelve lead EKG shows no evidence of cardiac ischemia or infarction.  Chest x-ray shows bilateral interstitial infiltrates which may reflect atypical pneumonia.  Patient received DuoNeb treatment x2  Solu-Medrol 125 azithromycin 500 mg benzonatate 100 mg.  Patient continued to complain with blood pressure and pain secondary to her diagnosis of fibromyalgia and hypertension.  She requested home dose of lisinopril and gabapentin.  Patient received moderate relief after treatment, however she continues to report shortness of breath in his continued to have difficulties with her breathing.  Discussion held with the patient concerning exam findings she is agreeable to current treatment plan.  Consultation with hospitalist who is agreeable with the patient being admitted for further evaluation and treatment.          ED Course as of 06/22/23 1034   Thu Jun 22, 2023   0703 XR CHEST PA AND LATERAL  FINDINGS:  2 views of the chest were obtained. Platelike atelectasis is identified in the right perihilar region. Bilateral patchy interstitial infiltrates involving predominantly in the mid the lower lungs are identified which appear new since prior study and may reflect interstitial pneumonia. Atypical pneumonia should be excluded clinically. The heart size is within limits of normal. No pleural effusion or  pneumothorax is seen.            IMPRESSION:  Bilateral interstitial infiltrates are identified which may reflect atypical pneumonia. Clinical correlation is recommended.           Medications Administered in the ED   ipratropium-albuterol 0.5 mg-3 mg(2.5 mg base)/3 mL Solution for Nebulization (3 mL Nebulization Given 06/22/23 0710)   methylPREDNISolone sod succ (SOLU-medrol) 125 mg/2 mL injection (125 mg Intravenous Given 06/22/23 0726)   azithromycin (ZITHROMAX) tablet (500 mg Oral Given 06/22/23 0726)   ipratropium-albuterol 0.5 mg-3 mg(2.5 mg base)/3 mL Solution for Nebulization (3 mL Nebulization Given 06/22/23 0854)    hydrALAZINE (APRESOLINE) injection 10 mg (10 mg Intravenous Given 06/22/23 0846)   benzonatate (TESSALON) capsule (100 mg Oral Given 06/22/23 0846)       Patient will be admitted to the  service for further workup and management.    Disposition: Admitted             Clinical Impression   Pneumonia (Primary)   Dyspnea, unspecified type   Exacerbation of asthma, unspecified asthma severity, unspecified whether persistent         Current Discharge Medication List            /M. Xanthe Couillard, FNP-BC  Department of Emergency Medicine  [facility]

## 2023-06-22 NOTE — ED Nurses Note (Signed)
Patient resting on stretcher in position of most comfort talking on the phone and visitor. Denies needs. Patient was tearful earlier worried about her insurance.

## 2023-06-22 NOTE — H&P (Signed)
[facility]  Admission H&P    Date of Service:  06/22/2023  Jennifer [name], 48 y.o. female  Encounter Start Date:  06/22/2023  Inpatient Admission Date:   Date of Birth:  12/30/1975  PCP: Lilia Pro, FNP      Information Obtained from: patient  Chief Complaint:  shortness of breath    HPI: Jennifer [name] is a 48 y.o., Black/African American female who presents with complaint of shortness of breath, unable to catch her breath. States she kept her grandchildren yesterday who had snotty noses, but otherwise denies any known sick contacts. Last night when going to bed she started wheezing so she took a breathing treatment and went to sleep. However, this morning when she went to bathroom, she felt she couldn't breath or catch her breath. She took another neb treatment without relief. EMS arrived to bring her to ER. Associated chills, but no known fever.  Admits to history of COPD, personal smoking for 5 years, and additional 5 year exposure to second hand smoke.     PAST MEDICAL:    Past Medical History:   Diagnosis Date    Asthma     COPD (chronic obstructive pulmonary disease) (CMS HCC)     Diabetes mellitus, type 2 (CMS HCC)     HTN (hypertension)     Thyroid disease     Past Surgical History:   Procedure Laterality Date    HX CHOLECYSTECTOMY      HX TUBAL LIGATION              Medications Prior to Admission       Prescriptions    albuterol sulfate (PROVENTIL OR VENTOLIN OR PROAIR) 90 mcg/actuation Inhalation oral inhaler    Take 1-2 Puffs by inhalation Every 6 hours as needed    ARMOUR THYROID 240 mg Oral Tablet    Take 1 Tablet (240 mg total) by mouth Once a day    atorvastatin (LIPITOR) 20 mg Oral Tablet    Take 1 Tablet (20 mg total) by mouth Every evening    DULoxetine (CYMBALTA DR) 30 mg Oral Capsule, Delayed Release(E.C.)    Take 1 Capsule (30 mg total) by mouth Once a day    ergocalciferol, vitamin D2, (DRISDOL) 1,250 mcg (50,000 unit) Oral Capsule    Take 1 Capsule (50,000 Units total) by  mouth Every 7 days    fluticasone propion-salmeteroL (ADVAIR) 250-50 mcg/dose Inhalation oral diskus inhaler    Take 1 Puff (1 Inhalation total) by inhalation Twice daily    gabapentin (NEURONTIN) 800 mg Oral Tablet    Take 1 Tablet (800 mg total) by mouth Three times a day    insulin glargine 100 unit/mL Subcutaneous injection (vial)    Inject 15 Units under the skin Every evening    ipratropium-albuterol 0.5 mg-3 mg(2.5 mg base)/3 mL Solution for Nebulization    USE 1 VIAL IN NEBULIZER 4 TIMES DAILY AS NEEDED    lisinopriL-hydrochlorothiazide (ZESTORETIC) 20-12.5 mg Oral Tablet    Take 1 Tablet by mouth Once a day    montelukast (SINGULAIR) 10 mg Oral Tablet    Take 1 Tablet (10 mg total) by mouth Every evening    naproxen (NAPROSYN) 500 mg Oral Tablet    Take 1 Tablet (500 mg total) by mouth Twice daily with food    omeprazole (PRILOSEC) 40 mg Oral Capsule, Delayed Release(E.C.)    Take 1 Capsule (40 mg total) by mouth Once a day    OZEMPIC  1 mg/dose (4 mg/3 mL) Subcutaneous Pen Injector    Inject 1 mg under the skin Every 7 days    pioglitazone (ACTOS) 30 mg Oral Tablet    Take 1 Tablet (30 mg total) by mouth Once a day    triamcinolone acetonide 0.1 % Cream    Apply topically Twice daily     Allergies   Allergen Reactions    Aspirin     Tylenol [Acetaminophen]      Family History: heart disease and diabetes  Family Medical History:    None        Social History:  Social History     Tobacco Use    Smoking status: Former     Current packs/day: 0.00     Types: Cigarettes     Quit date: 2020     Years since quitting: 5.0    Smokeless tobacco: Former   Haematologist status: Never Used   Substance Use Topics    Alcohol use: Not Currently     Comment: couple glasses of wine on the weekend    Drug use: Not Currently     Comment: has not used marijuana in past several months      Review of Systems:  Review of Systems   Constitutional:  Positive for chills and diaphoresis. Negative for fever.   Respiratory:   Positive for shortness of breath and wheezing.    Cardiovascular:  Negative for chest pain and leg swelling.   Gastrointestinal:  Negative for abdominal pain, constipation, diarrhea, nausea and vomiting.   Genitourinary:  Negative for dysuria.   Neurological:  Positive for weakness.      Examination:  Temperature: 36.8 C (98.2 F) Heart Rate: (!) 103 BP (Non-Invasive): (!) 178/81   Respiratory Rate: (!) 21 SpO2: 99 %     Physical Exam  Constitutional:       General: She is not in acute distress.  Cardiovascular:      Rate and Rhythm: Normal rate and regular rhythm.   Pulmonary:      Effort: Pulmonary effort is normal. No respiratory distress.      Breath sounds: Wheezing present. No rhonchi.   Abdominal:      General: Bowel sounds are normal. There is no distension.      Palpations: Abdomen is soft.      Tenderness: There is no abdominal tenderness.   Musculoskeletal:         General: No swelling.   Skin:     General: Skin is warm and dry.   Neurological:      General: No focal deficit present.      Mental Status: She is alert and oriented to person, place, and time.     Labs:    I have reviewed all lab results.  Lab Results Today:    Results for orders placed or performed during the hospital encounter of 06/22/23 (from the past 24 hour(s))   COMPREHENSIVE METABOLIC PANEL, NON-FASTING   Result Value Ref Range    SODIUM 134 (L) 136 - 145 mmol/L    POTASSIUM 3.9 3.5 - 5.1 mmol/L    CHLORIDE 105 98 - 107 mmol/L    CO2 TOTAL 25 21 - 31 mmol/L    ANION GAP 4 4 - 13 mmol/L    BUN 13 7 - 25 mg/dL    CREATININE 1.61 0.96 - 1.30 mg/dL    BUN/CREA RATIO 19 6 - 22    ESTIMATED GFR  107 >59 mL/min/1.34m^2    ALBUMIN 3.5 3.5 - 5.7 g/dL    CALCIUM 8.6 8.6 - 09.8 mg/dL    GLUCOSE 119 (H) 74 - 109 mg/dL    ALKALINE PHOSPHATASE 78 34 - 104 U/L    ALT (SGPT) 26 7 - 52 U/L    AST (SGOT) 24 13 - 39 U/L    BILIRUBIN TOTAL 0.3 0.3 - 1.0 mg/dL    PROTEIN TOTAL 6.0 (L) 6.4 - 8.9 g/dL    ALBUMIN/GLOBULIN RATIO 1.4 0.8 - 1.4    OSMOLALITY,  CALCULATED 278 270 - 290 mOsm/kg    CALCIUM, CORRECTED 9.0 8.9 - 10.8 mg/dL    GLOBULIN 2.5 2.0 - 3.5   LACTIC ACID LEVEL W/ REFLEX FOR LEVEL >2.0   Result Value Ref Range    LACTIC ACID 0.8 0.5 - 2.2 mmol/L   TROPONIN-I   Result Value Ref Range    TROPONIN I 9 <15 ng/L   CBC WITH DIFF   Result Value Ref Range    WBC 7.4 3.8 - 11.8 x10^3/uL    RBC 3.97 3.63 - 4.92 x10^6/uL    HGB 10.2 (L) 10.9 - 14.3 g/dL    HCT 14.7 82.9 - 56.2 %    MCV 79.9 75.5 - 95.3 fL    MCH 25.8 24.7 - 32.8 pg    MCHC 32.3 32.3 - 35.6 g/dL    RDW 13.0 86.5 - 78.4 %    PLATELETS 264 140 - 440 x10^3/uL    MPV 8.9 7.9 - 10.8 fL    NEUTROPHIL % 65 43 - 77 %    LYMPHOCYTE % 26 16 - 44 %    MONOCYTE % 7 5 - 13 %    EOSINOPHIL % 2 1 - 7 %    BASOPHIL % 1 0 - 1 %    NEUTROPHIL # 4.80 1.85 - 7.80 x10^3/uL    LYMPHOCYTE # 1.90 1.00 - 3.00 x10^3/uL    MONOCYTE # 0.50 0.30 - 1.00 x10^3/uL    EOSINOPHIL # 0.10 0.00 - 0.50 x10^3/uL    BASOPHIL # 0.10 0.00 - 0.10 x10^3/uL   FERRITIN   Result Value Ref Range    FERRITIN 5 (L) 11 - 307 ng/mL   IRON TRANSFERRIN AND TIBC   Result Value Ref Range    TOTAL IRON BINDING CAPACITY 438 250 - 450 ug/dL    IRON (TRANSFERRIN) SATURATION 5 (L) 15 - 50 %    IRON 20 (L) 50 - 212 ug/dL    TRANSFERRIN 696 295 - 362 mg/dL    UIBC 284 (H) 132 - 440 ug/dL   NUU7O (HEMOGLOBIN Z3G WITH EST AVG GLUCOSE)   Result Value Ref Range    HEMOGLOBIN A1C 7.8 (H) 4.0 - 6.0 %   ECG 12 LEAD   Result Value Ref Range    Ventricular rate 85 BPM    Atrial Rate 85 BPM    PR Interval 158 ms    QRS Duration 76 ms    QT Interval 382 ms    QTC Calculation 454 ms    Calculated P Axis 57 degrees    Calculated R Axis 22 degrees    Calculated T Axis 34 degrees   ARTERIAL BLOOD GAS/LACTATE   Result Value Ref Range    PH (ARTERIAL) 7.41 7.35 - 7.45    PCO2 (ARTERIAL) 38 35 - 45 mm/Hg    BICARBONATE (ARTERIAL) 24.2 20.0 - 26.0 mmol/L    MET-HEMOGLOBIN 0.5 <=2.0 %  LACTATE 0.9 <=2.0 mmol/L    CARBOXYHEMOGLOBIN 1.1 <=1.5 %    O2CT 12.7 %    %FIO2 (ARTERIAL)  21 %    PO2 (ARTERIAL) 74 (L) 80 - 100 mm/Hg    OXYHEMOGLOBIN 92.9 88.0 - 100.0 %    ALLEN TEST yes     DRAW SITE right brachial     BASE DEFICIT 0.3 0.0 - 2.0 mmol/L   COVID-19, FLU A/B, RSV RAPID BY PCR   Result Value Ref Range    SARS-CoV-2 Not Detected Not Detected    INFLUENZA VIRUS TYPE A Not Detected Not Detected    INFLUENZA VIRUS TYPE B Not Detected Not Detected    RESPIRATORY SYNCTIAL VIRUS (RSV) Not Detected Not Detected   POC BLOOD GLUCOSE (RESULTS)   Result Value Ref Range    GLUCOSE, POC 343 (H) 70 - 100 mg/dl   BLUE TOP TUBE   Result Value Ref Range    RAINBOW/EXTRA TUBE AUTO RESULT Yes    GRAY TOP TUBE   Result Value Ref Range    RAINBOW/EXTRA TUBE AUTO RESULT Yes      Imaging Studies:    No results found.  XR CHEST PA AND LATERAL   Final Result   Bilateral interstitial infiltrates are identified which may reflect atypical pneumonia. Clinical correlation is recommended.         Radiologist location ID: [id number]           DNR Status:  FULL CODE: ATTEMPT RESUSCITATION/CPR    Assessment/Plan:   Active Hospital Problems    Diagnosis    Primary Problem: Pneumonia    COPD exacerbation (CMS HCC)    HTN (hypertension)    Diabetes mellitus, type 2 (CMS HCC)    Thyroid disease     Atypical pneumonia, suspect pneumococcal vs legionella:  COPD exacerbation:  Cover with rocpehin and azithromycin.   Dolu-medrol and duonebs also ordered.   Respiratory panel negative.   Strep pneumo and legionella antigen testing ordered.   HTN:  Resume home lisinopril/hydrochlorothiazide.   Monitor.   Diabetes type 2:  Accu-checks with issc.   Ha1c 7.8.  Thyroid disease:  Resume home armour thyroid.   Other:  further treatment adjustments will be made based off clinical course.  See orders for further details.  Hospitalist personally evaluated and examined the patient in conjunction with the MLP and agree with the assessments, treatment plan and disposition of the patient as recorded by the [facility].    DVT/PE Prophylaxis:  Heparin    Harshaan Whang A Maiana Hennigan FNP-BC

## 2023-06-22 NOTE — ED Nurses Note (Signed)
Report called to [place], [place]

## 2023-06-23 LAB — CBC WITH DIFF
BASOPHIL #: 0 10*3/uL (ref 0.00–0.10)
BASOPHIL %: 0 % (ref 0–1)
EOSINOPHIL #: 0 10*3/uL (ref 0.00–0.50)
EOSINOPHIL %: 0 % — ABNORMAL LOW (ref 1–7)
HCT: 31.2 % (ref 31.2–41.9)
HGB: 10 g/dL — ABNORMAL LOW (ref 10.9–14.3)
LYMPHOCYTE #: 1.4 10*3/uL (ref 1.00–3.00)
LYMPHOCYTE %: 9 % — ABNORMAL LOW (ref 16–44)
MCH: 25.6 pg (ref 24.7–32.8)
MCHC: 32 g/dL — ABNORMAL LOW (ref 32.3–35.6)
MCV: 80 fL (ref 75.5–95.3)
MONOCYTE #: 1.1 10*3/uL — ABNORMAL HIGH (ref 0.30–1.00)
MONOCYTE %: 6 % (ref 5–13)
MPV: 9.7 fL (ref 7.9–10.8)
NEUTROPHIL #: 14.1 10*3/uL — ABNORMAL HIGH (ref 1.85–7.80)
NEUTROPHIL %: 85 % — ABNORMAL HIGH (ref 43–77)
PLATELETS: 249 10*3/uL (ref 140–440)
RBC: 3.9 10*6/uL (ref 3.63–4.92)
RDW: 16.8 % (ref 12.3–17.7)
WBC: 16.6 10*3/uL — ABNORMAL HIGH (ref 3.8–11.8)

## 2023-06-23 LAB — COMPREHENSIVE METABOLIC PANEL, NON-FASTING
ALBUMIN/GLOBULIN RATIO: 1.3 (ref 0.8–1.4)
ALBUMIN: 3.8 g/dL (ref 3.5–5.7)
ALKALINE PHOSPHATASE: 78 U/L (ref 34–104)
ALT (SGPT): 26 U/L (ref 7–52)
ANION GAP: 8 mmol/L (ref 4–13)
AST (SGOT): 21 U/L (ref 13–39)
BILIRUBIN TOTAL: 0.3 mg/dL (ref 0.3–1.0)
BUN/CREA RATIO: 20 (ref 6–22)
BUN: 20 mg/dL (ref 7–25)
CALCIUM, CORRECTED: 9.6 mg/dL (ref 8.9–10.8)
CALCIUM: 9.4 mg/dL (ref 8.6–10.3)
CHLORIDE: 102 mmol/L (ref 98–107)
CO2 TOTAL: 22 mmol/L (ref 21–31)
CREATININE: 1.01 mg/dL (ref 0.60–1.30)
ESTIMATED GFR: 69 mL/min/{1.73_m2} (ref >59)
GLOBULIN: 3 (ref 2.0–3.5)
GLUCOSE: 342 mg/dL — ABNORMAL HIGH (ref 74–109)
OSMOLALITY, CALCULATED: 281 mosm/kg (ref 270–290)
POTASSIUM: 3.9 mmol/L (ref 3.5–5.1)
PROTEIN TOTAL: 6.8 g/dL (ref 6.4–8.9)
SODIUM: 132 mmol/L — ABNORMAL LOW (ref 136–145)

## 2023-06-23 LAB — MAGNESIUM: MAGNESIUM: 2 mg/dL (ref 1.9–2.7)

## 2023-06-23 LAB — POC BLOOD GLUCOSE (RESULTS)
GLUCOSE, POC: 319 mg/dL — ABNORMAL HIGH (ref 70–100)
GLUCOSE, POC: 282 mg/dL — ABNORMAL HIGH (ref 70–100)

## 2023-06-23 LAB — PROCALCITONIN: PROCALCITONIN: <0.02 ng/mL (ref <0.50)

## 2023-06-23 MED ORDER — GUAIFENESIN 100 MG/5 ML ORAL LIQUID
200.0000 mg | ORAL | Status: DC | PRN
Start: 2023-06-23 — End: 2023-06-23
  Administered 2023-06-23 (×2): 200 mg via ORAL
  Filled 2023-06-23 (×2): qty 10

## 2023-06-23 MED ORDER — BENZONATATE 100 MG CAPSULE
100.0000 mg | ORAL_CAPSULE | Freq: Three times a day (TID) | ORAL | 0 refills | Status: AC | PRN
Start: 2023-06-23 — End: 2023-06-26

## 2023-06-23 MED ORDER — AZITHROMYCIN 250 MG TABLET
250.0000 mg | ORAL_TABLET | Freq: Every day | ORAL | 0 refills | Status: AC
Start: 2023-06-23 — End: 2023-06-27

## 2023-06-23 MED ORDER — CEFDINIR 300 MG CAPSULE
300.0000 mg | ORAL_CAPSULE | Freq: Two times a day (BID) | ORAL | 0 refills | Status: AC
Start: 2023-06-23 — End: 2023-06-29

## 2023-06-23 MED ORDER — PREDNISONE 20 MG TABLET
40.0000 mg | ORAL_TABLET | Freq: Every day | ORAL | 0 refills | Status: AC
Start: 2023-06-23 — End: 2023-06-28

## 2023-06-23 NOTE — Care Plan (Signed)
Problem: Adult Inpatient Plan of Care  Goal: Plan of Care Review  Outcome: Ongoing (see interventions/notes)  Goal: Patient-Specific Goal (Individualized)  Outcome: Ongoing (see interventions/notes)  Goal: Absence of Hospital-Acquired Illness or Injury  Outcome: Ongoing (see interventions/notes)  Goal: Optimal Comfort and Wellbeing  Outcome: Ongoing (see interventions/notes)  Goal: Rounds/Family Conference  Outcome: Ongoing (see interventions/notes)     Problem: Fall Injury Risk  Goal: Absence of Fall and Fall-Related Injury  Outcome: Ongoing (see interventions/notes)     Problem: Breathing Pattern Ineffective  Goal: Effective Breathing Pattern  Outcome: Ongoing (see interventions/notes)     Problem: Asthma Comorbidity  Goal: Maintenance of Asthma Control  Outcome: Ongoing (see interventions/notes)     Problem: COPD (Chronic Obstructive Pulmonary Disease) Comorbidity  Goal: Maintenance of COPD Symptom Control  Outcome: Ongoing (see interventions/notes)     Problem: Diabetes Comorbidity  Goal: Blood Glucose Level Within Targeted Range  Outcome: Ongoing (see interventions/notes)     Problem: Hypertension Comorbidity  Goal: Blood Pressure in Desired Range  Outcome: Ongoing (see interventions/notes)     Problem: Pain Chronic (Persistent) (Comorbidity Management)  Goal: Acceptable Pain Control and Functional Ability  Outcome: Ongoing (see interventions/notes)

## 2023-06-23 NOTE — Nurses Notes (Addendum)
Pt ambulating in the hallway, room air only, O2 sat 95% at the lowest. Pt did say she becomes short of breath with exercise .  Pt concerned about follow up, all medications as well as Dr. Algie Coffer made and reviewed with patient. Pt confirmed her brother Kirstie Mirza would pick up her medication from [place]. Pt given emotional support as well as retrieval of items from the third floor.

## 2023-06-23 NOTE — Discharge Summary (Signed)
[place] MEDICINE [facility]     DISCHARGE SUMMARY      PATIENT NAME:  Jennifer [name]   MRN:  [id number]  DOB:  Oct 17, 1975    INPATIENT ADMISSION DATE: 06/22/2023   DATE OF DISCHARGE:  06/23/23     ATTENDING PHYSICIAN:  Lalla Brothers, MD    HOSPITAL PRESENTATION:  Please see full admission H&P for details.    As per HPI:  Jennifer [name] is a 48 y.o., Black/African American female who presents with complaint of shortness of breath, unable to catch her breath. States she kept her grandchildren yesterday who had snotty noses, but otherwise denies any known sick contacts. Last night when going to bed she started wheezing so she took a breathing treatment and went to sleep. However, this morning when she went to bathroom, she felt she couldn't breath or catch her breath. She took another neb treatment without relief. EMS arrived to bring her to ER. Associated chills, but no known fever.  Admits to history of COPD, personal smoking for 5 years, and additional 5 year exposure to second hand smoke.     FURTHER HOSPITAL COURSE:  Patient was admitted with atypical pneumonia suspect pneuococcal vs legionella, COPD exacerbation, HTN type 2 diabetes mellitus, thyroid disease and chronic conditions. Patient was placed on solu-medrol, rocephin, azithromycin, and duonebs. Respiratory panel was negative. Strep pneumo and legionella antigen testing ordered. HA1C 7.8. patient also placed on accu-checks with issc. Patient's overall condition has rapidly improved. Patient seen and examined by hospitalist, considered stable for discharge home on oral medications to complete treatment course. Follow up with PCP in 1 week. Follow up with Dr. Terrilee Croak in 1 week for pulmonology.     PROBLEM LIST:  Active Hospital Problems    Diagnosis Date Noted    Principal Problem: Pneumonia [J18.9] 06/22/2023    COPD exacerbation (CMS HCC) [J44.1] 06/22/2023    HTN (hypertension) [I10]     Diabetes mellitus, type 2 (CMS HCC) [E11.9]      Thyroid disease [E07.9]       Resolved Hospital Problems   No resolved problems to display.     There are no active non-hospital problems to display for this patient.    Nutrition:    DIET CARDIAC (2G NA, LOWFAT, LOW CHOL) Do you want to initiate MNT Protocol? Yes; Calorie amount: CC 1800    Additional clinical characteristics related to nutrition:    - monitor for weight changes   - monitor intake and output    - monitor bowel functions        PHYSICAL EXAM DAY OF DISCHARGE:  BP (!) 148/76   Pulse (!) 105   Temp 36.8 C (98.2 F)   Resp 20   Ht 1.702 m (5\' 7" )   Wt 104 kg (230 lb)   SpO2 95%   BMI 36.02 kg/m     Patient examined by hospitalist.     LABS WITHIN LAST 24 HOURS:   Results for orders placed or performed during the hospital encounter of 06/22/23 (from the past 24 hour(s))   PROCALCITONIN   Result Value Ref Range    PROCALCITONIN <0.02 <0.50 ng/mL   POC BLOOD GLUCOSE (RESULTS)   Result Value Ref Range    GLUCOSE, POC 343 (H) 70 - 100 mg/dl   BLUE TOP TUBE   Result Value Ref Range    RAINBOW/EXTRA TUBE AUTO RESULT Yes    GOLD TOP TUBE   Result Value Ref Range  RAINBOW/EXTRA TUBE AUTO RESULT Yes    LAVENDER TOP TUBE   Result Value Ref Range    RAINBOW/EXTRA TUBE AUTO RESULT Yes    GRAY TOP TUBE   Result Value Ref Range    RAINBOW/EXTRA TUBE AUTO RESULT Yes    MRSA SCREEN    Specimen: Swab   Result Value Ref Range    MRSA No Growth    POC BLOOD GLUCOSE (RESULTS)   Result Value Ref Range    GLUCOSE, POC 332 (H) 70 - 100 mg/dl   POC BLOOD GLUCOSE (RESULTS)   Result Value Ref Range    GLUCOSE, POC 326 (H) 70 - 100 mg/dl   COMPREHENSIVE METABOLIC PANEL, NON-FASTING   Result Value Ref Range    SODIUM 132 (L) 136 - 145 mmol/L    POTASSIUM 3.9 3.5 - 5.1 mmol/L    CHLORIDE 102 98 - 107 mmol/L    CO2 TOTAL 22 21 - 31 mmol/L    ANION GAP 8 4 - 13 mmol/L    BUN 20 7 - 25 mg/dL    CREATININE 1.61 0.96 - 1.30 mg/dL    BUN/CREA RATIO 20 6 - 22    ESTIMATED GFR 69 >59 mL/min/1.23m^2    ALBUMIN 3.8 3.5 - 5.7 g/dL     CALCIUM 9.4 8.6 - 04.5 mg/dL    GLUCOSE 409 (H) 74 - 109 mg/dL    ALKALINE PHOSPHATASE 78 34 - 104 U/L    ALT (SGPT) 26 7 - 52 U/L    AST (SGOT) 21 13 - 39 U/L    BILIRUBIN TOTAL 0.3 0.3 - 1.0 mg/dL    PROTEIN TOTAL 6.8 6.4 - 8.9 g/dL    ALBUMIN/GLOBULIN RATIO 1.3 0.8 - 1.4    OSMOLALITY, CALCULATED 281 270 - 290 mOsm/kg    CALCIUM, CORRECTED 9.6 8.9 - 10.8 mg/dL    GLOBULIN 3.0 2.0 - 3.5   MAGNESIUM   Result Value Ref Range    MAGNESIUM 2.0 1.9 - 2.7 mg/dL   CBC WITH DIFF   Result Value Ref Range    WBC 16.6 (H) 3.8 - 11.8 x10^3/uL    RBC 3.90 3.63 - 4.92 x10^6/uL    HGB 10.0 (L) 10.9 - 14.3 g/dL    HCT 81.1 91.4 - 78.2 %    MCV 80.0 75.5 - 95.3 fL    MCH 25.6 24.7 - 32.8 pg    MCHC 32.0 (L) 32.3 - 35.6 g/dL    RDW 95.6 21.3 - 08.6 %    PLATELETS 249 140 - 440 x10^3/uL    MPV 9.7 7.9 - 10.8 fL    NEUTROPHIL % 85 (H) 43 - 77 %    LYMPHOCYTE % 9 (L) 16 - 44 %    MONOCYTE % 6 5 - 13 %    EOSINOPHIL % 0 (L) 1 - 7 %    BASOPHIL % 0 0 - 1 %    NEUTROPHIL # 14.10 (H) 1.85 - 7.80 x10^3/uL    LYMPHOCYTE # 1.40 1.00 - 3.00 x10^3/uL    MONOCYTE # 1.10 (H) 0.30 - 1.00 x10^3/uL    EOSINOPHIL # 0.00 0.00 - 0.50 x10^3/uL    BASOPHIL # 0.00 0.00 - 0.10 x10^3/uL   POC BLOOD GLUCOSE (RESULTS)   Result Value Ref Range    GLUCOSE, POC 319 (H) 70 - 100 mg/dl        IMAGING WITHIN LAST 24 HOURS:   XR CHEST PA AND LATERAL    Result Date: 06/22/2023  Impression Bilateral interstitial infiltrates are identified which may reflect atypical pneumonia. Clinical correlation is recommended. Radiologist location ID: [id number]    XR CHEST PA AND LATERAL   Final Result   Bilateral interstitial infiltrates are identified which may reflect atypical pneumonia. Clinical correlation is recommended.         Radiologist location ID: [id number]              MICROBIOLOGY WITHIN LAST 24 HOURS:   Hospital Encounter on 06/22/23 (from the past 24 hour(s))   MRSA SCREEN    Collection Time: 06/22/23 12:49 PM    Specimen: Swab   Culture Result Status     MRSA No Growth Preliminary        DISCHARGE MEDICATIONS:     Current Discharge Medication List        START taking these medications.        Details   azithromycin 250 mg Tablet  Commonly known as: ZITHROMAX   250 mg, Oral, DAILY  Qty: 4 Tablet  Refills: 0     benzonatate 100 mg Capsule  Commonly known as: TESSALON   100 mg, Oral, EVERY 8 HOURS PRN  Qty: 9 Capsule  Refills: 0     cefdinir 300 mg Capsule  Commonly known as: OMNICEF   300 mg, Oral, 2 TIMES DAILY  Qty: 12 Capsule  Refills: 0     predniSONE 20 mg Tablet  Commonly known as: DELTASONE   40 mg, Oral, DAILY  Qty: 10 Tablet  Refills: 0            CONTINUE these medications - NO CHANGES were made during your visit.        Details   albuterol sulfate 90 mcg/actuation oral inhaler  Commonly known as: PROVENTIL or VENTOLIN or PROAIR   1-2 Puffs, Inhalation, EVERY 6 HOURS PRN  Refills: 0     Armour Thyroid 240 mg Tablet  Generic drug: thyroid   240 mg, Oral, DAILY  Refills: 0     atorvastatin 20 mg Tablet  Commonly known as: LIPITOR   20 mg, Oral, EVERY EVENING  Refills: 0     DULoxetine 30 mg Capsule, Delayed Release(E.C.)  Commonly known as: CYMBALTA DR   30 mg, Oral, DAILY  Refills: 0     ergocalciferol (vitamin D2) 1,250 mcg (50,000 unit) Capsule  Commonly known as: DRISDOL   50,000 Units, Oral, EVERY 7 DAYS  Refills: 0     fluticasone propion-salmeteroL 250-50 mcg/dose oral diskus inhaler  Commonly known as: ADVAIR   1 Puff, Inhalation, 2 TIMES DAILY  Refills: 0     gabapentin 800 mg Tablet  Commonly known as: NEURONTIN   800 mg, Oral, 3 TIMES DAILY  Refills: 0     insulin glargine 100 unit/mL injection (vial)   15 Units, Subcutaneous, EVERY EVENING  Refills: 0     ipratropium-albuteroL 0.5 mg-3 mg(2.5 mg base)/3 mL nebulizer solution  Commonly known as: DUONEB   USE 1 VIAL IN NEBULIZER 4 TIMES DAILY AS NEEDED  Refills: 0     lisinopriL-hydrochlorothiazide 20-12.5 mg Tablet  Commonly known as: ZESTORETIC   1 Tablet, Oral, DAILY  Refills: 0     loratadine 10  mg Tablet  Commonly known as: CLARITIN   10 mg, Oral, DAILY  Refills: 0     magnesium oxide 400 mg Tablet  Commonly known as: MAG-OX   400 mg, Oral, DAILY  Refills: 0     montelukast 10 mg Tablet  Commonly  known as: SINGULAIR   10 mg, Oral, EVERY EVENING  Refills: 0     naproxen 500 mg Tablet  Commonly known as: NAPROSYN   500 mg, Oral, 2 TIMES DAILY WITH FOOD  Refills: 0     omeprazole 40 mg Capsule, Delayed Release(E.C.)  Commonly known as: PRILOSEC   40 mg, Oral, DAILY  Refills: 0     Ozempic 1 mg/dose (4 mg/3 mL) Pen Injector  Generic drug: semaglutide   1 mg, Subcutaneous, EVERY 7 DAYS  Refills: 0     pioglitazone 30 mg Tablet  Commonly known as: ACTOS   30 mg, Oral, DAILY  Refills: 0     triamcinolone acetonide 0.1 % Cream   Topical, 2 TIMES DAILY  Refills: 0             DISCHARGE DISPOSITION:  home    DISCHARGE INSTRUCTIONS:  No discharge procedures on file.   Follow-up Information       Deel, Guadlupe Spanish, FNP Follow up in 1 week(s).    Specialty: NURSE PRACTITIONER  Why: hospital follow up  Contact information:  [address]  [place] [address]  [phone]        Renaye Rakers, MD Follow up in 1 week(s).    Specialty: PULMONARY DISEASE  Why: hospital follow up  Contact information:  [address]  [place] [address]  [phone]           Copies sent to Care Team         Relationship Specialty Notifications Start End    Deel, Guadlupe Spanish, FNP PCP - General NURSE PRACTITIONER  09/09/21     Phone: [phone] Fax: [phone]         [address] [place] [address]            The hospitalist examined patient, reviewed material, and agreed with discharge at this time.   >30 minutes total were spent coordinating discharge day today    Maxine Glenn, FNP-BC  [facility] MEDICINE HOSPITALIST

## 2023-06-23 NOTE — Care Plan (Signed)
Medical Nutrition Therapy Assessment    Reason for assessment: nurse referral to discuss diet and obtain food prefs    SUBJECTIVE : Patient not happy with diet.  Eats what she wants at home.  Discussed A1c level and recommended diet modifications.  Called kitchen with lunch order.  Encouraged her to limit sugars and salt.  Patient happy with current glucose levels - had been running the 300-400's.    OBJECTIVE: A1c 7.8  BP elevated    PMH includes:  DM    Current Diet Order/Nutrition Support:  DIET CARDIAC (2G NA, LOWFAT, LOW CHOL) Do you want to initiate MNT Protocol? Yes; Calorie amount: CC 1800                           Physical Assessment: overweight         Comments: therapeutic diet encouraged       Plan/Interventions : continue heart healthy consistent carb meal plan    Goal: improved Glucose and vitals        Judyann Munson, RDLD

## 2023-06-23 NOTE — Care Management Notes (Signed)
Patient requested to speak with CM with concerns about medication coverage.  She advised that recently her Medicaid was stopped and she was unable to afford all of her medications.  She also advised that her food stamps had stopped.  Spoke with Apolinar Junes, [facility], and he advised that patient's Medicaid shows as valid.  Her food stamps were recently denied due to needing verification of income.  Contacted [employer] and confirmed that Medicaid shows active and her meds were covered with no copay.   Patient plans to go to [facility] the beginning of the week to provide needed paperwork for her food stamps. Patient advised that she has a CPAP and nebulizer for home use through [facility] but she is unsure if her electricity is on.  Contacted [employer] and they were unable to give specifics on patient's account other than the residence listed ([address]) shows as "connected".  Patient is aware of resources for food and utility assistance through [employer], [employer], [employer], and [employer]
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
## 2023-06-24 LAB — MRSA SCREEN: MRSA: NO GROWTH

## 2023-06-24 LAB — LEGIONELLA URINE ANTIGEN: LEGIONELLA ANTIGEN: NEGATIVE

## 2023-06-24 LAB — STREPTOCOCCUS PNEUMONIAE ANTIGEN,URINE: S.PNEUMONIA ANTIGEN: NEGATIVE

## 2023-06-24 NOTE — ED Notes (Signed)
[facility] - Emergency Department  Peer Recovery Coach Assessment    Initial Evaluation                         Substance Use History  Patient current substance use status: FALSE POSITIVE: NO DRUGS OR ALCOHOL INVOLVED. PEER SERVICES NOT REQUIRED.                                  Family, Social, Home & Safety History                                                 Employment            Engagement  Readiness ruler: 1  Summary of assessment priority areas: comments: FALSE POSITIVE: NO DRUGS OR ALCOHOL INVOLVED. PEER SERVICES NOT REQUIRED.    Brief Intervention  Discussed plan to reduce/quit substance use?: No  Discussed willingness to enter treatment?: No  Indicated patient's stage of change:: 1 - Precontemplation    Patient seen by Peer Recovery Coach and is a candidate for buprenorphine administration in the ED. Patient needs assessment for bup treatment.: No    Plan  Was the patient referred to treatment?: No    Was patient referred to physician for Buprenorphine Assessment in the ED?: No    Did patient receive Narcan in the ED?: No    Plan: Additional Comments: FALSE POSITIVE: NO DRUGS OR ALCOHOL INVOLVED. PEER SERVICES NOT REQUIRED.    Follow-up           Need for additional follow-up?: No  Additional comments: FALSE POSITIVE: NO DRUGS OR ALCOHOL INVOLVED. PEER SERVICES NOT REQUIRED.    Yuktha Kerchner, Peer Recovery Coach 06/24/2023 10:54

## 2023-06-26 ENCOUNTER — Ambulatory Visit: Attending: SLEEP MEDICINE | Admitting: SLEEP MEDICINE

## 2023-06-26 ENCOUNTER — Encounter (HOSPITAL_COMMUNITY): Payer: Self-pay | Admitting: SLEEP MEDICINE

## 2023-06-26 ENCOUNTER — Other Ambulatory Visit: Payer: Self-pay

## 2023-06-26 VITALS — BP 189/109 | HR 108 | Temp 98.2°F | Resp 22 | Ht 67.0 in | Wt 230.0 lb

## 2023-06-26 DIAGNOSIS — J189 Pneumonia, unspecified organism: Secondary | ICD-10-CM | POA: Insufficient documentation

## 2023-06-26 DIAGNOSIS — J441 Chronic obstructive pulmonary disease with (acute) exacerbation: Secondary | ICD-10-CM | POA: Insufficient documentation

## 2023-06-26 MED ORDER — SPIRIVA RESPIMAT 2.5 MCG/ACTUATION SOLUTION FOR INHALATION
2.0000 | Freq: Every day | RESPIRATORY_TRACT | 2 refills | Status: DC
Start: 2023-06-26 — End: 2023-10-17

## 2023-06-26 MED ORDER — BUDESONIDE-FORMOTEROL HFA 160 MCG-4.5 MCG/ACTUATION AEROSOL INHALER
2.0000 | INHALATION_SPRAY | Freq: Two times a day (BID) | RESPIRATORY_TRACT | 2 refills | Status: DC
Start: 2023-06-26 — End: 2023-10-17

## 2023-06-26 MED ORDER — ALBUTEROL SULFATE HFA 90 MCG/ACTUATION AEROSOL INHALER
1.0000 | INHALATION_SPRAY | Freq: Four times a day (QID) | RESPIRATORY_TRACT | 2 refills | Status: DC | PRN
Start: 2023-06-26 — End: 2023-10-17

## 2023-06-26 NOTE — Assessment & Plan Note (Signed)
Orders:    CXR PA & Lat; Future    CT Chest WO; Future
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
## 2023-06-26 NOTE — Nursing Note (Signed)
Release for medical records signed and faxed to [facility] in [place], [place] to obtain previous sleep study, MSLT and narcolepsy lab work. Fax confirmation received.

## 2023-06-27 ENCOUNTER — Ambulatory Visit
Admission: RE | Admit: 2023-06-27 | Discharge: 2023-06-27 | Disposition: A | Discharge status: Home / Self Care | Source: Ambulatory Visit | Attending: SLEEP MEDICINE | Admitting: SLEEP MEDICINE

## 2023-06-27 DIAGNOSIS — J441 Chronic obstructive pulmonary disease with (acute) exacerbation: Secondary | ICD-10-CM | POA: Insufficient documentation

## 2023-06-27 DIAGNOSIS — J189 Pneumonia, unspecified organism: Secondary | ICD-10-CM | POA: Insufficient documentation

## 2023-06-27 NOTE — Nursing Note (Signed)
Release for medical records sent to [facility] to obtain previous sleep study, MSLT and narcolepsy lab work.

## 2023-07-12 ENCOUNTER — Emergency Department
Admission: EM | Admit: 2023-07-12 | Discharge: 2023-07-12 | Disposition: A | Discharge status: Home / Self Care | Attending: Family Medicine | Admitting: Family Medicine

## 2023-07-12 ENCOUNTER — Ambulatory Visit (HOSPITAL_COMMUNITY)

## 2023-07-12 ENCOUNTER — Encounter (HOSPITAL_COMMUNITY): Payer: Self-pay | Admitting: Family Medicine

## 2023-07-12 ENCOUNTER — Other Ambulatory Visit: Payer: Self-pay

## 2023-07-12 ENCOUNTER — Emergency Department (HOSPITAL_COMMUNITY)

## 2023-07-12 DIAGNOSIS — W109XXA Fall (on) (from) unspecified stairs and steps, initial encounter: Secondary | ICD-10-CM | POA: Insufficient documentation

## 2023-07-12 DIAGNOSIS — Z041 Encounter for examination and observation following transport accident: Secondary | ICD-10-CM | POA: Insufficient documentation

## 2023-07-12 DIAGNOSIS — W010XXA Fall on same level from slipping, tripping and stumbling without subsequent striking against object, initial encounter: Secondary | ICD-10-CM

## 2023-07-12 DIAGNOSIS — I10 Essential (primary) hypertension: Secondary | ICD-10-CM | POA: Insufficient documentation

## 2023-07-12 DIAGNOSIS — W19XXXA Unspecified fall, initial encounter: Secondary | ICD-10-CM

## 2023-07-12 DIAGNOSIS — M25551 Pain in right hip: Secondary | ICD-10-CM | POA: Insufficient documentation

## 2023-07-12 DIAGNOSIS — Y9301 Activity, walking, marching and hiking: Secondary | ICD-10-CM | POA: Insufficient documentation

## 2023-07-12 DIAGNOSIS — M62830 Muscle spasm of back: Secondary | ICD-10-CM | POA: Insufficient documentation

## 2023-07-12 DIAGNOSIS — M545 Low back pain, unspecified: Secondary | ICD-10-CM | POA: Insufficient documentation

## 2023-07-12 LAB — URINALYSIS, MACROSCOPIC
BILIRUBIN: NEGATIVE mg/dL
BLOOD: 0.2 mg/dL — AB
GLUCOSE: NEGATIVE mg/dL
KETONES: NEGATIVE mg/dL
LEUKOCYTES: NEGATIVE WBCs/uL
NITRITE: NEGATIVE
PH: 6 (ref 5.0–9.0)
PROTEIN: 100 mg/dL — AB
SPECIFIC GRAVITY: 1.026 (ref 1.002–1.030)
UROBILINOGEN: NORMAL mg/dL

## 2023-07-12 LAB — CBC WITH DIFF
BASOPHIL #: 0.1 10*3/uL (ref 0.00–0.10)
BASOPHIL %: 1 % (ref 0–1)
EOSINOPHIL #: 0.1 10*3/uL (ref 0.00–0.50)
EOSINOPHIL %: 1 % (ref 1–7)
HCT: 35.6 % (ref 31.2–41.9)
HGB: 11.3 g/dL (ref 10.9–14.3)
LYMPHOCYTE #: 2 10*3/uL (ref 1.10–3.10)
LYMPHOCYTE %: 21 % (ref 16–46)
MCH: 24.9 pg (ref 24.7–32.8)
MCHC: 31.6 g/dL — ABNORMAL LOW (ref 32.3–35.6)
MCV: 78.7 fL (ref 75.5–95.3)
MONOCYTE #: 0.7 10*3/uL (ref 0.20–0.90)
MONOCYTE %: 7 % (ref 4–11)
MPV: 8.9 fL (ref 7.9–10.8)
NEUTROPHIL #: 6.6 10*3/uL (ref 1.90–8.20)
NEUTROPHIL %: 70 % (ref 43–77)
PLATELETS: 288 10*3/uL (ref 140–440)
RBC: 4.53 10*6/uL (ref 3.63–4.92)
RDW: 16 % (ref 12.3–17.7)
WBC: 9.4 10*3/uL (ref 3.8–11.8)

## 2023-07-12 LAB — COMPREHENSIVE METABOLIC PANEL, NON-FASTING
ALBUMIN/GLOBULIN RATIO: 1.2 (ref 0.8–1.4)
ALBUMIN: 3.8 g/dL (ref 3.5–5.7)
ALKALINE PHOSPHATASE: 103 U/L (ref 34–104)
ALT (SGPT): 37 U/L (ref 7–52)
ANION GAP: 12 mmol/L (ref 4–13)
AST (SGOT): 35 U/L (ref 13–39)
BILIRUBIN TOTAL: 0.5 mg/dL (ref 0.3–1.0)
BUN/CREA RATIO: 20 (ref 6–22)
BUN: 18 mg/dL (ref 7–25)
CALCIUM, CORRECTED: 9.6 mg/dL (ref 8.9–10.8)
CALCIUM: 9.4 mg/dL (ref 8.6–10.3)
CHLORIDE: 105 mmol/L (ref 98–107)
CO2 TOTAL: 23 mmol/L (ref 21–31)
CREATININE: 0.9 mg/dL (ref 0.60–1.30)
ESTIMATED GFR: 79 mL/min/{1.73_m2} (ref >59)
GLOBULIN: 3.3 (ref 2.0–3.5)
GLUCOSE: 116 mg/dL — ABNORMAL HIGH (ref 74–109)
OSMOLALITY, CALCULATED: 282 mosm/kg (ref 270–290)
POTASSIUM: 3.5 mmol/L (ref 3.5–5.1)
PROTEIN TOTAL: 7.1 g/dL (ref 6.4–8.9)
SODIUM: 140 mmol/L (ref 136–145)

## 2023-07-12 LAB — URINALYSIS, MICROSCOPIC
BACTERIA: NEGATIVE /[HPF]
NON-SQUAMOUS EPITHELIAL CELLS URINE: <1 /[HPF] (ref <=1)
RBCS: 15 /[HPF] — ABNORMAL HIGH (ref <4)
SQUAMOUS EPITHELIAL: 10 /[HPF] (ref <28)
WBCS: 1 /[HPF] (ref <6)

## 2023-07-12 LAB — GRAY TOP TUBE

## 2023-07-12 LAB — GOLD TOP TUBE

## 2023-07-12 LAB — BLUE TOP TUBE

## 2023-07-12 MED ORDER — DEXAMETHASONE SODIUM PHOSPHATE 4 MG/ML INJECTION SOLUTION
INTRAMUSCULAR | Status: AC
Start: 2023-07-12 — End: 2023-07-12
  Filled 2023-07-12: qty 1

## 2023-07-12 MED ORDER — TRAMADOL 50 MG TABLET
ORAL_TABLET | ORAL | Status: AC
Start: 2023-07-12 — End: 2023-07-12
  Filled 2023-07-12: qty 1

## 2023-07-12 MED ORDER — IOHEXOL 350 MG IODINE/ML INTRAVENOUS SOLUTION
75.0000 mL | INTRAVENOUS | Status: AC
Start: 2023-07-12 — End: 2023-07-12
  Administered 2023-07-12: 75 mL via INTRAVENOUS

## 2023-07-12 MED ORDER — DEXAMETHASONE SODIUM PHOSPHATE 4 MG/ML INJECTION SOLUTION
4.0000 mg | INTRAMUSCULAR | Status: AC
Start: 2023-07-12 — End: 2023-07-12
  Administered 2023-07-12: 4 mg via INTRAVENOUS

## 2023-07-12 MED ORDER — TRAMADOL 50 MG TABLET
50.0000 mg | ORAL_TABLET | ORAL | Status: AC
Start: 2023-07-12 — End: 2023-07-12
  Administered 2023-07-12: 50 mg via ORAL

## 2023-07-12 MED ORDER — TRAMADOL 50 MG TABLET
1.0000 | ORAL_TABLET | Freq: Four times a day (QID) | ORAL | 0 refills | Status: AC | PRN
Start: 2023-07-12 — End: ?

## 2023-07-12 NOTE — ED Provider Notes (Signed)
[facility]  ED Primary Provider Note  Patient Name: Jennifer [name]  Patient Age: 48 y.o.  Date of Birth: [birth year]    Chief Complaint: Fall        History of Present Illness       Jennifer [name] is a 48 y.o. female who had concerns including Fall. ***        Review of Systems     No other overt Review of Systems are noted to be positive except noted in the HPI.    { Be sure to review and modify ROS as appropriate. As of May 30, 2021 you are only required to have a "medically appropriate" ROS and PE for billing purposes. This help text will disappear when signing your note.:123}  Historical Data   History Reviewed This Encounter: Medical History  Surgical History  Family History  Social History      Physical Exam   ED Triage Vitals [07/12/23 1637]   BP (Non-Invasive) (!) 179/102   Heart Rate (!) 110   Respiratory Rate 20   Temperature 36.6 C (97.8 F)   SpO2 98 %   Weight 104 kg (230 lb)   Height 1.702 m (5\' 7" )     {Be sure to review and modify Physical Exam as appropriate. As of May 30, 2021 you are only required to have a "medically appropriate" ROS and PE for billing purposes. This help text will disappear when signing your note.:123}    Nursing notes reviewed for what could be assessed. Past Medical, Surgical, and Social history reviewed for what has been completed.     Constitutional: NAD. Well-Developed. Well Nourished.  Head: Normocephalic, atraumatic.  Mouth/Throat: no nasal discharge, posterior pharynx WNL  Eyes: EOM grossly intact, conjunctiva normal.  Neck: Supple  Cardiovascular: Regular Rate and Rhythm, extremities well perfused.  Pulmonary/Chest: No respiratory distress. Lungs are symmetric to auscultation bilaterally.  Abdominal: Soft, non-tender, non-distended. Non peritoneal, no rebound, no guarding.  MSK: No Lower Extremity Edema.  Skin: Warm, dry, and intact  Neuro: Appropriate, CN II-XII grossly intact   Psych: Cooperative           Procedures      Patient  Data   {Click here to open the ED Workup Activity for clinical data review *This link will automatically disappear upon signing your note*:123}  Labs Ordered/Reviewed   COMPREHENSIVE METABOLIC PANEL, NON-FASTING - Abnormal; Notable for the following components:       Result Value    GLUCOSE 116 (*)     All other components within normal limits    Narrative:     Estimated Glomerular Filtration Rate (eGFR) is calculated using the CKD-EPI (2021) equation, intended for patients 56 years of age and older. If gender is not documented or "unknown", there will be no eGFR calculation.     CBC WITH DIFF - Abnormal; Notable for the following components:    MCHC 31.6 (*)     All other components within normal limits   URINALYSIS, MACROSCOPIC - Abnormal; Notable for the following components:    PROTEIN 100 (*)     BLOOD 0.2 (*)     All other components within normal limits   URINALYSIS, MICROSCOPIC - Abnormal; Notable for the following components:    BUDDING YEAST Rare (*)     RBCS 15 (*)     All other components within normal limits   URINE CULTURE,ROUTINE   CBC/DIFF    Narrative:  The following orders were created for panel order CBC/DIFF.  Procedure                               Abnormality         Status                     ---------                               -----------         ------                     CBC WITH ZOXW[[id number]]                Abnormal            Final result                 Please view results for these tests on the individual orders.   URINALYSIS, MACROSCOPIC AND MICROSCOPIC W/CULTURE REFLEX    Narrative:     The following orders were created for panel order URINALYSIS, MACROSCOPIC AND MICROSCOPIC W/CULTURE REFLEX.  Procedure                               Abnormality         Status                     ---------                               -----------         ------                     URINALYSIS, MACROSCOPIC[[id number]]      Abnormal            Final result               URINALYSIS,  MICROSCOPIC[[id number]]      Abnormal            Final result                 Please view results for these tests on the individual orders.   BLUE TOP TUBE   GRAY TOP TUBE   EXTRA TUBES    Narrative:     The following orders were created for panel order EXTRA TUBES.  Procedure                               Abnormality         Status                     ---------                               -----------         ------                     Cyndee Brightly TOP JXBJ[[id number]]  Final result               GOLD TOP IHKV[[id number]]                                    In process                 GRAY TOP FIEP[[id number]]                                    Final result                 Please view results for these tests on the individual orders.   GOLD TOP TUBE       CT ABDOMEN PELVIS W IV CONTRAST   Final Result by Edi, Radresults In (02/12 1939)   NO ACUTE FINDINGS AT THE ABDOMEN OR PELVIS ON CONTRAST-ENHANCED CT.          One or more dose reduction techniques were used (e.g., Automated exposure control, adjustment of the mA and/or kV according to patient size, use of iterative reconstruction technique).         Radiologist location ID: [id number]         XR HIP RIGHT W PELVIS 2-3 VIEWS   Final Result by Edi, Radresults In (02/12 1810)   NEGATIVE HIP SERIES.                Radiologist location ID: [id number]             Medical Decision Making        {Be sure to fill out the MDM SmartBlock in Notewriter to the left. Do not modify this italicized text, it will disappear upon signing your note:123}  Medical Decision Making        Studies Assessed: ***    EKG:   This EKG interpreted by me shows:    Rate: ***    Interpretation: ***      MDM Narrative:  ***      ED Course as of 07/12/23 2039   Wed Jul 12, 2023   1709 WBC 9.4, HEMOGLOBIN 11.3, PLATELET COUNT 288   1724 SODIUM 140, POTASSIUM 3.5, BUN 18, CREATININE 0.90, GLUCOSE 116, TOTAL BILIRUBIN 0.5, AST 35, ALT 37, TOTAL ALKALINE PHOSPHATASE 103   1820  X-RAY IMAGING OF THE RIGHT HIP AND PELVIS REVEALED:   FINDINGS:   No fracture.  No suspicious bone lesion.  Normal alignment.  Soft tissues are unremarkable.        IMPRESSION:  NEGATIVE HIP SERIES.   1926 URINALYSIS DID NOT REVEAL EVIDENCE OF URINARY TRACT INFECTION   1954 CT IMAGING OF THE ABDOMEN WITH IV CONTRAST REVEALED:   FINDINGS:  Lung bases: Clear     Liver:   Unremarkable.     Gallbladder:   Surgically absent.     Spleen:   Unremarkable.     Pancreas:   Unremarkable.     Adrenals:   Unremarkable.     Kidneys:   Normal renal sizes. No hydronephrosis.      Bladder:  Unremarkable.     Uterus and Adnexa:  Tubal ligation clips     Bowel:   Unremarkable.     Appendix:  Normal.     Lymph nodes:  No suspicious lymph node enlargement.     Vasculature:  Major vascular structures are unremarkable.      Peritoneum / Retroperitoneum: Trace low-density free fluid at the left pelvic cul-de-sac, presumed physiologic.     Bones:   Mild L3-4 degenerative disc disease. No acute findings.        IMPRESSION:  NO ACUTE FINDINGS AT THE ABDOMEN OR PELVIS ON CONTRAST-ENHANCED CT.          Medications Administered in the ED   dexAMETHasone 4 mg/mL injection (has no administration in time range)   traMADol (ULTRAM) tablet (has no administration in time range)   iohexol (OMNIPAQUE 350) infusion (75 mL Intravenous Given 07/12/23 1925)       Following the history, physical exam, and ED workup, the patient was deemed stable and suitable for discharge. The patient/caregiver was advised to return to the ED for any new or worsening symptoms. Discharge medications, and follow-up instructions were discussed with the patient/caregiver in detail, who verbalizes understanding. The patient/caregiver is in agreement and is comfortable with the plan of care.    Disposition: Discharged         Current Discharge Medication List        START taking these medications.        Details   traMADoL 50 mg Tablet  Commonly known as: ULTRAM   50 mg, Oral,  EVERY 6 HOURS PRN  Qty: 12 Tablet  Refills: 0            CONTINUE these medications - NO CHANGES were made during your visit.        Details   albuterol sulfate 90 mcg/actuation oral inhaler  Commonly known as: PROVENTIL or VENTOLIN or PROAIR   1-2 Puffs, Inhalation, EVERY 6 HOURS PRN  Qty: 18 g  Refills: 2     amLODIPine 2.5 mg Tablet  Commonly known as: NORVASC   2.5 mg, Oral, DAILY  Refills: 0     [place]our Thyroid 240 mg Tablet  Generic drug: thyroid   240 mg, Oral, DAILY  Refills: 0     atorvastatin 20 mg Tablet  Commonly known as: LIPITOR   20 mg, Oral, EVERY EVENING  Refills: 0     budesonide-formoteroL 160-4.5 mcg/actuation oral inhaler  Commonly known as: Symbicort   2 Puffs, Inhalation, 2 TIMES DAILY, Rinse mouth after each use  Qty: 10.2 g  Refills: 2     DULoxetine 30 mg Capsule, Delayed Release(E.C.)  Commonly known as: CYMBALTA DR   30 mg, Oral, DAILY  Refills: 0     ergocalciferol (vitamin D2) 1,250 mcg (50,000 unit) Capsule  Commonly known as: DRISDOL   50,000 Units, Oral, EVERY 7 DAYS  Refills: 0     gabapentin 800 mg Tablet  Commonly known as: NEURONTIN   800 mg, Oral, 3 TIMES DAILY  Refills: 0     insulin glargine 100 unit/mL injection (vial)   15 Units, Subcutaneous, EVERY EVENING  Refills: 0     ipratropium-albuteroL 0.5 mg-3 mg(2.5 mg base)/3 mL nebulizer solution  Commonly known as: DUONEB   USE 1 VIAL IN NEBULIZER 4 TIMES DAILY AS NEEDED  Refills: 0     lisinopriL-hydrochlorothiazide 20-12.5 mg Tablet  Commonly known as: ZESTORETIC   1 Tablet, Oral, DAILY  Refills: 0     loratadine 10 mg Tablet  Commonly known as: CLARITIN   10 mg, Oral, DAILY  Refills: 0     magnesium oxide 400 mg Tablet  Commonly known as: MAG-OX   400 mg, Oral, DAILY  Refills: 0  montelukast 10 mg Tablet  Commonly known as: SINGULAIR   10 mg, Oral, EVERY EVENING  Refills: 0     naproxen 500 mg Tablet  Commonly known as: NAPROSYN   500 mg, Oral, 2 TIMES DAILY WITH FOOD  Refills: 0     omeprazole 40 mg Capsule, Delayed  Release(E.C.)  Commonly known as: PRILOSEC   40 mg, Oral, DAILY  Refills: 0     Ozempic 1 mg/dose (4 mg/3 mL) Pen Injector  Generic drug: semaglutide   1 mg, Subcutaneous, EVERY 7 DAYS  Refills: 0     Spiriva Respimat 2.5 mcg/actuation oral inhaler  Generic drug: tiotropium bromide   2 Puffs, Inhalation, DAILY  Qty: 4 g  Refills: 2     triamcinolone acetonide 0.1 % Cream   Topical, 2 TIMES DAILY  Refills: 0            ASK your doctor about these medications.        Details   azithromycin 250 mg Tablet  Commonly known as: ZITHROMAX  Ask about: Should I take this medication?   250 mg, Oral, DAILY  Qty: 4 Tablet  Refills: 0     benzonatate 100 mg Capsule  Commonly known as: TESSALON  Ask about: Should I take this medication?   100 mg, Oral, EVERY 8 HOURS PRN  Qty: 9 Capsule  Refills: 0     cefdinir 300 mg Capsule  Commonly known as: OMNICEF  Ask about: Should I take this medication?   300 mg, Oral, 2 TIMES DAILY  Qty: 12 Capsule  Refills: 0     predniSONE 20 mg Tablet  Commonly known as: DELTASONE  Ask about: Should I take this medication?   40 mg, Oral, DAILY  Qty: 10 Tablet  Refills: 0            Follow up:   Romie Levee, CRNP  [address]  [place] [address]  [phone]    In 1 day      [facility] - Emergency Department  [address]  [place] [address]  [phone]    As needed, If symptoms worsen               Clinical Impression   Fall (Primary)   Low back pain   Lumbar paraspinal muscle spasm   Pain in right hip   Hypertension, unspecified type         Current Discharge Medication List        START taking these medications    Details   traMADoL (ULTRAM) 50 mg Oral Tablet Take 1 Tablet (50 mg total) by mouth Every 6 hours as needed for Pain  Qty: 12 Tablet, Refills: 0               Tawanna Sat, DO              {Remember to refresh your note prior to signing. Use Control + F11 or click the refresh button at the bottom of the note. This reminder text will  automatically disappear when you sign your note.:123}

## 2023-07-12 NOTE — ED Triage Notes (Signed)
STATES FELL DOWN APPROX 4 STEPS 3 DAYS AGO. C/O LOW BACK AND R HIP PAIN.

## 2023-07-12 NOTE — ED Attending Handoff Note (Signed)
[facility] - Emergency Department  Emergency Department  Provider in Triage Note    Name: Jennifer [name]  Age: 48 y.o.  Gender: female     Subjective:   Jennifer [name] is a 48 y.o. female who presents with complaint of Fall  .  This occurred 3 days prior.    Objective:   Filed Vitals:    07/12/23 1637   BP: (!) 179/102   Pulse: (!) 110   Resp: 20   Temp: 36.6 C (97.8 F)   SpO2: 98%      Vitals are also documented in the EMR.  Focused Physical Exam shows ambulatory 48 y.o. who is conversive.    Assessment:  A medical screening exam was completed.  This patient is a 48 y.o. female with Fall  .    Plan:  Please see initial orders and work-up in the EMR.  This is to be continued with full evaluation in the main Emergency Department.     No current facility-administered medications for this encounter.     Results for orders placed or performed during the hospital encounter of 07/12/23 (from the past 24 hours)   CBC/DIFF    Narrative    The following orders were created for panel order CBC/DIFF.  Procedure                               Abnormality         Status                     ---------                               -----------         ------                     CBC WITH ZOXW[[id number]]                                                                 Please view results for these tests on the individual orders.   URINALYSIS, MACROSCOPIC AND MICROSCOPIC W/CULTURE REFLEX    Specimen: Urine, Clean Catch    Narrative    The following orders were created for panel order URINALYSIS, MACROSCOPIC AND MICROSCOPIC W/CULTURE REFLEX.  Procedure                               Abnormality         Status                     ---------                               -----------         ------                     URINALYSIS, MACROSCOPIC[[id number]]  URINALYSIS, MICROSCOPIC[[id number]]                                                       Please view results for  these tests on the individual orders.          /R. Tobey Bride, MD, Lacie Scotts   07/12/2023, 16:38   Department of Emergency Medicine   Medicine - [facility]

## 2023-07-12 NOTE — ED Nurses Note (Signed)
Pt evaluated and treated by medical provider while in waiting room area. No primary RN assigned; therefore, no assessment performed. All paperwork given and questions answered. Pt acknowledged all understanding. Leaving waiting area.

## 2023-07-13 ENCOUNTER — Ambulatory Visit (HOSPITAL_COMMUNITY): Payer: Self-pay | Admitting: PHYSICIAN ASSISTANT

## 2023-07-14 LAB — URINE CULTURE,ROUTINE: URINE CULTURE: 5000 — AB

## 2023-07-24 ENCOUNTER — Ambulatory Visit
Admission: RE | Admit: 2023-07-24 | Discharge: 2023-07-24 | Disposition: A | Discharge status: Home / Self Care | Source: Ambulatory Visit | Attending: SLEEP MEDICINE

## 2023-07-24 ENCOUNTER — Other Ambulatory Visit: Payer: Self-pay

## 2023-07-24 DIAGNOSIS — J189 Pneumonia, unspecified organism: Secondary | ICD-10-CM | POA: Insufficient documentation

## 2023-07-24 DIAGNOSIS — J441 Chronic obstructive pulmonary disease with (acute) exacerbation: Secondary | ICD-10-CM | POA: Insufficient documentation

## 2023-08-01 ENCOUNTER — Other Ambulatory Visit (HOSPITAL_COMMUNITY): Payer: Self-pay | Admitting: NURSE PRACTITIONER

## 2023-08-01 ENCOUNTER — Encounter (HOSPITAL_COMMUNITY): Payer: Self-pay | Admitting: SLEEP MEDICINE

## 2023-08-01 ENCOUNTER — Ambulatory Visit: Attending: SLEEP MEDICINE | Admitting: SLEEP MEDICINE

## 2023-08-01 ENCOUNTER — Other Ambulatory Visit: Payer: Self-pay

## 2023-08-01 VITALS — BP 153/86 | HR 106 | Temp 97.3°F | Resp 20 | Ht 67.0 in | Wt 258.0 lb

## 2023-08-01 DIAGNOSIS — F419 Anxiety disorder, unspecified: Secondary | ICD-10-CM | POA: Insufficient documentation

## 2023-08-01 DIAGNOSIS — G4733 Obstructive sleep apnea (adult) (pediatric): Secondary | ICD-10-CM | POA: Insufficient documentation

## 2023-08-01 DIAGNOSIS — I10 Essential (primary) hypertension: Secondary | ICD-10-CM

## 2023-08-01 DIAGNOSIS — E119 Type 2 diabetes mellitus without complications: Secondary | ICD-10-CM | POA: Insufficient documentation

## 2023-08-01 DIAGNOSIS — R0600 Dyspnea, unspecified: Secondary | ICD-10-CM

## 2023-08-01 DIAGNOSIS — Z6841 Body Mass Index (BMI) 40.0 and over, adult: Secondary | ICD-10-CM | POA: Insufficient documentation

## 2023-08-01 DIAGNOSIS — J189 Pneumonia, unspecified organism: Secondary | ICD-10-CM | POA: Insufficient documentation
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
## 2023-08-04 DIAGNOSIS — F419 Anxiety disorder, unspecified: Secondary | ICD-10-CM | POA: Insufficient documentation

## 2023-08-04 DIAGNOSIS — Z6841 Body Mass Index (BMI) 40.0 and over, adult: Secondary | ICD-10-CM | POA: Insufficient documentation

## 2023-08-04 DIAGNOSIS — G4733 Obstructive sleep apnea (adult) (pediatric): Secondary | ICD-10-CM | POA: Insufficient documentation

## 2023-08-04 NOTE — Assessment & Plan Note (Signed)
 resolved

## 2023-08-11 ENCOUNTER — Other Ambulatory Visit: Payer: Self-pay

## 2023-08-11 ENCOUNTER — Ambulatory Visit
Admission: RE | Admit: 2023-08-11 | Discharge: 2023-08-11 | Disposition: A | Discharge status: Home / Self Care | Source: Ambulatory Visit | Attending: NURSE PRACTITIONER | Admitting: NURSE PRACTITIONER

## 2023-08-11 ENCOUNTER — Inpatient Hospital Stay (HOSPITAL_COMMUNITY)
Admission: RE | Admit: 2023-08-11 | Discharge: 2023-08-11 | Disposition: A | Discharge status: Home / Self Care | Source: Ambulatory Visit | Attending: NURSE PRACTITIONER

## 2023-08-11 ENCOUNTER — Ambulatory Visit (HOSPITAL_COMMUNITY)
Admission: RE | Admit: 2023-08-11 | Discharge: 2023-08-11 | Disposition: A | Discharge status: Home / Self Care | Source: Ambulatory Visit | Attending: NURSE PRACTITIONER

## 2023-08-11 ENCOUNTER — Ambulatory Visit (HOSPITAL_COMMUNITY)
Admission: RE | Admit: 2023-08-11 | Discharge: 2023-08-11 | Disposition: A | Discharge status: Home / Self Care | Source: Ambulatory Visit | Attending: NURSE PRACTITIONER | Admitting: NURSE PRACTITIONER

## 2023-08-11 DIAGNOSIS — R0600 Dyspnea, unspecified: Secondary | ICD-10-CM | POA: Insufficient documentation

## 2023-08-11 DIAGNOSIS — I10 Essential (primary) hypertension: Secondary | ICD-10-CM | POA: Insufficient documentation

## 2023-08-11 MED ORDER — REGADENOSON 0.4 MG/5 ML INTRAVENOUS SYRINGE
0.4000 mg | INJECTION | Freq: Once | INTRAVENOUS | Status: AC
Start: 2023-08-11 — End: 2023-08-11
  Administered 2023-08-11: 0.4 mg via INTRAVENOUS
  Filled 2023-08-11: qty 5

## 2023-08-25 ENCOUNTER — Emergency Department (HOSPITAL_COMMUNITY)

## 2023-08-25 ENCOUNTER — Encounter (HOSPITAL_COMMUNITY): Payer: Self-pay | Admitting: Nurse Practitioner

## 2023-08-25 ENCOUNTER — Emergency Department
Admission: EM | Admit: 2023-08-25 | Discharge: 2023-08-25 | Disposition: A | Discharge status: Home / Self Care | Source: Home / Self Care | Attending: Nurse Practitioner | Admitting: Nurse Practitioner

## 2023-08-25 ENCOUNTER — Other Ambulatory Visit: Payer: Self-pay

## 2023-08-25 DIAGNOSIS — E876 Hypokalemia: Secondary | ICD-10-CM | POA: Insufficient documentation

## 2023-08-25 DIAGNOSIS — J449 Chronic obstructive pulmonary disease, unspecified: Secondary | ICD-10-CM | POA: Insufficient documentation

## 2023-08-25 DIAGNOSIS — M25571 Pain in right ankle and joints of right foot: Secondary | ICD-10-CM | POA: Insufficient documentation

## 2023-08-25 DIAGNOSIS — X58XXXA Exposure to other specified factors, initial encounter: Secondary | ICD-10-CM

## 2023-08-25 DIAGNOSIS — M25579 Pain in unspecified ankle and joints of unspecified foot: Secondary | ICD-10-CM

## 2023-08-25 DIAGNOSIS — T781XXA Other adverse food reactions, not elsewhere classified, initial encounter: Secondary | ICD-10-CM

## 2023-08-25 DIAGNOSIS — T7840XA Allergy, unspecified, initial encounter: Secondary | ICD-10-CM | POA: Insufficient documentation

## 2023-08-25 DIAGNOSIS — M25572 Pain in left ankle and joints of left foot: Secondary | ICD-10-CM | POA: Insufficient documentation

## 2023-08-25 LAB — CBC
HCT: 33.9 % (ref 31.2–41.9)
HGB: 11.2 g/dL (ref 10.9–14.3)
MCH: 25.5 pg (ref 24.7–32.8)
MCHC: 33 g/dL (ref 32.3–35.6)
MCV: 77.1 fL (ref 75.5–95.3)
MPV: 9 fL (ref 7.9–10.8)
PLATELETS: 307 10*3/uL (ref 140–440)
RBC: 4.39 10*6/uL (ref 3.63–4.92)
RDW: 16.4 % (ref 12.3–17.7)
WBC: 9.3 10*3/uL (ref 3.8–11.8)

## 2023-08-25 LAB — COMPREHENSIVE METABOLIC PANEL, NON-FASTING
ALBUMIN/GLOBULIN RATIO: 1.4 (ref 0.8–1.4)
ALBUMIN: 4 g/dL (ref 3.5–5.7)
ALKALINE PHOSPHATASE: 84 U/L (ref 34–104)
ALT (SGPT): 37 U/L (ref 7–52)
ANION GAP: 8 mmol/L (ref 4–13)
AST (SGOT): 32 U/L (ref 13–39)
BILIRUBIN TOTAL: 0.3 mg/dL (ref 0.3–1.0)
BUN/CREA RATIO: 17 (ref 6–22)
BUN: 13 mg/dL (ref 7–25)
CALCIUM, CORRECTED: 9.2 mg/dL (ref 8.9–10.8)
CALCIUM: 9.2 mg/dL (ref 8.6–10.3)
CHLORIDE: 101 mmol/L (ref 98–107)
CO2 TOTAL: 27 mmol/L (ref 21–31)
CREATININE: 0.78 mg/dL (ref 0.60–1.30)
ESTIMATED GFR: 94 mL/min/{1.73_m2} (ref >59)
GLOBULIN: 2.8 (ref 2.0–3.5)
GLUCOSE: 221 mg/dL — ABNORMAL HIGH (ref 74–109)
OSMOLALITY, CALCULATED: 279 mosm/kg (ref 270–290)
POTASSIUM: 3.2 mmol/L — ABNORMAL LOW (ref 3.5–5.1)
PROTEIN TOTAL: 6.8 g/dL (ref 6.4–8.9)
SODIUM: 136 mmol/L (ref 136–145)

## 2023-08-25 LAB — D-DIMER: D-DIMER: 313 ng{FEU}/mL (ref 215–500)

## 2023-08-25 MED ORDER — POTASSIUM CHLORIDE ER 20 MEQ TABLET,EXTENDED RELEASE(PART/CRYST)
40.0000 meq | ORAL_TABLET | ORAL | Status: AC
Start: 2023-08-25 — End: 2023-08-25
  Administered 2023-08-25: 40 meq via ORAL

## 2023-08-25 MED ORDER — DIPHENHYDRAMINE 50 MG/ML INJECTION SOLUTION
INTRAMUSCULAR | Status: AC
Start: 2023-08-25 — End: 2023-08-25
  Filled 2023-08-25: qty 1

## 2023-08-25 MED ORDER — DIPHENHYDRAMINE 50 MG/ML INJECTION SOLUTION
50.0000 mg | INTRAMUSCULAR | Status: AC
Start: 2023-08-25 — End: 2023-08-25
  Administered 2023-08-25: 50 mg via INTRAVENOUS

## 2023-08-25 MED ORDER — METHYLPREDNISOLONE SOD SUCC 125 MG SOLUTION FOR INJECTION WRAPPER
125.0000 mg | INTRAVENOUS | Status: AC
Start: 2023-08-25 — End: 2023-08-25
  Administered 2023-08-25: 125 mg via INTRAVENOUS

## 2023-08-25 MED ORDER — FAMOTIDINE (PF) 20 MG/2 ML INTRAVENOUS SOLUTION
INTRAVENOUS | Status: AC
Start: 2023-08-25 — End: 2023-08-25
  Filled 2023-08-25: qty 2

## 2023-08-25 MED ORDER — POTASSIUM CHLORIDE ER 20 MEQ TABLET,EXTENDED RELEASE(PART/CRYST)
ORAL_TABLET | ORAL | Status: AC
Start: 2023-08-25 — End: 2023-08-25
  Filled 2023-08-25: qty 2

## 2023-08-25 MED ORDER — DIPHENHYDRAMINE 25 MG CAPSULE
25.0000 mg | ORAL_CAPSULE | Freq: Four times a day (QID) | ORAL | 0 refills | Status: DC | PRN
Start: 2023-08-25 — End: 2023-11-23

## 2023-08-25 MED ORDER — METHYLPREDNISOLONE 4 MG TABLETS IN A DOSE PACK
ORAL_TABLET | ORAL | 0 refills | Status: DC
Start: 2023-08-25 — End: 2023-10-17

## 2023-08-25 MED ORDER — FAMOTIDINE (PF) 20 MG/2 ML INTRAVENOUS SOLUTION
20.0000 mg | INTRAVENOUS | Status: AC
Start: 2023-08-25 — End: 2023-08-25
  Administered 2023-08-25: 20 mg via INTRAVENOUS

## 2023-08-25 MED ORDER — METHYLPREDNISOLONE SOD SUCC 125 MG SOLUTION FOR INJECTION WRAPPER
INTRAVENOUS | Status: AC
Start: 2023-08-25 — End: 2023-08-25
  Filled 2023-08-25: qty 2

## 2023-08-25 NOTE — Discharge Instructions (Signed)
 Thank you for allowing us  to be part of your care.  Drink plenty of water .  Ankle exercises as tolerated.  Take the Medrol  Dosepak and Benadryl  as directed.  Follow-up with your primary care provider by calling their office today to arrange a follow-up appointment.  Return to the emergency department if worse, no improvement, or as needed.  We hope you feel better.

## 2023-08-25 NOTE — ED Triage Notes (Signed)
 Itching all over with "My throat feels like it swollen up" with feel swelling x one hour while at work, "I think I'm having an allergic reaction" unsure of what the source is but ate pizza prior to itching and swelling

## 2023-08-25 NOTE — ED Provider Notes (Signed)
 [facility] - Emergency Department  ED Primary Provider Note  History of Present Illness   Chief Complaint   Patient presents with    Allergic reaction    Itching     Jennifer [name] is a 48 y.o. female who had concerns including Allergic reaction and Itching.  Arrival: The patient arrived by Car      This 48 year old female with history of chronic obstructive pulmonary disease and hypertension presents to the emergency department complaining of itching all over that started an hour and a half prior to arrival here in the emergency department.  She also complains of both ankles has been swollen.  She states that she felt like her throat was swelling.  She states that she thinks she is having allergic reaction to something.  She reports that she ate pizza prior to her symptoms starting.  She also complains of shortness of breath and states that she has chronic obstructive pulmonary disease in his chronic shortness of breath.  She denies any injuries to her ankles.      History provided by:  Patient    History Reviewed This Encounter: Medical History  Surgical History  Family History  Social History    Physical Exam   ED Triage Vitals [08/25/23 0306]   BP (Non-Invasive) (!) 163/97   Heart Rate (!) 105   Respiratory Rate 20   Temperature 36.4 C (97.5 F)   SpO2 100 %   Weight 104 kg (230 lb)   Height 1.702 m (5\' 7" )     Physical Exam  Vitals and nursing note reviewed.   Constitutional:       General: She is not in acute distress.     Appearance: Normal appearance. She is obese. She is not ill-appearing, toxic-appearing or diaphoretic.      Comments: Anxious appearing and scratching her legs.   HENT:      Head: Normocephalic and atraumatic.      Right Ear: External ear normal.      Left Ear: External ear normal.      Nose: Nose normal.      Mouth/Throat:      Mouth: Mucous membranes are moist.      Pharynx: Oropharynx is clear. No oropharyngeal exudate or posterior oropharyngeal erythema.       Comments: No posterior oropharynx swelling noted.  Eyes:      Extraocular Movements: Extraocular movements intact.      Conjunctiva/sclera: Conjunctivae normal.      Pupils: Pupils are equal, round, and reactive to light.   Cardiovascular:      Rate and Rhythm: Normal rate and regular rhythm.      Pulses: Normal pulses.      Heart sounds: Normal heart sounds.   Pulmonary:      Effort: Pulmonary effort is normal. No respiratory distress.      Breath sounds: Normal breath sounds. No stridor. No wheezing, rhonchi or rales.   Abdominal:      General: Abdomen is flat. Bowel sounds are normal. There is no distension.      Tenderness: There is no abdominal tenderness.   Musculoskeletal:         General: Swelling (Questionable mild swelling of both ankles.) and tenderness (Left calf tenderness.) present. Normal range of motion.      Cervical back: Normal range of motion and neck supple. No rigidity or tenderness.   Skin:     General: Skin is warm and dry.  Capillary Refill: Capillary refill takes less than 2 seconds.      Coloration: Skin is not jaundiced or pale.      Findings: No rash.   Neurological:      General: No focal deficit present.      Mental Status: She is alert and oriented to person, place, and time.      Cranial Nerves: No cranial nerve deficit.      Sensory: No sensory deficit.   Psychiatric:         Mood and Affect: Mood normal.         Behavior: Behavior normal.       Patient Data       Labs Ordered/Reviewed   COMPREHENSIVE METABOLIC PANEL, NON-FASTING - Abnormal; Notable for the following components:       Result Value    POTASSIUM 3.2 (*)     GLUCOSE 221 (*)     All other components within normal limits    Narrative:     Estimated Glomerular Filtration Rate (eGFR) is calculated using the CKD-EPI (2021) equation, intended for patients 59 years of age and older. If gender is not documented or "unknown", there will be no eGFR calculation.     CBC - Normal   D-DIMER - Normal    Narrative:     D-Dimers  are reported in FEU per ng/mL.    IF PATIENT IS EXHIBITING SYMPTOMS DVT/PE, THIS D-DIMER RESULT MAY INDICATE A NEED FOR FURTHER TESTING FOR THESE CONDITIONS. IF PATIENT IS SUSPECTED OF DIC AND SYMPTOMS WORSEN OR PERSIST, A REPEAT DIC WORKUP SHOULD BE CONSIDERED.    NOTE: ALTHOUGH THE NORMAL RANGE FOR THIS TEST IS 215-500 ng/mL FEU, LITERATURE RECOMMENDS FURTHER TESTING FOR ANY RESULT >500ng/mL FEU.    A cut off value of 500ng/mL FEU or below can be used as an aid in the diagnosis of Thromboembolism when used in conjunction with the patient's medical history, clinical presentation and other findings. Results of 500ng/mL FEU or below have a negative predictive value of 100%.       No orders to display     Medical Decision Making          Medical Decision Making  This 48 year old female with history of chronic obstructive pulmonary disease and hypertension presents to the emergency department complaining of itching all over that started an hour and a half prior to arrival here in the emergency department.  She also complains of both ankles has been swollen.  She states that she felt like her throat was swelling.  She states that she thinks she is having allergic reaction to something.  She reports that she ate pizza prior to her symptoms starting.  She also complains of shortness of breath and states that she has chronic obstructive pulmonary disease in his chronic shortness of breath.  She denies any injuries to her ankles.  Physical exam reveals a patient who is anxious and scratching her legs.  Her airway is patent with clear and equal bilateral breath sounds.  Skin is warm and dry.  Abdomen is soft.  There was questionable swelling to the bilateral ankles.  No pitting edema noted.  There is mild left calf tenderness.  The patient was initially treated here in the emergency department with diphenhydramine , Pepcid , and methylprednisone.  Labs indicate a WBC of 9.3.  D-dimer 313.  Glucose 221.  Potassium 3.2.  Chest  x-ray showed no acute abnormality pending review from radiologist.  Dr. Annabell Key initially read the  chest x-ray and advised that there was no acute abnormality of the chest x-ray.  Since the D-dimer was normal with is unlikely that the patient is suffering from a deep vein thrombosis.  The patient is most likely suffering from an allergic reaction of unknown etiology, hypokalemia, and acute bilateral ankle pain.  She was treated here in the emergency department with potassium due to a potassium level of 3.2.  She was instructed to follow-up with her primary care provider in 1-3 days and/or return to the emergency department if worse, no improvement, or as needed.          Problems Addressed:  Acute bilateral ankle pain: acute illness or injury  Allergic reaction: acute illness or injury  Hypokalemia: acute illness or injury    Amount and/or Complexity of Data Reviewed  Labs: ordered. Decision-making details documented in ED Course.  Radiology: ordered and independent interpretation performed. Decision-making details documented in ED Course.  ECG/medicine tests: independent interpretation performed.    Risk  OTC drugs.  Prescription drug management.      ED Course as of 08/25/23 0549   Fri Aug 25, 2023   0420 WBC: 9.3  Normal   0420 D-DIMER: 313  Normal   0427 GLUCOSE(!): 221  Abnormal   0427 POTASSIUM(!): 3.2  Abnormal   0457 Alert x3 and in no acute distress.  States that she feels better.   1610 One-view portable chest x-ray shows showed no acute abnormality pending review from the radiologist.  Dr. Annabell Key initially read the x-ray and advised no acute process noted on the chest x-ray.   9604 Alert x3 and in no acute distress.  The patient states that she feels better.  The diagnosis and treatment plan was explained to the patient who verbalized understanding of the discharge instructions.            Medications Ordered/Administered in the ED   diphenhydrAMINE  (BENADRYL ) 50 mg/mL injection (50 mg Intravenous Given  08/25/23 0345)   famotidine  (PEPCID ) 10 mg/mL injection (20 mg Intravenous Given 08/25/23 0345)   methylPREDNISolone  sod succ (SOLU-medrol ) 125 mg/2 mL injection (125 mg Intravenous Given 08/25/23 0345)   potassium chloride  (K-DUR) extended release tablet (40 mEq Oral Given 08/25/23 0531)     Clinical Impression   Allergic reaction (Primary)   Hypokalemia   Acute bilateral ankle pain       Disposition: Discharged

## 2023-08-29 ENCOUNTER — Ambulatory Visit: Attending: INTERVENTIONAL CARDIOLOGY | Admitting: NURSE PRACTITIONER

## 2023-08-29 ENCOUNTER — Encounter (INDEPENDENT_AMBULATORY_CARE_PROVIDER_SITE_OTHER): Payer: Self-pay | Admitting: INTERVENTIONAL CARDIOLOGY

## 2023-08-29 ENCOUNTER — Other Ambulatory Visit: Payer: Self-pay

## 2023-08-29 VITALS — BP 132/80 | HR 86 | Ht 67.0 in | Wt 250.0 lb

## 2023-08-29 DIAGNOSIS — I10 Essential (primary) hypertension: Secondary | ICD-10-CM | POA: Insufficient documentation

## 2023-08-29 DIAGNOSIS — I2089 Other forms of angina pectoris: Secondary | ICD-10-CM | POA: Insufficient documentation

## 2023-08-29 DIAGNOSIS — Z794 Long term (current) use of insulin: Secondary | ICD-10-CM

## 2023-08-29 DIAGNOSIS — Z87891 Personal history of nicotine dependence: Secondary | ICD-10-CM

## 2023-08-29 DIAGNOSIS — R9439 Abnormal result of other cardiovascular function study: Secondary | ICD-10-CM | POA: Insufficient documentation

## 2023-08-29 DIAGNOSIS — Z7982 Long term (current) use of aspirin: Secondary | ICD-10-CM

## 2023-08-29 DIAGNOSIS — R600 Localized edema: Secondary | ICD-10-CM

## 2023-08-29 DIAGNOSIS — E785 Hyperlipidemia, unspecified: Secondary | ICD-10-CM | POA: Insufficient documentation

## 2023-08-29 DIAGNOSIS — I2510 Atherosclerotic heart disease of native coronary artery without angina pectoris: Secondary | ICD-10-CM | POA: Insufficient documentation

## 2023-08-29 LAB — ECG W INTERP (AMB USE ONLY)(MUSE,IN CLINIC)
Atrial Rate: 91 {beats}/min
Calculated P Axis: 79 degrees
Calculated R Axis: -13 degrees
Calculated T Axis: 70 degrees
PR Interval: 152 ms
QRS Duration: 78 ms
QT Interval: 376 ms
QTC Calculation: 462 ms
Ventricular rate: 91 {beats}/min

## 2023-08-29 MED ORDER — LISINOPRIL 20 MG-HYDROCHLOROTHIAZIDE 25 MG TABLET
1.0000 | ORAL_TABLET | Freq: Every day | ORAL | 3 refills | Status: DC
Start: 2023-08-29 — End: 2023-10-24

## 2023-08-29 NOTE — Progress Notes (Signed)
 [facility] Cardiology [facility]     Jennifer [name], [place] y.o. female  Date of Service: 08/29/2023  Date of Birth:  06-23-1975  PCP:  Jennifer [name], CRNP  Chief Complaint   Patient presents with    Hypertension    Establish Care     Abnormal Stress Test, OSA        HPI:    The pt has mild CAD. PMH diabetes mellitus type 2 since 2003, hypothyroidism, hyperlipidemia, depression, obesity, hypertension, gastroesophageal reflux disease, OSA on CPAP, and chronic obstructive pulmonary disease.  She is [name] former smoker. 11/2017 LHC Mid LAD lesion 30%, LVEDP normal, normal RHC. 07/2023 MPS stress test  reported as abnormal for reversible moderate intensity anterior wall LV perfusion defect possible ischemia.  However shifting breast shadow therefore likely artifact. CT correction not available,  EF 70%.  Echocardiogram EF 64%, no wall motion abnormalities identified, diastolic dysfunction indeterminate,  no valve disease reported.    08/29/2023 The patient presents to establish care for abnormal stress test.  She complains of lower extremity edema and chronic shortness of breath with recent worsening.  She reports she is having difficulty with ADLs due to dyspnea.  She reports she was told she had HF when she lived in [place] . She has not had [name] cardiologist since moving to this area 3 years ago.  She also reports cardiac catheterization around 2019 that showed mild CAD.     EKG: SR 91bpm, low voltage qrs  LAB: 07/2023 Hgb 11.3, Plt 296,  LDL 89, A1c 8.7,  Cr 0.67    Past Medical History:   Diagnosis Date    Asthma     COPD (chronic obstructive pulmonary disease)     Diabetes mellitus, type 2     HTN (hypertension)     Narcolepsy     OSA (obstructive sleep apnea)     CPAP    Thyroid  disease        Past Surgical History:   Procedure Laterality Date    HX CHOLECYSTECTOMY      HX TUBAL LIGATION      SHOULDER SURGERY Bilateral        Current Outpatient Medications   Medication Sig    acarbose (PRECOSE) 100 mg Oral Tablet  Take 1 Tablet (100 mg total) by mouth Twice daily    albuterol  sulfate (PROVENTIL  OR VENTOLIN  OR PROAIR ) 90 mcg/actuation Inhalation oral inhaler Take 1-2 Puffs by inhalation Every 6 hours as needed    atorvastatin  (LIPITOR) 20 mg Oral Tablet Take 1 Tablet (20 mg total) by mouth Every evening    budesonide -formoteroL  (SYMBICORT ) 160-4.5 mcg/actuation Inhalation oral inhaler Take 2 Puffs by inhalation Twice daily Rinse mouth after each use    dilTIAZem (CARDIZEM SR) 60 mg Oral Capsule, Sust. Release 12 hr Take 1 Capsule (60 mg total) by mouth Twice daily    diphenhydrAMINE  (BENADRYL ) 25 mg Oral Capsule Take 1 Capsule (25 mg total) by mouth Every 6 hours as needed    doxycycline hyclate (VIBRAMYCIN) 100 mg Oral Capsule Take 1 Capsule (100 mg total) by mouth Twice daily    DULoxetine (CYMBALTA DR) 30 mg Oral Capsule, Delayed Release(E.C.) Take 1 Capsule (30 mg total) by mouth Daily    ergocalciferol, vitamin D2, (DRISDOL) 1,250 mcg (50,000 unit) Oral Capsule Take 1 Capsule (50,000 Units total) by mouth Every 7 days    gabapentin  (NEURONTIN ) 800 mg Oral Tablet Take 1 Tablet (800 mg total) by mouth Three times [name] day  insulin  glargine 100 unit/mL Subcutaneous injection (vial) Inject 15 Units under the skin Every evening    ipratropium-albuterol  0.5 mg-3 mg(2.5 mg base)/3 mL Solution for Nebulization USE 1 VIAL IN NEBULIZER 4 TIMES DAILY AS NEEDED    lisinopriL -hydrochlorothiazide  (ZESTORETIC ) 20-25 mg Oral Tablet Take 1 Tablet by mouth Daily    loratadine (CLARITIN) 10 mg Oral Tablet Take 1 Tablet (10 mg total) by mouth Daily    magnesium  oxide (MAG-OX) 400 mg Oral Tablet Take 1 Tablet (400 mg total) by mouth Daily    Methylprednisolone  (MEDROL  DOSEPACK) 4 mg Oral Tablets, Dose Pack Take as instructed.    montelukast  (SINGULAIR ) 10 mg Oral Tablet Take 1 Tablet (10 mg total) by mouth Every evening    naproxen  (NAPROSYN ) 500 mg Oral Tablet Take 1 Tablet (500 mg total) by mouth Twice daily with food    omeprazole  (PRILOSEC) 40 mg Oral Capsule, Delayed Release(E.C.) Take 1 Capsule (40 mg total) by mouth Daily    OZEMPIC 1 mg/dose (4 mg/3 mL) Subcutaneous Pen Injector Inject 1 mg under the skin Every 7 days    thyroid  (ARMOUR THYROID ) 180 mg Oral Tablet Take 1 Tablet (180 mg total) by mouth Daily    tiotropium bromide (SPIRIVA  RESPIMAT) 2.5 mcg/actuation Inhalation oral inhaler Take 2 Inhalations (2 Puffs total) by inhalation Once [name] day    triamcinolone acetonide 0.1 % Cream Apply topically Twice daily     ROS: Other than issues noted in HPI, all other systems were negative.     Exam:  Vitals:    08/29/23 0946   BP: 132/80   Pulse: 86   SpO2: 97%   Weight: 113 kg (250 lb)   Height: 1.702 m (5\' 7" )   BMI: 39.16       General: No acute distress and appears stated age.Obese, Tearful at times    Neck: No JVD, no carotid bruit. and supple, symmetrical, trachea midline.   Lungs: Clear to diminished auscultation bilaterally.    Cardiovascular: Regular rate and rhythm, normal S1 S2, no murmur, no rub, or gallop, no thrill     Abdomen: Soft, non-tender and bowel sounds normal.    Extremities: Extremities normal, atraumatic, no cyanosis or 1+ ble edema.    Skin: Skin warm and dry.    Neurologic: Alert and oriented x3.    Orders placed this visit:  Orders Placed This Encounter    CTA HEART CORONARY    EKG (In-Clinic Today)    lisinopriL -hydrochlorothiazide  (ZESTORETIC ) 20-25 mg Oral Tablet       Assessment/Plan:  Atypical angina (CMS HCC)    Coronary artery disease involving native coronary artery of native heart, unspecified whether angina present    Hypertension, unspecified type    Abnormal cardiovascular stress test    Dyslipidemia    The patient's stress test abnormality most likely related to breast attenuation, however patient's dyspnea is progressing. We will obtain coronary CT to further assess.  Echocardiogram reported [name] preserved EF and no valve disease.    Continue atorvastatin  20 mg daily  Continue aspirin 81 mg  daily  Continue diltiazem 60 mg twice daily  Increase Zestoretic  to 20-25 mg once daily  Bilateral LE compression stockings, place on in the AM , remove at bedtime  Coronary CT  Follow up for testing in 8 weeks      Manases Etchison, FNP-C 08/29/2023 11:12

## 2023-08-31 ENCOUNTER — Other Ambulatory Visit (INDEPENDENT_AMBULATORY_CARE_PROVIDER_SITE_OTHER): Payer: Self-pay | Admitting: NURSE PRACTITIONER

## 2023-08-31 DIAGNOSIS — Z01810 Encounter for preprocedural cardiovascular examination: Secondary | ICD-10-CM

## 2023-09-21 ENCOUNTER — Telehealth (INDEPENDENT_AMBULATORY_CARE_PROVIDER_SITE_OTHER): Payer: Self-pay | Admitting: INTERVENTIONAL CARDIOLOGY

## 2023-09-27 ENCOUNTER — Encounter (HOSPITAL_COMMUNITY): Payer: Self-pay

## 2023-09-27 ENCOUNTER — Other Ambulatory Visit: Payer: Self-pay

## 2023-09-27 ENCOUNTER — Emergency Department (HOSPITAL_COMMUNITY)

## 2023-09-27 ENCOUNTER — Emergency Department
Admission: EM | Admit: 2023-09-27 | Discharge: 2023-09-27 | Disposition: A | Discharge status: Home / Self Care | Attending: Physician Assistant | Admitting: Physician Assistant

## 2023-09-27 DIAGNOSIS — R9431 Abnormal electrocardiogram [ECG] [EKG]: Secondary | ICD-10-CM

## 2023-09-27 DIAGNOSIS — R079 Chest pain, unspecified: Secondary | ICD-10-CM

## 2023-09-27 DIAGNOSIS — J069 Acute upper respiratory infection, unspecified: Secondary | ICD-10-CM | POA: Insufficient documentation

## 2023-09-27 DIAGNOSIS — R0609 Other forms of dyspnea: Secondary | ICD-10-CM

## 2023-09-27 DIAGNOSIS — R0600 Dyspnea, unspecified: Secondary | ICD-10-CM

## 2023-09-27 DIAGNOSIS — Z1152 Encounter for screening for COVID-19: Secondary | ICD-10-CM | POA: Insufficient documentation

## 2023-09-27 DIAGNOSIS — E039 Hypothyroidism, unspecified: Secondary | ICD-10-CM | POA: Insufficient documentation

## 2023-09-27 DIAGNOSIS — Z87891 Personal history of nicotine dependence: Secondary | ICD-10-CM | POA: Insufficient documentation

## 2023-09-27 DIAGNOSIS — R600 Localized edema: Secondary | ICD-10-CM | POA: Insufficient documentation

## 2023-09-27 LAB — COMPREHENSIVE METABOLIC PANEL, NON-FASTING
ALBUMIN/GLOBULIN RATIO: 1.4 (ref 0.8–1.4)
ALBUMIN: 3.5 g/dL (ref 3.5–5.7)
ALKALINE PHOSPHATASE: 81 U/L (ref 34–104)
ALT (SGPT): 24 U/L (ref 7–52)
ANION GAP: 7 mmol/L (ref 4–13)
AST (SGOT): 23 U/L (ref 13–39)
BILIRUBIN TOTAL: 0.3 mg/dL (ref 0.3–1.0)
BUN/CREA RATIO: 20 (ref 6–22)
BUN: 18 mg/dL (ref 7–25)
CALCIUM, CORRECTED: 9.6 mg/dL (ref 8.9–10.8)
CALCIUM: 9.2 mg/dL (ref 8.6–10.3)
CHLORIDE: 105 mmol/L (ref 98–107)
CO2 TOTAL: 26 mmol/L (ref 21–31)
CREATININE: 0.9 mg/dL (ref 0.60–1.30)
ESTIMATED GFR: 79 mL/min/{1.73_m2} (ref >59)
GLOBULIN: 2.5 (ref 2.0–3.5)
GLUCOSE: 174 mg/dL — ABNORMAL HIGH (ref 74–109)
OSMOLALITY, CALCULATED: 282 mosm/kg (ref 270–290)
POTASSIUM: 3.9 mmol/L (ref 3.5–5.1)
PROTEIN TOTAL: 6 g/dL — ABNORMAL LOW (ref 6.4–8.9)
SODIUM: 138 mmol/L (ref 136–145)

## 2023-09-27 LAB — CBC WITH DIFF
BASOPHIL #: 0.1 10*3/uL (ref 0.00–0.10)
BASOPHIL %: 1 % (ref 0–1)
EOSINOPHIL #: 0.1 10*3/uL (ref 0.00–0.50)
EOSINOPHIL %: 2 % (ref 1–7)
HCT: 31.7 % (ref 31.2–41.9)
HGB: 10.3 g/dL — ABNORMAL LOW (ref 10.9–14.3)
LYMPHOCYTE #: 2.1 10*3/uL (ref 1.10–3.10)
LYMPHOCYTE %: 27 % (ref 16–46)
MCH: 25.7 pg (ref 24.7–32.8)
MCHC: 32.4 g/dL (ref 32.3–35.6)
MCV: 79.5 fL (ref 75.5–95.3)
MONOCYTE #: 0.6 10*3/uL (ref 0.20–0.90)
MONOCYTE %: 9 % (ref 4–11)
MPV: 9.3 fL (ref 7.9–10.8)
NEUTROPHIL #: 4.7 10*3/uL (ref 1.90–8.20)
NEUTROPHIL %: 62 % (ref 43–77)
PLATELETS: 252 10*3/uL (ref 140–440)
RBC: 3.99 10*6/uL (ref 3.63–4.92)
RDW: 18.2 % — ABNORMAL HIGH (ref 12.3–17.7)
WBC: 7.6 10*3/uL (ref 3.8–11.8)

## 2023-09-27 LAB — ECG 12 LEAD
Atrial Rate: 90 {beats}/min
Calculated P Axis: 80 degrees
Calculated R Axis: 59 degrees
Calculated T Axis: 51 degrees
PR Interval: 154 ms
QRS Duration: 80 ms
QT Interval: 376 ms
QTC Calculation: 459 ms
Ventricular rate: 90 {beats}/min

## 2023-09-27 LAB — PT/INR
INR: 0.93 (ref 0.84–1.10)
PROTHROMBIN TIME: 10.5 s (ref 9.8–12.7)

## 2023-09-27 LAB — TROPONIN-I
TROPONIN I: 3 ng/L (ref <15)
TROPONIN I: 3 ng/L (ref <15)
TROPONIN I: 4 ng/L (ref <15)

## 2023-09-27 LAB — THYROXINE, FREE (FREE T4): THYROXINE (T4), FREE: 0.5 ng/dL — ABNORMAL LOW (ref 0.61–1.12)

## 2023-09-27 LAB — COVID-19, FLU A/B, RSV RAPID BY PCR
INFLUENZA VIRUS TYPE A: NOT DETECTED
INFLUENZA VIRUS TYPE B: NOT DETECTED
RESPIRATORY SYNCTIAL VIRUS (RSV): NOT DETECTED
SARS-CoV-2: NOT DETECTED

## 2023-09-27 LAB — MAGNESIUM: MAGNESIUM: 1.9 mg/dL (ref 1.9–2.7)

## 2023-09-27 LAB — RAPID THROAT SCREEN, STREPTOCOCCUS, WITH REFLEX: THROAT RAPID SCREEN, STREPTOCOCCUS: NEGATIVE

## 2023-09-27 LAB — THYROID STIMULATING HORMONE WITH FREE T4 REFLEX: TSH: 17.054 u[IU]/mL — ABNORMAL HIGH (ref 0.450–5.330)

## 2023-09-27 LAB — GRAY TOP TUBE

## 2023-09-27 LAB — B-TYPE NATRIURETIC PEPTIDE (BNP),PLASMA: BNP: 15 pg/mL (ref 1–100)

## 2023-09-27 LAB — PTT (PARTIAL THROMBOPLASTIN TIME): APTT: 26.1 s (ref 25.0–38.0)

## 2023-09-27 MED ORDER — FUROSEMIDE 20 MG TABLET
20.0000 mg | ORAL_TABLET | Freq: Every day | ORAL | 0 refills | Status: DC
Start: 2023-09-27 — End: 2023-10-24

## 2023-09-27 MED ORDER — PROMETHAZINE-DM 6.25 MG-15 MG/5 ML ORAL SYRUP
5.0000 mL | ORAL_SOLUTION | Freq: Four times a day (QID) | ORAL | 0 refills | Status: DC | PRN
Start: 2023-09-27 — End: 2023-09-27

## 2023-09-27 MED ORDER — PROMETHAZINE-DM 6.25 MG-15 MG/5 ML ORAL SYRUP
5.0000 mL | ORAL_SOLUTION | Freq: Four times a day (QID) | ORAL | 0 refills | Status: DC | PRN
Start: 2023-09-27 — End: 2023-10-17

## 2023-09-27 MED ORDER — FUROSEMIDE 10 MG/ML INJECTION SOLUTION
INTRAMUSCULAR | Status: AC
Start: 2023-09-27 — End: 2023-09-27
  Filled 2023-09-27: qty 4

## 2023-09-27 MED ORDER — FUROSEMIDE 10 MG/ML INJECTION SOLUTION
40.0000 mg | INTRAMUSCULAR | Status: AC
Start: 2023-09-27 — End: 2023-09-27
  Administered 2023-09-27: 40 mg via INTRAVENOUS

## 2023-09-27 MED ORDER — FUROSEMIDE 20 MG TABLET
20.0000 mg | ORAL_TABLET | Freq: Every day | ORAL | 0 refills | Status: DC
Start: 2023-09-27 — End: 2023-09-27

## 2023-09-27 NOTE — Discharge Instructions (Signed)
 Follow up with your PCP to discuss your blood pressure medication as this is likely causing your lower extremity swelling.  Try to prop her legs up as much as possible to reduce swelling.  You will also need to discuss your thyroid  as it appears the dosage of your medication is too low.  Take Lasix  as prescribed.  Take promethazine  DM as needed for cough.  Return to the ER for other emergencies or as needed
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
## 2023-09-27 NOTE — ED Nurses Note (Signed)
 Oriented times four. Discharge instructions reviewed with patient including new scripts. Work excuse provided. Instructed to follow up with PCP within 3 days. Instructed to return to ER with any complications. Patient verbalizes understanding of all information provided. Peripheral IV discontinued with catheter intact and pressure dressing applied. Tolerated well. PIV site WNL. Patient left this facility ambulatory with steady gait and in care of self. Care relinquished at this time.

## 2023-09-27 NOTE — ED Triage Notes (Signed)
 Increase sob with edema hx CHF . States at times feels like heart us  fluttering

## 2023-09-29 LAB — THROAT CULTURE, BETA HEMOLYTIC STREPTOCOCCUS: THROAT CULTURE: NORMAL

## 2023-10-02 ENCOUNTER — Ambulatory Visit (HOSPITAL_COMMUNITY): Payer: Self-pay | Admitting: SLEEP MEDICINE

## 2023-10-03 ENCOUNTER — Other Ambulatory Visit: Payer: Self-pay

## 2023-10-03 ENCOUNTER — Ambulatory Visit
Admission: RE | Admit: 2023-10-03 | Discharge: 2023-10-03 | Disposition: A | Discharge status: Home / Self Care | Source: Ambulatory Visit | Attending: NURSE PRACTITIONER | Admitting: NURSE PRACTITIONER

## 2023-10-03 DIAGNOSIS — I2089 Other forms of angina pectoris: Secondary | ICD-10-CM | POA: Insufficient documentation

## 2023-10-03 MED ORDER — IVABRADINE 5 MG TABLET
15.0000 mg | ORAL_TABLET | Freq: Once | ORAL | Status: AC
Start: 2023-10-03 — End: 2023-10-03
  Administered 2023-10-03: 15 mg via ORAL

## 2023-10-03 MED ORDER — NITROGLYCERIN 0.4 MG SUBLINGUAL TABLET
SUBLINGUAL_TABLET | SUBLINGUAL | Status: AC
Start: 2023-10-03 — End: 2023-10-03
  Filled 2023-10-03: qty 1

## 2023-10-03 MED ORDER — METOPROLOL TARTRATE 50 MG TABLET
ORAL_TABLET | ORAL | Status: AC
Start: 2023-10-03 — End: 2023-10-03
  Filled 2023-10-03: qty 2

## 2023-10-03 MED ORDER — IVABRADINE 5 MG TABLET
ORAL_TABLET | ORAL | Status: AC
Start: 2023-10-03 — End: 2023-10-03
  Filled 2023-10-03: qty 3

## 2023-10-03 MED ORDER — METOPROLOL TARTRATE 50 MG TABLET
100.0000 mg | ORAL_TABLET | Freq: Once | ORAL | Status: AC
Start: 2023-10-03 — End: 2023-10-03
  Administered 2023-10-03: 100 mg via ORAL

## 2023-10-03 NOTE — Nurses Notes (Signed)
 Patient arrived for Coronary CTA.  Educated on exam, contrast, and medication use.  All questions answered, verbalized understanding.      Arrival VS BP 159/90 HR 79.  Will follow protocol    Telephone orders obtained from Dr Zeb for Metoprolol 100 mg and Ivabradine 15 mg PO; meds given at 0853.     After 90 minutes, HR maintained in 70's, NSR on cardiac monitor.  Dr Zeb informed.    Patient reported she had worked all night and came straight here with no medication and no knowledge the test would take this long.  Patient reports pain in her foot and states she doubts her HR will reach the desired rate.  Informed patient of options to wait for further orders by Dr Zeb or reschedule this test and talk to the ordering provider about other options.  She decided to leave and will discuss with Dr Trudi Fus office.  IV removed intact; pt ambulated out of radiology at 1045

## 2023-10-17 ENCOUNTER — Ambulatory Visit: Attending: SLEEP MEDICINE | Admitting: SLEEP MEDICINE

## 2023-10-17 ENCOUNTER — Encounter (HOSPITAL_COMMUNITY): Payer: Self-pay | Admitting: SLEEP MEDICINE

## 2023-10-17 ENCOUNTER — Other Ambulatory Visit: Payer: Self-pay

## 2023-10-17 VITALS — BP 156/84 | HR 88 | Temp 97.0°F | Resp 16 | Ht 66.0 in | Wt 263.0 lb

## 2023-10-17 DIAGNOSIS — Z91141 Patient's other noncompliance with medication regimen due to financial hardship: Secondary | ICD-10-CM

## 2023-10-17 DIAGNOSIS — G4733 Obstructive sleep apnea (adult) (pediatric): Secondary | ICD-10-CM | POA: Insufficient documentation

## 2023-10-17 DIAGNOSIS — Z7951 Long term (current) use of inhaled steroids: Secondary | ICD-10-CM

## 2023-10-17 DIAGNOSIS — J45909 Unspecified asthma, uncomplicated: Secondary | ICD-10-CM

## 2023-10-17 DIAGNOSIS — I10 Essential (primary) hypertension: Secondary | ICD-10-CM | POA: Insufficient documentation

## 2023-10-17 DIAGNOSIS — Z9989 Dependence on other enabling machines and devices: Secondary | ICD-10-CM

## 2023-10-17 DIAGNOSIS — Z87891 Personal history of nicotine dependence: Secondary | ICD-10-CM

## 2023-10-17 DIAGNOSIS — J4520 Mild intermittent asthma, uncomplicated: Secondary | ICD-10-CM | POA: Insufficient documentation

## 2023-10-17 DIAGNOSIS — F419 Anxiety disorder, unspecified: Secondary | ICD-10-CM | POA: Insufficient documentation

## 2023-10-17 DIAGNOSIS — J449 Chronic obstructive pulmonary disease, unspecified: Secondary | ICD-10-CM | POA: Insufficient documentation

## 2023-10-17 DIAGNOSIS — Z5971 Insufficient health insurance coverage: Secondary | ICD-10-CM

## 2023-10-17 MED ORDER — SPIRIVA RESPIMAT 2.5 MCG/ACTUATION SOLUTION FOR INHALATION
2.0000 | Freq: Every day | RESPIRATORY_TRACT | 2 refills | Status: DC
Start: 2023-10-17 — End: 2024-02-20

## 2023-10-17 MED ORDER — MONTELUKAST 10 MG TABLET
10.0000 mg | ORAL_TABLET | Freq: Every evening | ORAL | 5 refills | Status: AC
Start: 2023-10-17 — End: ?

## 2023-10-17 MED ORDER — IPRATROPIUM 0.5 MG-ALBUTEROL 3 MG (2.5 MG BASE)/3 ML NEBULIZATION SOLN
3.0000 mL | INHALATION_SOLUTION | Freq: Four times a day (QID) | RESPIRATORY_TRACT | 5 refills | Status: DC
Start: 2023-10-17 — End: 2024-02-20

## 2023-10-17 MED ORDER — PROMETHAZINE-DM 6.25 MG-15 MG/5 ML ORAL SYRUP
5.0000 mL | ORAL_SOLUTION | Freq: Four times a day (QID) | ORAL | 0 refills | Status: DC | PRN
Start: 2023-10-17 — End: 2023-11-15

## 2023-10-17 MED ORDER — DULERA 100 MCG-5 MCG/ACTUATION HFA AEROSOL INHALER
2.0000 | INHALATION_SPRAY | Freq: Two times a day (BID) | RESPIRATORY_TRACT | 5 refills | Status: DC
Start: 2023-10-17 — End: 2024-02-20

## 2023-10-17 MED ORDER — ALBUTEROL SULFATE HFA 90 MCG/ACTUATION AEROSOL INHALER
1.0000 | INHALATION_SPRAY | Freq: Four times a day (QID) | RESPIRATORY_TRACT | 2 refills | Status: AC | PRN
Start: 2023-10-17 — End: ?
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
## 2023-10-24 ENCOUNTER — Other Ambulatory Visit: Payer: Self-pay

## 2023-10-24 ENCOUNTER — Encounter (INDEPENDENT_AMBULATORY_CARE_PROVIDER_SITE_OTHER): Payer: Self-pay | Admitting: INTERVENTIONAL CARDIOLOGY

## 2023-10-24 ENCOUNTER — Ambulatory Visit: Attending: INTERVENTIONAL CARDIOLOGY | Admitting: INTERVENTIONAL CARDIOLOGY

## 2023-10-24 VITALS — BP 170/100 | HR 99 | Ht 66.0 in | Wt 267.0 lb

## 2023-10-24 DIAGNOSIS — I2510 Atherosclerotic heart disease of native coronary artery without angina pectoris: Secondary | ICD-10-CM | POA: Insufficient documentation

## 2023-10-24 DIAGNOSIS — I2089 Other forms of angina pectoris: Secondary | ICD-10-CM | POA: Insufficient documentation

## 2023-10-24 DIAGNOSIS — Z7982 Long term (current) use of aspirin: Secondary | ICD-10-CM

## 2023-10-24 DIAGNOSIS — E785 Hyperlipidemia, unspecified: Secondary | ICD-10-CM | POA: Insufficient documentation

## 2023-10-24 DIAGNOSIS — Z79899 Other long term (current) drug therapy: Secondary | ICD-10-CM

## 2023-10-24 DIAGNOSIS — I10 Essential (primary) hypertension: Secondary | ICD-10-CM | POA: Insufficient documentation

## 2023-10-24 MED ORDER — RANOLAZINE ER 500 MG TABLET,EXTENDED RELEASE,12 HR
500.0000 mg | ORAL_TABLET | Freq: Two times a day (BID) | ORAL | 3 refills | Status: AC
Start: 2023-10-24 — End: ?

## 2023-10-24 MED ORDER — ASPIRIN 81 MG TABLET,DELAYED RELEASE
81.0000 mg | DELAYED_RELEASE_TABLET | Freq: Every day | ORAL | Status: AC
Start: 2023-10-24 — End: ?

## 2023-10-24 MED ORDER — TELMISARTAN 40 MG TABLET
40.0000 mg | ORAL_TABLET | Freq: Every day | ORAL | 4 refills | Status: AC
Start: 2023-10-24 — End: ?

## 2023-10-24 MED ORDER — FUROSEMIDE 20 MG TABLET
20.0000 mg | ORAL_TABLET | Freq: Every day | ORAL | 3 refills | Status: AC | PRN
Start: 2023-10-24 — End: 2024-10-18

## 2023-10-24 MED ORDER — ATORVASTATIN 40 MG TABLET
40.0000 mg | ORAL_TABLET | Freq: Every evening | ORAL | 3 refills | Status: DC
Start: 2023-10-24 — End: 2023-12-06

## 2023-10-24 MED ORDER — CARVEDILOL 12.5 MG TABLET
12.5000 mg | ORAL_TABLET | Freq: Two times a day (BID) | ORAL | 3 refills | Status: AC
Start: 2023-10-24 — End: ?

## 2023-10-24 MED ORDER — EMPAGLIFLOZIN 25 MG TABLET
25.0000 mg | ORAL_TABLET | Freq: Every day | ORAL | 4 refills | Status: AC
Start: 2023-10-24 — End: ?

## 2023-10-24 MED ORDER — HYDROCHLOROTHIAZIDE 25 MG TABLET
25.0000 mg | ORAL_TABLET | Freq: Every day | ORAL | 4 refills | Status: AC
Start: 2023-10-24 — End: ?

## 2023-10-24 NOTE — Progress Notes (Signed)
 [facility]     Jennifer [name] A, [place] y.o. female  Date of Service: 10/24/2023  Date of Birth:  1975-08-27  PCP:  Lindaann Requena, CRNP  Chief Complaint   Patient presents with    Follow-up After Testing     05.06.25 CTA unable to be completed due to elevated heart rate   CAD    Hypertension    Hyperlipidemia        HPI:    The pt has mild CAD. PMH diabetes mellitus type 2 since 2003, hypothyroidism, hyperlipidemia, depression, obesity, hypertension, gastroesophageal reflux disease, OSA on CPAP, and chronic obstructive pulmonary disease.  She is a former smoker. 11/2017 LHC Mid LAD lesion 30%, LVEDP normal, normal RHC. 07/2023 MPS stress test  reported as abnormal for reversible moderate intensity anterior wall LV perfusion defect possible ischemia.  However shifting breast shadow therefore likely artifact. CT correction not available,  EF 70%.  Echocardiogram EF 64%, no wall motion abnormalities identified, diastolic dysfunction indeterminate,  no valve disease reported.     08/29/2023 The patient presents to establish care for abnormal stress test.  She complains of lower extremity edema and chronic shortness of breath with recent worsening.  She reports she is having difficulty with ADLs due to dyspnea.  She reports she was told she had HF when she lived in [place] . She has not had a cardiologist since moving to this area 3 years ago.  She also reports cardiac catheterization around 2019 that showed mild CAD.     10/24/23 Patient returns for follow up of CTA which unfortunately could not be completed due to heart rate remaining too high. She has continued to have shortness of breath. She will intermittently have dull chest pain as well. BP remains elevated at 170/100.     EKG: Sinus rhythm at 96  LAB: 07/2023 Hgb 11.3, Plt 296,  LDL 89, A1c 8.7,  Cr 0.67       Past Medical History:   Diagnosis Date    Asthma     COPD (chronic obstructive pulmonary disease)     Diabetes mellitus, type 2      HTN (hypertension)     Narcolepsy     OSA (obstructive sleep apnea)     CPAP    Thyroid  disease        Past Surgical History:   Procedure Laterality Date    HX CHOLECYSTECTOMY      HX TUBAL LIGATION      SHOULDER SURGERY Bilateral        Current Outpatient Medications   Medication Sig    acarbose (PRECOSE) 100 mg Oral Tablet Take 1 Tablet (100 mg total) by mouth Twice daily    albuterol  sulfate (PROVENTIL  OR VENTOLIN  OR PROAIR ) 90 mcg/actuation Inhalation oral inhaler Take 1-2 Puffs by inhalation Every 6 hours as needed    aspirin (ECOTRIN) 81 mg Oral Tablet, Delayed Release (E.C.) Take 1 Tablet (81 mg total) by mouth Daily    atorvastatin  (LIPITOR) 40 mg Oral Tablet Take 1 Tablet (40 mg total) by mouth Every evening    carvediloL (COREG) 12.5 mg Oral Tablet Take 1 Tablet (12.5 mg total) by mouth Twice daily with food    diphenhydrAMINE  (BENADRYL ) 25 mg Oral Capsule Take 1 Capsule (25 mg total) by mouth Every 6 hours as needed    DULoxetine (CYMBALTA DR) 30 mg Oral Capsule, Delayed Release(E.C.) Take 1 Capsule (30 mg total) by mouth Daily    empagliflozin (JARDIANCE) 25  mg Oral Tablet Take 1 Tablet (25 mg total) by mouth Daily    ergocalciferol, vitamin D2, (DRISDOL) 1,250 mcg (50,000 unit) Oral Capsule Take 1 Capsule (50,000 Units total) by mouth Every 7 days    furosemide  (LASIX ) 20 mg Oral Tablet Take 1 Tablet (20 mg total) by mouth Once per day as needed for Other (fluid retention) for up to 360 days    gabapentin  (NEURONTIN ) 800 mg Oral Tablet Take 1 Tablet (800 mg total) by mouth Three times a day    hydroCHLOROthiazide  (HYDRODIURIL ) 25 mg Oral Tablet Take 1 Tablet (25 mg total) by mouth Daily    insulin  glargine 100 unit/mL Subcutaneous injection (vial) Inject 15 Units under the skin Every evening    ipratropium-albuterol  0.5 mg-3 mg(2.5 mg base)/3 mL Solution for Nebulization Take 3 mL by nebulization Four times a day    loratadine (CLARITIN) 10 mg Oral Tablet Take 1 Tablet (10 mg total) by mouth Daily     magnesium  oxide (MAG-OX) 400 mg Oral Tablet Take 1 Tablet (400 mg total) by mouth Daily    mometasone-formoterol  (DULERA ) 100-5 mcg/dose Inhalation HFA Aerosol Inhaler Take 2 Puffs by inhalation Twice daily Rinse mouth after each use of inhaled steroid.    montelukast  (SINGULAIR ) 10 mg Oral Tablet Take 1 Tablet (10 mg total) by mouth Every evening    omeprazole (PRILOSEC) 40 mg Oral Capsule, Delayed Release(E.C.) Take 1 Capsule (40 mg total) by mouth Daily    OZEMPIC 1 mg/dose (4 mg/3 mL) Subcutaneous Pen Injector Inject 1 mg under the skin Every 7 days    promethazine -dextromethorphan (PHENERGAN -DM) 6.25-15 mg/5 mL Oral Syrup Take 5 mL by mouth Four times a day as needed for Cough    ranolazine (RANEXA) 500 mg Oral Tablet Sustained Release 12 hr Take 1 Tablet (500 mg total) by mouth Twice daily    telmisartan (MICARDIS) 40 mg Oral Tablet Take 1 Tablet (40 mg total) by mouth Daily    thyroid  (ARMOUR THYROID ) 180 mg Oral Tablet Take 1 Tablet (180 mg total) by mouth Daily    tiotropium bromide (SPIRIVA  RESPIMAT) 2.5 mcg/actuation Inhalation oral inhaler Take 2 Inhalations (2 Puffs total) by inhalation Daily    triamcinolone acetonide 0.1 % Cream Apply topically Twice daily     ROS: Other than issues noted in HPI, all other systems were negative.     Exam:  Vitals:    10/24/23 1414   BP: (!) 170/100   Pulse: 99   SpO2: 95%   Weight: 121 kg (267 lb)   Height: 1.676 m (5\' 6" )   BMI: 43.09       General: No acute distress and appears stated age.  Obese  HEENT:Head normocephalic, atraumatic. ENT without erythema or injection, mucouse membranes moist.    Neck: No JVD, no carotid bruit. and supple, symmetrical, trachea midline.   Lungs: Clear to auscultation bilaterally.    Cardiovascular: Regular rate and rhythm, normal S1 S2, no murmur, no rub, or gallop, no thrill     Abdomen: Soft, non-tender and bowel sounds normal.    Extremities: Extremities normal, atraumatic, no cyanosis or edema.    Skin: Skin warm and dry.     Neurologic: Alert and oriented x3.  Psych: Mood and affect congruent for age and gender     Orders placed this visit:  Orders Placed This Encounter    EKG (In-Clinic Today)    atorvastatin  (LIPITOR) 40 mg Oral Tablet    furosemide  (LASIX ) 20 mg Oral Tablet  aspirin (ECOTRIN) 81 mg Oral Tablet, Delayed Release (E.C.)    carvediloL (COREG) 12.5 mg Oral Tablet    telmisartan (MICARDIS) 40 mg Oral Tablet    hydroCHLOROthiazide  (HYDRODIURIL ) 25 mg Oral Tablet    empagliflozin (JARDIANCE) 25 mg Oral Tablet    ranolazine (RANEXA) 500 mg Oral Tablet Sustained Release 12 hr       Assessment/Plan:  Atypical angina (CMS HCC)    Coronary artery disease involving native coronary artery of native heart, unspecified whether angina present    Hypertension, unspecified type    Dyslipidemia    Patient continues to have DOE which could be anginal equivalent   BP not at goal, LDL not at goal, most recent A1c elevated 8.7  Will adjust medications in anticipation of possible heart cath  Discontinue cardizem  Discontinue lisinopril   Start carvedilol 12.5 mg BID  Start telmisartan 40 mg daily   Start jardiance 25 mg daily  Start ranexa 500 mg BID   Start ASA 81 mg daily  Change lasix  to 20 mg daily PRN  Increase Lipitor to 40 mg daily   Continue HCTZ 25 mg daily   Patient encouraged to keep log of BP  Follow up in 1 month to reassess medication changes and symptoms. Low threshold to schedule patient for heart cath     Jeris Montes, PA-C 10/24/2023 15:40

## 2023-10-26 LAB — ECG W INTERP (AMB USE ONLY)(MUSE,IN CLINIC)
Atrial Rate: 96 {beats}/min
Calculated P Axis: 56 degrees
Calculated R Axis: -3 degrees
Calculated T Axis: 22 degrees
PR Interval: 148 ms
QRS Duration: 80 ms
QT Interval: 366 ms
QTC Calculation: 462 ms
Ventricular rate: 96 {beats}/min

## 2023-10-31 ENCOUNTER — Ambulatory Visit (INDEPENDENT_AMBULATORY_CARE_PROVIDER_SITE_OTHER): Payer: Self-pay | Admitting: OTOLARYNGOLOGY

## 2023-11-12 ENCOUNTER — Encounter (HOSPITAL_COMMUNITY): Payer: Self-pay

## 2023-11-12 ENCOUNTER — Emergency Department (HOSPITAL_COMMUNITY)

## 2023-11-12 ENCOUNTER — Other Ambulatory Visit: Payer: Self-pay

## 2023-11-12 ENCOUNTER — Emergency Department
Admission: EM | Admit: 2023-11-12 | Discharge: 2023-11-12 | Disposition: A | Discharge status: Home / Self Care | Attending: Physician Assistant | Admitting: Physician Assistant

## 2023-11-12 DIAGNOSIS — R9431 Abnormal electrocardiogram [ECG] [EKG]: Secondary | ICD-10-CM | POA: Insufficient documentation

## 2023-11-12 DIAGNOSIS — R42 Dizziness and giddiness: Secondary | ICD-10-CM | POA: Insufficient documentation

## 2023-11-12 DIAGNOSIS — R519 Headache, unspecified: Secondary | ICD-10-CM | POA: Insufficient documentation

## 2023-11-12 DIAGNOSIS — R0602 Shortness of breath: Secondary | ICD-10-CM | POA: Insufficient documentation

## 2023-11-12 DIAGNOSIS — I509 Heart failure, unspecified: Secondary | ICD-10-CM | POA: Insufficient documentation

## 2023-11-12 DIAGNOSIS — I110 Hypertensive heart disease with heart failure: Secondary | ICD-10-CM | POA: Insufficient documentation

## 2023-11-12 DIAGNOSIS — M7989 Other specified soft tissue disorders: Secondary | ICD-10-CM | POA: Insufficient documentation

## 2023-11-12 LAB — COMPREHENSIVE METABOLIC PANEL, NON-FASTING
ALBUMIN/GLOBULIN RATIO: 1.2 (ref 0.8–1.4)
ALBUMIN: 3.8 g/dL (ref 3.5–5.7)
ALKALINE PHOSPHATASE: 92 U/L (ref 34–104)
ALT (SGPT): 27 U/L (ref 7–52)
ANION GAP: 8 mmol/L (ref 4–13)
AST (SGOT): 27 U/L (ref 13–39)
BILIRUBIN TOTAL: 0.3 mg/dL (ref 0.3–1.0)
BUN/CREA RATIO: 20 (ref 6–22)
BUN: 22 mg/dL (ref 7–25)
CALCIUM, CORRECTED: 9.5 mg/dL (ref 8.9–10.8)
CALCIUM: 9.3 mg/dL (ref 8.6–10.3)
CHLORIDE: 103 mmol/L (ref 98–107)
CO2 TOTAL: 25 mmol/L (ref 21–31)
CREATININE: 1.11 mg/dL (ref 0.60–1.30)
ESTIMATED GFR: 61 mL/min/{1.73_m2} (ref >59)
GLOBULIN: 3.1 (ref 2.0–3.5)
GLUCOSE: 216 mg/dL — ABNORMAL HIGH (ref 74–109)
OSMOLALITY, CALCULATED: 282 mosm/kg (ref 270–290)
POTASSIUM: 3.2 mmol/L — ABNORMAL LOW (ref 3.5–5.1)
PROTEIN TOTAL: 6.9 g/dL (ref 6.4–8.9)
SODIUM: 136 mmol/L (ref 136–145)

## 2023-11-12 LAB — CBC WITH DIFF
BASOPHIL #: 0 10*3/uL (ref 0.00–0.10)
BASOPHIL %: 0 % (ref 0–1)
EOSINOPHIL #: 0.1 10*3/uL (ref 0.00–0.50)
EOSINOPHIL %: 1 % (ref 1–7)
HCT: 33.1 % (ref 31.2–41.9)
HGB: 11.1 g/dL (ref 10.9–14.3)
LYMPHOCYTE #: 1.6 10*3/uL (ref 1.10–3.10)
LYMPHOCYTE %: 23 % (ref 16–46)
MCH: 26.8 pg (ref 24.7–32.8)
MCHC: 33.5 g/dL (ref 32.3–35.6)
MCV: 79.8 fL (ref 75.5–95.3)
MONOCYTE #: 0.5 10*3/uL (ref 0.20–0.90)
MONOCYTE %: 7 % (ref 4–11)
MPV: 9.4 fL (ref 7.9–10.8)
NEUTROPHIL #: 4.8 10*3/uL (ref 1.90–8.20)
NEUTROPHIL %: 68 % (ref 43–77)
PLATELETS: 187 10*3/uL (ref 140–440)
RBC: 4.15 10*6/uL (ref 3.63–4.92)
RDW: 17.1 % (ref 12.3–17.7)
WBC: 7 10*3/uL (ref 3.8–11.8)

## 2023-11-12 LAB — PT/INR
INR: 0.96 (ref 0.84–1.10)
PROTHROMBIN TIME: 10.9 s (ref 9.8–12.7)

## 2023-11-12 LAB — COVID-19, FLU A/B, RSV RAPID BY PCR
INFLUENZA VIRUS TYPE A: NOT DETECTED
INFLUENZA VIRUS TYPE B: NOT DETECTED
RESPIRATORY SYNCTIAL VIRUS (RSV): NOT DETECTED
SARS-CoV-2: NOT DETECTED

## 2023-11-12 LAB — B-TYPE NATRIURETIC PEPTIDE (BNP),PLASMA: BNP: 9 pg/mL (ref 1–100)

## 2023-11-12 LAB — GRAY TOP TUBE

## 2023-11-12 LAB — PTT (PARTIAL THROMBOPLASTIN TIME): APTT: 27.1 s (ref 25.0–38.0)

## 2023-11-12 LAB — GOLD TOP TUBE

## 2023-11-12 LAB — TROPONIN-I
TROPONIN I: 3 ng/L (ref <15)
TROPONIN I: 3 ng/L (ref <15)

## 2023-11-12 NOTE — ED APP Handoff Note (Signed)
 [facility] - Emergency Department  Emergency Department  Provider in Triage Note    Name: Jennifer [name]  Age: 47 y.o.  Gender: female     Subjective:   Jennifer [name] is a 48 y.o. female who presents with complaint of Hypertension and Lightheaded  The patient presents to the ER today with reports of high blood pressure, dizziness, and shortness of breath. She reports this has been ongoing for approximately 3 hours. She reports bilateral ankle swelling. She reports history of CHF and weight gain of 7 pounds over the last few days.     Objective:   Filed Vitals:    11/12/23 1420   BP: (!) 143/81   Pulse: (!) 106   Resp: 18   Temp: 36.5 C (97.7 F)   SpO2: 96%      Vitals are also documented in the EMR.  Focused Physical Exam shows adult, well developed adult female in no acute distress.     Assessment:  A medical screening exam was completed.  This patient is a 48 y.o. female with Hypertension and Lightheaded  .    Plan:  Please see initial orders and work-up in the EMR.  This is to be continued with full evaluation in the main Emergency Department.     No current facility-administered medications for this encounter.     Results for orders placed or performed during the hospital encounter of 11/12/23 (from the past 24 hours)   CBC/DIFF    Collection Time: 11/12/23  2:23 PM    Narrative    The following orders were created for panel order CBC/DIFF.  Procedure                               Abnormality         Status                     ---------                               -----------         ------                     CBC WITH DIFF[[id number]]                                                                 Please view results for these tests on the individual orders.

## 2023-11-12 NOTE — ED Provider Notes (Signed)
 [facility] - Emergency Department  ED Primary Note  History of Present Illness   Jennifer [name] is a 48 y.o. female who had concerns including Hypertension and Lightheaded.     Patient is a 48 year old female presents emergency department with complaints of headache and bilateral lower extremity swelling.  She reports past medical history of congestive heart failure.  States she recently just returned from a plane ride.  She denies shortness of breath or chest pain    Physical Exam   ED Triage Vitals [11/12/23 1420]   BP (Non-Invasive) (!) 143/81   Heart Rate (!) 106   Respiratory Rate 18   Temperature 36.5 C (97.7 F)   SpO2 96 %   Weight 121 kg (267 lb)   Height 1.676 m (5' 6)     Physical Exam  Constitutional:       Appearance: Normal appearance.   HENT:      Head: Normocephalic.      Nose: Nose normal.      Mouth/Throat:      Mouth: Mucous membranes are moist.   Eyes:      Pupils: Pupils are equal, round, and reactive to light.   Cardiovascular:      Rate and Rhythm: Normal rate and regular rhythm.      Pulses: Normal pulses.      Heart sounds: Normal heart sounds.   Pulmonary:      Effort: Pulmonary effort is normal.      Breath sounds: Normal breath sounds.   Abdominal:      General: Abdomen is flat.      Palpations: Abdomen is soft.   Musculoskeletal:      Cervical back: Normal range of motion.   Neurological:      Mental Status: She is alert.       Patient Data   Labs Ordered/Reviewed   COMPREHENSIVE METABOLIC PANEL, NON-FASTING - Abnormal; Notable for the following components:       Result Value    POTASSIUM 3.2 (*)     GLUCOSE 216 (*)     All other components within normal limits    Narrative:     Estimated Glomerular Filtration Rate (eGFR) is calculated using the CKD-EPI (2021) equation, intended for patients 44 years of age and older. If gender is not documented or unknown, there will be no eGFR calculation.     B-TYPE NATRIURETIC PEPTIDE (BNP),PLASMA - Normal    Narrative:                                  Class 1: 101-250 pg/mL                              Class 2: 251-550 pg/mL                              Class 3: 551-900 pg/mL                              Class 4: >901 pg/mL     The [place]  [employer] has developed a four-stage functional classification system for CHF that is based on a subjective interpretation of the severity of a patient's clinical signs and symptoms.    Class 1 - Patients  have no limitations on physical activity and have no symptoms with ordinary physical activity.    Class 2 - Patients have a slight limitation of physical activity and have symptoms with ordinary physical activity.    Class 3 - Patients have a marked limitation of physical activity and have symptoms with less than ordinary physical activity, but not at rest.    Class 4 - Patients are unable to perform any physical activity without discomfort.   PT/INR - Normal    Narrative:     In the setting of warfarin therapy, a moderate-intensity INR goal range is 2.0 to 3.0 and a high-intensity INR goal range is 2.5 to 3.5.    INR is ONLY validated to determine the level of anticoagulation with vitamin K antagonists (warfarin). Other factors may elevate the INR including but not limited to direct oral anticoagulants (DOACs), liver dysfunction, vitamin K deficiency, DIC, factor deficiencies, and factor inhibitors.   PTT (PARTIAL THROMBOPLASTIN TIME) - Normal   TROPONIN-I - Normal   TROPONIN-I - Normal   CBC WITH DIFF - Normal   COVID-19, FLU A/B, RSV RAPID BY PCR - Normal    Narrative:     Results are for the simultaneous qualitative identification of SARS-CoV-2 (formerly 2019-nCoV), Influenza A, Influenza B, and RSV RNA. These etiologic agents are generally detectable in nasopharyngeal and nasal swabs during the ACUTE PHASE of infection. Hence, this test is intended to be performed on respiratory specimens collected from individuals with signs and symptoms of upper respiratory tract infection who meet  Centers for Disease Control and Prevention (CDC) clinical and/or epidemiological criteria for Coronavirus Disease 2019 (COVID-19) testing. CDC COVID-19 criteria for testing on human specimens is available at [facility] webpage information for Healthcare Professionals: Coronavirus Disease 2019 (COVID-19) ([email or web address]).     False-negative results may occur if the virus has genomic mutations, insertions, deletions, or rearrangements or if performed very early in the course of illness. Otherwise, negative results indicate virus specific RNA targets are not detected, however negative results do not preclude SARS-CoV-2 infection/COVID-19, Influenza, or Respiratory syncytial virus infection. Results should not be used as the sole basis for patient management decisions. Negative results must be combined with clinical observations, patient history, and epidemiological information. If upper respiratory tract infection is still suspected based on exposure history together with other clinical findings, re-testing should be considered.    Test methodology:   Cepheid Xpert Xpress SARS-CoV-2/Flu/RSV Assay real-time polymerase chain reaction (RT-PCR) test on the GeneXpert Dx and Xpert Xpress systems.   CBC/DIFF    Narrative:     The following orders were created for panel order CBC/DIFF.  Procedure                               Abnormality         Status                     ---------                               -----------         ------                     CBC WITH DIFF[[id number]]                Normal  Final result                 Please view results for these tests on the individual orders.   TROPONIN-I   EXTRA TUBES    Narrative:     The following orders were created for panel order EXTRA TUBES.  Procedure                               Abnormality         Status                     ---------                               -----------         ------                     GOLD  TOP ZOXW[[id number]]                                    In process                 GRAY TOP TUBE[[id number]]                                    In process                   Please view results for these tests on the individual orders.   GOLD TOP TUBE   GRAY TOP TUBE     XR AP MOBILE CHEST   Final Result by Edi, Radresults In (06/15 1510)   NEGATIVE CHEST            Radiologist location ID: [facility]           Medical Decision Making        Medical Decision Making  Blood work unremarkable chest x-ray clear BNP within normal limits venous duplex was negative for deep vein thrombosis.  COVID flu RSV negative.  Discussed symptomatic management primary care follow-up and return precautions                   Clinical Impression   Bilateral swelling of feet   Headache (Primary)       Disposition: Discharged

## 2023-11-12 NOTE — Discharge Instructions (Signed)
 Blood work and imaging looks good.  Recommend elevation of the extremities pushing fluids follow up with the primary care.  Continue all medications as prescribed.  If new or worsening symptoms return to the emergency department

## 2023-11-12 NOTE — ED Triage Notes (Addendum)
 ANKLES SWOLLEN WHEN She GOT OFF OF PLANE TODAY. STATES BP IS HIGH. GENERALIZED ACHING AND HA.Aaron AasAaron AasAaron Aas

## 2023-11-13 ENCOUNTER — Other Ambulatory Visit: Payer: Self-pay

## 2023-11-13 ENCOUNTER — Telehealth (HOSPITAL_COMMUNITY): Payer: Self-pay | Admitting: SLEEP MEDICINE

## 2023-11-13 DIAGNOSIS — R9431 Abnormal electrocardiogram [ECG] [EKG]: Secondary | ICD-10-CM

## 2023-11-13 LAB — ECG 12 LEAD
Atrial Rate: 97 {beats}/min
Calculated P Axis: 72 degrees
Calculated R Axis: 26 degrees
Calculated T Axis: 39 degrees
PR Interval: 158 ms
QRS Duration: 84 ms
QT Interval: 368 ms
QTC Calculation: 467 ms
Ventricular rate: 97 {beats}/min

## 2023-11-13 NOTE — Telephone Encounter (Signed)
 Pt stopped by today and was upset. She states she is struggling to work with her breathing. States all the chemicals  cause her to end up in ER with exacerbation of symptoms. 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 Says her regular doc says they dont handle her breathing issues, so she thought we may be the place to get assistance.

## 2023-11-15 ENCOUNTER — Other Ambulatory Visit: Payer: Self-pay

## 2023-11-15 ENCOUNTER — Encounter (HOSPITAL_COMMUNITY): Payer: Self-pay

## 2023-11-15 ENCOUNTER — Emergency Department
Admission: EM | Admit: 2023-11-15 | Discharge: 2023-11-15 | Disposition: A | Discharge status: Home / Self Care | Attending: NURSE PRACTITIONER | Admitting: NURSE PRACTITIONER

## 2023-11-15 ENCOUNTER — Ambulatory Visit (HOSPITAL_COMMUNITY)

## 2023-11-15 DIAGNOSIS — J40 Bronchitis, not specified as acute or chronic: Secondary | ICD-10-CM | POA: Insufficient documentation

## 2023-11-15 DIAGNOSIS — R0602 Shortness of breath: Secondary | ICD-10-CM

## 2023-11-15 DIAGNOSIS — Z7984 Long term (current) use of oral hypoglycemic drugs: Secondary | ICD-10-CM

## 2023-11-15 DIAGNOSIS — Z7985 Long-term (current) use of injectable non-insulin antidiabetic drugs: Secondary | ICD-10-CM

## 2023-11-15 DIAGNOSIS — E119 Type 2 diabetes mellitus without complications: Secondary | ICD-10-CM | POA: Insufficient documentation

## 2023-11-15 DIAGNOSIS — Z87891 Personal history of nicotine dependence: Secondary | ICD-10-CM | POA: Insufficient documentation

## 2023-11-15 DIAGNOSIS — Z794 Long term (current) use of insulin: Secondary | ICD-10-CM

## 2023-11-15 LAB — COVID-19, FLU A/B, RSV RAPID BY PCR
INFLUENZA VIRUS TYPE A: NOT DETECTED
INFLUENZA VIRUS TYPE B: NOT DETECTED
RESPIRATORY SYNCTIAL VIRUS (RSV): NOT DETECTED
SARS-CoV-2: NOT DETECTED

## 2023-11-15 LAB — RAPID THROAT SCREEN, STREPTOCOCCUS, WITH REFLEX: THROAT RAPID SCREEN, STREPTOCOCCUS: NEGATIVE

## 2023-11-15 MED ORDER — BENZONATATE 100 MG CAPSULE
100.0000 mg | ORAL_CAPSULE | Freq: Three times a day (TID) | ORAL | 0 refills | Status: DC | PRN
Start: 2023-11-15 — End: 2023-11-23

## 2023-11-15 MED ORDER — BENZOCAINE-MENTHOL SORE THROAT LOZENGE WRAPPER
1.0000 | LOZENGE | Status: DC | PRN
Start: 2023-11-15 — End: 2023-11-15
  Filled 2023-11-15: qty 1

## 2023-11-15 MED ORDER — AZITHROMYCIN 250 MG TABLET
ORAL_TABLET | ORAL | Status: AC
Start: 2023-11-15 — End: 2023-11-15
  Filled 2023-11-15: qty 2

## 2023-11-15 MED ORDER — DEXAMETHASONE SODIUM PHOSPHATE (PF) 10 MG/ML INJECTION SOLUTION
INTRAMUSCULAR | Status: AC
Start: 2023-11-15 — End: 2023-11-15
  Filled 2023-11-15: qty 1

## 2023-11-15 MED ORDER — IBUPROFEN 600 MG TABLET
600.0000 mg | ORAL_TABLET | Freq: Four times a day (QID) | ORAL | 0 refills | Status: AC | PRN
Start: 2023-11-15 — End: ?

## 2023-11-15 MED ORDER — AZITHROMYCIN 250 MG TABLET
500.0000 mg | ORAL_TABLET | ORAL | Status: AC
Start: 2023-11-15 — End: 2023-11-15
  Administered 2023-11-15: 500 mg via ORAL

## 2023-11-15 MED ORDER — AZITHROMYCIN 250 MG TABLET
250.0000 mg | ORAL_TABLET | Freq: Every day | ORAL | 0 refills | Status: DC
Start: 2023-11-15 — End: 2023-11-23

## 2023-11-15 MED ORDER — KETOROLAC 30 MG/ML (1 ML) INJECTION SOLUTION
INTRAMUSCULAR | Status: AC
Start: 2023-11-15 — End: 2023-11-15
  Filled 2023-11-15: qty 1

## 2023-11-15 MED ORDER — IPRATROPIUM 0.5 MG-ALBUTEROL 3 MG (2.5 MG BASE)/3 ML NEBULIZATION SOLN
3.0000 mL | INHALATION_SOLUTION | RESPIRATORY_TRACT | Status: AC
Start: 2023-11-15 — End: 2023-11-15
  Administered 2023-11-15: 3 mL via RESPIRATORY_TRACT

## 2023-11-15 MED ORDER — DEXAMETHASONE SODIUM PHOSPHATE (PF) 10 MG/ML INJECTION SOLUTION
10.0000 mg | INTRAMUSCULAR | Status: AC
Start: 2023-11-15 — End: 2023-11-15
  Administered 2023-11-15: 10 mg via INTRAMUSCULAR

## 2023-11-15 MED ORDER — KETOROLAC 30 MG/ML (1 ML) INJECTION SOLUTION
30.0000 mg | INTRAMUSCULAR | Status: AC
Start: 2023-11-15 — End: 2023-11-15
  Administered 2023-11-15: 30 mg via INTRAMUSCULAR

## 2023-11-15 MED ORDER — METHYLPREDNISOLONE 4 MG TABLETS IN A DOSE PACK
ORAL_TABLET | ORAL | 0 refills | Status: DC
Start: 2023-11-15 — End: 2023-11-23

## 2023-11-15 MED ORDER — FLUTICASONE PROPIONATE 50 MCG/ACTUATION NASAL SPRAY,SUSPENSION
1.0000 | Freq: Every day | NASAL | 0 refills | Status: DC
Start: 2023-11-15 — End: 2023-11-23

## 2023-11-15 NOTE — ED APP Handoff Note (Signed)
 [facility] - Emergency Department    Jennifer [name] is a 48 y.o. female presenting to the ED today for:  Sore Throat and Flu Like Symptoms.  Patient reports that she was seen in the ER the other day with reports of congestion, headache and told to return to the ER if she did not feel better in a few days.  Patient reports that she has been having increased sweating, sore throat, cough, congestion.  Patient denies taking tylenol or Motrin for relief.    Additional History:   Past Medical History:   Diagnosis Date    Asthma     COPD (chronic obstructive pulmonary disease)     Diabetes mellitus, type 2     HTN (hypertension)     Narcolepsy     OSA (obstructive sleep apnea)     CPAP    Thyroid  disease      Past Surgical History:   Procedure Laterality Date    HX CHOLECYSTECTOMY      HX TUBAL LIGATION      SHOULDER SURGERY Bilateral            Pertinent Exam findings:  Filed Vitals:    11/15/23 1354   BP: (!) 157/91   Pulse: 90   Resp: 18   Temp: 36.3 C (97.3 F)   SpO2: 99%       Gen: Nontoxic in appearance.  Head: NC/AT  Neck: Trachea midline. No gross meningismus.  Psych: Denis SI/HI.    Assessment: Medical Screening Exam.    Plan/MDM at Triage:  I have considered labs, imaging, EKG.  These have been ordered as clinically indicated.  I have considered that the patient may need medications.  However, due to treatment space limitation, medications considered have to be appropriate for the waiting room.    I have considered the patient's chronic conditions and have counseled the patient that we will be evaluating for the presence of an emergency medical condition today in which their chronic conditions may or may not play a role.  I have advised that the patient should notify their PCP of today's visit regardless of disposition.  I have considered the patient's social determinants of health and health care literacy while determining the patient's initial plan of care.  I have further advised that  the patient will possibly see a 2nd emergency provider today during their stay to help promote throughput.    I have performed an initial medical screening evaluation in order to determine if the patient has an emergency medical condition. The patient has been evaluated.  Imminent life, limb, or sight threatening emergency will be taken to the treatment area immediately. The patient's orders will be reviewed by nursing staff and placed in the treatment area as soon as possible as bed capacity allows.  Should the patient's status change, or a new sign/symptom develop, immediate notification of nursing or ED staff has been advised.

## 2023-11-15 NOTE — ED Triage Notes (Signed)
 SORE THROAT, COUGH/CONGESTION, SWEATS SINCE YESTERDAY.

## 2023-11-15 NOTE — ED Provider Notes (Signed)
 [facility] - Emergency Department  ED Primary Note  History of Present Illness   Jennifer [name] is a 48 y.o. female who had concerns including Sore Throat and Flu Like Symptoms.     48 year old female who concerns wanting since yesterday.  Intermittent laryngitis.  Patient denies any fevers or chills chest pain does not complain of mild chest tightness with cough and breathing.    She appears to be in no respiratory or cardiovascular distress at the bedside afebrile    ROS negative other than what is stated in the HPI      Past Medical History:   Past Medical History:   Diagnosis Date    Asthma     COPD (chronic obstructive pulmonary disease)     Diabetes mellitus, type 2     HTN (hypertension)     Narcolepsy     OSA (obstructive sleep apnea)     CPAP    Thyroid  disease        Past Surgical History:   Past Surgical History:   Procedure Laterality Date    Hx cholecystectomy      Hx tubal ligation      Shoulder surgery Bilateral        Social History: Social History[1]     Family History:  Family History   Problem Relation Age of Onset    No Known Problems Mother     No Known Problems Father     No Known Problems Sister     No Known Problems Brother      Oncology History    No history exists.        Physical Exam   ED Triage Vitals [11/15/23 1354]   BP (Non-Invasive) (!) 157/91   Heart Rate 90   Respiratory Rate 18   Temperature 36.3 C (97.3 F)   SpO2 99 %   Weight 121 kg (267 lb)   Height 1.676 m (5' 6)     Physical Exam  Vitals and nursing note reviewed.   Constitutional:       General: She is not in acute distress.     Appearance: Normal appearance. She is normal weight. She is not ill-appearing, toxic-appearing or diaphoretic.   HENT:      Head: Normocephalic.      Nose: Congestion present.      Mouth/Throat:      Mouth: Mucous membranes are moist.      Pharynx: Oropharynx is clear. No oropharyngeal exudate.   Eyes:      Conjunctiva/sclera: Conjunctivae normal.      Pupils: Pupils are  equal, round, and reactive to light.   Cardiovascular:      Rate and Rhythm: Normal rate and regular rhythm.      Pulses: Normal pulses.      Heart sounds: Normal heart sounds.   Pulmonary:      Effort: Pulmonary effort is normal.      Breath sounds: Examination of the right-lower field reveals decreased breath sounds. Examination of the left-lower field reveals decreased breath sounds. Decreased breath sounds present.   Abdominal:      General: Abdomen is flat. Bowel sounds are normal.   Musculoskeletal:         General: Normal range of motion.      Cervical back: Normal range of motion.   Skin:     General: Skin is warm.      Capillary Refill: Capillary refill takes less than 2 seconds.  Neurological:      General: No focal deficit present.      Mental Status: She is alert and oriented to person, place, and time. Mental status is at baseline.   Psychiatric:         Mood and Affect: Mood normal.         Behavior: Behavior normal.         Thought Content: Thought content normal.         Medications Prior to Admission       Prescriptions    acarbose (PRECOSE) 100 mg Oral Tablet    Take 1 Tablet (100 mg total) by mouth Twice daily    albuterol  sulfate (PROVENTIL  OR VENTOLIN  OR PROAIR ) 90 mcg/actuation Inhalation oral inhaler    Take 1-2 Puffs by inhalation Every 6 hours as needed    aspirin  (ECOTRIN) 81 mg Oral Tablet, Delayed Release (E.C.)    Take 1 Tablet (81 mg total) by mouth Daily    atorvastatin  (LIPITOR) 40 mg Oral Tablet    Take 1 Tablet (40 mg total) by mouth Every evening    carvediloL  (COREG ) 12.5 mg Oral Tablet    Take 1 Tablet (12.5 mg total) by mouth Twice daily with food    diphenhydrAMINE  (BENADRYL ) 25 mg Oral Capsule    Take 1 Capsule (25 mg total) by mouth Every 6 hours as needed    DULoxetine (CYMBALTA DR) 30 mg Oral Capsule, Delayed Release(E.C.)    Take 1 Capsule (30 mg total) by mouth Daily    empagliflozin  (JARDIANCE ) 25 mg Oral Tablet    Take 1 Tablet (25 mg total) by mouth Daily     ergocalciferol, vitamin D2, (DRISDOL) 1,250 mcg (50,000 unit) Oral Capsule    Take 1 Capsule (50,000 Units total) by mouth Every 7 days    furosemide  (LASIX ) 20 mg Oral Tablet    Take 1 Tablet (20 mg total) by mouth Once per day as needed for Other (fluid retention) for up to 360 days    gabapentin  (NEURONTIN ) 800 mg Oral Tablet    Take 1 Tablet (800 mg total) by mouth Three times a day    hydroCHLOROthiazide  (HYDRODIURIL ) 25 mg Oral Tablet    Take 1 Tablet (25 mg total) by mouth Daily    insulin  glargine 100 unit/mL Subcutaneous injection (vial)    Inject 15 Units under the skin Every evening    ipratropium-albuterol  0.5 mg-3 mg(2.5 mg base)/3 mL Solution for Nebulization    Take 3 mL by nebulization Four times a day    loratadine (CLARITIN) 10 mg Oral Tablet    Take 1 Tablet (10 mg total) by mouth Daily    magnesium  oxide (MAG-OX) 400 mg Oral Tablet    Take 1 Tablet (400 mg total) by mouth Daily    mometasone-formoterol  (DULERA ) 100-5 mcg/dose Inhalation HFA Aerosol Inhaler    Take 2 Puffs by inhalation Twice daily Rinse mouth after each use of inhaled steroid.    montelukast  (SINGULAIR ) 10 mg Oral Tablet    Take 1 Tablet (10 mg total) by mouth Every evening    omeprazole (PRILOSEC) 40 mg Oral Capsule, Delayed Release(E.C.)    Take 1 Capsule (40 mg total) by mouth Daily    OZEMPIC 1 mg/dose (4 mg/3 mL) Subcutaneous Pen Injector    Inject 1 mg under the skin Every 7 days    promethazine -dextromethorphan (PHENERGAN -DM) 6.25-15 mg/5 mL Oral Syrup    Take 5 mL by mouth Four times a day as needed  for Cough    ranolazine  (RANEXA ) 500 mg Oral Tablet Sustained Release 12 hr    Take 1 Tablet (500 mg total) by mouth Twice daily    telmisartan  (MICARDIS ) 40 mg Oral Tablet    Take 1 Tablet (40 mg total) by mouth Daily    thyroid  (ARMOUR THYROID ) 180 mg Oral Tablet    Take 1 Tablet (180 mg total) by mouth Daily    tiotropium bromide (SPIRIVA  RESPIMAT) 2.5 mcg/actuation Inhalation oral inhaler    Take 2 Inhalations (2 Puffs  total) by inhalation Daily    triamcinolone acetonide 0.1 % Cream    Apply topically Twice daily            Allergies: Allergies[2]    Above history reviewed with patient.  Allergies, medication list, reviewed.     Patient Data   Labs Ordered/Reviewed   RAPID THROAT SCREEN, STREPTOCOCCUS, WITH REFLEX - Normal    Narrative:     Walk-Away Mode   COVID-19, FLU A/B, RSV RAPID BY PCR - Normal    Narrative:     Results are for the simultaneous qualitative identification of SARS-CoV-2 (formerly 2019-nCoV), Influenza A, Influenza B, and RSV RNA. These etiologic agents are generally detectable in nasopharyngeal and nasal swabs during the ACUTE PHASE of infection. Hence, this test is intended to be performed on respiratory specimens collected from individuals with signs and symptoms of upper respiratory tract infection who meet Centers for Disease Control and Prevention (CDC) clinical and/or epidemiological criteria for Coronavirus Disease 2019 (COVID-19) testing. CDC COVID-19 criteria for testing on human specimens is available at [facility] webpage information for Healthcare Professionals: Coronavirus Disease 2019 (COVID-19) ([email or web address]).     False-negative results may occur if the virus has genomic mutations, insertions, deletions, or rearrangements or if performed very early in the course of illness. Otherwise, negative results indicate virus specific RNA targets are not detected, however negative results do not preclude SARS-CoV-2 infection/COVID-19, Influenza, or Respiratory syncytial virus infection. Results should not be used as the sole basis for patient management decisions. Negative results must be combined with clinical observations, patient history, and epidemiological information. If upper respiratory tract infection is still suspected based on exposure history together with other clinical findings, re-testing should be considered.    Test methodology:   Cepheid Xpert  Xpress SARS-CoV-2/Flu/RSV Assay real-time polymerase chain reaction (RT-PCR) test on the GeneXpert Dx and Xpert Xpress systems.   THROAT CULTURE, BETA HEMOLYTIC STREPTOCOCCUS     XR CHEST PA AND LATERAL   Final Result by Edi, Radresults In (06/18 1419)   NO ACUTE FINDINGS.         Radiologist location ID: [id number]           Medical Decision Making        Medical Decision Making  Nursing notes reviewed.    Medical chart and diagnostic results reviewed.    Attending available for questioning consult.    Emergency department course:   A 48 year old female who comes into the emergency room with a complaint of cough cold congestion like symptoms physical exam with the bedside very consistent with a viral respiratory illness possibly bronchitis.  Patient was treated with Toradol , Decadron , breathing treatment she tolerated this treatment well.  She does have some comorbidities such as asthma and diabetes she will be sent home on steroid Dosepak Z-Pak, she does have an inhaler at home she was instructed to take that 2 puffs every 4-6 hours as need for cough and shortness of breath, she  was sent home with Tessalon  Perles she was provided a work excuse.  All questions and concerns were addressed at the bedside.  I have low suspicion for any acute pathology that needs to be addressed such as pulmonary embolism or respiratory distress.  Patient tolerated treatment well this is likely a viral syndrome or bronchitis.  COVID flu RSV are unremarkable.  Patient is safe and stable for discharge home with strict return instructions      I have discussed and counseled the patient and available family on the patient's condition, test results, treatment, and likely expected clinical course.  Patient was counseled on supportive care at home.  All questions and concerns were addressed at the bedside.  Patient verbalized understanding and is agreeable with the plan.  Patient is always welcome to return to the ER should new or worsening  symptoms occur.  At this time patient is safe and stable for discharge home with strict return instructions.    Amount and/or Complexity of Data Reviewed  ECG/medicine tests: independent interpretation performed.    Risk  Prescription drug management.      ED Course as of 11/16/23 1218   Thu Nov 16, 2023   1212 COVID-19, FLU A/B, RSV RAPID BY PCR   1212 SARS CORONAVIRUS 2 (SARS-CoV-2): Not Detected   1212 INFLUENZA VIRUS TYPE A: Not Detected   1212 INFLUENZA VIRUS TYPE B: Not Detected   1212 RSV PCR: Not Detected   1212 RAPID THROAT SCREEN, STREPTOCOCCUS, WITH REFLEX   1212 THROAT RAPID SCREEN, STREPTOCOCCUS: Negative            Medications Ordered/Administered in the ED   dexAMETHasone  (PF) 10 mg/mL injection (10 mg IntraMUSCULAR Given 11/15/23 1621)   ketorolac  (TORADOL ) 30 mg/mL injection (30 mg IntraMUSCULAR Given 11/15/23 1622)   azithromycin  (ZITHROMAX ) tablet (500 mg Oral Given 11/15/23 1621)   ipratropium-albuterol  0.5 mg-3 mg(2.5 mg base)/3 mL Solution for Nebulization (3 mL Nebulization Given 11/15/23 1627)     Clinical Impression   Bronchitis (Primary)       Disposition: Discharged                  [1]   Social History  Tobacco Use    Smoking status: Former     Current packs/day: 0.00     Average packs/day: 0.5 packs/day for 29.0 years (14.5 ttl pk-yrs)     Types: Cigarettes     Start date: 21     Quit date: 2020     Years since quitting: 5.4     Passive exposure: Past    Smokeless tobacco: Former   Haematologist status: Never Used   Substance Use Topics    Alcohol use: Not Currently     Comment: couple glasses of wine on the weekend    Drug use: Not Currently     Comment: has not used marijuana in past several months   [2]   Allergies  Allergen Reactions    Aspirin       Unknown Reaction during childhood    Tomato     Tylenol  [Acetaminophen ]  Other Adverse Reaction (Add comment)     Fatty liver

## 2023-11-17 LAB — THROAT CULTURE, BETA HEMOLYTIC STREPTOCOCCUS: THROAT CULTURE: NORMAL

## 2023-11-20 ENCOUNTER — Emergency Department
Admission: EM | Admit: 2023-11-20 | Discharge: 2023-11-21 | Disposition: A | Discharge status: Home / Self Care | Attending: NURSE PRACTITIONER | Admitting: NURSE PRACTITIONER

## 2023-11-20 ENCOUNTER — Other Ambulatory Visit: Payer: Self-pay

## 2023-11-20 ENCOUNTER — Encounter (HOSPITAL_COMMUNITY): Payer: Self-pay

## 2023-11-20 DIAGNOSIS — R519 Headache, unspecified: Secondary | ICD-10-CM | POA: Insufficient documentation

## 2023-11-20 DIAGNOSIS — T678XXA Other effects of heat and light, initial encounter: Secondary | ICD-10-CM

## 2023-11-20 DIAGNOSIS — R110 Nausea: Secondary | ICD-10-CM

## 2023-11-20 DIAGNOSIS — T679XXA Effect of heat and light, unspecified, initial encounter: Secondary | ICD-10-CM | POA: Insufficient documentation

## 2023-11-20 NOTE — ED Triage Notes (Signed)
 To ED via PRS. Called for HA and states she was overheating. States she drank 1 class of wine tonight.     BS 218

## 2023-11-21 LAB — COMPREHENSIVE METABOLIC PANEL, NON-FASTING
ALBUMIN/GLOBULIN RATIO: 1.2 (ref 0.8–1.4)
ALBUMIN: 3.9 g/dL (ref 3.5–5.7)
ALKALINE PHOSPHATASE: 91 U/L (ref 34–104)
ALT (SGPT): 32 U/L (ref 7–52)
ANION GAP: 11 mmol/L (ref 4–13)
AST (SGOT): 19 U/L (ref 13–39)
BILIRUBIN TOTAL: 0.3 mg/dL (ref 0.3–1.0)
BUN/CREA RATIO: 16 (ref 6–22)
BUN: 16 mg/dL (ref 7–25)
CALCIUM, CORRECTED: 9.5 mg/dL (ref 8.9–10.8)
CALCIUM: 9.4 mg/dL (ref 8.6–10.3)
CHLORIDE: 103 mmol/L (ref 98–107)
CO2 TOTAL: 24 mmol/L (ref 21–31)
CREATININE: 0.98 mg/dL (ref 0.60–1.30)
ESTIMATED GFR: 71 mL/min/{1.73_m2} (ref >59)
GLOBULIN: 3.2 (ref 2.0–3.5)
GLUCOSE: 231 mg/dL — ABNORMAL HIGH (ref 74–109)
OSMOLALITY, CALCULATED: 284 mosm/kg (ref 270–290)
POTASSIUM: 3.5 mmol/L (ref 3.5–5.1)
PROTEIN TOTAL: 7.1 g/dL (ref 6.4–8.9)
SODIUM: 138 mmol/L (ref 136–145)

## 2023-11-21 LAB — LACTIC ACID - FIRST REFLEX: LACTIC ACID: 2.1 mmol/L (ref 0.5–2.2)

## 2023-11-21 LAB — CBC WITH DIFF
BASOPHIL #: 0.1 10*3/uL (ref 0.00–0.10)
BASOPHIL %: 1 % (ref 0–1)
EOSINOPHIL #: 0.1 10*3/uL (ref 0.00–0.50)
EOSINOPHIL %: 1 % (ref 1–7)
HCT: 36.6 % (ref 31.2–41.9)
HGB: 12.2 g/dL (ref 10.9–14.3)
LYMPHOCYTE #: 3.2 10*3/uL — ABNORMAL HIGH (ref 1.10–3.10)
LYMPHOCYTE %: 31 % (ref 16–46)
MCH: 26.6 pg (ref 24.7–32.8)
MCHC: 33.5 g/dL (ref 32.3–35.6)
MCV: 79.6 fL (ref 75.5–95.3)
MONOCYTE #: 0.8 10*3/uL (ref 0.20–0.90)
MONOCYTE %: 7 % (ref 4–11)
MPV: 9.1 fL (ref 7.9–10.8)
NEUTROPHIL #: 6.1 10*3/uL (ref 1.90–8.20)
NEUTROPHIL %: 60 % (ref 43–77)
PLATELETS: 237 10*3/uL (ref 140–440)
RBC: 4.59 10*6/uL (ref 3.63–4.92)
RDW: 16.4 % (ref 12.3–17.7)
WBC: 10.2 10*3/uL (ref 3.8–11.8)

## 2023-11-21 LAB — GOLD TOP TUBE

## 2023-11-21 LAB — CREATINE KINASE (CK), TOTAL, SERUM OR PLASMA: CREATINE KINASE: 119 U/L (ref 30–223)

## 2023-11-21 LAB — ETHANOL, SERUM/PLASMA: ETHANOL: <10 mg/dL

## 2023-11-21 LAB — LACTIC ACID LEVEL W/ REFLEX FOR LEVEL >2.0: LACTIC ACID: 2.2 mmol/L (ref 0.5–2.2)

## 2023-11-21 MED ORDER — IBUPROFEN 800 MG TABLET
800.0000 mg | ORAL_TABLET | ORAL | Status: AC
Start: 2023-11-21 — End: 2023-11-21
  Administered 2023-11-21: 800 mg via ORAL

## 2023-11-21 MED ORDER — IBUPROFEN 800 MG TABLET
ORAL_TABLET | ORAL | Status: AC
Start: 2023-11-21 — End: 2023-11-21
  Filled 2023-11-21: qty 1
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
## 2023-11-21 NOTE — ED Nurses Note (Signed)

## 2023-11-21 NOTE — Discharge Instructions (Signed)
Please take Ibuprofen, Naproxen or Tylenol  for pain. These are available over the counter.   You may take Ibuprofen 600 mg every 8 hours with food for pain. Alternatively, you may take Naproxen 250-500 mg every 12 hours with food for pain.  You may also take Tylenol '650mg'$  every 4-6 hours as needed for pain.   Evidence based research has shown taking Ibuprofen or Naproxen & Tylenol together work the same for pain than opioids or narcotic pain medications.

## 2023-11-22 ENCOUNTER — Ambulatory Visit (INDEPENDENT_AMBULATORY_CARE_PROVIDER_SITE_OTHER): Admitting: PHYSICIAN ASSISTANT

## 2023-11-23 ENCOUNTER — Encounter (HOSPITAL_COMMUNITY): Payer: Self-pay

## 2023-11-23 ENCOUNTER — Inpatient Hospital Stay
Admission: RE | Admit: 2023-11-23 | Discharge: 2023-12-01 | DRG: 885 | Disposition: A | Discharge status: Home / Self Care | Source: Other Acute Inpatient Hospital | Attending: Psychiatry | Admitting: Psychiatry

## 2023-11-23 ENCOUNTER — Emergency Department: Admission: EM | Admit: 2023-11-23 | Discharge: 2023-11-23 | Disposition: A | Discharge status: Home / Self Care

## 2023-11-23 ENCOUNTER — Other Ambulatory Visit: Payer: Self-pay

## 2023-11-23 DIAGNOSIS — Z7985 Long-term (current) use of injectable non-insulin antidiabetic drugs: Secondary | ICD-10-CM

## 2023-11-23 DIAGNOSIS — F1024 Alcohol dependence with alcohol-induced mood disorder: Secondary | ICD-10-CM | POA: Diagnosis present

## 2023-11-23 DIAGNOSIS — I110 Hypertensive heart disease with heart failure: Secondary | ICD-10-CM | POA: Diagnosis present

## 2023-11-23 DIAGNOSIS — Z7982 Long term (current) use of aspirin: Secondary | ICD-10-CM

## 2023-11-23 DIAGNOSIS — J4520 Mild intermittent asthma, uncomplicated: Secondary | ICD-10-CM | POA: Diagnosis present

## 2023-11-23 DIAGNOSIS — F1210 Cannabis abuse, uncomplicated: Secondary | ICD-10-CM | POA: Diagnosis present

## 2023-11-23 DIAGNOSIS — F603 Borderline personality disorder: Secondary | ICD-10-CM | POA: Diagnosis present

## 2023-11-23 DIAGNOSIS — Z62810 Personal history of physical and sexual abuse in childhood: Secondary | ICD-10-CM

## 2023-11-23 DIAGNOSIS — Z9151 Personal history of suicidal behavior: Secondary | ICD-10-CM

## 2023-11-23 DIAGNOSIS — R45851 Suicidal ideations: Principal | ICD-10-CM | POA: Diagnosis present

## 2023-11-23 DIAGNOSIS — Z794 Long term (current) use of insulin: Secondary | ICD-10-CM

## 2023-11-23 DIAGNOSIS — Z91148 Patient's other noncompliance with medication regimen for other reason: Secondary | ICD-10-CM

## 2023-11-23 DIAGNOSIS — Z6841 Body Mass Index (BMI) 40.0 and over, adult: Secondary | ICD-10-CM

## 2023-11-23 DIAGNOSIS — Z79899 Other long term (current) drug therapy: Secondary | ICD-10-CM

## 2023-11-23 DIAGNOSIS — F10239 Alcohol dependence with withdrawal, unspecified: Secondary | ICD-10-CM | POA: Diagnosis present

## 2023-11-23 DIAGNOSIS — Z87891 Personal history of nicotine dependence: Secondary | ICD-10-CM

## 2023-11-23 DIAGNOSIS — Z5900 Homelessness unspecified: Secondary | ICD-10-CM

## 2023-11-23 DIAGNOSIS — N390 Urinary tract infection, site not specified: Secondary | ICD-10-CM | POA: Diagnosis present

## 2023-11-23 DIAGNOSIS — E1165 Type 2 diabetes mellitus with hyperglycemia: Secondary | ICD-10-CM | POA: Diagnosis present

## 2023-11-23 DIAGNOSIS — Z7951 Long term (current) use of inhaled steroids: Secondary | ICD-10-CM

## 2023-11-23 DIAGNOSIS — J4489 Other specified chronic obstructive pulmonary disease: Secondary | ICD-10-CM | POA: Diagnosis present

## 2023-11-23 DIAGNOSIS — F410 Panic disorder [episodic paroxysmal anxiety] without agoraphobia: Secondary | ICD-10-CM | POA: Diagnosis present

## 2023-11-23 DIAGNOSIS — I509 Heart failure, unspecified: Secondary | ICD-10-CM | POA: Diagnosis present

## 2023-11-23 DIAGNOSIS — K219 Gastro-esophageal reflux disease without esophagitis: Secondary | ICD-10-CM | POA: Diagnosis present

## 2023-11-23 DIAGNOSIS — F22 Delusional disorders: Secondary | ICD-10-CM | POA: Diagnosis present

## 2023-11-23 DIAGNOSIS — Z560 Unemployment, unspecified: Secondary | ICD-10-CM

## 2023-11-23 DIAGNOSIS — F319 Bipolar disorder, unspecified: Secondary | ICD-10-CM | POA: Insufficient documentation

## 2023-11-23 DIAGNOSIS — F3163 Bipolar disorder, current episode mixed, severe, without psychotic features: Principal | ICD-10-CM | POA: Diagnosis present

## 2023-11-23 DIAGNOSIS — Z7984 Long term (current) use of oral hypoglycemic drugs: Secondary | ICD-10-CM

## 2023-11-23 DIAGNOSIS — F411 Generalized anxiety disorder: Secondary | ICD-10-CM | POA: Insufficient documentation

## 2023-11-23 DIAGNOSIS — Z3202 Encounter for pregnancy test, result negative: Secondary | ICD-10-CM | POA: Insufficient documentation

## 2023-11-23 LAB — ECG 12 LEAD
Atrial Rate: 86 {beats}/min
Calculated P Axis: 74 degrees
Calculated R Axis: 24 degrees
Calculated T Axis: 36 degrees
PR Interval: 142 ms
QRS Duration: 78 ms
QT Interval: 396 ms
QTC Calculation: 473 ms
Ventricular rate: 86 {beats}/min

## 2023-11-23 LAB — CBC WITH DIFF
BASOPHIL #: 0.1 10*3/uL (ref 0.00–0.10)
BASOPHIL %: 1 % (ref 0–1)
EOSINOPHIL #: 0.2 10*3/uL (ref 0.00–0.50)
EOSINOPHIL %: 2 % (ref 1–7)
HCT: 36.4 % (ref 31.2–41.9)
HGB: 12.1 g/dL (ref 10.9–14.3)
LYMPHOCYTE #: 2.8 10*3/uL (ref 1.10–3.10)
LYMPHOCYTE %: 27 % (ref 16–46)
MCH: 26.7 pg (ref 24.7–32.8)
MCHC: 33.2 g/dL (ref 32.3–35.6)
MCV: 80.6 fL (ref 75.5–95.3)
MONOCYTE #: 0.7 10*3/uL (ref 0.20–0.90)
MONOCYTE %: 7 % (ref 4–11)
MPV: 9.1 fL (ref 7.9–10.8)
NEUTROPHIL #: 6.7 10*3/uL (ref 1.90–8.20)
NEUTROPHIL %: 64 % (ref 43–77)
PLATELETS: 234 10*3/uL (ref 140–440)
RBC: 4.51 10*6/uL (ref 3.63–4.92)
RDW: 16.7 % (ref 12.3–17.7)
WBC: 10.4 10*3/uL (ref 3.8–11.8)

## 2023-11-23 LAB — COMPREHENSIVE METABOLIC PANEL, NON-FASTING
ALBUMIN/GLOBULIN RATIO: 1.3 (ref 0.8–1.4)
ALBUMIN: 3.9 g/dL (ref 3.5–5.7)
ALKALINE PHOSPHATASE: 98 U/L (ref 34–104)
ALT (SGPT): 32 U/L (ref 7–52)
ANION GAP: 8 mmol/L (ref 4–13)
AST (SGOT): 20 U/L (ref 13–39)
BILIRUBIN TOTAL: 0.6 mg/dL (ref 0.3–1.0)
BUN/CREA RATIO: 11 (ref 6–22)
BUN: 11 mg/dL (ref 7–25)
CALCIUM, CORRECTED: 9.1 mg/dL (ref 8.9–10.8)
CALCIUM: 9 mg/dL (ref 8.6–10.3)
CHLORIDE: 104 mmol/L (ref 98–107)
CO2 TOTAL: 25 mmol/L (ref 21–31)
CREATININE: 0.99 mg/dL (ref 0.60–1.30)
ESTIMATED GFR: 70 mL/min/{1.73_m2} (ref >59)
GLOBULIN: 3.1 (ref 2.0–3.5)
GLUCOSE: 266 mg/dL — ABNORMAL HIGH (ref 74–109)
OSMOLALITY, CALCULATED: 283 mosm/kg (ref 270–290)
POTASSIUM: 4 mmol/L (ref 3.5–5.1)
PROTEIN TOTAL: 7 g/dL (ref 6.4–8.9)
SODIUM: 137 mmol/L (ref 136–145)

## 2023-11-23 LAB — DRUG SCREEN, NO CONFIRMATION, URINE
AMPHETAMINES URINE: NEGATIVE
BARBITURATES URINE: NEGATIVE
BENZODIAZEPINES URINE: NEGATIVE
BUPRENORPHINE URINE: NEGATIVE
CANNABINOIDS URINE: NEGATIVE
COCAINE METABOLITES URINE: NEGATIVE
FENTANYL, URINE: NEGATIVE
METHADONE URINE: NEGATIVE
OPIATES URINE: NEGATIVE
OXYCODONE URINE: NEGATIVE
PCP URINE: NEGATIVE

## 2023-11-23 LAB — URINALYSIS, MACROSCOPIC
BILIRUBIN: NEGATIVE mg/dL
BLOOD: 1 mg/dL — AB
GLUCOSE: >1000 mg/dL — AB
LEUKOCYTES: 25 WBCs/uL — AB
NITRITE: NEGATIVE
PH: 5.5 (ref 5.0–9.0)
PROTEIN: 70 mg/dL — AB
SPECIFIC GRAVITY: 1.041 — ABNORMAL HIGH (ref 1.002–1.030)
UROBILINOGEN: NORMAL mg/dL

## 2023-11-23 LAB — GRAY TOP TUBE

## 2023-11-23 LAB — POC BLOOD GLUCOSE (RESULTS): GLUCOSE, POC: 311 mg/dL — ABNORMAL HIGH (ref 70–100)

## 2023-11-23 LAB — HCG, SERUM QUALITATIVE, PREGNANCY: PREGNANCY, SERUM QUALITATIVE: NEGATIVE

## 2023-11-23 LAB — SALICYLATE ACID LEVEL: SALICYLATE LEVEL: <3 mg/dL (ref <=30)

## 2023-11-23 LAB — URINALYSIS, MICROSCOPIC
RBCS: 3638 /HPF — ABNORMAL HIGH (ref <4)
SQUAMOUS EPITHELIAL: 7 /HPF (ref <28)
WBCS: 376 /HPF — ABNORMAL HIGH (ref <6)

## 2023-11-23 LAB — ACETAMINOPHEN LEVEL: ACETAMINOPHEN LEVEL: <10 ug/mL — ABNORMAL LOW (ref 10–30)

## 2023-11-23 LAB — ETHANOL, SERUM/PLASMA: ETHANOL: <10 mg/dL

## 2023-11-23 MED ORDER — INSULIN LISPRO 100 UNIT/ML SUB-Q SSIP VIAL
3.0000 [IU] | INJECTION | Freq: Four times a day (QID) | SUBCUTANEOUS | Status: DC
Start: 2023-11-23 — End: 2023-12-01
  Administered 2023-11-23: 11 [IU] via SUBCUTANEOUS
  Administered 2023-11-24: 0 [IU] via SUBCUTANEOUS
  Administered 2023-11-24: 11 [IU] via SUBCUTANEOUS
  Administered 2023-11-24: 3 [IU] via SUBCUTANEOUS
  Administered 2023-11-24: 8 [IU] via SUBCUTANEOUS
  Administered 2023-11-25: 3 [IU] via SUBCUTANEOUS
  Administered 2023-11-25: 0 [IU] via SUBCUTANEOUS
  Administered 2023-11-25 (×2): 5 [IU] via SUBCUTANEOUS
  Administered 2023-11-26: 8 [IU] via SUBCUTANEOUS
  Administered 2023-11-26: 3 [IU] via SUBCUTANEOUS
  Administered 2023-11-26: 14 [IU] via SUBCUTANEOUS
  Administered 2023-11-26: 3 [IU] via SUBCUTANEOUS
  Administered 2023-11-27: 5 [IU] via SUBCUTANEOUS
  Administered 2023-11-27: 3 [IU] via SUBCUTANEOUS
  Administered 2023-11-27: 14 [IU] via SUBCUTANEOUS
  Administered 2023-11-27: 8 [IU] via SUBCUTANEOUS
  Administered 2023-11-28: 3 [IU] via SUBCUTANEOUS
  Administered 2023-11-28: 14 [IU] via SUBCUTANEOUS
  Administered 2023-11-28: 8 [IU] via SUBCUTANEOUS
  Administered 2023-11-28 – 2023-11-29 (×2): 3 [IU] via SUBCUTANEOUS
  Administered 2023-11-29: 8 [IU] via SUBCUTANEOUS
  Administered 2023-11-29: 3 [IU] via SUBCUTANEOUS
  Administered 2023-11-29: 8 [IU] via SUBCUTANEOUS
  Administered 2023-11-29: 14 [IU] via SUBCUTANEOUS
  Administered 2023-11-30: 5 [IU] via SUBCUTANEOUS
  Administered 2023-11-30: 3 [IU] via SUBCUTANEOUS
  Administered 2023-11-30: 8 [IU] via SUBCUTANEOUS
  Administered 2023-11-30: 0 [IU] via SUBCUTANEOUS
  Administered 2023-12-01: 3 [IU] via SUBCUTANEOUS
  Filled 2023-11-23: qty 5
  Filled 2023-11-23: qty 14
  Filled 2023-11-23: qty 11
  Filled 2023-11-23: qty 8
  Filled 2023-11-23: qty 3
  Filled 2023-11-23: qty 14
  Filled 2023-11-23: qty 11
  Filled 2023-11-23: qty 14
  Filled 2023-11-23: qty 8
  Filled 2023-11-23: qty 5
  Filled 2023-11-23 (×2): qty 3
  Filled 2023-11-23: qty 5
  Filled 2023-11-23: qty 8
  Filled 2023-11-23 (×5): qty 3
  Filled 2023-11-23 (×2): qty 8
  Filled 2023-11-23: qty 3
  Filled 2023-11-23: qty 14
  Filled 2023-11-23 (×2): qty 3
  Filled 2023-11-23: qty 8
  Filled 2023-11-23 (×2): qty 3

## 2023-11-23 MED ORDER — GLUCAGON HCL 1 MG/ML SOLUTION FOR INJECTION
1.0000 mg | Freq: Once | INTRAMUSCULAR | Status: DC | PRN
Start: 2023-11-23 — End: 2023-12-01

## 2023-11-23 MED ORDER — CEPHALEXIN 500 MG CAPSULE
500.0000 mg | ORAL_CAPSULE | Freq: Four times a day (QID) | ORAL | 0 refills | Status: DC
Start: 2023-11-23 — End: 2023-12-01

## 2023-11-23 MED ORDER — ALUMINUM-MAG HYDROXIDE-SIMETHICONE 200 MG-200 MG-20 MG/5 ML ORAL SUSP
30.0000 mL | ORAL | Status: DC | PRN
Start: 2023-11-23 — End: 2023-12-01

## 2023-11-23 MED ORDER — DEXTROSE 50 % IN WATER (D50W) INTRAVENOUS SYRINGE
12.5000 g | INJECTION | INTRAVENOUS | Status: DC | PRN
Start: 2023-11-23 — End: 2023-12-01

## 2023-11-23 MED ORDER — DEXTROSE 40 % ORAL GEL
15.0000 g | ORAL | Status: DC | PRN
Start: 2023-11-23 — End: 2023-12-01

## 2023-11-23 MED ORDER — MAGNESIUM HYDROXIDE 400 MG/5 ML ORAL SUSPENSION
30.0000 mL | Freq: Every evening | ORAL | Status: DC | PRN
Start: 2023-11-23 — End: 2023-12-01

## 2023-11-23 MED ORDER — TRAZODONE 50 MG TABLET
50.0000 mg | ORAL_TABLET | Freq: Every evening | ORAL | Status: DC | PRN
Start: 2023-11-23 — End: 2023-11-24
  Administered 2023-11-23: 50 mg via ORAL
  Filled 2023-11-23: qty 1

## 2023-11-23 MED ORDER — LORAZEPAM 1 MG TABLET
1.0000 mg | ORAL_TABLET | ORAL | Status: AC | PRN
Start: 2023-11-23 — End: 2023-11-24
  Administered 2023-11-23: 1 mg via ORAL
  Filled 2023-11-23: qty 1

## 2023-11-23 MED ORDER — LORAZEPAM 2 MG/ML INJECTION WRAPPER
1.0000 mg | INTRAMUSCULAR | Status: AC | PRN
Start: 2023-11-23 — End: 2023-11-24

## 2023-11-23 NOTE — ED Notes (Signed)
 [facility] - Emergency Department  Peer Recovery Coach Assessment        Plan  Was the patient referred to treatment?: Yes  What level of treatment was the patient referred to?: Partial Hospitalization Services  Program name: [facility]  Program address: [facility]  Program phone number: [phone]    Was patient referred to physician for Buprenorphine Assessment in the ED?: No    Did patient receive Narcan in the ED?: No    Plan: Additional Comments: Referral to [facility] for suidical ideation with no plan.    Follow-up     Date of intake appointment: 11/23/23     Need for additional follow-up?: Yes       Suzen Satchel, Peer Recovery Coach 11/23/2023 16:26

## 2023-11-23 NOTE — ED Nurses Note (Signed)
 Medical clearance and labs faxed to [facility]. [facility] did receive referral and will call ED to complete a phone intake with patient.

## 2023-11-23 NOTE — ED Notes (Signed)
 [facility] - Emergency Department  Peer Recovery Coach Assessment    Initial Evaluation  Referred by:: Nurse  Location of Evaluation: Emergency Department  How many times in the last 12 months have you been to the ED?: 6 or more  Have you ever served or are you currently serving in the Armed Forces?: No             Substance Use History  Patient current substance use status: Patient states that last night she was having thoughts of not wanting to be here anymore and has been experiencing panic attacks. Patient states she is about to be evicted from her apartment and is feeling overwhelmed and is requesting to go to treatment for her mental health. Patient states she drinks wine every day when she can afford it. Patient last drank at approximately 1:30am this morning. Patient said she had 3 glasses and mixed it with prescription medication she just started, hydroxyzine. Patient's blood alcohol level was below 10.   Two workers from [facility] accompanied patient to ED. [facility] spoke with [facility] and they will have a female bed available later this afternoon.  PRSS will follow up with patient.    Prior treatment history?: Yes (2015 patient was at [facility] for mental health)  Prior treatment program types: Inpatient  Number of prior treatment admissions: 1    Currently enrolled in substance use program?: No    Within the last 30 days, what substances has the patient used?: Alcohol  Patient's age at first substance use?: 12-15 (14 yrs.)  Drug route of administration: Oral                   Family, Social, Home & Safety History  Marital Status: Divorced    Number of children:  (grown children)       Need to improve relationships with family?: Yes    Social network: Immediate family, Non-substance using peers/friends/other, Substance using peers    Current living situation: Independent (States she is being evicted.)  Any help needed with the following?: None  Contact phone number for the patient:  [phone]  Emergency contact name and phone number: Barnie Sewer, mother, [phone]    Has the patient had any legal issues within the past 30 days?: None         Employment  Current employment status: Unemployed    Needs vocational training?: No  Needs assistance with job search?: No    Engagement  Readiness ruler: 4  Summary of assessment priority areas: Mental Health  Summary of assessment priority areas: comments: Patient is experiencing suicidal ideations.    Brief Intervention  Discussed plan to reduce/quit substance use?: Yes (Yes, but unsure patient is being honest about alcohol use. Patient is experiencing suicidal ideations.)  Discussed willingness to enter treatment?: Yes (Patient is requesting treatment for mental health.)  Indicated patient's stage of change:: 2 - Contemplation    Patient seen by Peer Recovery Coach and is a candidate for buprenorphine administration in the ED. Patient needs assessment for bup treatment.: No    Plan  Was the patient referred to treatment?: Yes  What level of treatment was the patient referred to?: Partial Hospitalization Services  Program name: [facility]  Program address: [address]  Program phone number: [phone]    Was patient referred to physician for Buprenorphine Assessment in the ED?: No    Did patient receive Narcan in the ED?: No    Plan: Additional Comments: Referral for  mental health to [facility] when patient is medically cleared.  Patient may be referred to [facility] if there are no beds available to [facility]. PRSS will follow up with patient.    Follow-up     Date of intake appointment: 11/23/23     Need for additional follow-up?: Yes       Suzen Satchel, Peer Recovery Coach 11/23/2023 13:34
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
## 2023-11-23 NOTE — ED Triage Notes (Signed)
 Pt requesting to go to [facility] for mental health. Pt states I don't even know what I'm doing in life. I just want to drive until I run out of gas. Last night, I was having thoughts of not wanting to be here. Pt reports she had two panic attacks prior to coming into ED. Pt denies SI/HI

## 2023-11-23 NOTE — ED Nurses Note (Signed)
 Report called to [facility] [place]

## 2023-11-24 ENCOUNTER — Encounter (HOSPITAL_PSYCHIATRIC): Payer: Self-pay

## 2023-11-24 ENCOUNTER — Ambulatory Visit (HOSPITAL_COMMUNITY): Payer: Self-pay | Admitting: Nurse Practitioner

## 2023-11-24 ENCOUNTER — Encounter (HOSPITAL_PSYCHIATRIC): Payer: Self-pay | Admitting: Psychiatry

## 2023-11-24 DIAGNOSIS — I10 Essential (primary) hypertension: Secondary | ICD-10-CM

## 2023-11-24 DIAGNOSIS — N390 Urinary tract infection, site not specified: Secondary | ICD-10-CM

## 2023-11-24 DIAGNOSIS — J45909 Unspecified asthma, uncomplicated: Secondary | ICD-10-CM

## 2023-11-24 DIAGNOSIS — K219 Gastro-esophageal reflux disease without esophagitis: Secondary | ICD-10-CM

## 2023-11-24 DIAGNOSIS — Z7984 Long term (current) use of oral hypoglycemic drugs: Secondary | ICD-10-CM

## 2023-11-24 DIAGNOSIS — Z87891 Personal history of nicotine dependence: Secondary | ICD-10-CM

## 2023-11-24 DIAGNOSIS — J4489 Other specified chronic obstructive pulmonary disease: Secondary | ICD-10-CM

## 2023-11-24 DIAGNOSIS — E119 Type 2 diabetes mellitus without complications: Secondary | ICD-10-CM

## 2023-11-24 DIAGNOSIS — Z794 Long term (current) use of insulin: Secondary | ICD-10-CM

## 2023-11-24 DIAGNOSIS — Z7722 Contact with and (suspected) exposure to environmental tobacco smoke (acute) (chronic): Secondary | ICD-10-CM

## 2023-11-24 LAB — LIPID PANEL
CHOL/HDL RATIO: 4.4
CHOLESTEROL: 177 mg/dL (ref <200)
HDL CHOL: 40 mg/dL (ref >=40)
LDL CALC: 88 mg/dL (ref 0–100)
TRIGLYCERIDES: 246 mg/dL — ABNORMAL HIGH (ref <=150)
VLDL CALC: 49 mg/dL (ref 0–50)

## 2023-11-24 LAB — POC BLOOD GLUCOSE (RESULTS)
GLUCOSE, POC: 261 mg/dL — ABNORMAL HIGH (ref 70–100)
GLUCOSE, POC: 324 mg/dL — ABNORMAL HIGH (ref 70–100)
GLUCOSE, POC: 158 mg/dL — ABNORMAL HIGH (ref 70–100)
GLUCOSE, POC: 165 mg/dL — ABNORMAL HIGH (ref 70–100)

## 2023-11-24 LAB — HGA1C (HEMOGLOBIN A1C WITH EST AVG GLUCOSE): HEMOGLOBIN A1C: 8.4 % — ABNORMAL HIGH (ref 4.0–6.0)

## 2023-11-24 LAB — GLUCOSE, NON FASTING: GLUCOSE: 266 mg/dL — ABNORMAL HIGH (ref 74–109)

## 2023-11-24 MED ORDER — ERGOCALCIFEROL (VITAMIN D2) 1,250 MCG (50,000 UNIT) CAPSULE
50000.0000 [IU] | ORAL_CAPSULE | ORAL | Status: DC
Start: 2023-11-26 — End: 2023-12-01
  Administered 2023-11-26: 50000 [IU] via ORAL
  Filled 2023-11-24: qty 1

## 2023-11-24 MED ORDER — DULOXETINE 20 MG CAPSULE,DELAYED RELEASE
20.0000 mg | DELAYED_RELEASE_CAPSULE | Freq: Every day | ORAL | Status: DC
Start: 2023-11-24 — End: 2023-11-28
  Administered 2023-11-24 – 2023-11-28 (×5): 20 mg via ORAL
  Filled 2023-11-24 (×5): qty 1

## 2023-11-24 MED ORDER — ASPIRIN 81 MG CHEWABLE TABLET
81.0000 mg | CHEWABLE_TABLET | Freq: Every day | ORAL | Status: DC
Start: 2023-11-24 — End: 2023-12-01
  Administered 2023-11-24 – 2023-12-01 (×8): 81 mg via ORAL
  Filled 2023-11-24 (×8): qty 1

## 2023-11-24 MED ORDER — CARVEDILOL 6.25 MG TABLET
12.5000 mg | ORAL_TABLET | Freq: Two times a day (BID) | ORAL | Status: DC
Start: 2023-11-24 — End: 2023-12-01
  Administered 2023-11-24 – 2023-12-01 (×15): 12.5 mg via ORAL
  Filled 2023-11-24 (×15): qty 2

## 2023-11-24 MED ORDER — DILTIAZEM ER 60 MG CAPSULE,EXTENDED RELEASE 12 HR
60.0000 mg | ORAL_CAPSULE | Freq: Two times a day (BID) | ORAL | Status: DC
Start: 2023-11-24 — End: 2023-11-24
  Administered 2023-11-24: 0 mg via ORAL
  Filled 2023-11-24 (×3): qty 1

## 2023-11-24 MED ORDER — HYDROCHLOROTHIAZIDE 25 MG TABLET
25.0000 mg | ORAL_TABLET | Freq: Every day | ORAL | Status: DC
Start: 2023-11-24 — End: 2023-12-01
  Administered 2023-11-24 – 2023-12-01 (×8): 25 mg via ORAL
  Filled 2023-11-24 (×8): qty 1

## 2023-11-24 MED ORDER — LORAZEPAM 1 MG TABLET
1.0000 mg | ORAL_TABLET | Freq: Two times a day (BID) | ORAL | Status: AC
Start: 2023-11-25 — End: 2023-11-26
  Administered 2023-11-25 (×2): 1 mg via ORAL
  Filled 2023-11-24 (×2): qty 1

## 2023-11-24 MED ORDER — RANOLAZINE ER 500 MG TABLET,EXTENDED RELEASE,12 HR
500.0000 mg | ORAL_TABLET | Freq: Two times a day (BID) | ORAL | Status: DC
Start: 2023-11-24 — End: 2023-12-01
  Administered 2023-11-24 – 2023-12-01 (×15): 500 mg via ORAL
  Filled 2023-11-24 (×15): qty 1

## 2023-11-24 MED ORDER — GLIMEPIRIDE 2 MG TABLET
4.0000 mg | ORAL_TABLET | Freq: Two times a day (BID) | ORAL | Status: DC
Start: 2023-11-24 — End: 2023-12-01
  Administered 2023-11-24 – 2023-12-01 (×14): 4 mg via ORAL
  Filled 2023-11-24 (×15): qty 2

## 2023-11-24 MED ORDER — LORAZEPAM 0.5 MG TABLET
0.2500 mg | ORAL_TABLET | Freq: Every evening | ORAL | Status: AC
Start: 2023-11-30 — End: 2023-12-01
  Administered 2023-11-30: 0.25 mg via ORAL
  Filled 2023-11-24: qty 1

## 2023-11-24 MED ORDER — ALBUTEROL SULFATE HFA 90 MCG/ACTUATION AEROSOL INHALER - RN
2.0000 | Freq: Four times a day (QID) | RESPIRATORY_TRACT | Status: DC | PRN
Start: 2023-11-24 — End: 2023-12-01
  Administered 2023-11-25 – 2023-11-30 (×7): 2 via RESPIRATORY_TRACT
  Filled 2023-11-24: qty 6.7

## 2023-11-24 MED ORDER — LORAZEPAM 0.5 MG TABLET
0.2500 mg | ORAL_TABLET | Freq: Two times a day (BID) | ORAL | Status: AC
Start: 2023-11-29 — End: 2023-11-30
  Administered 2023-11-29 (×2): 0.25 mg via ORAL
  Filled 2023-11-24 (×2): qty 1

## 2023-11-24 MED ORDER — INSULIN GLARGINE 100 UNITS/ML SUBQ - CHARGE BY DOSE
15.0000 [IU] | Freq: Every evening | SUBCUTANEOUS | Status: DC
Start: 2023-11-24 — End: 2023-11-28
  Administered 2023-11-24 – 2023-11-27 (×4): 15 [IU] via SUBCUTANEOUS
  Filled 2023-11-24 (×4): qty 15

## 2023-11-24 MED ORDER — ATORVASTATIN 40 MG TABLET
40.0000 mg | ORAL_TABLET | Freq: Every evening | ORAL | Status: DC
Start: 2023-11-24 — End: 2023-12-01
  Administered 2023-11-24 – 2023-11-30 (×7): 40 mg via ORAL
  Filled 2023-11-24 (×8): qty 1

## 2023-11-24 MED ORDER — TIOTROPIUM BROMIDE 2.5 MCG/ACTUATION MIST FOR INHALATION - RN
2.0000 | Freq: Every day | RESPIRATORY_TRACT | Status: DC
Start: 2023-11-24 — End: 2023-12-01
  Administered 2023-11-24 – 2023-11-28 (×5): 2 via RESPIRATORY_TRACT
  Administered 2023-11-29: 0 via RESPIRATORY_TRACT
  Administered 2023-11-30 – 2023-12-01 (×2): 2 via RESPIRATORY_TRACT
  Filled 2023-11-24: qty 4

## 2023-11-24 MED ORDER — GABAPENTIN 300 MG CAPSULE
300.0000 mg | ORAL_CAPSULE | Freq: Three times a day (TID) | ORAL | Status: AC
Start: 2023-11-24 — End: 2023-11-28
  Administered 2023-11-24 – 2023-11-27 (×12): 300 mg via ORAL
  Filled 2023-11-24 (×12): qty 1

## 2023-11-24 MED ORDER — LOSARTAN 50 MG TABLET
50.0000 mg | ORAL_TABLET | Freq: Every day | ORAL | Status: DC
Start: 2023-11-24 — End: 2023-12-01
  Administered 2023-11-24 – 2023-12-01 (×8): 50 mg via ORAL
  Filled 2023-11-24 (×8): qty 1

## 2023-11-24 MED ORDER — HYDROXYZINE PAMOATE 25 MG CAPSULE
25.0000 mg | ORAL_CAPSULE | Freq: Three times a day (TID) | ORAL | Status: DC | PRN
Start: 2023-11-24 — End: 2023-11-24

## 2023-11-24 MED ORDER — LORAZEPAM 0.5 MG TABLET
0.2500 mg | ORAL_TABLET | Freq: Three times a day (TID) | ORAL | Status: AC
Start: 2023-11-28 — End: 2023-11-29
  Administered 2023-11-28 (×3): 0.25 mg via ORAL
  Filled 2023-11-24 (×3): qty 1

## 2023-11-24 MED ORDER — LORAZEPAM 1 MG TABLET
1.0000 mg | ORAL_TABLET | Freq: Three times a day (TID) | ORAL | Status: AC
Start: 2023-11-24 — End: 2023-11-24
  Administered 2023-11-24 (×3): 1 mg via ORAL
  Filled 2023-11-24 (×3): qty 1

## 2023-11-24 MED ORDER — LORAZEPAM 0.5 MG TABLET
0.5000 mg | ORAL_TABLET | Freq: Two times a day (BID) | ORAL | Status: AC
Start: 2023-11-27 — End: 2023-11-28
  Administered 2023-11-27 (×2): 0.5 mg via ORAL
  Filled 2023-11-24 (×2): qty 1

## 2023-11-24 MED ORDER — CEPHALEXIN 500 MG CAPSULE
500.0000 mg | ORAL_CAPSULE | Freq: Four times a day (QID) | ORAL | Status: AC
Start: 2023-11-24 — End: 2023-11-30
  Administered 2023-11-24 – 2023-11-29 (×24): 500 mg via ORAL
  Filled 2023-11-24 (×25): qty 1

## 2023-11-24 MED ORDER — OMEPRAZOLE 40 MG CAPSULE,DELAYED RELEASE
40.0000 mg | DELAYED_RELEASE_CAPSULE | Freq: Every day | ORAL | Status: DC
Start: 2023-11-24 — End: 2023-12-01
  Administered 2023-11-24 – 2023-12-01 (×8): 40 mg via ORAL

## 2023-11-24 MED ORDER — THYROID (PORK) 60 MG TABLET
180.0000 mg | ORAL_TABLET | Freq: Every day | ORAL | Status: DC
Start: 2023-11-24 — End: 2023-12-01
  Administered 2023-11-24 – 2023-12-01 (×8): 180 mg via ORAL
  Filled 2023-11-24 (×8): qty 3

## 2023-11-24 MED ORDER — DAPAGLIFLOZIN PROPANEDIOL 10 MG TABLET
10.0000 mg | ORAL_TABLET | Freq: Every day | ORAL | Status: DC
Start: 2023-11-24 — End: 2023-12-01
  Administered 2023-11-24 – 2023-12-01 (×8): 10 mg via ORAL
  Filled 2023-11-24 (×8): qty 1

## 2023-11-24 MED ORDER — LORAZEPAM 0.5 MG TABLET
0.5000 mg | ORAL_TABLET | Freq: Three times a day (TID) | ORAL | Status: AC
Start: 2023-11-26 — End: 2023-11-27
  Administered 2023-11-26 (×3): 0.5 mg via ORAL
  Filled 2023-11-24 (×3): qty 1

## 2023-11-24 MED ORDER — PANTOPRAZOLE 40 MG TABLET,DELAYED RELEASE
40.0000 mg | DELAYED_RELEASE_TABLET | Freq: Every day | ORAL | Status: DC
Start: 2023-11-24 — End: 2023-11-24
  Filled 2023-11-24: qty 1

## 2023-11-24 NOTE — Consults (Signed)
 [facility]  Hospitalist Consultation    Date of Service:  11/24/2023  Jennifer [name]   48 y.o. female  Date of Admission:  11/23/2023  Date of Birth:  05-14-1976  1         History of Present Illness:    Patient is a 48 year old African American female who is here for suicide ideation.  She has a history of diabetes, hypertension, gastroesophageal reflux disease Chronic Obstructive Pulmonary Disease and asthma.  She is found to have urinary tract infection in the emergency department started on Keflex .  She states she does drink a lot and drinks wine "every day from the morning until I pass out" she states she is wanting to get some help for that                History:    Past Medical:    Past Medical History:   Diagnosis Date    Asthma     COPD (chronic obstructive pulmonary disease)     Diabetes mellitus, type 2     HTN (hypertension)     Narcolepsy     OSA (obstructive sleep apnea)     CPAP    Thyroid  disease       Past Surgical:    Past Surgical History:   Procedure Laterality Date    HX CHOLECYSTECTOMY      HX TUBAL LIGATION      SHOULDER SURGERY Bilateral       Family:    Family Medical History:       Problem Relation (Age of Onset)    No Known Problems Mother, Father, Sister, Brother           Social:   reports that she quit smoking about 5 years ago. Her smoking use included cigarettes. She started smoking about 34 years ago. She has a 14.5 pack-year smoking history. She has been exposed to tobacco smoke. She has quit using smokeless tobacco. She reports current alcohol use. She reports that she does not currently use drugs.     REVIEW OF SYSTEMS:  Full ROS performed. ROS negative if not mentioned in HPI.    Allergies[1]    Medications:  Medications Prior to Admission       Prescriptions    acarbose (PRECOSE) 100 mg Oral Tablet    Take 1 Tablet (100 mg total) by mouth Twice daily    albuterol  sulfate (PROVENTIL  OR VENTOLIN  OR PROAIR ) 90 mcg/actuation Inhalation oral inhaler     Take 1-2 Puffs by inhalation Every 6 hours as needed    ARIPiprazole (ABILIFY) 10 mg Oral Tablet    Take 1 Tablet (10 mg total) by mouth Every night    aspirin  (ECOTRIN) 81 mg Oral Tablet, Delayed Release (E.C.)    Take 1 Tablet (81 mg total) by mouth Daily    atorvastatin  (LIPITOR) 40 mg Oral Tablet    Take 1 Tablet (40 mg total) by mouth Every evening    carvediloL  (COREG ) 12.5 mg Oral Tablet    Take 1 Tablet (12.5 mg total) by mouth Twice daily with food    cephalexin  (KEFLEX ) 500 mg Oral Capsule    Take 1 Capsule (500 mg total) by mouth Four times a day for 7 days Indications: Urinary tract infection    dilTIAZem (CARDIZEM SR) 60 mg Oral Capsule, Sust. Release 12 hr    Take 1 Capsule (60 mg total) by mouth Twice daily    diphenhydrAMINE  (BANOPHEN ) 25 mg  Oral Tablet    Take 1 Tablet (25 mg total) by mouth Every 6 hours as needed    DULoxetine (CYMBALTA DR) 30 mg Oral Capsule, Delayed Release(E.C.)    Take 1 Capsule (30 mg total) by mouth Daily    empagliflozin  (JARDIANCE ) 25 mg Oral Tablet    Take 1 Tablet (25 mg total) by mouth Daily    ergocalciferol, vitamin D2, (DRISDOL) 1,250 mcg (50,000 unit) Oral Capsule    Take 1 Capsule (50,000 Units total) by mouth Every 7 days    furosemide  (LASIX ) 20 mg Oral Tablet    Take 1 Tablet (20 mg total) by mouth Once per day as needed for Other (fluid retention) for up to 360 days    gabapentin  (NEURONTIN ) 800 mg Oral Tablet    Take 1 Tablet (800 mg total) by mouth Three times a day    glimepiride (AMARYL) 4 mg Oral Tablet    Take 1 Tablet (4 mg total) by mouth Twice daily    hydroCHLOROthiazide  (HYDRODIURIL ) 25 mg Oral Tablet    Take 1 Tablet (25 mg total) by mouth Daily    hydrOXYzine HCL (ATARAX) 25 mg Oral Tablet    Take 1 Tablet (25 mg total) by mouth Three times a day as needed    Ibuprofen  (MOTRIN ) 600 mg Oral Tablet    Take 1 Tablet (600 mg total) by mouth Four times a day as needed for Pain    insulin  glargine 100 unit/mL Subcutaneous injection (vial)    Inject 15  Units under the skin Every evening    ipratropium-albuterol  0.5 mg-3 mg(2.5 mg base)/3 mL Solution for Nebulization    Take 3 mL by nebulization Four times a day    loratadine (CLARITIN) 10 mg Oral Tablet    Take 1 Tablet (10 mg total) by mouth Daily    mometasone-formoterol  (DULERA ) 100-5 mcg/dose Inhalation HFA Aerosol Inhaler    Take 2 Puffs by inhalation Twice daily Rinse mouth after each use of inhaled steroid.    montelukast  (SINGULAIR ) 10 mg Oral Tablet    Take 1 Tablet (10 mg total) by mouth Every evening    omeprazole (PRILOSEC) 40 mg Oral Capsule, Delayed Release(E.C.)    Take 1 Capsule (40 mg total) by mouth Daily    OZEMPIC 1 mg/dose (4 mg/3 mL) Subcutaneous Pen Injector    Inject 1 mg under the skin Every 7 days    ranolazine  (RANEXA ) 500 mg Oral Tablet Sustained Release 12 hr    Take 1 Tablet (500 mg total) by mouth Twice daily    telmisartan  (MICARDIS ) 40 mg Oral Tablet    Take 1 Tablet (40 mg total) by mouth Daily    thyroid  (ARMOUR THYROID ) 180 mg Oral Tablet    Take 1 Tablet (180 mg total) by mouth Daily    tiotropium bromide (SPIRIVA  RESPIMAT) 2.5 mcg/actuation Inhalation oral inhaler    Take 2 Inhalations (2 Puffs total) by inhalation Daily          albuterol  90 mcg per inhalation oral inhaler - Nursing to administer, 2 Puff, Inhalation, Q6H PRN  aluminum -magnesium  hydroxide-simethicone  (MAG-AL PLUS) 200-200-20 mg per 5 mL oral liquid, 30 mL, Oral, Q4H PRN  aspirin  chewable tablet 81 mg, 81 mg, Oral, Daily  atorvastatin  (LIPITOR) tablet, 40 mg, Oral, QPM  carvedilol  (COREG ) tablet, 12.5 mg, Oral, 2x/day-Food  cephalexin  (KEFLEX ) capsule, 500 mg, Oral, 4x/day  Correction/SSIP insulin  lispro 100 units/mL injection, 3-14 Units, Subcutaneous, 4x/day AC  dapagliflozin (FARXIGA) tablet, 10 mg, Oral,  Daily  dextrose  (GLUTOSE) 40% oral gel, 15 g, Oral, Q15 Min PRN  dextrose  50% (0.5 g/mL) injection - syringe, 12.5 g, Intravenous, Q15 Min PRN  DULoxetine (CYMBALTA) delayed release capsule, 20 mg, Oral,  Daily  [START ON 11/26/2023] ergocalciferol-vitamin D2 (DRISDOL) capsule, 50,000 Units, Oral, Q7 Days  gabapentin  (NEURONTIN ) capsule, 300 mg, Oral, 3x/day  glimepiride (AMARYL) tablet, 4 mg, Oral, 2X/day  glucagon  injection 1 mg, 1 mg, IntraMUSCULAR, Once PRN  hydroCHLOROthiazide  (HYDRODIURIL ) tablet, 25 mg, Oral, Daily  insulin  glargine 100 units/mL injection, 15 Units, Subcutaneous, QPM  LORazepam  (ATIVAN ) tablet, 1 mg, Oral, Q2H PRN   Or  LORazepam  (ATIVAN ) 2 mg/mL injection, 1 mg, IntraMUSCULAR, Q2H PRN  LORazepam  (ATIVAN ) tablet, 1 mg, Oral, 3x/day  [START ON 11/25/2023] LORazepam  (ATIVAN ) tablet, 1 mg, Oral, 2x/day  [START ON 11/26/2023] LORazepam  (ATIVAN ) tablet, 0.5 mg, Oral, 3x/day  [START ON 11/27/2023] LORazepam  (ATIVAN ) tablet, 0.5 mg, Oral, 2x/day  [START ON 11/28/2023] LORazepam  (ATIVAN ) tablet, 0.25 mg, Oral, 3x/day  [START ON 11/29/2023] LORazepam  (ATIVAN ) tablet, 0.25 mg, Oral, 2x/day  [START ON 11/30/2023] LORazepam  (ATIVAN ) tablet, 0.25 mg, Oral, NIGHTLY  losartan (COZAAR) tablet, 50 mg, Oral, Daily  magnesium  hydroxide (MILK OF MAGNESIA) 400mg  per 5mL oral liquid, 30 mL, Oral, HS PRN  ranolazine  (RANEXA ) extended release tablet, 500 mg, Oral, 2x/day  thyroid  (ARMOUR THYROID ) tablet, 180 mg, Oral, Daily  tiotropium bromide (SPIRIVA  RESPIMAT) 2.5 mcg per inhalation oral inhaler - Nursing to administer, 2 Puff, Inhalation, Daily        Physical Exam:  Vitals:  Temperature: 36.7 C (98 F)  Heart Rate: 83  Respiratory Rate: 16  BP (Non-Invasive): 120/70  SpO2: 98 %    Exam:  Nursing note and vitals reviewed.   General: No acute distress, alert and oriented x3  HEENT: Pharynx without no erythema, exudate. PERRLA, conjunctivae are without injection or scleral icterus  Neck: Neck supple without lymphadenopathy. Thyroid  non-tender and without nodules.  Cardio: Regular rate and rhythm. Normal S1 and S2. No murmurs, gallops, or rubs. PMI located in the midclavicular line.   Resp: Clear to auscultation  bilaterally. No wheezes, rales, rhonchi or crackles. Normal resp effort.  Abd: Bowel sounds present in all 4 quadrants. Negative murphy's sign. No rebound tenderness or involuntary guarding. Liver and spleen not palpable.  GU: Deferred exam  Extremities: No edema or swelling noted. No gait disturbances or weakness. Muscle strength 5/5 in bilateral upper and lower extremities.  Skin: No rashes, ulcers, or bruising.  Neuro: CN II - XII grossly intact  I: smell Not tested   II: visual acuity  Visual acuity appears normal bilaterally   II: visual fields Full to confrontation   II: pupils Equal, round, reactive to light   III,VII: ptosis none   III,IV,VI: extraocular muscles  Full ROM   V: mastication normal   V: facial light touch sensation  normal   V,VII: corneal reflex  present   VII: facial muscle function - upper  normal   VII: facial muscle function - lower normal   VIII: hearing Not tested   IX: soft palate elevation  normal   IX,X: gag reflex present   XI: trapezius strength  5/5   XI: sternocleidomastoid strength 5/5   XI: neck flexion strength  5/5   XII: tongue strength  normal      [place]  [employer] [facility]  [address]  [phone]  Phone [phone]  [place], [place]  [address]      Fax: [phone]        Labs:     Results for orders placed or performed during the hospital encounter of 11/23/23 (from the past 24 hours)   POC BLOOD GLUCOSE (RESULTS)   Result Value Ref Range    GLUCOSE, POC 311 (H) 70 - 100 mg/dl   LIPID PANEL   Result Value Ref Range    CHOLESTEROL 177 <200 mg/dL    TRIGLYCERIDES 753 (H) <=150 mg/dL    HDL CHOL 40 >=59 mg/dL    LDL CALC 88 0 - 899 mg/dL    VLDL CALC 49 0 - 50 mg/dL    CHOL/HDL RATIO 4.4    HGA1C (HEMOGLOBIN A1C WITH EST AVG GLUCOSE)   Result Value Ref Range    HEMOGLOBIN A1C 8.4 (H) 4.0 - 6.0 %   GLUCOSE, NON FASTING   Result Value Ref Range    GLUCOSE 266 (H) 74 - 109 mg/dL   POC  BLOOD GLUCOSE (RESULTS)   Result Value Ref Range    GLUCOSE, POC 261 (H) 70 - 100 mg/dl   POC BLOOD GLUCOSE (RESULTS)   Result Value Ref Range    GLUCOSE, POC 324 (H) 70 - 100 mg/dl       Imaging Studies:        ECG 12 LEAD  Normal sinus rhythm  Normal ECG  When compared with ECG of 12-Nov-2023 14:31,  No significant change was found  Confirmed by Coleta Drivers [id number]) on 11/23/2023 4:59:40 PM      Assessment/Plan:  Diabetes.  Her hemoglobin A1c is 8.4% we have continued her home medications.  We will adjust as needed.  For her hypertension, gastroesophageal reflux disease we will continue home medications.  She states she would rather have Prilosec than Protonix  and she brought it from home so we will see if we can make that happen for her.  She is also on Keflex  for urinary tract infection    Problem List:  Active Hospital Problems   (*Primary Problem)    Diagnosis    *Suicidal ideation       DVT/PE Prophylaxis:  Early ambulation      Alm Barge, PA-C  [place] [facility]           [1]   Allergies  Allergen Reactions    Aspirin       Unknown Reaction during childhood    Tomato     Tylenol  [Acetaminophen ]  Other Adverse Reaction (Add comment)     Fatty liver

## 2023-11-24 NOTE — Nurses Notes (Signed)
7a-7p Nursing shift note. Pt Aox4. Pt rates anxiety at 8/10 and depressions 0/10. Pt denies SI, HI, and A/V/T hallucinations. Pt reports eating well and sleeping well. Pt is medication compliant. Will continue to visually monitor Q15 minutes and prn throughout shift by staff.
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
## 2023-11-24 NOTE — Nurses Notes (Signed)
 NURSE NOTE:  PT REQUESTED TRAZODONE  FOR SLEEP AND WAS GIVEN ATIVAN  1MG  PO FOR CIWA-AR SCORE OF 12 AT 2246.  PT OBSERVED RESTING QUIETLY IN BED WITH EYES CLOSED NO DISTRESS NOTED AT THIS TIME.  PT WILL CONTINUE TO BE MONITORED ON SAFETY ROUNDS AND PRN.

## 2023-11-24 NOTE — Group Note (Signed)
 Group topic:  PROCESS THERAPY    Date of group:  11/24/2023  Start time of group:  1000  End time of group:  1100    Attend:  [x]   Not Attend: []   Attendance:  inpatient attended all of group                 Summary of group discussion:  Importance of Medication Compliance and Building Supportive Relationships    Jennifer [name]  is a 48 y.o. female participating in a process group.    Affect/Mood:  Appropriate    Thought Process:  Logical and Goal directed    Thought Content:  Within normal limits    Interpersonal:  Discussed issues, Attentive, Displayed insight, and Provided feedback    Level of participation:  Full    Comments:    Lonni Edison, [facility]  11/24/2023, 11:04

## 2023-11-24 NOTE — Group Note (Signed)
 Group topic:  PROCESS THERAPY    Date of group:  11/24/2023  Start time of group:  1300  End time of group:  1400    Attend:  [x]   Not Attend: []   Attendance:  inpatient attended all of group                 Summary of group discussion:  Using effective communication in treatment of mental health to build trust and feel safe to share struggles and expediencies using active listening, empathy over sympathy, and using clear and simple language.      Jennifer [name]  is a 48 y.o. female participating in a process group.    Affect/Mood:  Appropriate    Thought Process:  Logical and Goal directed    Thought Content:  Within normal limits    Interpersonal:  Discussed issues and Attentive    Level of participation:  Partial    Comments:    Redell [name] Laine, Licensed Professional Counselor  11/24/2023, 15:14
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
## 2023-11-24 NOTE — Care Plan (Signed)
 CARE PLAN INITIATED      Problem: Adult Behavioral Health Plan of Care  Goal: Plan of Care Review  Outcome: Ongoing (see interventions/notes)  Goal: Patient-Specific Goal (Individualization)  Outcome: Ongoing (see interventions/notes)  Flowsheets (Taken 11/24/2023 0243)  Individualized Care Needs: COPING SKILLS  Anxieties, Fears or Concerns: ANXIETY 9/10  Goal: Adheres to Safety Considerations for Self and Others  Outcome: Ongoing (see interventions/notes)  Intervention: Develop and Maintain Individualized Safety Plan  Recent Flowsheet Documentation  Taken 11/24/2023 0243 by Lucie RAMAN, RN  Safety Measures: safety rounds completed  Goal: Absence of New-Onset Illness or Injury  Outcome: Ongoing (see interventions/notes)  Intervention: Identify and Manage Fall Risk  Recent Flowsheet Documentation  Taken 11/24/2023 0243 by Lucie RAMAN, RN  Safety Promotion/Fall Prevention:   safety round/check completed   nonskid shoes/slippers when out of bed  Intervention: Prevent Infection  Recent Flowsheet Documentation  Taken 11/24/2023 0243 by Lucie RAMAN, RN  Infection Prevention:   rest/sleep promoted   promote handwashing  Goal: Optimized Coping Skills in Response to Life Stressors  Outcome: Ongoing (see interventions/notes)  Goal: Develops/Participates in Therapeutic Alliance to Support Successful Transition  Outcome: Ongoing (see interventions/notes)  Intervention: [facility]  Recent Flowsheet Documentation  Taken 11/24/2023 0235 by Lucie RAMAN, RN  Trust Relationship/Rapport:   care explained   thoughts/feelings acknowledged   questions answered   questions encouraged  Goal: Rounds/Family Conference  Outcome: Ongoing (see interventions/notes)     Problem: Health Knowledge, Opportunity to Enhance (Adult,Obstetrics,Pediatric)  Goal: Knowledgeable about Health Subject/Topic  Description: Patient will demonstrate the desired outcomes by discharge/transition of care.  Outcome: Ongoing (see interventions/notes)      Problem: Suicide Risk  Goal: Absence of Self-Harm  Outcome: Ongoing (see interventions/notes)     Problem: Anxiety Signs/Symptoms  Goal: Optimized Energy Level (Anxiety Signs/Symptoms)  Outcome: Ongoing (see interventions/notes)  Intervention: Optimize Energy Level  Recent Flowsheet Documentation  Taken 11/24/2023 0243 by Lucie RAMAN, RN  Activity (Behavioral Health): up ad lib  Goal: Optimized Cognitive Function (Anxiety Signs/Symptoms)  Outcome: Ongoing (see interventions/notes)  Goal: Improved Mood Symptoms (Anxiety Signs/Symptoms)  Outcome: Ongoing (see interventions/notes)  Goal: Improved Sleep (Anxiety Signs/Symptoms)  Outcome: Ongoing (see interventions/notes)  Goal: Enhanced Social, Occupational or Functional Skills (Anxiety Signs/Symptoms)  Outcome: Ongoing (see interventions/notes)  Intervention: Promote Social, Occupational and Functional Ability  Recent Flowsheet Documentation  Taken 11/24/2023 0235 by Lucie RAMAN, RN  Trust Relationship/Rapport:   care explained   thoughts/feelings acknowledged   questions answered   questions encouraged  Goal: Improved Somatic Symptoms (Anxiety Signs/Symptoms)  Outcome: Ongoing (see interventions/notes)     Problem: Depressive Signs/Symptoms  Goal: Optimized Energy Level (Depressive Signs/Symptoms)  Outcome: Ongoing (see interventions/notes)  Intervention: Optimize Energy Level  Recent Flowsheet Documentation  Taken 11/24/2023 0243 by Lucie RAMAN, RN  Activity (Behavioral Health): up ad lib  Goal: Optimized Cognitive Function (Depressive Signs/Symptoms)  Outcome: Ongoing (see interventions/notes)  Goal: Increased Participation and Engagement (Depressive Signs/Symptoms)  Outcome: Ongoing (see interventions/notes)  Goal: Enhanced Self-Esteem and Confidence (Depressive Signs/Symptoms)  Outcome: Ongoing (see interventions/notes)  Goal: Improved Mood Symptoms (Depressive Signs/Symptoms)  Outcome: Ongoing (see interventions/notes)  Goal: Optimized Nutrition Intake (Depressive  Signs/Symptoms)  Outcome: Ongoing (see interventions/notes)  Goal: Improved Psychomotor Symptoms (Depressive Signs/Symptoms)  Outcome: Ongoing (see interventions/notes)  Intervention: Manage Psychomotor Movement  Recent Flowsheet Documentation  Taken 11/24/2023 0243 by Lucie RAMAN, RN  Activity (Behavioral Health): up ad lib  Goal: Improved Sleep (Depressive Signs/Symptoms)  Outcome: Ongoing (see interventions/notes)  Goal: Enhanced Social, Occupational  or Functional Skills (Depressive Signs/Symptoms)  Outcome: Ongoing (see interventions/notes)

## 2023-11-25 LAB — URINE CULTURE,ROUTINE: URINE CULTURE: >100000 — AB

## 2023-11-25 LAB — POC BLOOD GLUCOSE (RESULTS)
GLUCOSE, POC: 224 mg/dL — ABNORMAL HIGH (ref 70–100)
GLUCOSE, POC: 242 mg/dL — ABNORMAL HIGH (ref 70–100)
GLUCOSE, POC: 246 mg/dL — ABNORMAL HIGH (ref 70–100)
GLUCOSE, POC: 176 mg/dL — ABNORMAL HIGH (ref 70–100)

## 2023-11-25 NOTE — Progress Notes (Signed)
 [facility]    Inpatient Psychiatric Progress Note    Patient's Full Name: Jennifer [name]   Patient's Date of Birth: 04/01/1976   Patient's Age: 48 y.o.   Patient's Legal Sex: female   Patient's MRN: [id number]   Patient's Date of Admission: 11/23/2023   Current Date: 11/25/2023 16:36     Patient's Room/Bed: 203/B     CC:  Bipolar disorder, suicide attempts with suicide ideations, alcohol abuse  History of Present Illness:  6/28-discussed with staff and chart reviewed.  Patient slept fair last night.  Denies nightmares.  Patient with paranoid delusions.  Continues to have suicide ideations.  Endorses depression with anxiety and feelings of hopelessness.  Patient is irritable and argumentative with staff.  Paranoid.  States "no one is helping and on one likes me ".  Compliant with medications.  Patient states that she wants to be discharged in the there is nothing wrong with her at this time.  Poor insight and judgment with high risk of decompensation and self-harm.  Recommend continued inpatient hospitalization for optimization of treatment and discharge planning.      Medications and Allergies:    Current Facility-Administered Medications   Medication Dose Route Frequency    albuterol  90 mcg per inhalation oral inhaler - Nursing to administer  2 Puff Inhalation Q6H PRN    aluminum -magnesium  hydroxide-simethicone  (MAG-AL PLUS) 200-200-20 mg per 5 mL oral liquid  30 mL Oral Q4H PRN    aspirin  chewable tablet 81 mg  81 mg Oral Daily    atorvastatin  (LIPITOR) tablet  40 mg Oral QPM    carvedilol  (COREG ) tablet  12.5 mg Oral 2x/day-Food    cephalexin  (KEFLEX ) capsule  500 mg Oral 4x/day    Correction/SSIP insulin  lispro 100 units/mL injection  3-14 Units Subcutaneous 4x/day AC    dapagliflozin (FARXIGA) tablet  10 mg Oral Daily    dextrose  (GLUTOSE) 40% oral gel  15 g Oral Q15 Min PRN    dextrose  50% (0.5 g/mL) injection - syringe  12.5 g Intravenous Q15 Min PRN    DULoxetine (CYMBALTA)  delayed release capsule  20 mg Oral Daily    [START ON 11/26/2023] ergocalciferol-vitamin D2 (DRISDOL) capsule  50,000 Units Oral Q7 Days    gabapentin  (NEURONTIN ) capsule  300 mg Oral 3x/day    glimepiride (AMARYL) tablet  4 mg Oral 2X/day    glucagon  injection 1 mg  1 mg IntraMUSCULAR Once PRN    hydroCHLOROthiazide  (HYDRODIURIL ) tablet  25 mg Oral Daily    insulin  glargine 100 units/mL injection  15 Units Subcutaneous QPM    LORazepam  (ATIVAN ) tablet  1 mg Oral 2x/day    [START ON 11/26/2023] LORazepam  (ATIVAN ) tablet  0.5 mg Oral 3x/day    [START ON 11/27/2023] LORazepam  (ATIVAN ) tablet  0.5 mg Oral 2x/day    [START ON 11/28/2023] LORazepam  (ATIVAN ) tablet  0.25 mg Oral 3x/day    [START ON 11/29/2023] LORazepam  (ATIVAN ) tablet  0.25 mg Oral 2x/day    [START ON 11/30/2023] LORazepam  (ATIVAN ) tablet  0.25 mg Oral NIGHTLY    losartan (COZAAR) tablet  50 mg Oral Daily    magnesium  hydroxide (MILK OF MAGNESIA) 400mg  per 5mL oral liquid  30 mL Oral HS PRN    omeprazole (PRILOSEC) capsule  40 mg Oral Daily    ranolazine  (RANEXA ) extended release tablet  500 mg Oral 2x/day    thyroid  (ARMOUR THYROID ) tablet  180 mg Oral Daily    tiotropium bromide (SPIRIVA  RESPIMAT)  2.5 mcg per inhalation oral inhaler - Nursing to administer  2 Puff Inhalation Daily        Allergies[1]       Vital Signs:    Filed Vitals:    11/24/23 0700 11/24/23 1711 11/24/23 2000 11/25/23 0700   BP: 120/70 121/70 (!) 120/57 131/73   Pulse: 83 96 86 89   Resp: 16  18 20    Temp: 36.7 C (98 F)  36.7 C (98.1 F) 36.2 C (97.1 F)   SpO2: 98%  98% 99%        Labs:    Results for orders placed or performed during the hospital encounter of 11/23/23 (from the past 24 hours)   POC BLOOD GLUCOSE (RESULTS)    Collection Time: 11/24/23  7:50 PM   Result Value Ref Range    GLUCOSE, POC 165 (H) 70 - 100 mg/dl   POC BLOOD GLUCOSE (RESULTS)    Collection Time: 11/25/23  7:09 AM   Result Value Ref Range    GLUCOSE, POC 224 (H) 70 - 100 mg/dl   POC BLOOD GLUCOSE (RESULTS)     Collection Time: 11/25/23 10:43 AM   Result Value Ref Range    GLUCOSE, POC 242 (H) 70 - 100 mg/dl   POC BLOOD GLUCOSE (RESULTS)    Collection Time: 11/25/23  3:55 PM   Result Value Ref Range    GLUCOSE, POC 246 (H) 70 - 100 mg/dl        Mental Status Examination:  Review of Systems:  10 systems reviewed and negative except as noted in HPI    Sensorium/Alertness:    Alert   Awake       Orientation:   Person, Place,      Appearance:   Appears stated age,    Disheveled,       Psychomotor  Activity:   Agitation,      Abnormal Behaviors:  None,       Attitude Towards Examiner:     Evasive,     Eye Contact:    Poor    Speech:      Pressured,   Rambling       Mood:  Anxious,   Depressed,     Irritable,       Affect:        Irritable,    Labile,        Hallucinations:   Denies,     Though Process:  Circumstantial,       Suicidal ideations:      Recent SI.       Homicidal ideations:   Denies HI          Thought Content:    Paranoid delusions        Impulse Control:  Impaired,        Concentration/Calculation/Attention Span:       Impaired    How was the patient's Concentration/Calculation/Attention tested/assessed? Per observation and interview with patient     Recent Memory:    Impaired    Remote Memory:   WNL,      How was the patient's Remote Memory Tested/Assessed?  Past Events, as it relates to history    Intelligence/Fund of Knowledge: Average    How was the patient's Intelligence/Fund of Knowledge Tested/Assessed? Based on history, Based on vocabulary, syntax, grammar, and content    Judgement:       Impaired/Poor    How was the patient's Judgement Tested/Assessed? Per patient's behavior/history of present illness  Insight:     Impaired/Poor    How was the patient's Insight Tested/Assessed? Understanding of severity of illness/history of present illness       Diagnoses:   F31.63 Bipolar disorder, current episode mixed, severe, without psychotic features  R45.851 suicidal ideation   F10.14 Alcohol abuse with  alcohol-induced mood disorder  Z59. 0 Homelessness      Treatment and Medication Recommendation:  -The patient is admitted for safety and stabilization.  Medicines will be instituted and adjusted as indicated.  We have discussed medicine side-effects, treatment options, and pregnancy precautions if applicable.  The patient agrees with the plan and will be encouraged to be active in groups and milieu treatment. Patient has been instructed of the unit rules and regulations and will be seen by the hospitalist for medical issues. Further changes will be dictated by clinical course.     I estimate patient length of stay will be 7-10 days    -behavioral health safety checks  Provisional discharge disposition:     Meds/Orders :  6/28-continue current medications and plan of care.  Medications recently adjusted.  Monitor for effectiveness  11/24/23  Peer recovery coach consult for alcohol abuse and THC  Start Ativan  taper for alcohol withdrawal   Start Invega 3 mg Po daily for bipolar   She is on Keflex  for UTI, medical will review   Decrease Gabapentin  from 800 mg TID to 300 mg TID for 4 days then discontinue. Gabapentin  is not allowed in rehab. Patient has alcohol abuse.   Discontinue Vistaril 25 mg q 8 hrs PRN anxiety. Patient does not want Vistaril.   Decrease Cymbalta from 30 mg daily to 20 mg daily for depression, she is bipolar   Do not continue with acarbose, currently drinking heavily   Discontinue Abilify   Discontinue PRN Trazodone  due to bipolar disorder      I certify that the inpatient psychiatric admission continues to be medically necessary for (1) treatment which is reasonably expected to improve the patient's condition, and/or (2) for diagnostic studies    Interaction Attestation: Clinical telemedicine services delivered using HIPAA-compliant interactive video-audio telecommunications while the patient and the rendering provider were not in the same physical location. Patient agreeable to  telecommunication.    TELEMEDICINE DOCUMENTATION:    Patient Location:  [facility], [address], [place], [place] [address]  Provider Location: Remote  Patient/family aware of provider location:  yes  Patient/family consent for telemedicine:  yes  Examination observed and performed by:  Zachary Francis Novak, PMHNP      Zachary Francis Novak, PMHNP           [1]   Allergies  Allergen Reactions    Aspirin       Unknown Reaction during childhood    Tomato     Tylenol  [Acetaminophen ]  Other Adverse Reaction (Add comment)     Fatty liver

## 2023-11-25 NOTE — Progress Notes (Signed)
 The nurse in charge requested that the counselor speak with a patient named Jennifer [name], who was upset and refusing to eat her meal. The counselor used active listening and motivational interviewing techniques to engage with Jennifer [name] for 15 to 20 minutes. During this time, Jennifer [name] expressed that life had been challenging; she was unable to pay her electric bill and was facing eviction in the near future.    Jennifer [name] ate her meal in the counselor's office, and the nurse assisted by providing her with a second sandwich and juice, as she had requested. However, Jennifer [name] did not eat the second sandwich and asked if she could have it later.    During the conversation, Jennifer [name] requested to call her daughter to ask her to bring toiletry items that wouldn't trigger her skin allergy. The counselor explained the hospital's rules regarding phone calls and visitation. The counselor then called Jennifer [name]'s daughter, relayed her message, and confirmed the visitation details with the nurse, who stated that Jennifer [name] could have visitors.

## 2023-11-25 NOTE — Care Plan (Signed)
 PT IS CALM AND COOPERATIVE, COMPLIANT WITH MEDS AND TREATMENTS       Problem: Adult Behavioral Health Plan of Care  Goal: Plan of Care Review  Outcome: Ongoing (see interventions/notes)     Problem: Adult Behavioral Health Plan of Care  Goal: Patient-Specific Goal (Individualization)  Outcome: Ongoing (see interventions/notes)     Problem: Adult Behavioral Health Plan of Care  Goal: Adheres to Safety Considerations for Self and Others  Outcome: Ongoing (see interventions/notes)

## 2023-11-25 NOTE — Group Note (Signed)
 Group topic:  EDUCATION GROUP    Date of group:  11/25/2023  Start time of group:  1530  End time of group:  1600    Attend:  []   Not Attend:  [x]   Attendance:  inpatient attended 0 minutes               Summary of group discussion: Addiction stages Q&A      Jennifer [name]  is a 48 y.o. female participating in a experiential/activity group.    Patient's mental status/affect:    Patient's behavior:    Patient's response:    Augustin Merritt, RN  11/25/2023, 16:12

## 2023-11-25 NOTE — Group Note (Signed)
 Group topic:  COMMUNITY MEETING GROUP    Date of group:  11/25/2023  Start time of group:  1130  End time of group:  1148    Attend:  []   Not attend: [x]   Attendance:  inpatient attended 00 minutes                 Summary of group discussion:Community Meeting      Jennifer [name]  is a 48 y.o. female participating in a community meeting group.    Patient observations:      Patient goals:      [name], PAT CARE [facility]  11/25/2023, 11:49

## 2023-11-25 NOTE — Nurses Notes (Signed)
 7a-7p Nursing shift note. Pt Aox4. Pt rates anxiety at 10/10 and depressions 10/10. Pt denies SI, HI, and A/V/T hallucinations. Pt reports eating well and sleeping well. Pt is medication compliant. Will continue to visually monitor Q15 minutes and prn throughout shift by staff.

## 2023-11-25 NOTE — Nurses Notes (Signed)
 Patient brought her belongings up to the nurses station, states to staff, you may as well call the police and have them here because the are hauling my ass out of here today.  States that we have violated her rights, discriminated against her and questioned why she is on the adult unit and not the geriatric unit because she is no different.  States she is going to need to put a diaper on to go to the geriatric unit.  States she has her Uncle the attorney working on things for discriminating and violating her rights.  States we are trying to poison her  because dietary sent a sandwich up with tomato on it and she states she is allergic to tomato.  Staff immediately contacted dietary and new complete lunch tray sent up with sandwich without tomato.    that we won't allow visitors to the patients and that's against all human rights.  Asking for my name and another nurse name and writing them down on a piece of paper as evidence and we are both assholes and have attitudes.  Pt requested to sign ITL, and when nurse was explaining to her about it, she began to talk over her, but then refused to sign it.  She is demanding to talk to her counselor and states that I need to call her so she can speak with her.  Explained that her assigned counselor is off for the weekend but arranged for weekend counselor to speak with patient.  She states that we don't care about anything except sitting back behind the desk and smiling.  States she's worried about her heart and not getting heart medicines, but she is receiving cardiac meds.  Patient will not allow staff to explain things to her entirely as she cuts us  off talking over us  and changing subject to complain about something entirely different.

## 2023-11-25 NOTE — Nurses Notes (Signed)
 Patient was concerned about medications, asking about Lasix , and was wanting someone to check home meds again. She stated she has CHF, I am just voice concern for patient.

## 2023-11-25 NOTE — Nurses Notes (Signed)
 7p-7a Nursing shift note. Pt Aox4. Pt rates anxiety at 9/10 and depressions 7/10. Pt denies SI, HI, and A/V/T hallucinations. Pt reports eating ok and sleeping somewhat ok. Pt is medication compliant. Will continue to visually monitor Q15 minutes and prn throughout shift by staff. Pt is upset with staff on dayshift, states they did not acknowledge her feelings or listen to her when she tried to express herself or her concerns or ask questions. Pt asked to listen to spiritual music on television early in the morning when dayroom opened up before any other pts were in there and she was told no per the pt. Pt asks if she can have church time in the morning since it is Sunday I informed her I can let the charge nurse know and see if they can do that tomorrow that it is up to them since this shift is not there at that time. Pt also states she is upset about about some of her medications, and instructed her to speak with the providers when they round, pt voiced understanding. Pt is concerned about her home situation, states that she does not have electricity and is concerned about that. Pt states she also feels she needs help outside of here for her alcoholism that she constantly thinks about drinking since that is the only thing she knows and it numbs her thoughts about all her other problems and she knows she should not do drink and it is bad for her. Pt asks that her records be sent to [place] so they know what meds she was on here, I informed her she can sign an ROI before she leaves so that [place] can request her records or they can send them when she is released from here. Pt voiced understanding and thanked me.

## 2023-11-25 NOTE — Nurses Notes (Signed)
 Shift note. Patient is lying in bed resting. Awakened and stated I don't belong here with these people, she was very upset and anxious.  Patient is anxious but cooperative. PO medication taken and tolerated well. She did state she doesn't want to take trazodone  again, it made her feel Bad Patient denies SI/HI, AV Hallucinations at this time. Patient rates depression and anxiety at 10/10 . Patient reports appetite is not that good, she was offered a snack but refused it and she stated I'm gonna starve to death.  She stated I would be able to sleep better if they could get my medicines correct. She was informed that she has to be patient and work with the providers to get everything situated. No distress or pain observed. Plan of care ongoing with visual safety checks Q 15 mins and PRN by staff

## 2023-11-25 NOTE — Nurses Notes (Addendum)
 Pt at nursing station agitated and stating that her rights are being violated because she is currently on no visitor status. This nurse explained that it was up to the provider to change the visitation status. Pt argumentative, and demanding. Pt requesting to speak to counselor Sari today. This nurse explained that Sari would return on Monday and that there are weekend counselors that will offer group therapy. Pt stated, she told me to call her and she would talk to me regarding regular counselor, Sari. Pt again stated that her rights were being violated.     Pt refusing insulin  at this time and stated to nurse Augustin Merritt that she hoped her sugar goes up over a thousand.     1200 This nurse spoke to weekend counselor, Belvin Officer, and requested she meet with patient, if possible. Counselor will meet with patient soon.

## 2023-11-26 DIAGNOSIS — I509 Heart failure, unspecified: Secondary | ICD-10-CM

## 2023-11-26 LAB — POC BLOOD GLUCOSE (RESULTS)
GLUCOSE, POC: 191 mg/dL — ABNORMAL HIGH (ref 70–100)
GLUCOSE, POC: 372 mg/dL — ABNORMAL HIGH (ref 70–100)
GLUCOSE, POC: 260 mg/dL — ABNORMAL HIGH (ref 70–100)
GLUCOSE, POC: 182 mg/dL — ABNORMAL HIGH (ref 70–100)

## 2023-11-26 MED ORDER — FUROSEMIDE 20 MG TABLET
20.0000 mg | ORAL_TABLET | Freq: Every day | ORAL | Status: DC
Start: 2023-11-26 — End: 2023-12-01
  Administered 2023-11-26 – 2023-12-01 (×6): 20 mg via ORAL
  Filled 2023-11-26 (×6): qty 1

## 2023-11-26 MED ORDER — IBUPROFEN 600 MG TABLET
600.0000 mg | ORAL_TABLET | Freq: Four times a day (QID) | ORAL | Status: DC | PRN
Start: 2023-11-26 — End: 2023-12-01
  Administered 2023-11-26 – 2023-11-30 (×5): 600 mg via ORAL
  Filled 2023-11-26 (×6): qty 1

## 2023-11-26 MED ORDER — IBUPROFEN 600 MG TABLET
600.0000 mg | ORAL_TABLET | Freq: Once | ORAL | Status: AC
Start: 2023-11-26 — End: 2023-11-26
  Administered 2023-11-26: 600 mg via ORAL
  Filled 2023-11-26: qty 1

## 2023-11-26 NOTE — Care Plan (Signed)
 PT IS CALM AND COOPERATIVE, COMPLIANT WITH MEDS AND TREATMENTS       Problem: Adult Behavioral Health Plan of Care  Goal: Plan of Care Review  Outcome: Ongoing (see interventions/notes)     Problem: Adult Behavioral Health Plan of Care  Goal: Patient-Specific Goal (Individualization)  Outcome: Ongoing (see interventions/notes)     Problem: Adult Behavioral Health Plan of Care  Goal: Adheres to Safety Considerations for Self and Others  Outcome: Ongoing (see interventions/notes)

## 2023-11-26 NOTE — Group Note (Signed)
 [facility] MEDICINE [facility], ADULT BEHAVIORAL MEDICINE  [address]  [place] [address]    Group Note             Name: Jennifer [name]   Date of Birth: 1976-01-17   Today's Date: 11/26/2023   Group Start Time: 1200   Group End Time: 1300   Group Topic: RECREATION GROUP  Number of participants: 10      Summary of group discussion:   Outdoor group games to promote appropriate other interaction abnd decrease potential self imposed isolation tendencies.    [name]'s Participation and Response: As a spectator only.      Suicidal/Homicidal Risk:  Currently denies SI/HI and expresses willingness to contact crisis services if needed.

## 2023-11-26 NOTE — Group Note (Signed)
 [facility] MEDICINE [facility], ADULT BEHAVIORAL MEDICINE  [address]  [place] [address]    Group Note             Name: Jennifer [name]   Date of Birth: 1976/02/13   Today's Date: 11/23/2023   Group Start Time: 1200   Group End Time: 1300   Group Topic: RECREATION GROUP  Number of participants: 9      Summary of group discussion:   Outdoor group games to promote appropriate other interaction and decrease potential self isolation tendencies.    [name]'s Participation and Response: Pt did not attend.      Suicidal/Homicidal Risk:  Currently denies SI/HI and expresses willingness to contact crisis services if needed.

## 2023-11-26 NOTE — Nurses Notes (Signed)
 Administered PRN albuterol  90 mcg per patient request for dyspnea.    Follow-up: No signs of dyspnea are reported.

## 2023-11-26 NOTE — Group Note (Signed)
 [facility] MEDICINE [facility], ADULT BEHAVIORAL MEDICINE  [address]  [place] [address]    Group Note             Name: Jennifer [name]   Date of Birth: May 03, 1976   Today's Date: 11/26/2023   Group Start Time: 1300   Group End Time: 1355   Group Topic: ANGER MANAGEMENT  Number of participants: 11      Summary of group discussion:       Anger management focuses on helping individuals understand, manage, and constructively express their anger.  developing healthy coping mechanisms and communication skills to respond to anger-provoking situations in a way that minimizes harm and promotes well-being.      [name]'s Participation and Response: Did not attend       Suicidal/Homicidal Risk:  Currently denies SI/HI and expresses willingness to contact crisis services if needed.

## 2023-11-26 NOTE — Nurses Notes (Signed)
 7A-7P NOTE: Patient is AOX4 and cooperative with care this shift. She is compliant with medication and assessment. She is up ad-lib around the unit, steady gait noted. She rates anxiety a 6/10 on a scale of 1-10, with 10 being the worst. She denies depression, SI/HI and A/V/T hallucinations. No reports of pain or any other concerns voiced at this time. Skin intact. Admits to good rest and appetite. Will continue to monitor q15 mins per protocol and as needed.

## 2023-11-26 NOTE — Group Note (Signed)
 Group topic:  EDUCATION GROUP    Date of group:  11/26/2023  Start time of group:  1145  End time of group:  1215    Attend:  [x]   Not Attend:  []   Attendance:  inpatient attended all of group               Summary of group discussion: Nursing group. Substance Abuse/Addiction      Jennifer [name]  is a 48 y.o. female participating in a experiential/activity group.    Patient's mental status/affect:    Patient's behavior:    Patient's response:    [name], LPN  3/70/7974, 12:19

## 2023-11-26 NOTE — Behavioral Health (Signed)
 CPRSS spoke with patient about any and all substance use and tx options available.     Patient states she drinks alcohol daily and has been doing so for the past 3 years. Patient stated she will drink wine daily, as long as she can afford it. Patient stated she is going through some things, that is causing her to drink more. Patient stated she is about to be evicted from her home. Patient stated her last drink was 1:30 am on 11/23/23. Patient stated she knows she needs to straighten up and stop drinking and that's what she plans to do. Patient and myself discussed AA/NA meetings in the area, as well as, sponsorship.     Patient declined any tx at this time.     Harm reduction, safe alcohol guidelines, and long-term affects were discussed.

## 2023-11-26 NOTE — Group Note (Signed)
 [facility] MEDICINE [facility], ADULT BEHAVIORAL MEDICINE  [address]  [place] [address]    Group Note             Name: Jennifer [name]   Date of Birth: [birth year]   Today's Date: 11/25/2023   Group Start Time: 1700   Group End Time: 1745   Group Topic: ACTIVITY GROUP  Number of participants: 8      Summary of group discussion:   Recreation/leisure activity options pursuit game.    [name]'s Participation and Response: Pt did not attend.      Suicidal/Homicidal Risk:  Currently denies SI/HI and expresses willingness to contact crisis services if needed.

## 2023-11-26 NOTE — Group Note (Signed)
 [facility] MEDICINE [facility], ADULT BEHAVIORAL MEDICINE  [address]  [place] [address]    Group Note             Name: Jennifer [name]   Date of Birth: 17-Feb-1976   Today's Date: 11/25/2023   Group Start Time: 1200   Group End Time: 1300   Group Topic: RECREATION GROUP  Number of participants: 9      Summary of group discussion:   Outdoor group games to promote appropriate other interaction and decrease self imposed isolation tendencies.    [name]'s Participation and Response: Pt readily engaged with others.      Suicidal/Homicidal Risk:  Currently denies SI/HI and expresses willingness to contact crisis services if needed.

## 2023-11-26 NOTE — Progress Notes (Signed)
 [facility]   BH CONSULT PROGRESS NOTE      Sheronica, Corey A  Date of Admission:  11/23/2023  Date of Birth:  01-Jun-1975  Date of Service:  11/26/2023    Hospital Day:  LOS: 3 days         HPI: Patient reports a history of congestive heart failure. Her main complaint today is that she has not been receiving her home dose of Lasix  20 mg. She says that she takes it along with HCTZ and has been for some time. She denies any lower extremity edema. She denies any adverse effects with her medication. No hyponatremia, hypokalemia, or AKI/CKD on labs drawn 11/23/23.      Review of Systems:  As per HPI.    Physical Examination  General: Patient is alert and oriented. No acute distress.  Head: Normocephalic and atraumatic.    Nose: Nasal passages clear. Mucosa moist.     Neck: Supple.   Heart: Regular rate and rhythm. S1 & S2 present. No S3 or S4. No rubs, gallops, or murmurs appreciated.      Lungs: Clear to auscultation bilaterally with no wheezes or rales.  No respiratory distress noted.   Abdomen: Soft, nontender, nondistended abdomen.    Extremities: No edema, cyanosis, or clubbing. Grossly moves all extremities.    Skin: Warm and dry without lesions.             Vital Signs:  Temp (24hrs) Max:36.8 C (98.2 F)      Temperature: 36.7 C (98 F)  BP (Non-Invasive): 131/76  Heart Rate: 84  Respiratory Rate: 20  SpO2: 98 %    Current Medications:  albuterol  90 mcg per inhalation oral inhaler - Nursing to administer, 2 Puff, Inhalation, Q6H PRN  aluminum -magnesium  hydroxide-simethicone  (MAG-AL PLUS) 200-200-20 mg per 5 mL oral liquid, 30 mL, Oral, Q4H PRN  aspirin  chewable tablet 81 mg, 81 mg, Oral, Daily  atorvastatin  (LIPITOR) tablet, 40 mg, Oral, QPM  carvedilol  (COREG ) tablet, 12.5 mg, Oral, 2x/day-Food  cephalexin  (KEFLEX ) capsule, 500 mg, Oral, 4x/day  Correction/SSIP insulin  lispro 100 units/mL injection, 3-14 Units, Subcutaneous, 4x/day AC  dapagliflozin (FARXIGA) tablet, 10 mg, Oral,  Daily  dextrose  (GLUTOSE) 40% oral gel, 15 g, Oral, Q15 Min PRN  dextrose  50% (0.5 g/mL) injection - syringe, 12.5 g, Intravenous, Q15 Min PRN  DULoxetine (CYMBALTA) delayed release capsule, 20 mg, Oral, Daily  ergocalciferol-vitamin D2 (DRISDOL) capsule, 50,000 Units, Oral, Q7 Days  gabapentin  (NEURONTIN ) capsule, 300 mg, Oral, 3x/day  glimepiride (AMARYL) tablet, 4 mg, Oral, 2X/day  glucagon  injection 1 mg, 1 mg, IntraMUSCULAR, Once PRN  hydroCHLOROthiazide  (HYDRODIURIL ) tablet, 25 mg, Oral, Daily  insulin  glargine 100 units/mL injection, 15 Units, Subcutaneous, QPM  LORazepam  (ATIVAN ) tablet, 0.5 mg, Oral, 3x/day  [START ON 11/27/2023] LORazepam  (ATIVAN ) tablet, 0.5 mg, Oral, 2x/day  [START ON 11/28/2023] LORazepam  (ATIVAN ) tablet, 0.25 mg, Oral, 3x/day  [START ON 11/29/2023] LORazepam  (ATIVAN ) tablet, 0.25 mg, Oral, 2x/day  [START ON 11/30/2023] LORazepam  (ATIVAN ) tablet, 0.25 mg, Oral, NIGHTLY  losartan (COZAAR) tablet, 50 mg, Oral, Daily  magnesium  hydroxide (MILK OF MAGNESIA) 400mg  per 5mL oral liquid, 30 mL, Oral, HS PRN  omeprazole (PRILOSEC) capsule, 40 mg, Oral, Daily  ranolazine  (RANEXA ) extended release tablet, 500 mg, Oral, 2x/day  thyroid  (ARMOUR THYROID ) tablet, 180 mg, Oral, Daily  tiotropium bromide (SPIRIVA  RESPIMAT) 2.5 mcg per inhalation oral inhaler - Nursing to administer, 2 Puff, Inhalation, Daily        Current Orders:  Active Orders   Diet    DIET DIABETIC Calorie amount: CC 2400-2600; Do you want to initiate MNT Protocol? Yes; Additional modifications/limitations: DOUBLE ENTREE, DOUBLE MEATS     Frequency: All Meals     Number of Occurrences: 1 Occurrences   Nursing    BEHAVIORAL MED SAFETY CHECKS     Frequency: Q15MIN     Number of Occurrences: Until Specified    ENCOURAGE AMBULATION AND ROM EXERCISES     Frequency: UNTIL DISCONTINUED     Number of Occurrences: Until Specified    HYPOGLYCEMIA MANAGEMENT - CONSCIOUS PATIENT W/DIET ORDER     Frequency: UNTIL DISCONTINUED     Number of  Occurrences: Until Specified    HYPOGLYCEMIA MANAGEMENT - UNCONSCIOUS/ALTERED/NPO PATIENT     Frequency: UNTIL DISCONTINUED     Number of Occurrences: Until Specified    HYPOGLYCEMIA TREATMENT ALGORITHM     Frequency: UNTIL DISCONTINUED     Number of Occurrences: Until Specified    MISCELLANEOUS MD/DO TO NURSE     Frequency: UNTIL DISCONTINUED     Number of Occurrences: Until Specified     Order Comments: Patient can take her Prilosec from home.  She states she brought it with her I have discontinue Protonix .  Patient states it works better for her      PT IS LOW RISK FOR VENOUS THROMBOEMBOLISM     Frequency: CONTINUOUS     Number of Occurrences: Until Specified    VITAL SIGNS  EVERY 12 HRS     Frequency: EVERY 12 HRS     Number of Occurrences: Until Specified   Code Status    FULL CODE: ATTEMPT RESUSCITATION / CPR     Frequency: CONTINUOUS     Number of Occurrences: Until Specified     Order Comments: Patient wishes for full ICU level care including advanced airway interventions / mechanical ventilation.     In the event of pulseless cardiac arrest, patient consents to ACLS (advanced cardiac life support) to attempt resuscitation.  IE - Consents to chest compressions, life support including intubation, mechanical ventilation, defibrillation/cardioversion as indicated.         Consult    IP CONSULT TO HOSPITALIST Requested Provider; SHELTON BOOKMAN     Frequency: ONE TIME     Number of Occurrences: 1 Occurrences    IP CONSULT TO PEER RECOVERY COACH     Frequency: ONE TIME     Number of Occurrences: 1 Occurrences   Behavioral Health Services    CIWA-AR ASSESSMENT     Frequency: Q4H     Number of Occurrences: Until Specified   Point of Care Testing    PERFORM POC WHOLE BLOOD GLUCOSE     Frequency: TID AC & HS     Number of Occurrences: Until Specified   Precaution    BMED SPECIAL PRECAUTIONS OBSERVE EVERY FIFTEEN MINUTES     Frequency: UNTIL DISCONTINUED     Number of Occurrences: Until Specified    SUICIDE  PRECAUTIONS - MODERATE RISK     Frequency: UNTIL DISCONTINUED     Number of Occurrences: Until Specified     Order Comments: ASQ Screening Tool:    MODERATE RISK PRECAUTIONS: Patients who screen positive for any or all ASQ questions 1-4 but NOT question 5 -- Nurse will implement Enhanced Safety Checks on patient with hourly rounding.     MODERATE AND HIGH RISK PRECAUTIONS: For all patients who have a positive suicide risk screening, the nurse will inform the primary  team of the patient's positive screening. For all patients who have a positive suicide risk screening, the nurse will place an order for a SUICIDE RISK ASSESMENT which should be completed by a LIP / Clinical Therapist to fully assess a patient's risk. Any additional interventions for safety will be implemented based on the patient's risk assessment and further determined by the licensed LIP / Clinical Therapist. Suicide prevention information and resources will be provided to patients who are found to be at risk for suicide upon discharge.    Policy Reference: Please follow your local facility policy for any deviations from this process.    ..SABRA   Privilege Level    PERMITTED VISITORS     Frequency: UNTIL DISCONTINUED     Number of Occurrences: Until Specified   Medications    albuterol  90 mcg per inhalation oral inhaler - Nursing to administer     Frequency: Q6H PRN     Dose: 2 Puff     Route: Inhalation    aluminum -magnesium  hydroxide-simethicone  (MAG-AL PLUS) 200-200-20 mg per 5 mL oral liquid     Frequency: Q4H PRN     Dose: 30 mL     Route: Oral    aspirin  chewable tablet 81 mg     Frequency: Daily     Dose: 81 mg     Route: Oral    atorvastatin  (LIPITOR) tablet     Frequency: QPM     Dose: 40 mg     Route: Oral    carvedilol  (COREG ) tablet     Frequency: 2x/day-Food     Dose: 12.5 mg     Route: Oral    cephalexin  (KEFLEX ) capsule     Frequency: 4x/day     Dose: 500 mg     Route: Oral    Correction/SSIP insulin  lispro 100 units/mL injection      Frequency: 4x/day AC     Dose: 3-14 Units     Route: Subcutaneous    dapagliflozin (FARXIGA) tablet     Frequency: Daily     Dose: 10 mg     Route: Oral    dextrose  (GLUTOSE) 40% oral gel     Frequency: Q15 Min PRN     Dose: 15 g     Route: Oral    dextrose  50% (0.5 g/mL) injection - syringe     Frequency: Q15 Min PRN     Dose: 12.5 g     Route: Intravenous    DULoxetine (CYMBALTA) delayed release capsule     Frequency: Daily     Dose: 20 mg     Route: Oral    ergocalciferol-vitamin D2 (DRISDOL) capsule     Frequency: Q7 Days     Dose: 50,000 Units     Route: Oral    gabapentin  (NEURONTIN ) capsule     Frequency: 3x/day     Dose: 300 mg     Route: Oral    glimepiride (AMARYL) tablet     Frequency: 2X/day     Dose: 4 mg     Route: Oral    glucagon  injection 1 mg     Frequency: Once PRN     Dose: 1 mg     Route: IntraMUSCULAR    hydroCHLOROthiazide  (HYDRODIURIL ) tablet     Frequency: Daily     Dose: 25 mg     Route: Oral    insulin  glargine 100 units/mL injection     Frequency: QPM     Dose: 15 Units  Route: Subcutaneous    LORazepam  (ATIVAN ) tablet     Frequency: 3x/day     Dose: 0.5 mg     Route: Oral    LORazepam  (ATIVAN ) tablet     Frequency: 2x/day     Dose: 0.5 mg     Route: Oral    LORazepam  (ATIVAN ) tablet     Frequency: 3x/day     Dose: 0.25 mg     Route: Oral    LORazepam  (ATIVAN ) tablet     Frequency: 2x/day     Dose: 0.25 mg     Route: Oral    LORazepam  (ATIVAN ) tablet     Frequency: NIGHTLY     Dose: 0.25 mg     Route: Oral    losartan (COZAAR) tablet     Frequency: Daily     Dose: 50 mg     Route: Oral    magnesium  hydroxide (MILK OF MAGNESIA) 400mg  per 5mL oral liquid     Frequency: HS PRN     Dose: 30 mL     Route: Oral    omeprazole (PRILOSEC) capsule     Frequency: Daily     Dose: 40 mg     Route: Oral    ranolazine  (RANEXA ) extended release tablet     Frequency: 2x/day     Dose: 500 mg     Route: Oral    thyroid  (ARMOUR THYROID ) tablet     Frequency: Daily     Dose: 180 mg     Route: Oral     tiotropium bromide (SPIRIVA  RESPIMAT) 2.5 mcg per inhalation oral inhaler - Nursing to administer     Frequency: Daily     Dose: 2 Puff     Route: Inhalation          I/O:  I/O last 24 hours:  No intake or output data in the 24 hours ending 11/26/23 1200  I/O current shift:  No intake/output data recorded.      Labs (Please indicate ordered or reviewed)  Reviewed: Lab Results Today:   Results for orders placed or performed during the hospital encounter of 11/23/23 (from the past 24 hours)   POC BLOOD GLUCOSE (RESULTS)   Result Value Ref Range    GLUCOSE, POC 246 (H) 70 - 100 mg/dl   POC BLOOD GLUCOSE (RESULTS)   Result Value Ref Range    GLUCOSE, POC 176 (H) 70 - 100 mg/dl   POC BLOOD GLUCOSE (RESULTS)   Result Value Ref Range    GLUCOSE, POC 191 (H) 70 - 100 mg/dl   POC BLOOD GLUCOSE (RESULTS)   Result Value Ref Range    GLUCOSE, POC 372 (H) 70 - 100 mg/dl       Radiology Tests (Please indicate ordered or reviewed)  Reviewed: No results found.    Problem List:  Active Hospital Problems   (*Primary Problem)    Diagnosis    *Suicidal ideation         Assessment/ Plan: Congestive heart failure. Will resume home dose of Lasix  and monitor for hypotension. Will continue to follow as needed.     DVT/PE Prophylaxis: ambulation    Alm FORBES Birch, PA-C

## 2023-11-26 NOTE — Group Note (Signed)
 Group topic:  COMMUNITY MEETING GROUP    Date of group:  11/26/2023  Start time of group:  0800  End time of group:  0830    Attend:  []   Not attend: [x]   Attendance:  inpatient attended 0 minutes                 Summary of group discussion: COMMUNITY MEETING. QUESTIONS ENCOURAGED AND ANSWERED.      Jennifer [name]  is a 48 y.o. female participating in a community meeting group.    Patient observations:      Patient goals:      Venetia Chester, RN  11/26/2023, 08:34

## 2023-11-27 LAB — POC BLOOD GLUCOSE (RESULTS)
GLUCOSE, POC: 187 mg/dL — ABNORMAL HIGH (ref 70–100)
GLUCOSE, POC: 278 mg/dL — ABNORMAL HIGH (ref 70–100)
GLUCOSE, POC: 231 mg/dL — ABNORMAL HIGH (ref 70–100)
GLUCOSE, POC: 487 mg/dL — ABNORMAL HIGH (ref 70–100)

## 2023-11-27 MED ORDER — OLANZAPINE 5 MG DISINTEGRATING TABLET
10.0000 mg | ORAL_TABLET | Freq: Once | ORAL | Status: AC
Start: 2023-11-27 — End: 2023-11-27
  Administered 2023-11-27: 10 mg via ORAL
  Filled 2023-11-27: qty 2

## 2023-11-27 MED ORDER — PALIPERIDONE ER 3 MG TABLET,EXTENDED RELEASE 24 HR
3.0000 mg | EXTENDED_RELEASE_TABLET | Freq: Every day | ORAL | Status: DC
Start: 2023-11-27 — End: 2023-11-28
  Administered 2023-11-27: 3 mg via ORAL
  Filled 2023-11-27 (×2): qty 1

## 2023-11-27 NOTE — Behavioral Health (Signed)
 The counselor whenever to meet with the patient today and she was in the process of being moved to the pick unit from the adult unit.    The patient was going off and yelling and even took the phone away from another patient to make a phone call.    The counselor will try to meet with the patient again at a later date and time.

## 2023-11-27 NOTE — Progress Notes (Signed)
 [facility]    Inpatient Psychiatric Progress Note    Patient's Full Name: Jennifer [name]   Patient's Date of Birth: Jun 29, 1975   Patient's Age: 48 y.o.   Patient's Legal Sex: female   Patient's MRN: [id number]   Patient's Date of Admission: 11/23/2023   Current Date: 11/27/2023 08:20     Patient's Room/Bed: 203/B       History of Present Illness:48 year old with bipolar disorder (diagnosed at age 89 years old per patient), multiple suicide attempts in the past per patient (she states a lot.. first one when I was 16...  I took a bunch of pills), multiple psychiatric hospitalizations and alcohol abuse (currently drinking half a gallon a day). Admitted for suicide ideation.   She is labile. She says she is mad because she was told she would only be here for 3 days. She says she did not say she was suicidal. She says she told someone she was hopeless. She rates depression and anxiety 0/10. SABRAPatient denies SI, HI and AVH. She say she is not interested in rehab.   Patient became agitated when she was informed she is not discharging today.   She remains functionally impaired as evidenced by her diminished coping skills and inability to deal with day-to-day living and stressors. Move patient to PICU.   Case discussed with Lolita Rosser, RN, BSN   Review of Systems   Constitutional:  Denies pain.   HENT: Negative.     Eyes: Negative.    Respiratory: Negative.     Cardiovascular: Negative.    Gastrointestinal: Negative.    Genitourinary: Negative.    Musculoskeletal:  denies  myalgias.   Skin: Negative.    Neurological: Negative.    Psychiatric/Behavioral:  Positive for depression, anxiety,agitation       Medications and Allergies:    Current Facility-Administered Medications   Medication Dose Route Frequency    albuterol  90 mcg per inhalation oral inhaler - Nursing to administer  2 Puff Inhalation Q6H PRN    aluminum -magnesium  hydroxide-simethicone  (MAG-AL PLUS) 200-200-20 mg per 5 mL oral  liquid  30 mL Oral Q4H PRN    aspirin  chewable tablet 81 mg  81 mg Oral Daily    atorvastatin  (LIPITOR) tablet  40 mg Oral QPM    carvedilol  (COREG ) tablet  12.5 mg Oral 2x/day-Food    cephalexin  (KEFLEX ) capsule  500 mg Oral 4x/day    Correction/SSIP insulin  lispro 100 units/mL injection  3-14 Units Subcutaneous 4x/day AC    dapagliflozin (FARXIGA) tablet  10 mg Oral Daily    dextrose  (GLUTOSE) 40% oral gel  15 g Oral Q15 Min PRN    dextrose  50% (0.5 g/mL) injection - syringe  12.5 g Intravenous Q15 Min PRN    DULoxetine (CYMBALTA) delayed release capsule  20 mg Oral Daily    ergocalciferol-vitamin D2 (DRISDOL) capsule  50,000 Units Oral Q7 Days    furosemide  (LASIX ) tablet  20 mg Oral Daily    gabapentin  (NEURONTIN ) capsule  300 mg Oral 3x/day    glimepiride (AMARYL) tablet  4 mg Oral 2X/day    glucagon  injection 1 mg  1 mg IntraMUSCULAR Once PRN    hydroCHLOROthiazide  (HYDRODIURIL ) tablet  25 mg Oral Daily    ibuprofen  (MOTRIN ) tablet  600 mg Oral Q6H PRN    insulin  glargine 100 units/mL injection  15 Units Subcutaneous QPM    LORazepam  (ATIVAN ) tablet  0.5 mg Oral 2x/day    [START ON 11/28/2023] LORazepam  (  ATIVAN ) tablet  0.25 mg Oral 3x/day    [START ON 11/29/2023] LORazepam  (ATIVAN ) tablet  0.25 mg Oral 2x/day    [START ON 11/30/2023] LORazepam  (ATIVAN ) tablet  0.25 mg Oral NIGHTLY    losartan (COZAAR) tablet  50 mg Oral Daily    magnesium  hydroxide (MILK OF MAGNESIA) 400mg  per 5mL oral liquid  30 mL Oral HS PRN    omeprazole (PRILOSEC) capsule  40 mg Oral Daily    ranolazine  (RANEXA ) extended release tablet  500 mg Oral 2x/day    thyroid  (ARMOUR THYROID ) tablet  180 mg Oral Daily    tiotropium bromide (SPIRIVA  RESPIMAT) 2.5 mcg per inhalation oral inhaler - Nursing to administer  2 Puff Inhalation Daily        Allergies[1]       Vital Signs:    Filed Vitals:    11/25/23 1945 11/26/23 0754 11/26/23 2000 11/27/23 0700   BP: 123/77 131/76 125/81 (!) 156/97   Pulse: 89 84 87 86   Resp: 20 20 20 20    Temp: 36.8 C  (98.2 F) 36.7 C (98 F) 36.7 C (98 F) 36.8 C (98.3 F)   SpO2: 98% 98% 96% 98%        Labs:    Results for orders placed or performed during the hospital encounter of 11/23/23 (from the past 24 hours)   POC BLOOD GLUCOSE (RESULTS)    Collection Time: 11/26/23 10:38 AM   Result Value Ref Range    GLUCOSE, POC 372 (H) 70 - 100 mg/dl   POC BLOOD GLUCOSE (RESULTS)    Collection Time: 11/26/23  3:07 PM   Result Value Ref Range    GLUCOSE, POC 260 (H) 70 - 100 mg/dl   POC BLOOD GLUCOSE (RESULTS)    Collection Time: 11/26/23  7:04 PM   Result Value Ref Range    GLUCOSE, POC 182 (H) 70 - 100 mg/dl   POC BLOOD GLUCOSE (RESULTS)    Collection Time: 11/27/23  7:23 AM   Result Value Ref Range    GLUCOSE, POC 187 (H) 70 - 100 mg/dl        Mental Status Examination:  Sensorium/Alertness: Alert,   Orientation: Date, Person, Place, Situation  Appearance:Appears stated age,   Psychomotor Activity: Normal,   Abnormal Behaviors: None,   Attitude Towards Examiner: Cooperative,  Eye Contact: Normal,   Speech: Normal/Spontaneous,    Mood: Anxious, Depressed,   Affect: irritable   Perception: WNL  Though Process: Logical/Clear/Goal Oriented,  Thought Content: Suicidal? no    Thought Content: Homicidal? no  Thought Content: Delusions? no  Impulse Control:  Grossly Intact,   Concentration/Calculation/Attention Span: poor   How was the patient's Concentration/Calculation/Attention tested/assessed? Per observation and interview with patient   Recent Memory: WNL,  Remote Memory: WNL,   How was the patient's Remote Memory Tested/Assessed? Past Events, as it relates to history  Intelligence/Fund of Knowledge: Average  How was the patient's Intelligence/Fund of Knowledge Tested/Assessed? Based on history, Based on vocabulary, syntax, grammar, and content  Judgement: Fair,   How was the patient's Judgement Tested/Assessed? Per patient's behavior/history of present illness  Insight: Fair,   How was the patient's Insight Tested/Assessed?  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 0 Homelessness        Differential  Diagnoses:  Borderline personality disorder   Alcohol abuse induced mood disorder   THC abuse induced mood disorder       Treatment and Medication Recommendation:  -The patient is admitted for safety and stabilization.  Medicines will be instituted and adjusted as indicated.  We have discussed medicine side-effects, treatment options, and pregnancy precautions if applicable.  The patient agrees with the plan and will be encouraged to be active in groups and milieu treatment. Patient has been instructed of the unit rules and regulations and will be seen by the hospitalist for medical issues. Further changes will be dictated by clinical course.  I estimate patient length of stay will be 7-14 days   -behavioral health safety checks   -regular diet, full code  -provisional discharge disposition: home      11/27/23 Move patient to PICU.   11/24/23 Peer recovery coach consult for alcohol abuse and THC  Start Ativan  taper for alcohol withdrawal   Start Invega 3 mg Po daily for bipolar   She is on Keflex  for UTI, medical will review   Decrease Gabapentin  from 800 mg TID to 300 mg TID for 4 days then discontinue. Gabapentin  is not allowed in rehab. Patient has alcohol abuse.   Discontinue Vistaril 25 mg q 8 hrs PRN anxiety. Patient does not want Vistaril.   Decrease Cymbalta from 30 mg daily to 20 mg daily for depression, she is bipolar   Do not continue with acarbose, currently drinking heavily   Discontinue Abilify   Discontinue PRN Trazodone  due to bipolar disorder       I certify that the inpatient psychiatric admission continues to be medically necessary for (1) treatment which is reasonably expected to improve the patient's  condition, and/or (2) for diagnostic studies    Interaction Attestation: Clinical telemedicine services delivered using HIPAA-compliant interactive video-audio telecommunications while the patient and the rendering provider were not in the same physical location. Patient agreeable to telecommunication.    TELEMEDICINE DOCUMENTATION:    Patient Location:  [facility], [address], [place], [place] [address]  Provider Location: Remote  Patient/family aware of provider location:  yes  Patient/family consent for telemedicine:  yes  Examination observed and performed by:.Ezella Sanders, PMHNP         [1]   Allergies  Allergen Reactions    Aspirin       Unknown Reaction during childhood    Tomato     Tylenol  [Acetaminophen ]  Other Adverse Reaction (Add comment)     Fatty liver

## 2023-11-27 NOTE — Nurses Notes (Signed)
 Patient alert; tearful and stating I'm not a bad person, I'm really not. After much encouragement, patient did allow this nurse to administer her one time dose of Zyprexa Zydis. She is requesting to see her counselor. Voicemail message left for patient's counselor Sari. Patient also stating that she will NEVER see her current provider again and is requesting a new one. Safe environment maintained. Q15 minute and PRN visual safety checks in progress. See Mar/Interventions.

## 2023-11-27 NOTE — Care Plan (Signed)
 Problem: Adult Behavioral Health Plan of Care  Goal: Plan of Care Review  Outcome: Ongoing (see interventions/notes)  Goal: Patient-Specific Goal (Individualization)  Outcome: Ongoing (see interventions/notes)  Goal: Strengths and Vulnerabilities  Outcome: Ongoing (see interventions/notes)  Goal: Adheres to Safety Considerations for Self and Others  Outcome: Ongoing (see interventions/notes)  Intervention: Develop and Maintain Individualized Safety Plan  Recent Flowsheet Documentation  Taken 11/27/2023 1240 by Osa KIDD, RN  Safety Measures: safety rounds completed  Goal: Absence of New-Onset Illness or Injury  Outcome: Ongoing (see interventions/notes)  Intervention: Identify and Manage Fall Risk  Recent Flowsheet Documentation  Taken 11/27/2023 1240 by Osa KIDD, RN  Safety Promotion/Fall Prevention:   nonskid shoes/slippers when out of bed   safety round/check completed  Goal: Optimized Coping Skills in Response to Life Stressors  Outcome: Ongoing (see interventions/notes)  Goal: Develops/Participates in Therapeutic Alliance to Support Successful Transition  Outcome: Ongoing (see interventions/notes)  Goal: Rounds/Family Conference  Outcome: Ongoing (see interventions/notes)

## 2023-11-27 NOTE — Nurses Notes (Signed)
 Patient anxious, but cooperative with nursing staff this shift. Medication compliant. Safe environment maintained.

## 2023-11-27 NOTE — Nurses Notes (Signed)
 Pt A&O x4 and agitated but cooperative when redirected. Pt gait steady with no apparent issues. Pt denies SI, HI, and AVH. Pt rates anxiety 0/10 and depression 0/10 but states Yall are trying to mess with my meds and keep me in here. Pt also states she needs to calm down before going off. RN provided pt with reassurance and relaxation techniques such as a shower and taking time away in bedroom. Pt returned to nurse station stating she felt better but was still agitated.  Safe environment maintained. Q15 minute and PRN rounds continued.

## 2023-11-27 NOTE — Nurses Notes (Signed)
 Release of Information form requested by patient for her daughter Ericka Molt [phone]. ROI signed and placed on patient's chart. Charge nurse notified.

## 2023-11-27 NOTE — Group Note (Signed)
 [facility] MEDICINE [facility], INTENSIVE CARE BEHAVIORAL MEDICINE  [address]  [place] [address]    Group Note             Name: Jennifer [name]   Date of Birth: December 16, 1975   Today's Date: 11/26/2023   Group Start Time: 1000   Group End Time: 1100   Group Topic: RECREATION GROUP  Number of participants: 2      Summary of group discussion:   Group games to promote appropriate other interaction and decrease potential self imposed isolation tendencies.    [name]'s Participation and Response: Pt did not attend.      Suicidal/Homicidal Risk:  Currently denies SI/HI and expresses willingness to contact crisis services if needed.

## 2023-11-27 NOTE — Group Note (Incomplete)
 Group topic:  COMMUNITY MEETING GROUP    Date of group:  11/27/2023  Start time of group:  1600  End time of group:  1610    Attend:  []   Not attend: []   Attendance:  {GRP ATTENDANCE:[id number]}                 Summary of group discussion: Community Meeting          Jennifer [name]  is a 48 y.o. female participating in a community meeting group.    Patient observations:      Patient goals:      Bunny Marcus, PAT CARE [facility]  11/27/2023, 16:34

## 2023-11-27 NOTE — Group Note (Signed)
 Group topic:  COMMUNITY MEETING GROUP    Date of group:  11/27/2023  Start time of group:  1600  End time of group:  1610    Attend:  []   Not attend: [x]   Attendance:  inpatient attended 00 minutes                 Summary of group discussion: CommunityMeeting      Jennifer [name]  is a 48 y.o. female participating in a community meeting group.    Patient observations:      Patient goals:      Bunny Marcus, PAT CARE [facility]  11/27/2023, 16:45

## 2023-11-27 NOTE — Nurses Notes (Signed)
 PATIENT CALM AND COOPERATIVE, UP AD LIB ON UNIT INTERACTING WELL WITH OTHERS. PATIENT WAS MED COMPLIANT AND IS AWARE OF THE MEDS GIVEN. PT RATES ANXIETY A 2 ON A SCALE OF 1-10. PT DENIES HAVING ANY DEPRESSION, A/V/H OR THOUGHTS OF SI/HI. NO PAIN WAS REPORTED. EVENING SNACKS WERE PROVIDED. VISUAL SAFETY CHECKS ARE DONE Q 15 MINS AND PRN.

## 2023-11-27 NOTE — Group Note (Signed)
 Group topic:  ANGER MANAGEMENT    Date of group:  11/27/2023  Start time of group:  1815  End time of group:  1830    Attended: []   Not Attend group: [x]   Attendance:  inpatient attended 00 minutes                 Summary of group discussion: Anger Management      DESMA WILKOWSKI  is a 48 y.o. female participating in anger management individual.    Patient observations:      Patient goals:      Shona Darner, RN  11/27/2023, 18:41

## 2023-11-27 NOTE — Behavioral Health (Signed)
 CN note: Jennifer [name] gave order to move pt to PICU for taking a phone away from a patient:    Jennifer [name] was sitting in the floor at the end of the hallway talking to another patient.  I gathered the team and asked Jennifer [name] to speak with me in private about taking the phone away from a patient.      Jennifer [name] became very angry yelling at me calling me a liar that I was making up lies on her. Myself, Jennifer [name] ORN Nurse Manger, 2 Security guards and Counselor Jennifer [name] all were present, I was trying to de-escalate patient with calm, quiet, supportive verbal intervention.Jennifer [name] would scream over top of me and Jennifer [name] ORN.  Her thought process was irrational and over dramatic for the situation.    After much discussion pt finally agreed to walk to PICU, she changed into PICU scrubs. I did get a prn Zyprexa for pt but she states she is now going to refuse all medications.

## 2023-11-27 NOTE — Group Note (Signed)
 [facility] MEDICINE [facility], INTENSIVE CARE BEHAVIORAL MEDICINE  [address]  [place] [address]    Group Note             Name: BRECKIN ZAFAR   Date of Birth: 11-Aug-1975   Today's Date: 11/26/2023   Group Start Time: 1400   Group End Time: 1500   Group Topic: ACTIVITY GROUP  Number of participants: 3      Summary of group discussion:   Recreation/Leisure activity options pursuit.    Lovelyn's Participation and Response: Pt did not attend.      Suicidal/Homicidal Risk:  Currently denies SI/HI and expresses willingness to contact crisis services if needed.

## 2023-11-27 NOTE — Nurses Notes (Signed)
 PRN ibuprofen  administered for pain

## 2023-11-27 NOTE — Behavioral Health (Signed)
 CN note: During provider rounds, patient asking for discharge.  Provider Ezella told patient she was not ready and pt became very upset and angry.  Pt walked out of room and took a phone away from another patient and called [facility] news. The patient reported this to staff and states they are scared of her now.

## 2023-11-27 NOTE — Nurses Notes (Signed)
 Patient is sitting in the hallway yelling at staff. Patient is uncooperative and argumentative. Demanding to be discharged immediately for fear of becoming homeless. Patient signed an Intent to Leave at 26. ITL is up on 12/01/23 at 0920. Patient is being transferred to PICU.

## 2023-11-27 NOTE — Care Plan (Signed)
 PT IS CALM AND COOPERATIVE, COMPLIANT WITH MEDS AND TREATMENTS       Problem: Adult Behavioral Health Plan of Care  Goal: Plan of Care Review  Outcome: Ongoing (see interventions/notes)     Problem: Adult Behavioral Health Plan of Care  Goal: Patient-Specific Goal (Individualization)  Outcome: Ongoing (see interventions/notes)     Problem: Adult Behavioral Health Plan of Care  Goal: Adheres to Safety Considerations for Self and Others  Outcome: Ongoing (see interventions/notes)

## 2023-11-28 DIAGNOSIS — Z7689 Persons encountering health services in other specified circumstances: Secondary | ICD-10-CM

## 2023-11-28 DIAGNOSIS — M25572 Pain in left ankle and joints of left foot: Secondary | ICD-10-CM

## 2023-11-28 DIAGNOSIS — E1165 Type 2 diabetes mellitus with hyperglycemia: Secondary | ICD-10-CM

## 2023-11-28 LAB — POC BLOOD GLUCOSE (RESULTS)
GLUCOSE, POC: 200 mg/dL — ABNORMAL HIGH (ref 70–100)
GLUCOSE, POC: 374 mg/dL — ABNORMAL HIGH (ref 70–100)
GLUCOSE, POC: 164 mg/dL — ABNORMAL HIGH (ref 70–100)
GLUCOSE, POC: 341 mg/dL — ABNORMAL HIGH (ref 70–100)
GLUCOSE, POC: 124 mg/dL — ABNORMAL HIGH (ref 70–100)

## 2023-11-28 MED ORDER — QUETIAPINE 25 MG TABLET
50.0000 mg | ORAL_TABLET | Freq: Once | ORAL | Status: AC
Start: 2023-11-28 — End: 2023-11-28
  Administered 2023-11-28: 50 mg via ORAL
  Filled 2023-11-28: qty 2

## 2023-11-28 MED ORDER — NAPROXEN 250 MG TABLET
500.0000 mg | ORAL_TABLET | Freq: Two times a day (BID) | ORAL | Status: DC
Start: 2023-11-28 — End: 2023-12-01
  Administered 2023-11-28 – 2023-12-01 (×6): 500 mg via ORAL
  Filled 2023-11-28 (×6): qty 2

## 2023-11-28 MED ORDER — OLANZAPINE 5 MG DISINTEGRATING TABLET
5.0000 mg | ORAL_TABLET | Freq: Two times a day (BID) | ORAL | Status: DC | PRN
Start: 2023-11-28 — End: 2023-12-01
  Administered 2023-11-28 – 2023-11-30 (×5): 5 mg via ORAL
  Filled 2023-11-28 (×6): qty 1

## 2023-11-28 MED ORDER — GABAPENTIN 600 MG TABLET
600.0000 mg | ORAL_TABLET | Freq: Three times a day (TID) | ORAL | Status: DC
Start: 2023-11-28 — End: 2023-12-01
  Administered 2023-11-28 – 2023-12-01 (×9): 600 mg via ORAL
  Filled 2023-11-28 (×9): qty 1

## 2023-11-28 MED ORDER — PALIPERIDONE ER 6 MG TABLET,EXTENDED RELEASE 24 HR
6.0000 mg | EXTENDED_RELEASE_TABLET | Freq: Every day | ORAL | Status: DC
Start: 2023-11-28 — End: 2023-12-01
  Administered 2023-11-28 – 2023-12-01 (×4): 6 mg via ORAL
  Filled 2023-11-28 (×4): qty 1

## 2023-11-28 MED ORDER — DIVALPROEX 250 MG TABLET,DELAYED RELEASE
250.0000 mg | DELAYED_RELEASE_TABLET | Freq: Two times a day (BID) | ORAL | Status: DC
Start: 2023-11-28 — End: 2023-11-29
  Administered 2023-11-28 (×2): 0 mg via ORAL
  Filled 2023-11-28 (×3): qty 1

## 2023-11-28 MED ORDER — MONTELUKAST 10 MG TABLET
10.0000 mg | ORAL_TABLET | Freq: Every evening | ORAL | Status: DC
Start: 2023-11-28 — End: 2023-12-01
  Administered 2023-11-28 – 2023-11-30 (×3): 10 mg via ORAL
  Filled 2023-11-28 (×3): qty 1

## 2023-11-28 MED ORDER — ACARBOSE 100 MG TABLET
100.0000 mg | ORAL_TABLET | Freq: Two times a day (BID) | ORAL | Status: DC
Start: 2023-11-28 — End: 2023-12-01
  Administered 2023-11-28 – 2023-12-01 (×7): 100 mg via ORAL

## 2023-11-28 MED ORDER — INSULIN GLARGINE 100 UNITS/ML SUBQ - CHARGE BY DOSE
18.0000 [IU] | Freq: Every evening | SUBCUTANEOUS | Status: DC
Start: 2023-11-28 — End: 2023-12-01
  Administered 2023-11-28 – 2023-11-30 (×3): 18 [IU] via SUBCUTANEOUS
  Filled 2023-11-28 (×3): qty 18

## 2023-11-28 MED ORDER — QUETIAPINE 25 MG TABLET
50.0000 mg | ORAL_TABLET | ORAL | Status: AC
Start: 2023-11-28 — End: 2023-11-28
  Administered 2023-11-28: 50 mg via ORAL
  Filled 2023-11-28: qty 2

## 2023-11-28 NOTE — Nurses Notes (Addendum)
 PT UP ON UNIT. PLEASANT AND COOPERATIVE. INTERACTING WELL WITH OTHER PATIENTS. DENIES ANXIETY AND DEPRESSION. REPORTS JUST OVERWHELMED WITH EVERYTHING GOING ON WITH HER HOME SITUATION. EAGER FOR DISCHARGE TO DEAL WITH THOSE THINGS.  DENIES SI/HI/HALLUCINATIONS/DELUSIONS. REPORTS EATING AND SLEEPING WELL. REFUSED DEPAKOTE REPORTING She HAS HAD IT BEFORE AND DID NOT LIKE IT.   PRN ALBUTEROL  GIVEN FOR SOB AND PRN MOTRIN  FOR C/O LEFT ANKLE PAIN PER PT REQUEST. NO ACUTE DISTRESS NOTED. WILL CONTINUE TO VISUALLY MONITOR Q15 MINS AND PRN THROUGHOUT SHIFT BY STAFF.     1355-PT AGITATED WHEN ROUNDING WITH PROVIDER OVER NOT BEING DISCHARGED UNTIL FRIDAY. PRN PO ZYPREXA ZYDIS GIVEN PER PT REQUEST.

## 2023-11-28 NOTE — Nurses Notes (Signed)
 Pt A&O x4 and calm and cooperative but suspicious about medications- RN educated pt on medications. Pt refused Depakote stating that is not a med she needs. Pt frequently seeks out staff. Pt gait steady with no apparent issues. Pt denies SI, HI, and AVH. Pt rates anxiety 8/10 and depression 0/10. Pt c/o L ankle pain- ice pack provided. Safe environment maintained. Q15 minute and PRN rounds continued.

## 2023-11-28 NOTE — Nurses Notes (Signed)
 One time dose of Seroquel administered for anxiety and insomnia

## 2023-11-28 NOTE — Progress Notes (Signed)
 [facility]    Inpatient Psychiatric Progress Note    Patient's Full Name: Jennifer [name]   Patient's Date of Birth: 02/18/1976   Patient's Age: 48 y.o.   Patient's Legal Sex: female   Patient's MRN: [id number]   Patient's Date of Admission: 11/23/2023   Current Date: 11/28/2023 07:10     Patient's Room/Bed: PICU02/A       History of Present Illness:48 year old with bipolar disorder (diagnosed at age 70 years old per patient), multiple suicide attempts in the past per patient (she states a lot.. first one when I was 16...  I took a bunch of pills), multiple psychiatric hospitalizations and alcohol abuse (currently drinking half a gallon a day). Admitted for suicide ideation.   Patient irritable. Patient moved to PICU yesterday due to agitation. Increase Invega PO from 3 mg to 6 mg daily for mood today. Start Depakote 250 mg PO BID for mood.   She states My rights are being violated.SABRASABRASABRAI am discriminated against... I was not even suicidal.  She minimizes symptoms. Patient denies SI, HI and AVH. Due to multiple suicide attempts in the past patient will benefit from staying here until intent to leave is due on Friday 12/01/23.    She is at high risk for self-injury as evidenced by substance abuse, hx of multiple suicide attempts, poor compliance with medications, defiant, poor impulse control and inability to cope with daily stressors. Start PRN Zyprexa 5 mg PO every 12 hrs PRN agitation.   Case discussed with Lolita Rosser, RN, BSN   Review of Systems   Constitutional:  Denies pain.   HENT: Negative.     Eyes: Negative.    Respiratory: Negative.     Cardiovascular: Negative.    Gastrointestinal: Negative.    Genitourinary: Negative.    Musculoskeletal:  denies  myalgias.   Skin: Negative.    Neurological: Negative.    Psychiatric/Behavioral:  Positive for depression, anxiety,agitation       Medications and Allergies:    Current Facility-Administered Medications   Medication Dose Route  Frequency    albuterol  90 mcg per inhalation oral inhaler - Nursing to administer  2 Puff Inhalation Q6H PRN    aluminum -magnesium  hydroxide-simethicone  (MAG-AL PLUS) 200-200-20 mg per 5 mL oral liquid  30 mL Oral Q4H PRN    aspirin  chewable tablet 81 mg  81 mg Oral Daily    atorvastatin  (LIPITOR) tablet  40 mg Oral QPM    carvedilol  (COREG ) tablet  12.5 mg Oral 2x/day-Food    cephalexin  (KEFLEX ) capsule  500 mg Oral 4x/day    Correction/SSIP insulin  lispro 100 units/mL injection  3-14 Units Subcutaneous 4x/day AC    dapagliflozin (FARXIGA) tablet  10 mg Oral Daily    dextrose  (GLUTOSE) 40% oral gel  15 g Oral Q15 Min PRN    dextrose  50% (0.5 g/mL) injection - syringe  12.5 g Intravenous Q15 Min PRN    DULoxetine (CYMBALTA) delayed release capsule  20 mg Oral Daily    ergocalciferol-vitamin D2 (DRISDOL) capsule  50,000 Units Oral Q7 Days    furosemide  (LASIX ) tablet  20 mg Oral Daily    glimepiride (AMARYL) tablet  4 mg Oral 2X/day    glucagon  injection 1 mg  1 mg IntraMUSCULAR Once PRN    hydroCHLOROthiazide  (HYDRODIURIL ) tablet  25 mg Oral Daily    ibuprofen  (MOTRIN ) tablet  600 mg Oral Q6H PRN    insulin  glargine 100 units/mL injection  15 Units Subcutaneous  QPM    LORazepam  (ATIVAN ) tablet  0.25 mg Oral 3x/day    [START ON 11/29/2023] LORazepam  (ATIVAN ) tablet  0.25 mg Oral 2x/day    [START ON 11/30/2023] LORazepam  (ATIVAN ) tablet  0.25 mg Oral NIGHTLY    losartan (COZAAR) tablet  50 mg Oral Daily    magnesium  hydroxide (MILK OF MAGNESIA) 400mg  per 5mL oral liquid  30 mL Oral HS PRN    omeprazole (PRILOSEC) capsule  40 mg Oral Daily    paliperidone (INVEGA) extended release tablet  3 mg Oral Daily    ranolazine  (RANEXA ) extended release tablet  500 mg Oral 2x/day    thyroid  (ARMOUR THYROID ) tablet  180 mg Oral Daily    tiotropium bromide (SPIRIVA  RESPIMAT) 2.5 mcg per inhalation oral inhaler - Nursing to administer  2 Puff Inhalation Daily        Allergies[1]       Vital Signs:    Filed Vitals:    11/26/23 0754  11/26/23 2000 11/27/23 0700 11/27/23 1900   BP: 131/76 125/81 (!) 156/97 134/76   Pulse: 84 87 86 91   Resp: 20 20 20 20    Temp: 36.7 C (98 F) 36.7 C (98 F) 36.8 C (98.3 F) 36.4 C (97.6 F)   SpO2: 98% 96% 98% 96%        Labs:    Results for orders placed or performed during the hospital encounter of 11/23/23 (from the past 24 hours)   POC BLOOD GLUCOSE (RESULTS)    Collection Time: 11/27/23  7:23 AM   Result Value Ref Range    GLUCOSE, POC 187 (H) 70 - 100 mg/dl   POC BLOOD GLUCOSE (RESULTS)    Collection Time: 11/27/23 11:03 AM   Result Value Ref Range    GLUCOSE, POC 278 (H) 70 - 100 mg/dl   POC BLOOD GLUCOSE (RESULTS)    Collection Time: 11/27/23  4:06 PM   Result Value Ref Range    GLUCOSE, POC 231 (H) 70 - 100 mg/dl   POC BLOOD GLUCOSE (RESULTS)    Collection Time: 11/27/23  7:10 PM   Result Value Ref Range    GLUCOSE, POC 487 (H) 70 - 100 mg/dl   POC BLOOD GLUCOSE (RESULTS)    Collection Time: 11/28/23  7:03 AM   Result Value Ref Range    GLUCOSE, POC 200 (H) 70 - 100 mg/dl        Mental Status Examination:  Sensorium/Alertness: Alert,   Orientation: Date, Person, Place, Situation  Appearance:Appears stated age,   Psychomotor Activity: Normal,   Abnormal Behaviors: None,   Attitude Towards Examiner: Cooperative,  Eye Contact: Normal,   Speech: Normal/Spontaneous,    Mood: Anxious, Depressed,   Affect: irritable   Perception: WNL  Though Process: Logical/Clear/Goal Oriented,  Thought Content: Suicidal? no    Thought Content: Homicidal? no  Thought Content: Delusions? no  Impulse Control:  Grossly Intact,   Concentration/Calculation/Attention Span: poor   How was the patient's Concentration/Calculation/Attention tested/assessed? Per observation and interview with patient   Recent Memory: WNL,  Remote Memory: WNL,   How was the patient's Remote Memory Tested/Assessed? Past Events, as it relates to history  Intelligence/Fund of Knowledge: Average  How was the patient's Intelligence/Fund of Knowledge  Tested/Assessed? Based on history, Based on vocabulary, syntax, grammar, and content  Judgement: Fair,   How was the patient's Judgement Tested/Assessed? Per patient's behavior/history of present illness  Insight: Fair,   How was the patient's Insight Tested/Assessed? 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 0 Homelessness        Differential Diagnoses:  Borderline personality disorder   Alcohol abuse induced mood disorder   THC abuse induced mood disorder       Treatment and Medication Recommendation:  -The patient is admitted for safety and stabilization.  Medicines will be instituted and adjusted as indicated.  We have discussed medicine side-effects, treatment options, and pregnancy precautions if applicable.  The patient agrees with the plan and will be encouraged to be active in groups and milieu treatment. Patient has been instructed of the unit rules and regulations and will be seen by the hospitalist for medical issues. Further changes will be dictated by clinical course.  I estimate patient length of stay will be 7-14 days   -behavioral health safety checks   -regular diet, full code  -provisional discharge disposition: home Friday 12/01/23, patient signed an intent to leave 11/27/23    11/28/23 Patient moved to PICU yesterday due to agitation. Due to multiple suicide attempts in the past patient will benefit from staying here until intent to leave is due on Friday 12/01/23.  Increase Invega PO from 3 mg to 6 mg daily for mood. Start Depakote 250 mg PO BID for mood. Start PRN Zyprexa 5 mg PO every 12 hrs PRN agitation.   11/27/23 Move patient to PICU.   11/24/23 Peer recovery coach consult for alcohol abuse and THC  Start Ativan  taper for alcohol withdrawal   Start Invega 3 mg Po daily for bipolar    She is on Keflex  for UTI, medical will review   Decrease Gabapentin  from 800 mg TID to 300 mg TID for 4 days then discontinue. Gabapentin  is not allowed in rehab. Patient has alcohol abuse.   Discontinue Vistaril 25 mg q 8 hrs PRN anxiety. Patient does not want Vistaril.   Decrease Cymbalta from 30 mg daily to 20 mg daily for depression, she is bipolar   Do not continue with acarbose, currently drinking heavily   Discontinue Abilify   Discontinue PRN Trazodone  due to bipolar disorder       I certify that the inpatient psychiatric admission continues to be medically necessary for (1) treatment which is reasonably expected to improve the patient's condition, and/or (2) for diagnostic studies    Interaction Attestation: Clinical telemedicine services delivered using HIPAA-compliant interactive video-audio telecommunications while the patient and the rendering provider were not in the same physical location. Patient agreeable to telecommunication.    TELEMEDICINE DOCUMENTATION:    Patient Location:  [facility], [address], [place], [place] [address]  Provider Location: Remote  Patient/family aware of provider location:  yes  Patient/family consent for telemedicine:  yes  Examination observed and performed by:.Ezella Sanders, PMHNP         [1]   Allergies  Allergen Reactions    Aspirin       Unknown Reaction during childhood    Tomato     Tylenol  [Acetaminophen ]  Other Adverse Reaction (Add comment)     Fatty liver

## 2023-11-28 NOTE — Progress Notes (Signed)
 [place] MEDICINE [facility] IP PROGRESS NOTE      Jennifer [name], Jennifer [name]  Date of Admission:  11/23/2023  Date of Birth:  March 16, 1976  Date of Service:  11/28/2023    Hospital Day:  LOS: 5 days     HPI:  Patient is doing fair.  She does complain of left ankle pain.  She states it swells pretty bad and has [name] lot of discomfort she has had [name] long history of pain with this and usually wears compression stocking.  She is on Lasix .  Her blood sugars are up and I will also need to increase her Lantus.  Vital Signs:  Temp (24hrs) Max:36.6 C (97.9 F)      Temperature: 36.6 C (97.9 F)  BP (Non-Invasive): 119/71  Heart Rate: 86  Respiratory Rate: 16  SpO2: 93 %    Current Medications:  albuterol  90 mcg per inhalation oral inhaler - Nursing to administer, 2 Puff, Inhalation, Q6H PRN  aluminum -magnesium  hydroxide-simethicone  (MAG-AL PLUS) 200-200-20 mg per 5 mL oral liquid, 30 mL, Oral, Q4H PRN  aspirin  chewable tablet 81 mg, 81 mg, Oral, Daily  atorvastatin  (LIPITOR) tablet, 40 mg, Oral, QPM  carvedilol  (COREG ) tablet, 12.5 mg, Oral, 2x/day-Food  cephalexin  (KEFLEX ) capsule, 500 mg, Oral, 4x/day  Correction/SSIP insulin  lispro 100 units/mL injection, 3-14 Units, Subcutaneous, 4x/day AC  dapagliflozin (FARXIGA) tablet, 10 mg, Oral, Daily  dextrose  (GLUTOSE) 40% oral gel, 15 g, Oral, Q15 Min PRN  dextrose  50% (0.5 g/mL) injection - syringe, 12.5 g, Intravenous, Q15 Min PRN  divalproex (DEPAKOTE) 12 hr delayed release tablet, 250 mg, Oral, 2x/day  DULoxetine (CYMBALTA) delayed release capsule, 20 mg, Oral, Daily  ergocalciferol-vitamin D2 (DRISDOL) capsule, 50,000 Units, Oral, Q7 Days  furosemide  (LASIX ) tablet, 20 mg, Oral, Daily  glimepiride (AMARYL) tablet, 4 mg, Oral, 2X/day  glucagon  injection 1 mg, 1 mg, IntraMUSCULAR, Once PRN  hydroCHLOROthiazide  (HYDRODIURIL ) tablet, 25 mg, Oral, Daily  ibuprofen  (MOTRIN ) tablet, 600 mg, Oral, Q6H PRN  insulin  glargine 100 units/mL injection, 18 Units, Subcutaneous,  QPM  LORazepam  (ATIVAN ) tablet, 0.25 mg, Oral, 3x/day  [START ON 11/29/2023] LORazepam  (ATIVAN ) tablet, 0.25 mg, Oral, 2x/day  [START ON 11/30/2023] LORazepam  (ATIVAN ) tablet, 0.25 mg, Oral, NIGHTLY  losartan (COZAAR) tablet, 50 mg, Oral, Daily  magnesium  hydroxide (MILK OF MAGNESIA) 400mg  per 5mL oral liquid, 30 mL, Oral, HS PRN  omeprazole (PRILOSEC) capsule, 40 mg, Oral, Daily  paliperidone (INVEGA) extended release tablet, 6 mg, Oral, Daily  ranolazine  (RANEXA ) extended release tablet, 500 mg, Oral, 2x/day  thyroid  (ARMOUR THYROID ) tablet, 180 mg, Oral, Daily  tiotropium bromide (SPIRIVA  RESPIMAT) 2.5 mcg per inhalation oral inhaler - Nursing to administer, 2 Puff, Inhalation, Daily        Current Orders:  Active Orders   Diet    DIET DIABETIC Calorie amount: CC 2400-2600; Do you want to initiate MNT Protocol? Yes; Additional modifications/limitations: DOUBLE ENTREE, DOUBLE MEATS     Frequency: All Meals     Number of Occurrences: 1 Occurrences   Nursing    APPLY TED HOSE     Frequency: ONE TIME     Number of Occurrences: 1 Occurrences    BEHAVIORAL MED SAFETY CHECKS     Frequency: Q15MIN     Number of Occurrences: Until Specified    ENCOURAGE AMBULATION AND ROM EXERCISES     Frequency: UNTIL DISCONTINUED     Number of Occurrences: Until Specified    HYPOGLYCEMIA MANAGEMENT - CONSCIOUS PATIENT W/DIET ORDER  Frequency: UNTIL DISCONTINUED     Number of Occurrences: Until Specified    HYPOGLYCEMIA MANAGEMENT - UNCONSCIOUS/ALTERED/NPO PATIENT     Frequency: UNTIL DISCONTINUED     Number of Occurrences: Until Specified    HYPOGLYCEMIA TREATMENT ALGORITHM     Frequency: UNTIL DISCONTINUED     Number of Occurrences: Until Specified    MAINTAIN TED HOSE     Frequency: UNTIL DISCONTINUED     Number of Occurrences: Until Specified    MISCELLANEOUS MD/DO TO NURSE     Frequency: UNTIL DISCONTINUED     Number of Occurrences: Until Specified     Order Comments: Patient can take her Prilosec from home.  She states she brought  it with her I have discontinue Protonix .  Patient states it works better for her      PT IS LOW RISK FOR VENOUS THROMBOEMBOLISM     Frequency: CONTINUOUS     Number of Occurrences: Until Specified    VITAL SIGNS  EVERY 12 HRS     Frequency: EVERY 12 HRS     Number of Occurrences: Until Specified   Code Status    FULL CODE: ATTEMPT RESUSCITATION / CPR     Frequency: CONTINUOUS     Number of Occurrences: Until Specified     Order Comments: Patient wishes for full ICU level care including advanced airway interventions / mechanical ventilation.     In the event of pulseless cardiac arrest, patient consents to ACLS (advanced cardiac life support) to attempt resuscitation.  IE - Consents to chest compressions, life support including intubation, mechanical ventilation, defibrillation/cardioversion as indicated.         Consult    IP CONSULT TO HOSPITALIST Requested Provider; SHELTON BOOKMAN     Frequency: ONE TIME     Number of Occurrences: 1 Occurrences    IP CONSULT TO PEER RECOVERY COACH     Frequency: ONE TIME     Number of Occurrences: 1 Occurrences   Behavioral Health Services    CIWA-AR ASSESSMENT     Frequency: Q4H     Number of Occurrences: Until Specified   Point of Care Testing    PERFORM POC WHOLE BLOOD GLUCOSE     Frequency: TID AC & HS     Number of Occurrences: Until Specified   Precaution    BMED SPECIAL PRECAUTIONS OBSERVE EVERY FIFTEEN MINUTES     Frequency: UNTIL DISCONTINUED     Number of Occurrences: Until Specified    SUICIDE PRECAUTIONS - MODERATE RISK     Frequency: UNTIL DISCONTINUED     Number of Occurrences: Until Specified     Order Comments: ASQ Screening Tool:    MODERATE RISK PRECAUTIONS: Patients who screen positive for any or all ASQ questions 1-4 but NOT question 5 -- Nurse will implement Enhanced Safety Checks on patient with hourly rounding.     MODERATE AND HIGH RISK PRECAUTIONS: For all patients who have [name] positive suicide risk screening, the nurse will inform the primary team of the  patient's positive screening. For all patients who have [name] positive suicide risk screening, the nurse will place an order for [name] SUICIDE RISK ASSESMENT which should be completed by [name] LIP / Clinical Therapist to fully assess [name] patient's risk. Any additional interventions for safety will be implemented based on the patient's risk assessment and further determined by the licensed LIP / Clinical Therapist. Suicide prevention information and resources will be provided to patients who are found to be at risk for  suicide upon discharge.    Policy Reference: Please follow your local facility policy for any deviations from this process.    ..SABRA   Privilege Level    PERMITTED VISITORS     Frequency: UNTIL DISCONTINUED     Number of Occurrences: Until Specified   Medications    albuterol  90 mcg per inhalation oral inhaler - Nursing to administer     Frequency: Q6H PRN     Dose: 2 Puff     Route: Inhalation    aluminum -magnesium  hydroxide-simethicone  (MAG-AL PLUS) 200-200-20 mg per 5 mL oral liquid     Frequency: Q4H PRN     Dose: 30 mL     Route: Oral    aspirin  chewable tablet 81 mg     Frequency: Daily     Dose: 81 mg     Route: Oral    atorvastatin  (LIPITOR) tablet     Frequency: QPM     Dose: 40 mg     Route: Oral    carvedilol  (COREG ) tablet     Frequency: 2x/day-Food     Dose: 12.5 mg     Route: Oral    cephalexin  (KEFLEX ) capsule     Frequency: 4x/day     Dose: 500 mg     Route: Oral    Correction/SSIP insulin  lispro 100 units/mL injection     Frequency: 4x/day AC     Dose: 3-14 Units     Route: Subcutaneous    dapagliflozin (FARXIGA) tablet     Frequency: Daily     Dose: 10 mg     Route: Oral    dextrose  (GLUTOSE) 40% oral gel     Frequency: Q15 Min PRN     Dose: 15 g     Route: Oral    dextrose  50% (0.5 g/mL) injection - syringe     Frequency: Q15 Min PRN     Dose: 12.5 g     Route: Intravenous    divalproex (DEPAKOTE) 12 hr delayed release tablet     Frequency: 2x/day     Dose: 250 mg     Route: Oral    DULoxetine  (CYMBALTA) delayed release capsule     Frequency: Daily     Dose: 20 mg     Route: Oral    ergocalciferol-vitamin D2 (DRISDOL) capsule     Frequency: Q7 Days     Dose: 50,000 Units     Route: Oral    furosemide  (LASIX ) tablet     Frequency: Daily     Dose: 20 mg     Route: Oral    glimepiride (AMARYL) tablet     Frequency: 2X/day     Dose: 4 mg     Route: Oral    glucagon  injection 1 mg     Frequency: Once PRN     Dose: 1 mg     Route: IntraMUSCULAR    hydroCHLOROthiazide  (HYDRODIURIL ) tablet     Frequency: Daily     Dose: 25 mg     Route: Oral    ibuprofen  (MOTRIN ) tablet     Frequency: Q6H PRN     Dose: 600 mg     Route: Oral     Order Comments: Pt takes motrin  at home    insulin  glargine 100 units/mL injection     Frequency: QPM     Dose: 18 Units     Route: Subcutaneous    LORazepam  (ATIVAN ) tablet     Frequency: 3x/day  Dose: 0.25 mg     Route: Oral    LORazepam  (ATIVAN ) tablet     Frequency: 2x/day     Dose: 0.25 mg     Route: Oral    LORazepam  (ATIVAN ) tablet     Frequency: NIGHTLY     Dose: 0.25 mg     Route: Oral    losartan (COZAAR) tablet     Frequency: Daily     Dose: 50 mg     Route: Oral    magnesium  hydroxide (MILK OF MAGNESIA) 400mg  per 5mL oral liquid     Frequency: HS PRN     Dose: 30 mL     Route: Oral    omeprazole (PRILOSEC) capsule     Frequency: Daily     Dose: 40 mg     Route: Oral    paliperidone (INVEGA) extended release tablet     Frequency: Daily     Dose: 6 mg     Route: Oral    ranolazine  (RANEXA ) extended release tablet     Frequency: 2x/day     Dose: 500 mg     Route: Oral    thyroid  (ARMOUR THYROID ) tablet     Frequency: Daily     Dose: 180 mg     Route: Oral    tiotropium bromide (SPIRIVA  RESPIMAT) 2.5 mcg per inhalation oral inhaler - Nursing to administer     Frequency: Daily     Dose: 2 Puff     Route: Inhalation        Review of Systems:  Focused review of system was completed. Refer to the HPI for ROS details.     Today's Physical Exam:    General:  Patient is pleasant and  cooperative  HEENT: NC/AT   eyes  normal  Lungs:  Lungs clear, normal breathing, non-labored  CV: Regular rate and rhythm.   Abd:  non-tender, soft.  Extremities: no edema, normal appearance, pulses intact  Skin: no rashes, lesions,abscesses. Normal color.  Neuro: No focal deficits. Normal tone.         I/O:  I/O last 24 hours:  No intake or output data in the 24 hours ending 11/28/23 1037  I/O current shift:  No intake/output data recorded.    Labs  Please indicate ordered or reviewed)  Reviewed: Lab Results Today:    Results for orders placed or performed during the hospital encounter of 11/23/23 (from the past 24 hours)   POC BLOOD GLUCOSE (RESULTS)   Result Value Ref Range    GLUCOSE, POC 278 (H) 70 - 100 mg/dl   POC BLOOD GLUCOSE (RESULTS)   Result Value Ref Range    GLUCOSE, POC 231 (H) 70 - 100 mg/dl   POC BLOOD GLUCOSE (RESULTS)   Result Value Ref Range    GLUCOSE, POC 487 (H) 70 - 100 mg/dl   POC BLOOD GLUCOSE (RESULTS)   Result Value Ref Range    GLUCOSE, POC 200 (H) 70 - 100 mg/dl          Problem List:  Active Hospital Problems   (*Primary Problem)    Diagnosis    *Suicidal ideation       Nutrition:    DIET DIABETIC Calorie amount: CC 2400-2600; Do you want to initiate MNT Protocol? Yes; Additional modifications/limitations: DOUBLE ENTREE, DOUBLE MEATS    Additional clinical characteristics related to nutrition:    - monitor for weight changes   - monitor intake and output    - monitor  bowel functions                  Assessment/ Plan:  Uncontrolled diabetes.  I will increase her Lantus to 18 units.  Also order knee-high Ted hose stockings to help her ankle more than anything.  We will continue to follow    Alm Barge, PA-C

## 2023-11-28 NOTE — Group Note (Signed)
 [facility] MEDICINE [facility], INTENSIVE CARE BEHAVIORAL MEDICINE  [address]  [place] [address]    Group Note             Name: Jennifer [name]   Date of Birth: 10-Jan-1976   Today's Date: 11/27/2023   Group Start Time: 1000   Group End Time: 1100   Group Topic: RECREATION GROUP  Number of participants: 0      Summary of group discussion:   Group games to promote appropriate other interaction and decrease self imposed isolation tendencies.    Jennifer [name]'s Participation and Response: Pt on other unit this day/date.      Suicidal/Homicidal Risk:  Currently denies SI/HI and expresses willingness to contact crisis services if needed.

## 2023-11-28 NOTE — Care Plan (Signed)
 CARE PLAN ONGOING     Problem: Adult Behavioral Health Plan of Care  Goal: Patient-Specific Goal (Individualization)  Outcome: Ongoing (see interventions/notes)  Flowsheets (Taken 11/28/2023 0959)  Individualized Care Needs: EFFECTIVE COPING     Problem: Adult Behavioral Health Plan of Care  Goal: Strengths and Vulnerabilities  Outcome: Ongoing (see interventions/notes)     Problem: Adult Behavioral Health Plan of Care  Goal: Optimized Coping Skills in Response to Life Stressors  Outcome: Ongoing (see interventions/notes)     Problem: Anxiety Signs/Symptoms  Goal: Improved Mood Symptoms (Anxiety Signs/Symptoms)  Outcome: Ongoing (see interventions/notes)

## 2023-11-28 NOTE — Behavioral Health (Signed)
 The counselor met with the patient who was upset she wanted to be discharged today.  She stated that she had a cardiac appointment tomorrow with Dr. Neomi in [place], [place]  at 9:30 a.m..  The patient kept arguing with the counselor about whether or not she needed to be discharge.    The counselor tried to explain that her action yesterday gave merit to why the provider needed to keep her here and she would not want to hear it.  The patient stated that she also did not want to see the provider today when she did rounds.    The counselor offered to let her make some phone calls today as the patient stated she needed to call the unemployment office, her landlord, and the cardiologist.    The counselor the patient called the work force in [place], [place]  at [phone] who stated that they needed a work excuse saying the patient could return to work even though she is actually unemployed it just releases her to be able to continue to looking for a job.  They also stated that she needed to call back on Thursday to file for her unemployment.  The counselor stated that she would try to come over and meet with them to let her call on Thursday but if not she would make a note on the work sheet for the nurses to let her make an extra phone call and why.    They then called her cardiologist Dr. Elspeth ward at [phone] and rescheduled her appointment for Wednesday 12/06/2023 at 2:30 p.m.    The counselor let the patient call her friend to see about getting her landlord [employer] phone number but her friend stated that she talked to a mode stated that he was going to weigh in speak to the patient when she was out of the hospital as her friend had told him where she was at.    The counselor the patient also talked about the SOP program and the patient was interested in the program.    The counselor explained the program briefly and let her know that a lady named Luke would come up and speak to her about it in  more detail.    The counselor left a voice mail for Luke over the SOP program. She let her know that the patient was interested in the program.     The patient stated that her son would be able to pick her up on Friday 12/01/2023 at 11:00 a.m..    The counselor will continue to work on care coordination and discharge planning as needed for the patient.

## 2023-11-28 NOTE — Group Note (Signed)
 Group topic:  COMMUNITY MEETING GROUP    Date of group:  11/28/2023  Start time of group:  2100  End time of group:  2130    Attend:  [x]   Not attend: []   Attendance:  inpatient attended all of group             Community meeting    Summary of group discussion:      Jennifer [name]  is a 48 y.o. female participating in a community meeting group.    Patient observations:      Patient goals:      Myrick Dimes, PAT CARE [facility]  11/28/2023, 22:44

## 2023-11-28 NOTE — Group Note (Signed)
 [facility] MEDICINE [facility], INTENSIVE CARE BEHAVIORAL MEDICINE  [address]  [place] [address]    Group Note             Name: Jennifer [name]   Date of Birth: [birth year]   Today's Date: 11/27/2023   Group Start Time: 1400   Group End Time: 1430   Group Topic: ACTIVITY GROUP  Number of participants: 3      Summary of group discussion:   Group games to promote appropriate other interaction and decrease potential self imposed isolation tendencies.    [name]'s Participation and Response: Pt on other unit this day/date.      Suicidal/Homicidal Risk:  Currently denies SI/HI and expresses willingness to contact crisis services if needed.

## 2023-11-29 ENCOUNTER — Ambulatory Visit (INDEPENDENT_AMBULATORY_CARE_PROVIDER_SITE_OTHER): Admitting: NURSE PRACTITIONER

## 2023-11-29 LAB — POC BLOOD GLUCOSE (RESULTS)
GLUCOSE, POC: 190 mg/dL — ABNORMAL HIGH (ref 70–100)
GLUCOSE, POC: 266 mg/dL — ABNORMAL HIGH (ref 70–100)
GLUCOSE, POC: 409 mg/dL — ABNORMAL HIGH (ref 70–100)
GLUCOSE, POC: 305 mg/dL — ABNORMAL HIGH (ref 70–100)

## 2023-11-29 MED ORDER — QUETIAPINE 25 MG TABLET
50.0000 mg | ORAL_TABLET | Freq: Once | ORAL | Status: AC
Start: 2023-11-29 — End: 2023-11-29
  Administered 2023-11-29: 50 mg via ORAL
  Filled 2023-11-29: qty 2

## 2023-11-29 MED ORDER — LAMOTRIGINE 25 MG TABLET
25.0000 mg | ORAL_TABLET | Freq: Every day | ORAL | Status: DC
Start: 2023-11-29 — End: 2023-11-30
  Administered 2023-11-29: 0 mg via ORAL
  Filled 2023-11-29 (×2): qty 1

## 2023-11-29 NOTE — Nurses Notes (Signed)
 HS blood glucose was 305

## 2023-11-29 NOTE — Nurses Notes (Signed)
 Administered PRN Albuterol  90 mcg inhaler per pt request for dyspnea.    Administered PRN zyPREXA 5 mg per pt request for agitation.    Follow-up: Pt not reporting any dyspnea or agitation.

## 2023-11-29 NOTE — Progress Notes (Signed)
 [facility]    Inpatient Psychiatric Progress Note    Patient's Full Name: Jennifer [name]   Patient's Date of Birth: 03/31/1976   Patient's Age: 48 y.o.   Patient's Legal Sex: female   Patient's MRN: [id number]   Patient's Date of Admission: 11/23/2023   Current Date: 11/29/2023 07:45     Patient's Room/Bed: PICU02/A       History of Present Illness:48 year old with bipolar disorder (diagnosed at age 1 years old per patient), multiple suicide attempts in the past per patient (she states a lot.. first one when I was 16...  I took a bunch of pills), multiple psychiatric hospitalizations and alcohol abuse (currently drinking half a gallon a day). Admitted for suicide ideation.   Patient is irritable, has poor impulse control. She states People don't understand... I have som much stuff to do in an angry tone of voice. Refused Depakote x2 days. Discontinue and start Lamictal 25 mg daily. Patient says she is not going to take any other medications and she is not going to try Lamictal. Patient minimizes symptoms and denies anxiety, depression, SI, HI and AVH. Refer to SOP. Counselor reports patient's medical appointments were rescheduled including cardiology appointment. She has high risk behaviors including defiant, verbal outbursts, poor compliance with medication and substance abuse. Discharge Friday discussed with patient.   Case discussed with Delon Fell, RN  Review of Systems   Constitutional:  Denies pain.   HENT: Negative.     Eyes: Negative.    Respiratory: Negative.     Cardiovascular: Negative.    Gastrointestinal: Negative.    Genitourinary: Negative.    Musculoskeletal:  denies  myalgias.   Skin: Negative.    Neurological: Negative.    Psychiatric/Behavioral:  Positive for depression, anxiety,agitation       Medications and Allergies:    Current Facility-Administered Medications   Medication Dose Route Frequency    acarbose (PRECOSE) tablet  100 mg Oral 2x/day     albuterol  90 mcg per inhalation oral inhaler - Nursing to administer  2 Puff Inhalation Q6H PRN    aluminum -magnesium  hydroxide-simethicone  (MAG-AL PLUS) 200-200-20 mg per 5 mL oral liquid  30 mL Oral Q4H PRN    aspirin  chewable tablet 81 mg  81 mg Oral Daily    atorvastatin  (LIPITOR) tablet  40 mg Oral QPM    carvedilol  (COREG ) tablet  12.5 mg Oral 2x/day-Food    cephalexin  (KEFLEX ) capsule  500 mg Oral 4x/day    Correction/SSIP insulin  lispro 100 units/mL injection  3-14 Units Subcutaneous 4x/day AC    dapagliflozin (FARXIGA) tablet  10 mg Oral Daily    dextrose  (GLUTOSE) 40% oral gel  15 g Oral Q15 Min PRN    dextrose  50% (0.5 g/mL) injection - syringe  12.5 g Intravenous Q15 Min PRN    divalproex (DEPAKOTE) 12 hr delayed release tablet  250 mg Oral 2x/day    ergocalciferol-vitamin D2 (DRISDOL) capsule  50,000 Units Oral Q7 Days    furosemide  (LASIX ) tablet  20 mg Oral Daily    gabapentin  (NEURONTIN ) tablet  600 mg Oral 3x/day    glimepiride (AMARYL) tablet  4 mg Oral 2X/day    glucagon  injection 1 mg  1 mg IntraMUSCULAR Once PRN    hydroCHLOROthiazide  (HYDRODIURIL ) tablet  25 mg Oral Daily    ibuprofen  (MOTRIN ) tablet  600 mg Oral Q6H PRN    insulin  glargine 100 units/mL injection  18 Units Subcutaneous QPM    LORazepam  (  ATIVAN ) tablet  0.25 mg Oral 2x/day    [START ON 11/30/2023] LORazepam  (ATIVAN ) tablet  0.25 mg Oral NIGHTLY    losartan (COZAAR) tablet  50 mg Oral Daily    magnesium  hydroxide (MILK OF MAGNESIA) 400mg  per 5mL oral liquid  30 mL Oral HS PRN    montelukast  (SINGULAIR ) 10 mg tablet  10 mg Oral QPM    naproxen  (NAPROSYN ) tablet  500 mg Oral 2x/day-Food    OLANZapine (zyPREXA ZYDIS) rapid dissolve tablet  5 mg Oral Q12H PRN    omeprazole (PRILOSEC) capsule  40 mg Oral Daily    paliperidone (INVEGA) extended release tablet  6 mg Oral Daily    ranolazine  (RANEXA ) extended release tablet  500 mg Oral 2x/day    thyroid  (ARMOUR THYROID ) tablet  180 mg Oral Daily    tiotropium bromide (SPIRIVA  RESPIMAT)  2.5 mcg per inhalation oral inhaler - Nursing to administer  2 Puff Inhalation Daily        Allergies[1]       Vital Signs:    Filed Vitals:    11/27/23 1900 11/28/23 0700 11/28/23 1927 11/29/23 0700   BP: 134/76 119/71 (!) 143/93 (!) 102/55   Pulse: 91 86 94 82   Resp: 20 16 18 18    Temp: 36.4 C (97.6 F) 36.6 C (97.9 F) 36.3 C (97.3 F) 36.8 C (98.3 F)   SpO2: 96% 93% 96% 98%        Labs:    Results for orders placed or performed during the hospital encounter of 11/23/23 (from the past 24 hours)   POC BLOOD GLUCOSE (RESULTS)    Collection Time: 11/28/23 10:58 AM   Result Value Ref Range    GLUCOSE, POC 374 (H) 70 - 100 mg/dl   POC BLOOD GLUCOSE (RESULTS)    Collection Time: 11/28/23  4:18 PM   Result Value Ref Range    GLUCOSE, POC 164 (H) 70 - 100 mg/dl   POC BLOOD GLUCOSE (RESULTS)    Collection Time: 11/28/23  7:57 PM   Result Value Ref Range    GLUCOSE, POC 341 (H) 70 - 100 mg/dl   POC BLOOD GLUCOSE (RESULTS)    Collection Time: 11/28/23  9:22 PM   Result Value Ref Range    GLUCOSE, POC 124 (H) 70 - 100 mg/dl   POC BLOOD GLUCOSE (RESULTS)    Collection Time: 11/29/23  7:15 AM   Result Value Ref Range    GLUCOSE, POC 190 (H) 70 - 100 mg/dl        Mental Status Examination:  Sensorium/Alertness: Alert,   Orientation: Date, Person, Place, Situation  Appearance:Appears stated age,   Psychomotor Activity: Normal,   Abnormal Behaviors: None,   Attitude Towards Examiner: Cooperative,  Eye Contact: Normal,   Speech: Normal/Spontaneous,    Mood: Anxious, Depressed,   Affect: irritable   Perception: WNL  Though Process: Logical/Clear/Goal Oriented,  Thought Content: Suicidal? no    Thought Content: Homicidal? no  Thought Content: Delusions? no  Impulse Control:  Grossly Intact,   Concentration/Calculation/Attention Span: poor   How was the patient's Concentration/Calculation/Attention tested/assessed? Per observation and interview with patient   Recent Memory: WNL,  Remote Memory: WNL,   How was the patient's  Remote Memory Tested/Assessed? Past Events, as it relates to history  Intelligence/Fund of Knowledge: Average  How was the patient's Intelligence/Fund of Knowledge Tested/Assessed? Based on history, Based on vocabulary, syntax, grammar, and content  Judgement: Fair,   How was the patient's Judgement Tested/Assessed?  Per patient's behavior/history of present illness  Insight: Fair,   How was the patient's Insight Tested/Assessed? 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 0 Homelessness        Differential Diagnoses:  Borderline personality disorder   Alcohol abuse induced mood disorder   THC abuse induced mood disorder     .DATE OF SESSION: .11/29/23  MINUTES SPENT WITH SUPPORTIVE THERAPY NOT INCLUDING MED MANAGEMENT:  16 minutes  MODALITY: supportive interaction with focus on behavior modification  GOALS: target of admission s/s, as well as current psychiatric sings and symptoms / see HPI/MSE  FOCUS: supportive therapy, identification of stressors/triggers that can lead to relapse and/or exacerbation of psychiatric symptoms, working on insight of substance abuse and mental illness as well as s/s, identification of coping mechanisms to help prevent relapse and/or exacerbation of mental illness. patient identifies some stressors that was causing his flashbacks and nightmares.  agreeable to continue in group session in order to help identify coping mechanisms  PATIENT PROGRESS: slow positive progress noted. ongoing/additional psychotherapy sessions necessary as patient w/significant decompensating symptoms (see admission s/s/data) requiring inpatient psychiatric services. ongoing psychotherapy vital for short term  and long term  goals in reducing, stabilizing,  as well as further maintenance of patient's  mental illnesses and substance abuse.  FUNCTIONAL STATUS: awake, alert, oriented  NEXT SESSION: every 3-4 days and as needed    Treatment and Medication Recommendation:  -The patient is admitted for safety and stabilization.  Medicines will be instituted and adjusted as indicated.  We have discussed medicine side-effects, treatment options, and pregnancy precautions if applicable.  The patient agrees with the plan and will be encouraged to be active in groups and milieu treatment. Patient has been instructed of the unit rules and regulations and will be seen by the hospitalist for medical issues. Further changes will be dictated by clinical course.  I estimate patient length of stay will be 7-14 days   -behavioral health safety checks   -regular diet, full code  -provisional discharge disposition: home Friday 12/01/23, patient signed an intent to leave 11/27/23    11/29/23 Discontinue and start Lamictal 25 mg daily for mood. Counselor reports patient's medical appointments were rescheduled including cardiology appointment. She has high risk behaviors including defiant, verbal outbursts, poor compliance with medication and substance abuse. Discharge Friday discussed with patient.   11/28/23 Patient moved to PICU yesterday due to agitation. Due to multiple suicide attempts in the past patient will benefit from staying here until intent to leave is due on Friday 12/01/23.  Increase Invega PO from 3 mg to 6 mg daily for mood. Start Depakote 250 mg PO BID for mood. Start PRN Zyprexa 5 mg PO every 12 hrs PRN agitation.   11/27/23 Move patient to PICU.   11/24/23 Peer recovery coach consult for alcohol abuse and THC  Start Ativan  taper for alcohol withdrawal   Start Invega 3 mg Po daily for bipolar   She is on Keflex  for UTI, medical will review   Decrease Gabapentin  from 800 mg TID to 300 mg TID for 4 days then discontinue. Gabapentin  is not allowed in rehab. Patient has alcohol abuse.   Discontinue Vistaril 25 mg q 8 hrs PRN  anxiety. Patient does not want Vistaril.   Decrease Cymbalta from  30 mg daily to 20 mg daily for depression, she is bipolar   Do not continue with acarbose, currently drinking heavily   Discontinue Abilify   Discontinue PRN Trazodone  due to bipolar disorder       I certify that the inpatient psychiatric admission continues to be medically necessary for (1) treatment which is reasonably expected to improve the patient's condition, and/or (2) for diagnostic studies    Interaction Attestation: Clinical telemedicine services delivered using HIPAA-compliant interactive video-audio telecommunications while the patient and the rendering provider were not in the same physical location. Patient agreeable to telecommunication.    TELEMEDICINE DOCUMENTATION:    Patient Location:  [facility], [address] [place], [place] [address]  Provider Location: Remote  Patient/family aware of provider location:  yes  Patient/family consent for telemedicine:  yes  Examination observed and performed by:.Ezella Sanders, PMHNP         [1]   Allergies  Allergen Reactions    Aspirin       Unknown Reaction during childhood    Tomato     Tylenol  [Acetaminophen ]  Other Adverse Reaction (Add comment)     Fatty liver

## 2023-11-29 NOTE — Group Note (Signed)
 [facility] MEDICINE [facility], INTENSIVE CARE BEHAVIORAL MEDICINE  [address]  [place] [address]    Group Note             Name: JADALEE WESTCOTT   Date of Birth: 25-May-1976   Today's Date: 11/28/2023   Group Start Time: 1400   Group End Time: 1430   Group Topic: RECREATION GROUP  Number of participants: 2      Summary of group discussion:   Group games to promote appropriate other interaction and decrease potential self imposed isolation tendencies.    Trea's Participation and Response: Pt did not attend.      Suicidal/Homicidal Risk:  Currently denies SI/HI and expresses willingness to contact crisis services if needed.

## 2023-11-29 NOTE — Group Note (Signed)
 [facility] MEDICINE [facility], INTENSIVE CARE BEHAVIORAL MEDICINE  [address]  [place] [address]    Group Note             Name: SHAQUISHA WYNN   Date of Birth: 01-08-1976   Today's Date: 11/29/2023   Group Start Time: 1400   Group End Time: 1430   Group Topic: RECREATION GROUP  Number of participants: 2      Summary of group discussion:   Group games to promote appropriate other interaction and decrease potential self i mposed isolation tendencies.    Jenalyn's Participation and Response: Pt did not attend.      Suicidal/Homicidal Risk:  Currently denies SI/HI and expresses willingness to contact crisis services if needed.

## 2023-11-29 NOTE — Nurses Notes (Signed)
 Shift note. Patient has been dressed and up ad lib on the unit with a steady gait. Calm, cooperative. PO medication taken and tolerated well except for Lamictal, which patient has refused. She states this medication has not been effective in the past and she does not want to try it now. Denies SI/HI and denies any AV hallucinations. Denies depression and rates anxiety at 9 when thinking about seeing her telehealth provider. Last BM was today. All meals, drinks, snacks provided and encouraged. Encouraged to shower with provided hygiene products. Encouraged to be up on the unit for groups, activities, and socialization. No other concerns/needs expressed at this time. Plan of care ongoing.

## 2023-11-29 NOTE — Nurses Notes (Signed)
 1900-0700 Nursing Shift Note: Pt was sitting in the dayroom coloring most of the evening until ~2230. When prompted how the pt was doing, she expressed frustration with the quality of care she was receiving at this facility, and voiced I want to be at home with my family. The pt also stated she was really happy, but that she felt that no one was really listening to her here. Pt had a lot of notes she was taking about her care, and asked questions about the medications she was receiving during med pass. Pt was cooperate and med compliant, but seemed very easily agitated when discussing her quality of care, stating that no one is willing to sit with the patients and talk to them about how they are doing. Pt denies any SI, HI, as well as denies depression and hallucinations.     Staff will continue to monitor the pt Q15 minutes and PRN.

## 2023-11-29 NOTE — Group Note (Signed)
 Group topic:  COMMUNITY MEETING GROUP    Date of group:  11/29/2023  Start time of group:  1300  End time of group:  1315    Attend:  [x]   Not attend: []   Attendance:  inpatient attended all of group                 Summary of group discussion:UNIT RULES      Jennifer [name]  is a 48 y.o. female participating in a community meeting group.    Patient observations:      Patient goals:      Ladora Shed, PAT CARE [facility]  11/29/2023, 16:14

## 2023-11-29 NOTE — Group Note (Signed)
 [facility] MEDICINE [facility], INTENSIVE CARE BEHAVIORAL MEDICINE  [address]  [place] [address]    Group Note             Name: Jennifer [name]   Date of Birth: 1975-10-22   Today's Date: 11/29/2023   Group Start Time: 1000   Group End Time: 1100   Group Topic: ACTIVITY GROUP  Number of participants: 0      Summary of group discussion:   Creative expression art opportunity.    Jennifer [name]'s Participation and Response: Pt did not attend.      Suicidal/Homicidal Risk:  Currently denies SI/HI and expresses willingness to contact crisis services if needed.

## 2023-11-29 NOTE — Care Plan (Signed)
 Problem: Adult Behavioral Health Plan of Care  Goal: Plan of Care Review  Outcome: Ongoing (see interventions/notes)  Goal: Patient-Specific Goal (Individualization)  Outcome: Ongoing (see interventions/notes)  Goal: Strengths and Vulnerabilities  Outcome: Ongoing (see interventions/notes)  Goal: Adheres to Safety Considerations for Self and Others  Outcome: Ongoing (see interventions/notes)  Goal: Absence of New-Onset Illness or Injury  Outcome: Ongoing (see interventions/notes)  Intervention: Identify and Manage Fall Risk  Recent Flowsheet Documentation  Taken 11/29/2023 1108 by Osa KIDD, RN  Safety Promotion/Fall Prevention:   nonskid shoes/slippers when out of bed   safety round/check completed  Goal: Optimized Coping Skills in Response to Life Stressors  Outcome: Ongoing (see interventions/notes)  Goal: Develops/Participates in Therapeutic Alliance to Support Successful Transition  Outcome: Ongoing (see interventions/notes)  Goal: Rounds/Family Conference  Outcome: Ongoing (see interventions/notes)

## 2023-11-29 NOTE — Nurses Notes (Signed)
 2105 Patient requesting seroquel per picu nurse. Called on call provider, heather warren. She ordered a one time dose and to notify morning shift to be added to her list.

## 2023-11-29 NOTE — Care Plan (Signed)
 Patient's plan of care is ongoing and progressing toward goals. Patient is not compliant with medications that she doesn't want to take. Patient attempts to manipulate situations with staff. Denies SI/HI.  Problem: Adult Behavioral Health Plan of Care  Goal: Plan of Care Review  Outcome: Ongoing (see interventions/notes)  Goal: Patient-Specific Goal (Individualization)  Outcome: Ongoing (see interventions/notes)  Goal: Adheres to Safety Considerations for Self and Others  Outcome: Ongoing (see interventions/notes)  Goal: Absence of New-Onset Illness or Injury  Outcome: Ongoing (see interventions/notes)  Goal: Optimized Coping Skills in Response to Life Stressors  Outcome: Ongoing (see interventions/notes)  Goal: Develops/Participates in Therapeutic Alliance to Support Successful Transition  Outcome: Ongoing (see interventions/notes)  Goal: Rounds/Family Conference  Outcome: Ongoing (see interventions/notes)     Problem: Health Knowledge, Opportunity to Enhance (Adult,Obstetrics,Pediatric)  Goal: Knowledgeable about Health Subject/Topic  Description: Patient will demonstrate the desired outcomes by discharge/transition of care.  Outcome: Ongoing (see interventions/notes)     Problem: Suicide Risk  Goal: Absence of Self-Harm  Outcome: Ongoing (see interventions/notes)     Problem: Anxiety Signs/Symptoms  Goal: Optimized Energy Level (Anxiety Signs/Symptoms)  Outcome: Ongoing (see interventions/notes)  Goal: Optimized Cognitive Function (Anxiety Signs/Symptoms)  Outcome: Ongoing (see interventions/notes)  Goal: Improved Mood Symptoms (Anxiety Signs/Symptoms)  Outcome: Ongoing (see interventions/notes)  Goal: Improved Sleep (Anxiety Signs/Symptoms)  Outcome: Ongoing (see interventions/notes)  Goal: Enhanced Social, Occupational or Functional Skills (Anxiety Signs/Symptoms)  Outcome: Ongoing (see interventions/notes)  Goal: Improved Somatic Symptoms (Anxiety Signs/Symptoms)  Outcome: Ongoing (see interventions/notes)      Problem: Depressive Signs/Symptoms  Goal: Optimized Energy Level (Depressive Signs/Symptoms)  Outcome: Ongoing (see interventions/notes)  Goal: Optimized Cognitive Function (Depressive Signs/Symptoms)  Outcome: Ongoing (see interventions/notes)  Goal: Increased Participation and Engagement (Depressive Signs/Symptoms)  Outcome: Ongoing (see interventions/notes)  Goal: Enhanced Self-Esteem and Confidence (Depressive Signs/Symptoms)  Outcome: Ongoing (see interventions/notes)  Goal: Improved Mood Symptoms (Depressive Signs/Symptoms)  Outcome: Ongoing (see interventions/notes)  Goal: Optimized Nutrition Intake (Depressive Signs/Symptoms)  Outcome: Ongoing (see interventions/notes)  Goal: Improved Psychomotor Symptoms (Depressive Signs/Symptoms)  Outcome: Ongoing (see interventions/notes)  Goal: Improved Sleep (Depressive Signs/Symptoms)  Outcome: Ongoing (see interventions/notes)  Goal: Enhanced Social, Occupational or Functional Skills (Depressive Signs/Symptoms)  Outcome: Ongoing (see interventions/notes)     Problem: Adult Treatment Plan  Goal: Patient will meet daily with attending physician.  Description: Physicians will evaluate patient, discuss diagnosis, prescribe medications and order diagnostic testing as clinically indicated. Physicians will monitor safety risks and assess when appropriate for discharge.  Outcome: Ongoing (see interventions/notes)  Goal: Patient will complete psychosocial history assessment within 60 hours of admission with Clinical Therapist.  Outcome: Ongoing (see interventions/notes)  Goal: Patient will participate in 100% of therapy groups facilitated by Clinical Therapist.  Description: Topics will include Dialectical Behavior Therapy with the components of Mindfulness, Distress Tolerance, Emotion Regulation and Crisis Survival Skills, as well as Stress Management skills.  Outcome: Ongoing (see interventions/notes)

## 2023-11-30 LAB — POC BLOOD GLUCOSE (RESULTS)
GLUCOSE, POC: 207 mg/dL — ABNORMAL HIGH (ref 70–100)
GLUCOSE, POC: 185 mg/dL — ABNORMAL HIGH (ref 70–100)
GLUCOSE, POC: 282 mg/dL — ABNORMAL HIGH (ref 70–100)

## 2023-11-30 MED ORDER — PALIPERIDONE ER 6 MG TABLET,EXTENDED RELEASE 24 HR
6.0000 mg | EXTENDED_RELEASE_TABLET | Freq: Every day | ORAL | 0 refills | Status: AC
Start: 2023-11-30 — End: 2023-12-14

## 2023-11-30 MED ORDER — QUETIAPINE 25 MG TABLET
50.0000 mg | ORAL_TABLET | Freq: Once | ORAL | Status: AC
Start: 2023-11-30 — End: 2023-11-30
  Administered 2023-11-30: 50 mg via ORAL
  Filled 2023-11-30: qty 2

## 2023-11-30 NOTE — Care Plan (Signed)
 Pt is med compliant and cooperative.  Problem: Adult Behavioral Health Plan of Care  Goal: Plan of Care Review  Outcome: Ongoing (see interventions/notes)  Flowsheets (Taken 11/29/2023 2054)  Patient Agreement with Plan of Care: (pt does not like the quality of care provided) agrees with comment (describe)  Goal: Patient-Specific Goal (Individualization)  Outcome: Ongoing (see interventions/notes)  Flowsheets (Taken 11/29/2023 2054)  Individualized Care Needs: Alcohol Abstinence  Anxieties, Fears or Concerns: Anxiety:6 r/t seeing boyfriend after discharge  Goal: Absence of New-Onset Illness or Injury  Outcome: Ongoing (see interventions/notes)  Intervention: Identify and Manage Fall Risk  Recent Flowsheet Documentation  Taken 11/29/2023 2054 by Inge BROCKS, GN  Safety Promotion/Fall Prevention:   nonskid shoes/slippers when out of bed   safety round/check completed  Goal: Optimized Coping Skills in Response to Life Stressors  Outcome: Ongoing (see interventions/notes)  Goal: Develops/Participates in Therapeutic Alliance to Support Successful Transition  Outcome: Ongoing (see interventions/notes)  Intervention: Jerrye Donnamaria Junker  Recent Flowsheet Documentation  Taken 11/29/2023 2054 by Inge BROCKS, GN  Trust Relationship/Rapport:   emotional support provided   empathic listening provided   questions answered   questions encouraged   reassurance provided   thoughts/feelings acknowledged     Problem: Health Knowledge, Opportunity to Enhance (Adult,Obstetrics,Pediatric)  Goal: Knowledgeable about Health Subject/Topic  Description: Patient will demonstrate the desired outcomes by discharge/transition of care.  Outcome: Ongoing (see interventions/notes)     Problem: Suicide Risk  Goal: Absence of Self-Harm  Outcome: Ongoing (see interventions/notes)  Intervention: Promote Psychosocial Wellbeing  Recent Flowsheet Documentation  Taken 11/29/2023 2054 by Inge BROCKS, GN  Sleep/Rest Enhancement:   noise level reduced   regular  sleep/rest pattern promoted   room darkened     Problem: Anxiety Signs/Symptoms  Goal: Optimized Energy Level (Anxiety Signs/Symptoms)  Outcome: Ongoing (see interventions/notes)  Intervention: Optimize Energy Level  Recent Flowsheet Documentation  Taken 11/29/2023 2054 by Inge BROCKS, GN  Activity (Behavioral Health): up ad lib  Diversional Activity:   journaling   art work   music   television  Goal: Optimized Cognitive Function (Anxiety Signs/Symptoms)  Outcome: Ongoing (see interventions/notes)  Intervention: Support and Promote Cognitive Ability  Recent Flowsheet Documentation  Taken 11/29/2023 2054 by Inge BROCKS, GN  Communication Support Strategies:   active listening utilized   extra time allowed for response  Goal: Improved Mood Symptoms (Anxiety Signs/Symptoms)  Outcome: Ongoing (see interventions/notes)  Goal: Improved Sleep (Anxiety Signs/Symptoms)  Outcome: Ongoing (see interventions/notes)  Intervention: Promote Healthy Sleep Hygiene  Recent Flowsheet Documentation  Taken 11/29/2023 2054 by Inge BROCKS, GN  Sleep Hygiene Promotion:   regular sleep pattern promoted   room lighting adjusted  Goal: Enhanced Social, Occupational or Functional Skills (Anxiety Signs/Symptoms)  Outcome: Ongoing (see interventions/notes)  Intervention: Promote Social, Occupational and Functional Ability  Recent Flowsheet Documentation  Taken 11/29/2023 2054 by Inge BROCKS, GN  Trust Relationship/Rapport:   emotional support provided   empathic listening provided   questions answered   questions encouraged   reassurance provided   thoughts/feelings acknowledged  Goal: Improved Somatic Symptoms (Anxiety Signs/Symptoms)  Outcome: Ongoing (see interventions/notes)     Problem: Depressive Signs/Symptoms  Goal: Optimized Energy Level (Depressive Signs/Symptoms)  Outcome: Ongoing (see interventions/notes)  Intervention: Optimize Energy Level  Recent Flowsheet Documentation  Taken 11/29/2023 2054 by Inge BROCKS, GN  Activity (Behavioral Health): up  ad lib  Diversional Activity:   journaling   art work   music   television  Goal: Optimized Cognitive Function (Depressive Signs/Symptoms)  Outcome: Ongoing (see interventions/notes)  Intervention: Support and Promote Cognitive Ability  Recent Flowsheet Documentation  Taken 11/29/2023 2054 by Inge BROCKS, GN  Communication Support Strategies:   active listening utilized   extra time allowed for response  Goal: Increased Participation and Engagement (Depressive Signs/Symptoms)  Outcome: Ongoing (see interventions/notes)  Intervention: Facilitate Participation and Engagement  Recent Flowsheet Documentation  Taken 11/29/2023 2054 by Inge BROCKS, GN  Diversional Activity:   journaling   art work   music   television  Goal: Enhanced Self-Esteem and Confidence (Depressive Signs/Symptoms)  Outcome: Ongoing (see interventions/notes)  Intervention: Promote Confidence and Self-Esteem  Recent Flowsheet Documentation  Taken 11/29/2023 2054 by Inge BROCKS, GN  Diversional Activity:   journaling   art work   music   television  Goal: Improved Mood Symptoms (Depressive Signs/Symptoms)  Outcome: Ongoing (see interventions/notes)  Intervention: Promote Mood Improvement  Recent Flowsheet Documentation  Taken 11/29/2023 2054 by Inge BROCKS, GN  Diversional Activity:   journaling   art work   music   television  Goal: Optimized Nutrition Intake (Depressive Signs/Symptoms)  Outcome: Ongoing (see interventions/notes)  Goal: Improved Psychomotor Symptoms (Depressive Signs/Symptoms)  Outcome: Ongoing (see interventions/notes)  Intervention: Manage Psychomotor Movement  Recent Flowsheet Documentation  Taken 11/29/2023 2054 by Inge BROCKS, GN  Activity (Behavioral Health): up ad lib  Diversional Activity:   journaling   art work   music   television  Goal: Improved Sleep (Depressive Signs/Symptoms)  Outcome: Ongoing (see interventions/notes)  Intervention: Promote Healthy Sleep Hygiene  Recent Flowsheet Documentation  Taken 11/29/2023 2054 by Inge BROCKS,  GN  Sleep Hygiene Promotion:   regular sleep pattern promoted   room lighting adjusted  Goal: Enhanced Social, Occupational or Functional Skills (Depressive Signs/Symptoms)  Outcome: Ongoing (see interventions/notes)     Problem: Adult Treatment Plan  Goal: Patient will meet daily with attending physician.  Description: Physicians will evaluate patient, discuss diagnosis, prescribe medications and order diagnostic testing as clinically indicated. Physicians will monitor safety risks and assess when appropriate for discharge.  Outcome: Ongoing (see interventions/notes)  Goal: Patient will complete psychosocial history assessment within 60 hours of admission with Clinical Therapist.  Outcome: Ongoing (see interventions/notes)  Goal: Patient will participate in 100% of therapy groups facilitated by Clinical Therapist.  Description: Topics will include Dialectical Behavior Therapy with the components of Mindfulness, Distress Tolerance, Emotion Regulation and Crisis Survival Skills, as well as Stress Management skills.  Outcome: Ongoing (see interventions/notes)

## 2023-11-30 NOTE — Nurses Notes (Signed)
 Administered PRN albuterol  90 mcg per patient request for dyspnea.    Administered PRN MOTRIN  600 mg per patient request for fibromyalgia pain of 8/10.    Administered PRN zyPREXA 5 mg per patient request for agitation.      Follow-up: Pt is resting peacefully in their room.

## 2023-11-30 NOTE — Group Note (Signed)
 Group topic:  COMMUNITY MEETING GROUP    Date of group:  11/30/2023  Start time of group:  1400  End time of group:  1410    Attend:  []   Not attend: [x]   Attendance:  inpatient attended 00 minutes                 Summary of group discussion:Community Meeting      Jennifer [name]  is a 48 y.o. female participating in a community meeting group.    Patient observations:      Patient goals:      [name], PAT CARE [facility]  11/30/2023, 14:10

## 2023-11-30 NOTE — Behavioral Health (Signed)
 The counselor called the nurses on PICU and asked them to let the patient call the unemployment office as they told her to call today for her benefits Thursday 11/30/23 as they were closed on the 4th. .     The nurse came back and let the counselor know that they let her call and the recording stated they were closed on both the 4th and the 3rd.     The counselor stated that they told her to call today and the nurse said that is what the patient said as well.    The counselor will continue to work on care coordination as needed.

## 2023-11-30 NOTE — Progress Notes (Signed)
 [facility]    Inpatient Psychiatric Progress Note    Patient's Full Name: Jennifer [name]   Patient's Date of Birth: 1975-07-25   Patient's Age: 48 y.o.   Patient's Legal Sex: female   Patient's MRN: [id number]   Patient's Date of Admission: 11/23/2023   Current Date: 11/30/2023 07:11     Patient's Room/Bed: PICU02/A       History of Present Illness:48 year old with bipolar disorder (diagnosed at age 89 years old per patient), multiple suicide attempts in the past per patient (she states a lot.. first one when I was 16...  I took a bunch of pills), multiple psychiatric hospitalizations and alcohol abuse (currently drinking half a gallon a day). Admitted for suicide ideation.   Patient on the phone with her daughter. Patient refused Lamictal,I will discontinue it. She minimizes symptoms. She says she has not had cravings for alcohol the last two days. Patient denies anxiety, depression, SI, HI and AVH. She states For a couple days I have not been depression... I am feeling happy today. Counselor reports patient will start the SOP program here at [facility] on Tuesday 12/05/23 at 9:30 am. She will be discharged tomorrow.   Case discussed with Delon Fell, RN  Review of Systems   Constitutional:  Denies pain.   HENT: Negative.     Eyes: Negative.    Respiratory: Negative.     Cardiovascular: Negative.    Gastrointestinal: Negative.    Genitourinary: Negative.    Musculoskeletal:  denies  myalgias.   Skin: Negative.    Neurological: Negative.    Psychiatric/Behavioral:  Positive for depression, anxiety,agitation       Medications and Allergies:    Current Facility-Administered Medications   Medication Dose Route Frequency    acarbose (PRECOSE) tablet  100 mg Oral 2x/day    albuterol  90 mcg per inhalation oral inhaler - Nursing to administer  2 Puff Inhalation Q6H PRN    aluminum -magnesium  hydroxide-simethicone  (MAG-AL PLUS) 200-200-20 mg per 5 mL oral liquid  30 mL Oral Q4H PRN     aspirin  chewable tablet 81 mg  81 mg Oral Daily    atorvastatin  (LIPITOR) tablet  40 mg Oral QPM    carvedilol  (COREG ) tablet  12.5 mg Oral 2x/day-Food    Correction/SSIP insulin  lispro 100 units/mL injection  3-14 Units Subcutaneous 4x/day AC    dapagliflozin (FARXIGA) tablet  10 mg Oral Daily    dextrose  (GLUTOSE) 40% oral gel  15 g Oral Q15 Min PRN    dextrose  50% (0.5 g/mL) injection - syringe  12.5 g Intravenous Q15 Min PRN    ergocalciferol-vitamin D2 (DRISDOL) capsule  50,000 Units Oral Q7 Days    furosemide  (LASIX ) tablet  20 mg Oral Daily    gabapentin  (NEURONTIN ) tablet  600 mg Oral 3x/day    glimepiride (AMARYL) tablet  4 mg Oral 2X/day    glucagon  injection 1 mg  1 mg IntraMUSCULAR Once PRN    hydroCHLOROthiazide  (HYDRODIURIL ) tablet  25 mg Oral Daily    ibuprofen  (MOTRIN ) tablet  600 mg Oral Q6H PRN    insulin  glargine 100 units/mL injection  18 Units Subcutaneous QPM    lamoTRIgine (LAMICTAL) tablet  25 mg Oral Daily    LORazepam  (ATIVAN ) tablet  0.25 mg Oral NIGHTLY    losartan (COZAAR) tablet  50 mg Oral Daily    magnesium  hydroxide (MILK OF MAGNESIA) 400mg  per 5mL oral liquid  30 mL Oral HS PRN  montelukast  (SINGULAIR ) 10 mg tablet  10 mg Oral QPM    naproxen  (NAPROSYN ) tablet  500 mg Oral 2x/day-Food    OLANZapine (zyPREXA ZYDIS) rapid dissolve tablet  5 mg Oral Q12H PRN    omeprazole (PRILOSEC) capsule  40 mg Oral Daily    paliperidone (INVEGA) extended release tablet  6 mg Oral Daily    ranolazine  (RANEXA ) extended release tablet  500 mg Oral 2x/day    thyroid  (ARMOUR THYROID ) tablet  180 mg Oral Daily    tiotropium bromide (SPIRIVA  RESPIMAT) 2.5 mcg per inhalation oral inhaler - Nursing to administer  2 Puff Inhalation Daily        Allergies[1]       Vital Signs:    Filed Vitals:    11/28/23 0700 11/28/23 1927 11/29/23 0700 11/29/23 1900   BP: 119/71 (!) 143/93 (!) 102/55 122/76   Pulse: 86 94 82 (!) 101   Resp: 16 18 18 17    Temp: 36.6 C (97.9 F) 36.3 C (97.3 F) 36.8 C (98.3 F) 36.9  C (98.5 F)   SpO2: 93% 96% 98% 97%        Labs:    Results for orders placed or performed during the hospital encounter of 11/23/23 (from the past 24 hours)   POC BLOOD GLUCOSE (RESULTS)    Collection Time: 11/29/23  7:15 AM   Result Value Ref Range    GLUCOSE, POC 190 (H) 70 - 100 mg/dl   POC BLOOD GLUCOSE (RESULTS)    Collection Time: 11/29/23 10:22 AM   Result Value Ref Range    GLUCOSE, POC 266 (H) 70 - 100 mg/dl   POC BLOOD GLUCOSE (RESULTS)    Collection Time: 11/29/23  3:58 PM   Result Value Ref Range    GLUCOSE, POC 409 (H) 70 - 100 mg/dl   POC BLOOD GLUCOSE (RESULTS)    Collection Time: 11/29/23  9:15 PM   Result Value Ref Range    GLUCOSE, POC 305 (H) 70 - 100 mg/dl        Mental Status Examination:  Sensorium/Alertness: Alert,   Orientation: Date, Person, Place, Situation  Appearance:Appears stated age,   Psychomotor Activity: Normal,   Abnormal Behaviors: None,   Attitude Towards Examiner: Cooperative,  Eye Contact: Normal,   Speech: Normal/Spontaneous,    Mood: Anxious, Depressed,   Affect: irritable   Perception: WNL  Though Process: Logical/Clear/Goal Oriented,  Thought Content: Suicidal? no    Thought Content: Homicidal? no  Thought Content: Delusions? no  Impulse Control:  Grossly Intact,   Concentration/Calculation/Attention Span: poor   How was the patient's Concentration/Calculation/Attention tested/assessed? Per observation and interview with patient   Recent Memory: WNL,  Remote Memory: WNL,   How was the patient's Remote Memory Tested/Assessed? Past Events, as it relates to history  Intelligence/Fund of Knowledge: Average  How was the patient's Intelligence/Fund of Knowledge Tested/Assessed? Based on history, Based on vocabulary, syntax, grammar, and content  Judgement: Fair,   How was the patient's Judgement Tested/Assessed? Per patient's behavior/history of present illness  Insight: Fair,   How was the patient's Insight Tested/Assessed? 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 0 Homelessness        Differential Diagnoses:  Borderline personality disorder   Alcohol abuse induced mood disorder  THC abuse induced mood disorder       Treatment and Medication Recommendation:  -The patient is admitted for safety and stabilization.  Medicines will be instituted and adjusted as indicated.  We have discussed medicine side-effects, treatment options, and pregnancy precautions if applicable.  The patient agrees with the plan and will be encouraged to be active in groups and milieu treatment. Patient has been instructed of the unit rules and regulations and will be seen by the hospitalist for medical issues. Further changes will be dictated by clinical course.  I estimate patient length of stay will be 7-14 days   -behavioral health safety checks   -regular diet, full code  -provisional discharge disposition: home Friday 12/01/23, patient signed an intent to leave 11/27/23    11/30/23 Monitor effectiveness of medication change done yesterday. Discharge tomorrow. ITL is due. Counselor reports patient will start the SOP program here at [facility] on Tuesday 12/05/23 at 9:30 am  11/29/23 Discontinue and start Lamictal 25 mg daily for mood. Counselor reports patient's medical appointments were rescheduled including cardiology appointment. She has high risk behaviors including defiant, verbal outbursts, poor compliance with medication and substance abuse. Discharge Friday discussed with patient.   11/28/23 Patient moved to PICU yesterday due to agitation. Due to multiple suicide attempts in the past patient will benefit from staying here until intent to leave is due on Friday 12/01/23.  Increase Invega PO from 3 mg to 6 mg daily for mood. Start Depakote 250 mg PO BID for mood. Start PRN Zyprexa 5 mg PO every 12  hrs PRN agitation.   11/27/23 Move patient to PICU.   11/24/23 Peer recovery coach consult for alcohol abuse and THC  Start Ativan  taper for alcohol withdrawal   Start Invega 3 mg Po daily for bipolar   She is on Keflex  for UTI, medical will review   Decrease Gabapentin  from 800 mg TID to 300 mg TID for 4 days then discontinue. Gabapentin  is not allowed in rehab. Patient has alcohol abuse.   Discontinue Vistaril 25 mg q 8 hrs PRN anxiety. Patient does not want Vistaril.   Decrease Cymbalta from 30 mg daily to 20 mg daily for depression, she is bipolar   Do not continue with acarbose, currently drinking heavily   Discontinue Abilify   Discontinue PRN Trazodone  due to bipolar disorder       I certify that the inpatient psychiatric admission continues to be medically necessary for (1) treatment which is reasonably expected to improve the patient's condition, and/or (2) for diagnostic studies    Interaction Attestation: Clinical telemedicine services delivered using HIPAA-compliant interactive video-audio telecommunications while the patient and the rendering provider were not in the same physical location. Patient agreeable to telecommunication.    TELEMEDICINE DOCUMENTATION:    Patient Location:  [facility], [address] [place], [place] [address]  Provider Location: Remote  Patient/family aware of provider location:  yes  Patient/family consent for telemedicine:  yes  Examination observed and performed by:.Ezella Sanders, PMHNP         [1]   Allergies  Allergen Reactions    Aspirin       Unknown Reaction during childhood    Tomato     Tylenol  [Acetaminophen ]  Other Adverse Reaction (Add comment)     Fatty liver

## 2023-11-30 NOTE — Discharge Summary (Signed)
 [facility]     Discharge Summary    Patient's Full Name: Jennifer [name]   Patient's Date of Birth: 1975/06/13   Patient's Age: 48 y.o.   Patient's Legal Sex: female   Patient's MRN: [id number]   Patient's Date of Admission: 11/23/2023   Current Date: 11/30/2023 07:14     Patient's Room/Bed: PICU02/A     DISCHARGE DATE AND TIME: 12/01/23      ADMITTING PHYSICIAN: Dr. Quin    REASON FOR HOSPITALIZATION:suicide ideation    HPI on admission:48 year old with bipolar disorder (diagnosed at age 4 years old per patient), multiple suicide attempts in the past per patient (she states a lot.. first one when I was 16...  I took a bunch of pills), multiple psychiatric hospitalizations and alcohol abuse (currently drinking half a gallon a day). Admitted for suicide ideation. Today she is alert and oriented x3.   She says she can't talk well due to Trazodone  given last night. She states that Trazodone .. I don't like it. She say she has multiple stressors. She states The power is out in my house.... I am going through a lot of stuff in my health... I don't know what to do..  I said I felt hopeless.. I don't know if it's the same definition as suicidal.. I don't have a plan.. I feel hopeless.. I need help.She rates depression and anxiety 10/10. She states I'm manic depressive... I do have a great support system.. I do think the world is against me but hallucinating.. no... I don think people think like I do... my mind is always going I can't make it sit still... right now I am thinking how to break out to go get a glass of wine.. that is all I am thinking about.SABRA getting out to go drink.     HOSPITAL COURSE: Patient was admitted to the hospital for stabilization of admission symptoms.  Medicines were instituted and adjusted.  During the stay, the patient refused to try any antipsychotic or mood stabilizer.  The patient was enrolled in groups and milieu treatment.  We worked on Pharmacologist,  stress management techniques, and the need for compliance, sobriety, and taking medications appropriately.  Medication side effects were discussed as well.  During the stay, the patient  was moved to PICU for agitation.   The patient has improved significantly over the course of their stay, and is appropriate for step down to a less restrictive level of care.  The patient agrees with the plan and at the time of departure is not suicidal, homicidal, or psychotic.I feel that the patient has maximized benefit from inpatient treatment.  Patient provided crisis numbers to call should symptoms worsen.     Current Discharge Medication List        START taking these medications.        Details   paliperidone 6 mg Tablet Extended Rel 24 hr  Commonly known as: INVEGA   6 mg, Oral, Daily  Qty: 14 Tablet  Refills: 0            CONTINUE these medications - NO CHANGES were made during your visit.        Details   acarbose 100 mg Tablet  Commonly known as: PRECOSE   100 mg, 2 TIMES DAILY  Refills: 0     albuterol  sulfate 90 mcg/actuation oral inhaler  Commonly known as: PROVENTIL  or VENTOLIN  or PROAIR    1-2 Puffs, Inhalation, EVERY 6  HOURS PRN  Qty: 18 g  Refills: 2     aspirin  81 mg Tablet, Delayed Release (E.C.)  Commonly known as: ECOTRIN   81 mg, Oral, Daily  Refills: 0     atorvastatin  40 mg Tablet  Commonly known as: LIPITOR   40 mg, Oral, EVERY EVENING  Qty: 90 Tablet  Refills: 3     carvediloL  12.5 mg Tablet  Commonly known as: COREG    12.5 mg, Oral, 2 TIMES DAILY WITH FOOD  Qty: 180 Tablet  Refills: 3     Dulera  100-5 mcg/actuation HFA Aerosol Inhaler  Generic drug: mometasone-formoterol    2 Puffs, Inhalation, 2 TIMES DAILY, Rinse mouth after each use of inhaled steroid.  Qty: 13 g  Refills: 5     empagliflozin  25 mg Tablet  Commonly known as: JARDIANCE    25 mg, Oral, Daily  Qty: 90 Tablet  Refills: 4     ergocalciferol (vitamin D2) 1,250 mcg (50,000 unit) Capsule  Commonly known as: DRISDOL   50,000 Units, EVERY 7  DAYS  Refills: 0     furosemide  20 mg Tablet  Commonly known as: LASIX    20 mg, Oral, DAILY PRN  Qty: 90 Tablet  Refills: 3     gabapentin  800 mg Tablet  Commonly known as: NEURONTIN    800 mg, 3 TIMES DAILY  Refills: 0     glimepiride 4 mg Tablet  Commonly known as: AMARYL   4 mg, 2 TIMES DAILY  Refills: 0     hydroCHLOROthiazide  25 mg Tablet  Commonly known as: HYDRODIURIL    25 mg, Oral, Daily  Qty: 90 Tablet  Refills: 4     Ibuprofen  600 mg Tablet  Commonly known as: MOTRIN    600 mg, Oral, 4 TIMES DAILY PRN  Qty: 30 Tablet  Refills: 0     insulin  glargine 100 unit/mL injection (vial)   15 Units, EVERY EVENING  Refills: 0     ipratropium-albuteroL  0.5 mg-3 mg(2.5 mg base)/3 mL nebulizer solution  Commonly known as: DUONEB   3 mL, Nebulization, 4 TIMES DAILY  Qty: 360 mL  Refills: 5     loratadine 10 mg Tablet  Commonly known as: CLARITIN   10 mg, Daily  Refills: 0     montelukast  10 mg Tablet  Commonly known as: SINGULAIR    10 mg, Oral, EVERY EVENING  Qty: 30 Tablet  Refills: 5     omeprazole 40 mg Capsule, Delayed Release(E.C.)  Commonly known as: PRILOSEC   40 mg, Daily  Refills: 0     Ozempic 1 mg/dose (4 mg/3 mL) Pen Injector  Generic drug: semaglutide   1 mg, EVERY 7 DAYS  Refills: 0     ranolazine  500 mg Tablet Sustained Release 12 hr  Commonly known as: Ranexa    500 mg, Oral, 2 TIMES DAILY  Qty: 180 Tablet  Refills: 3     Spiriva  Respimat 2.5 mcg/actuation oral inhaler  Generic drug: tiotropium bromide   2 Puffs, Inhalation, Daily  Qty: 4 g  Refills: 2     telmisartan  40 mg Tablet  Commonly known as: MICARDIS    40 mg, Oral, Daily  Qty: 90 Tablet  Refills: 4     thyroid  180 mg Tablet  Commonly known as: ARMOUR THYROID    180 mg, Daily  Refills: 0            STOP taking these medications.      ARIPiprazole 10 mg Tablet  Commonly known as: ABILIFY  Banophen  25 mg Tablet  Generic drug: diphenhydrAMINE      cephalexin  500 mg Capsule  Commonly known as: KEFLEX      dilTIAZem 60 mg Capsule, Sust. Release 12  hr  Commonly known as: CARDIZEM SR     DULoxetine 30 mg Capsule, Delayed Release(E.C.)  Commonly known as: CYMBALTA DR     hydrOXYzine HCL 25 mg Tablet  Commonly known as: ATARAX             Condition at discharge:  Patient is stable and improved.  Patient is not suicidal, homicidal, or psychotic. She is at high risk for readmission due to poor compliance with medications and substance abuse.   Diagnoses:   F31.63 Bipolar disorder, current episode mixed, severe, without psychotic features  R45.851 suicidal ideation   F10.14 Alcohol abuse with alcohol-induced mood disorder  Z59. 0 Homelessness        Differential Diagnoses:  Borderline personality disorder   Alcohol abuse induced mood disorder   THC abuse induced mood disorder       Plan:  Patient will follow-up with outpatient provider as scheduled and provided by Social work  Post-Discharge Follow Up Appointments       Go to [employer]    Where: P: [phone] F: [phone]      Wednesday Dec 06, 2023    Return Patient Visit with Manley Earing, FNP-C at  2:30 PM      Monday Jan 15, 2024    Return Patient Visit with Ezzard Shuck, FNP-BC at  3:50 PM      Cardiology [facility], [facility], [place]  [address]  [place] [address]  [phone] Pulmonary, [facility], [place]   [address]  [place] [address]  [phone]           Discharge >22min: Time taken on day of discharge exceeded 30 minutes and included medication reconciliation, treatment team staffing, review of record, discussion with patient and completion of all documentation.  Interaction Attestation: Clinical telemedicine services delivered using HIPAA-compliant interactive video-audio telecommunications while the patient and the rendering provider were not in the same physical location. Patient agreeable to telecommunication.    TELEMEDICINE DOCUMENTATION:     Patient Location:  [facility], [address], [place], [place] [address]  Provider Location: Remote  Patient/family aware of provider location:  yes  Patient/family consent for telemedicine:  yes  Examination observed and performed by:  .Ezella Sanders, PMHNP    ATTENDING ATTESTATION:  The NP evaluated the patient and provided care. I have reviewed the note. Discussed the patient in treatment team. I agree with their findings and at this time have no amendments to the current plan.     Prentice ROCKFORD Quin, DO  Psychiatrist  Medical Director  [facility], [facility]

## 2023-11-30 NOTE — Behavioral Health (Signed)
 The counselor spoke to Luke over the outpatient SOP program who wanted to know why the patient was in PICU.  The counselor explained that the patient just got upset because she needed to make phone calls and wanted to leave and they moved her over to the PICU  after that.    The counselor was told that they would come up and meet with the patient today to assess her for the SOP program and check on her insurance.    The counselor will continue to work on care coordination and discharge planning as needed for the patient.

## 2023-11-30 NOTE — Nurses Notes (Addendum)
 7A TO 7P NURSE NOTES; PATIENT IS DRESSED IN PICU ATTIRE AND HAS BEEN UP ON UNIT  AD LIB. PLEASANT, CALM, AND COOPERATIVE. STATES SLEEP AND APPETITE HAVE BEEN GOOD. REPORTS LAST BM AS TODAY. DENIES SI, HI, AND A/V/T HALLUCINATIONS. RATES ANXIETY ADS 6 AND DEPRESSION AS 0 ON 0 TO 10 SCALE WITH 10 BEING THE HIGHEST. WILL CONTINUE TO VISUALLY MONITOR Q 15 MINUTES AND PRN PER PROTOCOL.    PATIENT CAME UP TO NURSE DESK AND WANTED TO KNOW WHAT KIND OF RESTRICTIONS She HAD ON HER AND DEMANDED A CUP OF COFFEE WITH 2 PACKS OF CREAM AND 7 PACKS OF SUGAR. THIS NURSE INFORMED THE PATIENT AS HAD THE TECH EARLIER THAT I COULD NOT GIVE HER 7 PACKS OF SUGAR IN HER COFFEE SINCE She WAS DIABETIC. She STARTED GETTING ANGRY AND STATED THAT WAS RUDE AND She DIDN'T HAVE ANY RESTRICTIONS AGAINST HER AND WAS SIGNING A WAIVER AGAINST IT. THIS NURSE INFORMED PATIENT AGAIN THAT WE COULD NOT GIVEN HER THAT MANY SUGARS AT ONE TIME. She BECAME ANGRY AND AGITATED AT STAFF AND STATED She WAS GOING TO SUE US  FOR VIOLATING HER RIGHTS. She STORMED TO HER ROOM AND SLAMMED THE DOOR. She THEN REFUSED TO HAVE HER ACCU CHECK DONE.

## 2023-11-30 NOTE — Nurses Notes (Signed)
 1900-0700 Nursing Shift Note: Pt is sitting in the dayroom writing notes and socializing most of the evening. Pt was upset when she went to take a shower because the staff could not find her lotion and body wash initially, but calmed down once the items were found. Pt states she is excited about her discharge tomorrow (12/01/2023), but also anxious. Pt requested a Seroquel and received it this evening. Pt also requested a zyPREXA 5 mg for agitation, a PRN MOTRIN  600 mg for pain, and a PRN albuterol  90 mcg for preventing dyspnea. Pt rates anxiety as an 8/10, but denies any depression. Pt denies any SI, HI, or hallucinations.     Staff will continue to monitor the pt Q15 minutes and PRN.

## 2023-11-30 NOTE — Group Note (Signed)
 Group topic:  EDUCATION GROUP    Date of group:  11/30/2023  Start time of group:  1500  End time of group:  1530    Attend:  [x]   Not Attend:  []   Attendance:  inpatient attended all of group               Summary of group discussion:  MUSIC THERAPY      Jennifer [name]  is a 48 y.o. female participating in a experiential/activity group.    Patient's mental status/affect:    Patient's behavior:    Patient's response:    Suzen Cancer, RN  11/30/2023, 15:51

## 2023-12-01 LAB — POC BLOOD GLUCOSE (RESULTS): GLUCOSE, POC: 188 mg/dL — ABNORMAL HIGH (ref 70–100)

## 2023-12-01 NOTE — Nurses Notes (Signed)
 Patient discharged home with family.  AVS reviewed with patient.  A written copy of the AVS and discharge instructions was given to the patient.  Questions sufficiently answered as needed.  Patient encouraged to follow up with PCP as indicated.  In the event of an emergency, patient instructed to call 911 or go to the nearest emergency room.

## 2023-12-01 NOTE — Discharge Instructions (Signed)
 Reason for Admission:  I'm trying to get everything back together; everything is falling apart with my drinking.                                          Bipolar Disorder, current episode mixed, severe, without psychotic features.    LAB TESTING      Lab Results   Component Value Date    HA1C 8.4 (H) 11/24/2023     No results found for: GLUCOSEFAST  Lab Results   Component Value Date    CHOLESTEROL 177 11/24/2023    LDLCHOL 88 11/24/2023    TRIG 246 (H) 11/24/2023     Lab Results   Component Value Date/Time    ALBUMIN 3.9 11/23/2023 12:34 PM    TOTALPROTEIN 7.0 11/23/2023 12:34 PM    ALKPHOS 98 11/23/2023 12:34 PM    PROTHROMTME 10.9 11/12/2023 02:35 PM    INR 0.96 11/12/2023 02:35 PM      Lab Results   Component Value Date/Time    AST 20 11/23/2023 12:34 PM    ALT 32 11/23/2023 12:34 PM     Lab Results   Component Value Date/Time    WBC 10.4 11/23/2023 12:34 PM    HGB 12.1 11/23/2023 12:34 PM    HCT 36.4 11/23/2023 12:34 PM    PLTCNT 234 11/23/2023 12:34 PM     Lab Results   Component Value Date    COVID19PCR Not Detected 11/15/2023     Lab Results   Component Value Date/Time    BUNCRRATIO 11 11/23/2023 12:34 PM      Pended studies: None      Patient declined both outpatient counseling and prescription for tobacco cessation.  Shona MOTE RN  12/01/2023, 07:38

## 2023-12-01 NOTE — Care Plan (Signed)
 PATIENT CALM, COOPERATIVE. RATES ANXIETY 8/10. COMPLIANT WITH MEDICATIONS. PLAN OF CARE ONGOING.      Problem: Adult Behavioral Health Plan of Care  Goal: Plan of Care Review  Outcome: Ongoing (see interventions/notes)  Goal: Patient-Specific Goal (Individualization)  Outcome: Ongoing (see interventions/notes)  Goal: Adheres to Safety Considerations for Self and Others  Outcome: Ongoing (see interventions/notes)  Goal: Absence of New-Onset Illness or Injury  Outcome: Ongoing (see interventions/notes)  Goal: Optimized Coping Skills in Response to Life Stressors  Outcome: Ongoing (see interventions/notes)  Goal: Develops/Participates in Therapeutic Alliance to Support Successful Transition  Outcome: Ongoing (see interventions/notes)  Goal: Rounds/Family Conference  Outcome: Ongoing (see interventions/notes)     Problem: Health Knowledge, Opportunity to Enhance (Adult,Obstetrics,Pediatric)  Goal: Knowledgeable about Health Subject/Topic  Description: Patient will demonstrate the desired outcomes by discharge/transition of care.  Outcome: Ongoing (see interventions/notes)     Problem: Suicide Risk  Goal: Absence of Self-Harm  Outcome: Ongoing (see interventions/notes)     Problem: Anxiety Signs/Symptoms  Goal: Optimized Energy Level (Anxiety Signs/Symptoms)  Outcome: Ongoing (see interventions/notes)  Goal: Optimized Cognitive Function (Anxiety Signs/Symptoms)  Outcome: Ongoing (see interventions/notes)  Goal: Improved Mood Symptoms (Anxiety Signs/Symptoms)  Outcome: Ongoing (see interventions/notes)  Goal: Improved Sleep (Anxiety Signs/Symptoms)  Outcome: Ongoing (see interventions/notes)  Goal: Enhanced Social, Occupational or Functional Skills (Anxiety Signs/Symptoms)  Outcome: Ongoing (see interventions/notes)  Goal: Improved Somatic Symptoms (Anxiety Signs/Symptoms)  Outcome: Ongoing (see interventions/notes)     Problem: Depressive Signs/Symptoms  Goal: Optimized Energy Level (Depressive Signs/Symptoms)  Outcome:  Ongoing (see interventions/notes)  Goal: Optimized Cognitive Function (Depressive Signs/Symptoms)  Outcome: Ongoing (see interventions/notes)  Goal: Increased Participation and Engagement (Depressive Signs/Symptoms)  Outcome: Ongoing (see interventions/notes)  Goal: Enhanced Self-Esteem and Confidence (Depressive Signs/Symptoms)  Outcome: Ongoing (see interventions/notes)  Goal: Improved Mood Symptoms (Depressive Signs/Symptoms)  Outcome: Ongoing (see interventions/notes)  Goal: Optimized Nutrition Intake (Depressive Signs/Symptoms)  Outcome: Ongoing (see interventions/notes)  Goal: Improved Psychomotor Symptoms (Depressive Signs/Symptoms)  Outcome: Ongoing (see interventions/notes)  Goal: Improved Sleep (Depressive Signs/Symptoms)  Outcome: Ongoing (see interventions/notes)  Goal: Enhanced Social, Occupational or Functional Skills (Depressive Signs/Symptoms)  Outcome: Ongoing (see interventions/notes)     Problem: Adult Treatment Plan  Goal: Patient will meet daily with attending physician.  Description: Physicians will evaluate patient, discuss diagnosis, prescribe medications and order diagnostic testing as clinically indicated. Physicians will monitor safety risks and assess when appropriate for discharge.  Outcome: Ongoing (see interventions/notes)  Goal: Patient will complete psychosocial history assessment within 60 hours of admission with Clinical Therapist.  Outcome: Ongoing (see interventions/notes)  Goal: Patient will participate in 100% of therapy groups facilitated by Clinical Therapist.  Description: Topics will include Dialectical Behavior Therapy with the components of Mindfulness, Distress Tolerance, Emotion Regulation and Crisis Survival Skills, as well as Stress Management skills.  Outcome: Ongoing (see interventions/notes)

## 2023-12-05 ENCOUNTER — Encounter (HOSPITAL_PSYCHIATRIC): Payer: Self-pay

## 2023-12-06 ENCOUNTER — Ambulatory Visit: Attending: NURSE PRACTITIONER | Admitting: PHYSICIAN ASSISTANT

## 2023-12-06 ENCOUNTER — Ambulatory Visit (HOSPITAL_PSYCHIATRIC)

## 2023-12-06 ENCOUNTER — Other Ambulatory Visit: Payer: Self-pay

## 2023-12-06 ENCOUNTER — Encounter (INDEPENDENT_AMBULATORY_CARE_PROVIDER_SITE_OTHER): Payer: Self-pay | Admitting: NURSE PRACTITIONER

## 2023-12-06 VITALS — BP 144/75 | HR 109 | Ht 67.0 in | Wt 267.1 lb

## 2023-12-06 DIAGNOSIS — Z7982 Long term (current) use of aspirin: Secondary | ICD-10-CM

## 2023-12-06 DIAGNOSIS — I5030 Unspecified diastolic (congestive) heart failure: Secondary | ICD-10-CM

## 2023-12-06 DIAGNOSIS — R9431 Abnormal electrocardiogram [ECG] [EKG]: Secondary | ICD-10-CM

## 2023-12-06 DIAGNOSIS — I2510 Atherosclerotic heart disease of native coronary artery without angina pectoris: Secondary | ICD-10-CM | POA: Insufficient documentation

## 2023-12-06 DIAGNOSIS — I10 Essential (primary) hypertension: Secondary | ICD-10-CM | POA: Insufficient documentation

## 2023-12-06 DIAGNOSIS — E785 Hyperlipidemia, unspecified: Secondary | ICD-10-CM

## 2023-12-06 DIAGNOSIS — R000 Tachycardia, unspecified: Secondary | ICD-10-CM

## 2023-12-06 MED ORDER — ATORVASTATIN 80 MG TABLET
80.0000 mg | ORAL_TABLET | Freq: Every evening | ORAL | 3 refills | Status: AC
Start: 2023-12-06 — End: ?

## 2023-12-06 NOTE — Progress Notes (Signed)
 [facility]     Jennifer [name], [place][age] y.o. female  Date of Service: 12/06/2023  Date of Birth:  26-Dec-1975  PCP:  Ronal GORMAN Ice, CRNP  Chief Complaint   Patient presents with    Follow Up     1 month  Atypical Angina    Heart Disease    Hypertension    Dyslipidemia        HPI:    The pt has mild CAD. PMH diabetes mellitus type 2 since 2003, hypothyroidism, hyperlipidemia, depression, obesity, hypertension, gastroesophageal reflux disease, OSA on CPAP, and chronic obstructive pulmonary disease.  She is [name] former smoker. 11/2017 LHC Mid LAD lesion 30%, LVEDP normal, normal RHC. 07/2023 MPS stress test  reported as abnormal for reversible moderate intensity anterior wall LV perfusion defect possible ischemia.  However shifting breast shadow therefore likely artifact. CT correction not available,  EF 70%.  Echocardiogram EF 64%, no wall motion abnormalities identified, diastolic dysfunction indeterminate,  no valve disease reported.     08/29/2023 The patient presents to establish care for abnormal stress test.  She complains of lower extremity edema and chronic shortness of breath with recent worsening.  She reports she is having difficulty with ADLs due to dyspnea.  She reports she was told she had HF when she lived in [place] . She has not had [name] cardiologist since moving to this area 3 years ago.  She also reports cardiac catheterization around 2019 that showed mild CAD.      10/24/23 Patient returns for follow up of CTA which unfortunately could not be completed due to heart rate remaining too high. She has continued to have shortness of breath. She will intermittently have dull chest pain as well. BP remains elevated at 170/100.     12/06/23 Patient here today for routine follow up. She has been doing fair. She does not feel well today. Denies any recent exertional chest pain. Does still have some mild swelling of her ankles but compression socks help with this. Tolerating medications well.      EKG:  Sinus tachycardia 109.  No acute ST elevation  LAB:     Lab Results   Component Value Date    CHOLESTEROL 177 11/24/2023    HDLCHOL 40 11/24/2023    LDLCHOL 88 11/24/2023    TRIG 246 (H) 11/24/2023       Lab Results   Component Value Date    HA1C 8.4 (H) 11/24/2023      Last BMP  (Last result in the past 2 years)        Na   K   Cl   CO2   BUN   Cr   Calcium   Glucose   Glucose-Fasting        11/24/23 0550               266                  Past Medical History:   Diagnosis Date    Asthma     COPD (chronic obstructive pulmonary disease)     Diabetes mellitus, type 2     HTN (hypertension)     Narcolepsy     OSA (obstructive sleep apnea)     CPAP    Thyroid  disease        Past Surgical History:   Procedure Laterality Date    HX CHOLECYSTECTOMY      HX TUBAL LIGATION  SHOULDER SURGERY Bilateral        Current Outpatient Medications   Medication Sig    acarbose  (PRECOSE ) 100 mg Oral Tablet Take 1 Tablet (100 mg total) by mouth Twice daily    albuterol  sulfate (PROVENTIL  OR VENTOLIN  OR PROAIR ) 90 mcg/actuation Inhalation oral inhaler Take 1-2 Puffs by inhalation Every 6 hours as needed    aspirin  (ECOTRIN) 81 mg Oral Tablet, Delayed Release (E.C.) Take 1 Tablet (81 mg total) by mouth Daily    atorvastatin  (LIPITOR) 80 mg Oral Tablet Take 1 Tablet (80 mg total) by mouth Every evening    carvediloL  (COREG ) 12.5 mg Oral Tablet Take 1 Tablet (12.5 mg total) by mouth Twice daily with food    empagliflozin  (JARDIANCE ) 25 mg Oral Tablet Take 1 Tablet (25 mg total) by mouth Daily    ergocalciferol , vitamin D2, (DRISDOL ) 1,250 mcg (50,000 unit) Oral Capsule Take 1 Capsule (50,000 Units total) by mouth Every 7 days    furosemide  (LASIX ) 20 mg Oral Tablet Take 1 Tablet (20 mg total) by mouth Once per day as needed for Other (fluid retention) for up to 360 days    gabapentin  (NEURONTIN ) 800 mg Oral Tablet Take 1 Tablet (800 mg total) by mouth Three times [name] day    glimepiride  (AMARYL ) 4 mg Oral Tablet Take 1 Tablet (4  mg total) by mouth Twice daily    hydroCHLOROthiazide  (HYDRODIURIL ) 25 mg Oral Tablet Take 1 Tablet (25 mg total) by mouth Daily    Ibuprofen  (MOTRIN ) 600 mg Oral Tablet Take 1 Tablet (600 mg total) by mouth Four times [name] day as needed for Pain    insulin  glargine 100 unit/mL Subcutaneous injection (vial) Inject 15 Units under the skin Every evening    ipratropium-albuterol  0.5 mg-3 mg(2.5 mg base)/3 mL Solution for Nebulization Take 3 mL by nebulization Four times [name] day    loratadine (CLARITIN) 10 mg Oral Tablet Take 1 Tablet (10 mg total) by mouth Daily    mometasone-formoterol  (DULERA ) 100-5 mcg/dose Inhalation HFA Aerosol Inhaler Take 2 Puffs by inhalation Twice daily Rinse mouth after each use of inhaled steroid.    montelukast  (SINGULAIR ) 10 mg Oral Tablet Take 1 Tablet (10 mg total) by mouth Every evening    omeprazole  (PRILOSEC) 40 mg Oral Capsule, Delayed Release(E.C.) Take 1 Capsule (40 mg total) by mouth Daily    OZEMPIC 1 mg/dose (4 mg/3 mL) Subcutaneous Pen Injector Inject 1 mg under the skin Every 7 days    paliperidone  (INVEGA ) 6 mg Oral Tablet Extended Rel 24 hr Take 1 Tablet (6 mg total) by mouth Daily for 14 days    ranolazine  (RANEXA ) 500 mg Oral Tablet Sustained Release 12 hr Take 1 Tablet (500 mg total) by mouth Twice daily    telmisartan  (MICARDIS ) 40 mg Oral Tablet Take 1 Tablet (40 mg total) by mouth Daily    thyroid  (ARMOUR THYROID ) 180 mg Oral Tablet Take 1 Tablet (180 mg total) by mouth Daily    tiotropium bromide  (SPIRIVA  RESPIMAT) 2.5 mcg/actuation Inhalation oral inhaler Take 2 Inhalations (2 Puffs total) by inhalation Daily     ROS: Other than issues noted in HPI, all other systems were negative.     Exam:  Vitals:    12/06/23 1400   BP: (!) 144/75   Pulse: (!) 109   SpO2: 96%   Weight: 121 kg (267 lb 2 oz)   Height: 1.702 m (5' 7)   BMI: 41.84       General: No acute  distress and appears stated age.    HEENT:Head normocephalic, atraumatic. ENT without erythema or injection, mucouse  membranes moist.    Neck: No JVD, no carotid bruit. and supple, symmetrical, trachea midline.   Lungs: Clear to auscultation bilaterally.    Cardiovascular: Regular rate and rhythm, normal S1 S2, no murmur, no rub, or gallop, no thrill     Abdomen: Soft, non-tender and bowel sounds normal.    Extremities: Extremities normal, atraumatic, no cyanosis or edema.    Skin: Skin warm and dry.    Neurologic: Alert and oriented x3.  Psych: Mood and affect congruent for age and gender     Orders placed this visit:  Orders Placed This Encounter    EKG (In-Clinic Today)    atorvastatin  (LIPITOR) 80 mg Oral Tablet       Assessment/Plan:  CAD in native artery    Hypertension, unspecified type    Dyslipidemia    Heart failure with preserved ejection fraction, unspecified HF chronicity (CMS HCC)    Patient stable from CAD standpoint  BP and HR mildly elevated today but patient is actively sick  LDL not at goal  Increase Lipitor to 80 mg daily   Continue aspirin  81 mg daily  Continue Coreg  12.5 mg twice daily  Continue Jardiance  25 mg daily  Continue Lasix  20 mg daily p.r.n.  Continue HCTZ 25 mg daily  Continue Ranexa  500 mg twice daily  Continue telmisartan  40 mg daily  Follow up in 6 months      Fox Salminen [name] Glance, PA-C 12/06/2023 15:04

## 2023-12-07 ENCOUNTER — Emergency Department
Admission: EM | Admit: 2023-12-07 | Discharge: 2023-12-07 | Disposition: A | Discharge status: Home / Self Care | Attending: Emergency Medicine | Admitting: Emergency Medicine

## 2023-12-07 ENCOUNTER — Ambulatory Visit: Attending: Psychiatry

## 2023-12-07 ENCOUNTER — Encounter (HOSPITAL_COMMUNITY): Payer: Self-pay

## 2023-12-07 DIAGNOSIS — R110 Nausea: Secondary | ICD-10-CM | POA: Insufficient documentation

## 2023-12-07 DIAGNOSIS — R000 Tachycardia, unspecified: Secondary | ICD-10-CM | POA: Insufficient documentation

## 2023-12-07 DIAGNOSIS — K30 Functional dyspepsia: Secondary | ICD-10-CM | POA: Insufficient documentation

## 2023-12-07 DIAGNOSIS — Z029 Encounter for administrative examinations, unspecified: Secondary | ICD-10-CM

## 2023-12-07 DIAGNOSIS — R112 Nausea with vomiting, unspecified: Secondary | ICD-10-CM

## 2023-12-07 LAB — COMPREHENSIVE METABOLIC PANEL, NON-FASTING
ALBUMIN/GLOBULIN RATIO: 1.1 (ref 0.8–1.4)
ALBUMIN: 3.7 g/dL (ref 3.5–5.7)
ALKALINE PHOSPHATASE: 96 U/L (ref 34–104)
ALT (SGPT): 26 U/L (ref 7–52)
ANION GAP: 7 mmol/L (ref 4–13)
AST (SGOT): 26 U/L (ref 13–39)
BILIRUBIN TOTAL: 0.6 mg/dL (ref 0.3–1.0)
BUN/CREA RATIO: 19 (ref 6–22)
BUN: 21 mg/dL (ref 7–25)
CALCIUM, CORRECTED: 8.7 mg/dL — ABNORMAL LOW (ref 8.9–10.8)
CALCIUM: 8.5 mg/dL — ABNORMAL LOW (ref 8.6–10.3)
CHLORIDE: 104 mmol/L (ref 98–107)
CO2 TOTAL: 23 mmol/L (ref 21–31)
CREATININE: 1.11 mg/dL (ref 0.60–1.30)
ESTIMATED GFR: 61 mL/min/1.73mˆ2 (ref >59)
GLOBULIN: 3.3 (ref 2.0–3.5)
GLUCOSE: 224 mg/dL — ABNORMAL HIGH (ref 74–109)
OSMOLALITY, CALCULATED: 278 mosm/kg (ref 270–290)
POTASSIUM: 4.1 mmol/L (ref 3.5–5.1)
PROTEIN TOTAL: 7 g/dL (ref 6.4–8.9)
SODIUM: 134 mmol/L — ABNORMAL LOW (ref 136–145)

## 2023-12-07 LAB — CBC WITH DIFF
BASOPHIL #: 0 x10ˆ3/uL (ref 0.00–0.10)
BASOPHIL %: 0 % (ref 0–1)
EOSINOPHIL #: 0.1 x10ˆ3/uL (ref 0.00–0.50)
EOSINOPHIL %: 1 % (ref 1–7)
HCT: 37.4 % (ref 31.2–41.9)
HGB: 12.3 g/dL (ref 10.9–14.3)
LYMPHOCYTE #: 0.6 x10ˆ3/uL — ABNORMAL LOW (ref 1.10–3.10)
LYMPHOCYTE %: 11 % — ABNORMAL LOW (ref 16–46)
MCH: 26.9 pg (ref 24.7–32.8)
MCHC: 33 g/dL (ref 32.3–35.6)
MCV: 81.5 fL (ref 75.5–95.3)
MONOCYTE #: 0.4 x10ˆ3/uL (ref 0.20–0.90)
MONOCYTE %: 7 % (ref 4–11)
MPV: 9.8 fL (ref 7.9–10.8)
NEUTROPHIL #: 4 x10ˆ3/uL (ref 1.90–8.20)
NEUTROPHIL %: 80 % — ABNORMAL HIGH (ref 43–77)
PLATELETS: 243 x10ˆ3/uL (ref 140–440)
RBC: 4.59 x10ˆ6/uL (ref 3.63–4.92)
RDW: 16.2 % (ref 12.3–17.7)
WBC: 5 x10ˆ3/uL (ref 3.8–11.8)

## 2023-12-07 LAB — URINALYSIS, MACROSCOPIC
BILIRUBIN: NEGATIVE mg/dL
BLOOD: NEGATIVE mg/dL
GLUCOSE: >1000 mg/dL — AB
KETONES: 20 mg/dL — AB
LEUKOCYTES: NEGATIVE WBCs/uL
NITRITE: NEGATIVE
PH: 5.5 (ref 5.0–9.0)
PROTEIN: 20 mg/dL
SPECIFIC GRAVITY: 1.037 — ABNORMAL HIGH (ref 1.002–1.030)
UROBILINOGEN: NORMAL mg/dL

## 2023-12-07 LAB — ECG W INTERP (AMB USE ONLY)(MUSE,IN CLINIC)
Atrial Rate: 109 {beats}/min
Calculated P Axis: 68 degrees
Calculated R Axis: 1 degrees
Calculated T Axis: 30 degrees
PR Interval: 146 ms
QRS Duration: 76 ms
QT Interval: 350 ms
QTC Calculation: 471 ms
Ventricular rate: 109 {beats}/min

## 2023-12-07 LAB — HCG, URINE QUALITATIVE, PREGNANCY: HCG URINE QUALITATIVE: NEGATIVE

## 2023-12-07 LAB — SCAN DIFFERENTIAL
PLATELET MORPHOLOGY COMMENT: NORMAL
RBC MORPHOLOGY COMMENT: NORMAL
SCHISTOCYTES: ABSENT

## 2023-12-07 LAB — URINALYSIS, MICROSCOPIC
BACTERIA: NEGATIVE /HPF
RBCS: 8 /HPF — ABNORMAL HIGH (ref <4)
SQUAMOUS EPITHELIAL: 7 /HPF (ref <28)
WBCS: 1 /HPF (ref <6)

## 2023-12-07 LAB — LIPASE: LIPASE: 31 U/L (ref 11–82)

## 2023-12-07 MED ORDER — ONDANSETRON 4 MG DISINTEGRATING TABLET
8.0000 mg | ORAL_TABLET | ORAL | Status: AC
Start: 2023-12-07 — End: 2023-12-07
  Administered 2023-12-07: 8 mg via ORAL

## 2023-12-07 MED ORDER — ONDANSETRON 4 MG DISINTEGRATING TABLET
ORAL_TABLET | ORAL | Status: AC
Start: 2023-12-07 — End: 2023-12-07
  Filled 2023-12-07: qty 2

## 2023-12-07 MED ORDER — ONDANSETRON 4 MG DISINTEGRATING TABLET
4.0000 mg | ORAL_TABLET | Freq: Three times a day (TID) | ORAL | 0 refills | Status: AC | PRN
Start: 2023-12-07 — End: ?

## 2023-12-07 NOTE — ED Triage Notes (Signed)
 N/V/D started yesterday, reports body aches and abd pain.

## 2023-12-07 NOTE — ED Provider Notes (Signed)
  Med[facility]  ED Primary Provider Note  Patient Name: Jennifer [name]  Patient Age: 48 y.o.  Date of Birth: Sep 28, 1975    Chief Complaint: Vomiting, Nausea, and Diarrhea        History of Present Illness       Jennifer [name] is a 48 y.o. female who had concerns including Vomiting, Nausea, and Diarrhea.     History of Present Illness  Jennifer [name] is a 48 year old female who presents with severe abdominal pain, nausea, vomiting, and diarrhea. She is accompanied by her friend's daughter.    She has been experiencing severe abdominal pain, nausea, vomiting, and diarrhea since yesterday morning. The abdominal pain is diffuse and described as 'really bad.' She has been unable to keep fluids down and has not consumed much water  or other beverages.    She took Pepto-Bismol last night, which provided minimal relief. She was supposed to take Zofran  for nausea but did not have any available.    She mentions a possible exposure to a virus from a baby who visited and had diarrhea. No recent restaurant visits or food poisoning episodes.    She had a urinary tract infection last week for which she was hospitalized and treated. Currently, she is not experiencing burning during urination but has urinated only a little.    She is uncertain about a possible pregnancy, stating 'I may not be pregnant.'      Review of Systems     No other overt Review of Systems are noted to be positive except noted in the HPI.      Historical Data   History Reviewed This Encounter: Medical History  Surgical History  Family History  Social History      Physical Exam   ED Triage Vitals [12/07/23 1149]   BP (Non-Invasive) 121/74   Heart Rate 100   Respiratory Rate 18   Temperature 36.2 C (97.1 F)   SpO2 97 %   Weight 121 kg (267 lb)   Height 1.702 m (5' 7)         Nursing notes reviewed for what could be assessed. Past Medical, Surgical, and Social history reviewed for what has been completed.     Constitutional:  NAD. Well-Developed. Well Nourished.  Head: Normocephalic, atraumatic.  Mouth/Throat:  That has symmetric facial movement.  Eyes: EOM grossly intact, conjunctiva normal.  Neck: Supple  Cardiovascular: Regular Rate and Rhythm, extremities well perfused.  Pulmonary/Chest: No respiratory distress. Lungs are symmetric to auscultation bilaterally.  Abdominal: Soft, no focal tenderness, non-distended. Non peritoneal, no rebound, no guarding.  MSK: No Lower Extremity Edema.  Skin: Warm, dry.  Neuro: Appropriate, CN II-XII grossly intact.  Psych: Pleasant        Physical Exam          Procedures      Patient Data     Labs Ordered/Reviewed   COMPREHENSIVE METABOLIC PANEL, NON-FASTING - Abnormal; Notable for the following components:       Result Value    SODIUM 134 (*)     CALCIUM 8.5 (*)     GLUCOSE 224 (*)     CALCIUM, CORRECTED 8.7 (*)     All other components within normal limits    Narrative:     Estimated Glomerular Filtration Rate (eGFR) is calculated using the CKD-EPI (2021) equation, intended for patients 29 years of age and older. If gender is not documented or unknown, there will be no eGFR  calculation.     CBC WITH DIFF - Abnormal; Notable for the following components:    NEUTROPHIL % 80 (*)     LYMPHOCYTE % 11 (*)     LYMPHOCYTE # 0.60 (*)     All other components within normal limits   URINALYSIS, MACROSCOPIC - Abnormal; Notable for the following components:    SPECIFIC GRAVITY 1.037 (*)     GLUCOSE >1000 (*)     KETONES 20 (*)     All other components within normal limits   URINALYSIS, MICROSCOPIC - Abnormal; Notable for the following components:    BUDDING YEAST Rare (*)     RBCS 8 (*)     All other components within normal limits   LIPASE - Normal   CBC/DIFF    Narrative:     The following orders were created for panel order CBC/DIFF.  Procedure                               Abnormality         Status                     ---------                               -----------         ------                      CBC WITH IPQQ[[id number]]                Abnormal            Final result                 Please view results for these tests on the individual orders.   URINALYSIS, MACROSCOPIC AND MICROSCOPIC W/CULTURE REFLEX    Narrative:     The following orders were created for panel order URINALYSIS, MACROSCOPIC AND MICROSCOPIC W/CULTURE REFLEX.  Procedure                               Abnormality         Status                     ---------                               -----------         ------                     URINALYSIS, MACROSCOPIC[[id number]]      Abnormal            Final result               URINALYSIS, MICROSCOPIC[[id number]]      Abnormal            Final result                 Please view results for these tests on the individual orders.   SCAN DIFFERENTIAL   HCG, URINE QUALITATIVE, PREGNANCY       No orders to display       Medical Decision Making  Medical Decision Making      Results          Studies Assessed:  Lab, EKG    EKG:   This independent EKG interpretation by that has noted below, this will be reviewed and documented separately by a cardiologist in the EMR.:    Rate:  102 beats per minute    Interpretation:  PR 150, No consistent ST Elevation, No Acute STEMI Identified.      MDM Narrative:  Medical Decision Making  A female patient presents to the Emergency Department with acute onset of nausea, vomiting, diarrhea, and severe diffuse abdominal pain since yesterday morning. She reports a possible viral gastroenteritis contracted from a baby with diarrhea. There is no recent history of food poisoning or restaurant encounters. The patient has a history of a urinary tract infection treated last week, but currently denies urinary symptoms. There is a possibility of pregnancy, though not confirmed.    Differential diagnosis includes, but is not limited to:  - Viral Gastroenteritis: The patient's symptoms of nausea, vomiting, diarrhea, and diffuse abdominal pain are consistent with viral  gastroenteritis, especially given the recent contact with a baby with diarrhea.  - Pregnancy-related symptoms: The possibility of pregnancy was mentioned, which could contribute to nausea and abdominal pain, but the patient declined the chance of pregnancy.  - Food Poisoning: Food poisoning was considered but is less likely due to the absence of recent restaurant encounters or similar exposures.    Gastroenteritis  Acute onset of gastroenteritis symptoms likely due to a viral infection, possibly contracted from a baby with diarrhea.  - Administer Zofran  for nausea.  - Monitor response to antiemetic treatment.  - Avoid CT scan unless symptoms persist or worsen.  - Encourage oral hydration with fluids like water , Sprite, or ginger ale.    On follow up, the patient felt improved and was comfortable being discharged.      Follow-Up Discussion:  The patient felt improved.    Please see documentation above for specific labs and radiology.    Decision for and Complexity of Risk in the ED encounter:  I ordered prescription medications requiring authorization in the ED or at discharge.      Consent for AI Scribe software Abridge obtained verbally for encounter                              Medications Ordered/Administered in the ED   ondansetron  (ZOFRAN  ODT) rapid dissolve tablet (8 mg Oral Given 12/07/23 1236)       Following the history, physical exam, and ED workup, the patient was deemed stable and suitable for discharge. The patient/caregiver was advised to return to the ED for any new or worsening symptoms. Discharge medications, and follow-up instructions were discussed with the patient/caregiver in detail, who verbalizes understanding. The patient/caregiver is in agreement and is comfortable with the plan of care.    Disposition: Discharged         Current Discharge Medication List        START taking these medications.        Details   ondansetron  4 mg Tablet, Rapid Dissolve  Commonly known as: ZOFRAN  ODT   4 mg,  Oral, EVERY 8 HOURS PRN  Qty: 10 Tablet  Refills: 0            CONTINUE these medications - NO CHANGES were made during your visit.  Details   acarbose  100 mg Tablet  Commonly known as: PRECOSE    100 mg, 2 TIMES DAILY  Refills: 0     albuterol  sulfate 90 mcg/actuation oral inhaler  Commonly known as: PROVENTIL  or VENTOLIN  or PROAIR    1-2 Puffs, Inhalation, EVERY 6 HOURS PRN  Qty: 18 g  Refills: 2     aspirin  81 mg Tablet, Delayed Release (E.C.)  Commonly known as: ECOTRIN   81 mg, Oral, Daily  Refills: 0     atorvastatin  80 mg Tablet  Commonly known as: LIPITOR   80 mg, Oral, EVERY EVENING  Qty: 90 Tablet  Refills: 3     carvediloL  12.5 mg Tablet  Commonly known as: COREG    12.5 mg, Oral, 2 TIMES DAILY WITH FOOD  Qty: 180 Tablet  Refills: 3     Dulera  100-5 mcg/actuation HFA Aerosol Inhaler  Generic drug: mometasone-formoterol    2 Puffs, Inhalation, 2 TIMES DAILY, Rinse mouth after each use of inhaled steroid.  Qty: 13 g  Refills: 5     empagliflozin  25 mg Tablet  Commonly known as: JARDIANCE    25 mg, Oral, Daily  Qty: 90 Tablet  Refills: 4     ergocalciferol  (vitamin D2) 1,250 mcg (50,000 unit) Capsule  Commonly known as: DRISDOL    50,000 Units, EVERY 7 DAYS  Refills: 0     furosemide  20 mg Tablet  Commonly known as: LASIX    20 mg, Oral, DAILY PRN  Qty: 90 Tablet  Refills: 3     gabapentin  800 mg Tablet  Commonly known as: NEURONTIN    800 mg, 3 TIMES DAILY  Refills: 0     glimepiride  4 mg Tablet  Commonly known as: AMARYL    4 mg, 2 TIMES DAILY  Refills: 0     hydroCHLOROthiazide  25 mg Tablet  Commonly known as: HYDRODIURIL    25 mg, Oral, Daily  Qty: 90 Tablet  Refills: 4     Ibuprofen  600 mg Tablet  Commonly known as: MOTRIN    600 mg, Oral, 4 TIMES DAILY PRN  Qty: 30 Tablet  Refills: 0     insulin  glargine 100 unit/mL injection (vial)   15 Units, EVERY EVENING  Refills: 0     ipratropium-albuteroL  0.5 mg-3 mg(2.5 mg base)/3 mL nebulizer solution  Commonly known as: DUONEB   3 mL, Nebulization, 4 TIMES  DAILY  Qty: 360 mL  Refills: 5     loratadine 10 mg Tablet  Commonly known as: CLARITIN   10 mg, Daily  Refills: 0     montelukast  10 mg Tablet  Commonly known as: SINGULAIR    10 mg, Oral, EVERY EVENING  Qty: 30 Tablet  Refills: 5     omeprazole  40 mg Capsule, Delayed Release(E.C.)  Commonly known as: PRILOSEC   40 mg, Daily  Refills: 0     Ozempic 1 mg/dose (4 mg/3 mL) Pen Injector  Generic drug: semaglutide   1 mg, EVERY 7 DAYS  Refills: 0     paliperidone  6 mg Tablet Extended Rel 24 hr  Commonly known as: INVEGA    6 mg, Oral, Daily  Qty: 14 Tablet  Refills: 0     ranolazine  500 mg Tablet Sustained Release 12 hr  Commonly known as: Ranexa    500 mg, Oral, 2 TIMES DAILY  Qty: 180 Tablet  Refills: 3     Spiriva  Respimat 2.5 mcg/actuation oral inhaler  Generic drug: tiotropium bromide    2 Puffs, Inhalation, Daily  Qty: 4 g  Refills: 2  telmisartan  40 mg Tablet  Commonly known as: MICARDIS    40 mg, Oral, Daily  Qty: 90 Tablet  Refills: 4     thyroid  180 mg Tablet  Commonly known as: ARMOUR THYROID    180 mg, Daily  Refills: 0            Follow up:   No follow-up provider specified.             Risk as noted in the medical decision-making is in reference to potential morbidity/mortality of management based upon previously established billing guidelines.    Based upon the clinical setting, the likely diagnosis/impression include:    Clinical Impression   Nausea (Primary)   Gastrointestinal discomfort         Discharge Medication List as of 12/07/2023  2:17 PM        START taking these medications    Details   ondansetron  (ZOFRAN  ODT) 4 mg Oral Tablet, Rapid Dissolve Take 1 Tablet (4 mg total) by mouth Every 8 hours as needed for Nausea/Vomiting, Disp-10 Tablet, R-0, E-Rx                   This note was partially created using voice recognition software and is inherently subject to errors including those of syntax and sound alike  substitutions which may escape proof reading. In such instances, original meaning may  be extrapolated by contextual derivation.      /R. Elspeth Griffin, MD, LYDIA PILLING  Department of Emergency Medicine  [facility]

## 2023-12-07 NOTE — ED Nurses Note (Signed)
 A full assessment was not completed by this nurse. The patient was seen and discharged from the waiting area. The discharge instructions were given to the patient and all the questions were answered. The patient ambulated out of the ed

## 2023-12-07 NOTE — ED APP Handoff Note (Signed)
 [facility] - Emergency Department  Emergency Department  Provider in Triage Note    Name: Jennifer [name]  Age: 48 y.o.  Gender: female     Subjective:   Jennifer [name] is a 48 y.o. female who presents with complaint of Vomiting, Nausea, and Diarrhea  . Symptoms started yesterday.  Pepto last night. Multiple family members with similar symptoms. No fever.     Objective:   Filed Vitals:    12/07/23 1149   BP: 121/74   Pulse: 100   Resp: 18   Temp: 36.2 C (97.1 F)   SpO2: 97%      Vitals are also documented in the EMR.    Focused Physical Exam shows no acute distress    Assessment:  A medical screening exam was completed.  This patient is a 48 y.o. female with Vomiting, Nausea, and Diarrhea  .    Plan:  Please see initial orders and work-up in the EMR.  This is to be continued with full evaluation in the main Emergency Department.     No current facility-administered medications for this encounter.     Results for orders placed or performed during the hospital encounter of 12/07/23 (from the past 24 hours)   CBC/DIFF    Collection Time: 12/07/23 11:51 AM    Narrative    The following orders were created for panel order CBC/DIFF.  Procedure                               Abnormality         Status                     ---------                               -----------         ------                     CBC WITH IPQQ[[id number]]                                                                 Please view results for these tests on the individual orders.   URINALYSIS, MACROSCOPIC AND MICROSCOPIC W/CULTURE REFLEX    Collection Time: 12/07/23 11:51 AM    Specimen: Urine, Clean Catch    Narrative    The following orders were created for panel order URINALYSIS, MACROSCOPIC AND MICROSCOPIC W/CULTURE REFLEX.  Procedure                               Abnormality         Status                     ---------                               -----------         ------  URINALYSIS,  MACROSCOPIC[[id number]]                                                     URINALYSIS, MICROSCOPIC[[id number]]                                                       Please view results for these tests on the individual orders.          Lauraine LITTIE Crock PA-C, MPAS   12/07/2023, 11:49   Department of Emergency Medicine  [facility]

## 2023-12-07 NOTE — Discharge Instructions (Signed)
 Thank you for allowing us  to be part of your care.    As we discussed, if your symptoms worsen please return.    Please discuss all medications with your pharmacist to ensure there are no concerns of interactions.    Please ensure all questions or concerns are addressed prior to leaving the hospital. We want to make sure your concerns are addressed to make sure you are as safe and healthy as possible. By leaving the hospital, it is understood you are in agreement with your treatment plan.    You may have received sedating medication during your visit. Please discuss this with your discharging provider nurse as you may not be able to operate machines while the medication is in your system, or while you are taking any potentially sedating prescriptions.    Please call the hospital medical records office for a copy of your finalized results, and review them with a primary care physician, for any findings needing further attention.    If you feel your situation worsens, or does not get better in 48 hours, please see a physician for evaluation.    We encourage you to see your regular doctor as soon as possible to let them know you were seen in the emergency department. They may want to do further testing. If you do not have a doctor, please feel free to call the hospital, and ask for contact information of accepting providers. Please also discuss your vaccinations, and ensure all are up to date.    You may use this document to take today off work or school.

## 2023-12-08 DIAGNOSIS — R000 Tachycardia, unspecified: Secondary | ICD-10-CM

## 2023-12-08 LAB — ECG 12 LEAD
Atrial Rate: 102 {beats}/min
Calculated P Axis: 72 degrees
Calculated R Axis: 24 degrees
Calculated T Axis: 0 degrees
PR Interval: 150 ms
QRS Duration: 70 ms
QT Interval: 348 ms
QTC Calculation: 453 ms
Ventricular rate: 102 {beats}/min

## 2023-12-09 ENCOUNTER — Telehealth (HOSPITAL_COMMUNITY): Payer: Self-pay

## 2023-12-11 ENCOUNTER — Ambulatory Visit

## 2023-12-13 ENCOUNTER — Ambulatory Visit: Attending: Psychiatry

## 2023-12-13 DIAGNOSIS — F419 Anxiety disorder, unspecified: Secondary | ICD-10-CM | POA: Insufficient documentation
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
## 2023-12-13 NOTE — Group Note (Signed)
 BEHAVIORAL MEDICINE[facility] [place]  [address]  [place] [address]  Operated by [facility]  Group Note             Name: Jennifer [name]   Date of Birth: [birth year]   Today's Date: 12/13/2023   Group Start Time:  9:30 AM   Group End Time: 10:30 AM   Group Topic: Intensive Outpatient Program  Number of participants: 8      Summary of group discussion:   Group completed the group Self-Report form and documented in their Health Journal.  Group had a new member today.  All introduced themselves and greeting the person warmly.  The new member was friendly to other group members. Participants identified some symptoms of anxiety and depression they have experienced in the past 48 hours.      [name]'s Participation and Response: Today is [name]'s first day of group.  She is friendly and group introduces themselves. She is having a hard time going to sleep and is sleeping about 5 hours.  She has been sick on her stomach recently and he appetite has not returned.  She stays in a continuous state of anxiousness.         Suicidal/Homicidal Risk:  Currently denies SI/HI and expresses willingness to contact crisis services if needed.

## 2023-12-13 NOTE — Group Note (Signed)
 BEHAVIORAL MEDICINE[facility] [place]  [address]  [place] [address]  Operated by [facility]  Group Note             Name: Jennifer [name]   Date of Birth: 01/27/1976   Today's Date: 12/13/2023   Group Start Time: 10:30 AM   Group End Time: 11:29 AM   Group Topic: Intensive Outpatient Program  Number of participants: 8      Summary of group discussion:   Group shared where they are experiencing physical pain today.  They talked about ways to help manage their pain and how pain relates to their mental health.    Xzpdyj'd Participation and Response: Jennifer [name] said she is in pain everyday and her body hurts all over everyday.  She is in a constant fibro flare.  All her muscles ache.  It hurts when someone touches her.       She is dealing with diabetes, congestive heart failure and retaining fluid.  Her ankles swell and she wears compression socks.         Her anxiety appears to elevate when [place] My Chart notifies her of a therapy appointment.  It is regarding SOP group.  She thought she was missing an appointment.  She continues to get notifications.  She stepped out of the room to check her phone medical notifications 3 times.    Suicidal/Homicidal Risk:  Currently denies SI/HI and expresses willingness to contact crisis services if needed.

## 2023-12-14 ENCOUNTER — Encounter (HOSPITAL_PSYCHIATRIC): Payer: Self-pay

## 2023-12-14 ENCOUNTER — Ambulatory Visit: Attending: Psychiatry

## 2023-12-14 DIAGNOSIS — F411 Generalized anxiety disorder: Secondary | ICD-10-CM | POA: Insufficient documentation

## 2023-12-14 DIAGNOSIS — F419 Anxiety disorder, unspecified: Secondary | ICD-10-CM | POA: Insufficient documentation

## 2023-12-14 DIAGNOSIS — F32A Depression, unspecified: Secondary | ICD-10-CM | POA: Insufficient documentation

## 2023-12-14 NOTE — Group Note (Signed)
 BEHAVIORAL MEDICINE[facility] [place]  [address]  [place] [address]  Operated by [facility]  Group Note             Name: Jennifer [name]   Date of Birth: 04/25/1976   Today's Date: 12/14/2023   Group Start Time: 11:30 AM   Group End Time: 12:30 PM   Group Topic: Intensive Outpatient Program  Number of participants: 8      Summary of group discussion: Group expanded on current and upcoming challenges and solutions.  They included both positive and negative stressors.   All shared in their personal experience and/or shared various problem solving suggestions.  One way discussed in healthy techniques to help manage included I Messages.  Group members shared how they might use this in the future.      Xzpdyj'd Participation and Response: Jennifer [name] shared a divorce she experienced about 3 years ago that was challenging to leave and the leaving itself was challenging.  Her boyfriend of 1 1/2 years is wanting to move to this area and she is encouraging and welcoming him being here.  She is anxious to return to her current home and hoping to be able to get her power turned back on. She added a worry about a friend and the friend being able to get assistance in paying for her medicine.        Suicidal/Homicidal Risk:  Currently denies SI/HI and expresses willingness to contact crisis services if needed.

## 2023-12-14 NOTE — Group Note (Signed)
 BEHAVIORAL MEDICINE[facility] [place]  [address]  [place] [address]  Operated by [facility]  Group Note             Name: Jennifer [name]   Date of Birth: February 12, 1976   Today's Date: 12/14/2023   Group Start Time: 10:30 AM   Group End Time: 11:30 AM   Group Topic: Intensive Outpatient Program  Number of participants: 8      Summary of group discussion:   Group members were encouraged to identify several stressors (big and small) they are currently facing.  They wrote them on strips of paper (brief identification) and began to make a chain out of them.  The chain is representative of how they add up and become heavier with each link.   Goal is  to be able to take out (reduce/eliminate) links in the near future and use coping skills to help manage those that remain.   Each member was given an opportunity to share one of their choosing.  All were active participants.      Xzpdyj'd Participation and Response: [name] is currently living at her mother's home.  There are other people living in the home including a couple of teens, a younger child and adult(s).  There was a situation early this morning of who showers when. She is thankful to be at the home but hurt and frustrated about situations such as showering.  When she was hospitalized last month, it was hard to take a shower.  Now she is having some energy and hot water  to take one. She does not want to go backwards in  her progress by not showering.     Suicidal/Homicidal Risk:  Currently denies SI/HI and expresses willingness to contact crisis services if needed.

## 2023-12-14 NOTE — Group Note (Signed)
 BEHAVIORAL MEDICINE[facility] [place]  [address]  [place] [address]  Operated by [facility]  Group Note             Name: Jennifer [name]   Date of Birth: 23-Nov-1975   Today's Date: 12/14/2023   Group Start Time:  9:30 AM   Group End Time: 10:30 AM   Group Topic: Intensive Outpatient Program  Number of participants: 7      Summary of group discussion:   Group completed the Patient Self Report and documented information of their choosing in their Health Journal (such as current symptoms experiencing, pleasant activities, changes in medication). All ranked their current depression and anxiety on a scale of 1 being low and 10 being high.  All were active participants.     Xzpdyj'd Participation and Response: Anaissa ranked depression as 6 and anxiety as 10.        Suicidal/Homicidal Risk:  Currently denies SI/HI and expresses willingness to contact crisis services if needed.

## 2023-12-18 ENCOUNTER — Ambulatory Visit: Attending: Psychiatry

## 2023-12-18 DIAGNOSIS — F411 Generalized anxiety disorder: Secondary | ICD-10-CM | POA: Insufficient documentation

## 2023-12-18 NOTE — Group Note (Signed)
 BEHAVIORAL MEDICINE[facility] [place]  [address]  [place] [address]  Operated by [facility]  Group Note             Name: Jennifer [name]   Date of Birth: [birth year]   Today's Date: 12/18/2023   Group Start Time: 10:30 AM   Group End Time: 11:30 AM   Group Topic: Intensive Outpatient Program  Number of participants: 9      Summary of group discussion:   Group focus on identifying unhealthy coping skills they have used or are using and what that looks like for them currently.  They were given an opportunity to share what that looks like for them now.  Some are using the skill and some are thinking about it.  Some of these could be identified as current or past addictions.     Xzpdyj'd Participation and Response: Keasia said alcohol is a big problem for her and it has been most all her life. She was very tearful. She uses her cell phone off and on during class.       Suicidal/Homicidal Risk:  Currently denies SI/HI and expresses willingness to contact crisis services if needed.

## 2023-12-18 NOTE — Group Note (Signed)
 BEHAVIORAL MEDICINE[facility] [place]  [address]  [place] [address]  Operated by [facility]  Group Note             Name: Jennifer [name]   Date of Birth: [birth year]   Today's Date: 12/18/2023   Group Start Time:  9:30 AM   Group End Time: 10:30 AM   Group Topic: Intensive Outpatient Program  Number of participants: 9      Summary of group discussion:   Group completed the Self-Report and identified 3 pleasant activities they have experienced since their last group session (3 days ago).  They were encouraged to document their b/p, hr, O2, and wt in their Health Journal.        Group members have been working on a personal goal that would impact their weight number.  They chose if the weight would increase, decrease, or stay the same.  Members were given an opportunity to share what they have or have not been doing to address this personal goal.  All were active participants in documenting and sharing their experiences.    Xzpdyj'd Participation and Response: [name] said her weekend has been long.  She was in [place].  She said it is senseless, a waste of time and pointless to try to loose weight because she is experiencing water    weight.  She has been seen by Dr. Neomi, a cardiologist recently and is taking fluid pills.  She will see Dr. Neomi again, January 2026.  She is encouraged to request a sooner follow-up if she continues to be swollen as is or if it worsens.        Suicidal/Homicidal Risk:  Currently denies SI/HI and expresses willingness to contact crisis services if needed.

## 2023-12-18 NOTE — Group Note (Signed)
 BEHAVIORAL MEDICINE[facility] [place]  [address]  [place] [address]  Operated by [facility]  Group Note             Name: NEAL OSHEA   Date of Birth: 05-16-1976   Today's Date: 12/18/2023   Group Start Time: 11:30 AM   Group End Time: 12:30 PM   Group Topic: Intensive Outpatient Program  Number of participants: 9      Summary of group discussion:   Group discussion focused on healthy coping skills for depression, anxiety, mood dysregulation, OCD, grief, pain, etc.  Most reflected back to a previous session that identified unhealthy coping skills that could also be a hx of or current addictive behavior for when things are not going well.         Most have tx plan reviews due and these were addressed.    Xzpdyj'd Participation and Response: Ellagrace said she does not and has not had those experiences that she can recall at this time.  She continued to use her cell phone off and on.      Suicidal/Homicidal Risk:  Currently denies SI/HI and expresses willingness to contact crisis services if needed.

## 2023-12-20 ENCOUNTER — Emergency Department
Admission: EM | Admit: 2023-12-20 | Discharge: 2023-12-20 | Disposition: A | Discharge status: Home / Self Care | Attending: Physician Assistant | Admitting: Physician Assistant

## 2023-12-20 ENCOUNTER — Other Ambulatory Visit: Payer: Self-pay

## 2023-12-20 ENCOUNTER — Emergency Department (HOSPITAL_COMMUNITY)

## 2023-12-20 ENCOUNTER — Encounter (HOSPITAL_COMMUNITY): Payer: Self-pay

## 2023-12-20 ENCOUNTER — Ambulatory Visit: Attending: Psychiatry

## 2023-12-20 DIAGNOSIS — R0602 Shortness of breath: Secondary | ICD-10-CM

## 2023-12-20 DIAGNOSIS — R600 Localized edema: Secondary | ICD-10-CM

## 2023-12-20 DIAGNOSIS — M25471 Effusion, right ankle: Secondary | ICD-10-CM

## 2023-12-20 DIAGNOSIS — Z79899 Other long term (current) drug therapy: Secondary | ICD-10-CM | POA: Insufficient documentation

## 2023-12-20 DIAGNOSIS — M25472 Effusion, left ankle: Secondary | ICD-10-CM

## 2023-12-20 DIAGNOSIS — I509 Heart failure, unspecified: Secondary | ICD-10-CM | POA: Insufficient documentation

## 2023-12-20 DIAGNOSIS — Z91411 Personal history of adult psychological abuse: Secondary | ICD-10-CM

## 2023-12-20 DIAGNOSIS — Z5986 Financial insecurity: Secondary | ICD-10-CM | POA: Insufficient documentation

## 2023-12-20 DIAGNOSIS — R0600 Dyspnea, unspecified: Secondary | ICD-10-CM

## 2023-12-20 DIAGNOSIS — Z029 Encounter for administrative examinations, unspecified: Secondary | ICD-10-CM

## 2023-12-20 LAB — CBC WITH DIFF
BASOPHIL #: 0.1 x10ˆ3/uL (ref 0.00–0.10)
BASOPHIL %: 1 % (ref 0–1)
EOSINOPHIL #: 0.2 x10ˆ3/uL (ref 0.00–0.50)
EOSINOPHIL %: 2 % (ref 1–7)
HCT: 32.3 % (ref 31.2–41.9)
HGB: 10.8 g/dL — ABNORMAL LOW (ref 10.9–14.3)
LYMPHOCYTE #: 2 x10ˆ3/uL (ref 1.10–3.10)
LYMPHOCYTE %: 23 % (ref 16–46)
MCH: 26.8 pg (ref 24.7–32.8)
MCHC: 33.5 g/dL (ref 32.3–35.6)
MCV: 80.1 fL (ref 75.5–95.3)
MONOCYTE #: 0.8 x10ˆ3/uL (ref 0.20–0.90)
MONOCYTE %: 9 % (ref 4–11)
MPV: 9.8 fL (ref 7.9–10.8)
NEUTROPHIL #: 5.5 x10ˆ3/uL (ref 1.90–8.20)
NEUTROPHIL %: 65 % (ref 43–77)
PLATELETS: 245 x10ˆ3/uL (ref 140–440)
RBC: 4.03 x10ˆ6/uL (ref 3.63–4.92)
RDW: 16 % (ref 12.3–17.7)
WBC: 8.5 x10ˆ3/uL (ref 3.8–11.8)

## 2023-12-20 LAB — COMPREHENSIVE METABOLIC PANEL, NON-FASTING
ALBUMIN/GLOBULIN RATIO: 1.3 (ref 0.8–1.4)
ALBUMIN: 3.6 g/dL (ref 3.5–5.7)
ALKALINE PHOSPHATASE: 94 U/L (ref 34–104)
ALT (SGPT): 22 U/L (ref 7–52)
ANION GAP: 7 mmol/L (ref 4–13)
AST (SGOT): 20 U/L (ref 13–39)
BILIRUBIN TOTAL: 0.3 mg/dL (ref 0.3–1.0)
BUN/CREA RATIO: 12 (ref 6–22)
BUN: 11 mg/dL (ref 7–25)
CALCIUM, CORRECTED: 9 mg/dL (ref 8.9–10.8)
CALCIUM: 8.7 mg/dL (ref 8.6–10.3)
CHLORIDE: 104 mmol/L (ref 98–107)
CO2 TOTAL: 28 mmol/L (ref 21–31)
CREATININE: 0.95 mg/dL (ref 0.60–1.30)
ESTIMATED GFR: 74 mL/min/1.73mˆ2 (ref >59)
GLOBULIN: 2.8 (ref 2.0–3.5)
GLUCOSE: 203 mg/dL — ABNORMAL HIGH (ref 74–109)
OSMOLALITY, CALCULATED: 283 mosm/kg (ref 270–290)
POTASSIUM: 3.6 mmol/L (ref 3.5–5.1)
PROTEIN TOTAL: 6.4 g/dL (ref 6.4–8.9)
SODIUM: 139 mmol/L (ref 136–145)

## 2023-12-20 LAB — ECG 12 LEAD
Atrial Rate: 76 {beats}/min
Calculated P Axis: 78 degrees
Calculated R Axis: 29 degrees
Calculated T Axis: 26 degrees
PR Interval: 156 ms
QRS Duration: 82 ms
QT Interval: 412 ms
QTC Calculation: 463 ms
Ventricular rate: 76 {beats}/min

## 2023-12-20 LAB — B-TYPE NATRIURETIC PEPTIDE (BNP),PLASMA: BNP: 59 pg/mL (ref 1–100)

## 2023-12-20 LAB — TROPONIN-I: TROPONIN I: 4 ng/L (ref <15)

## 2023-12-20 MED ORDER — FUROSEMIDE 10 MG/ML INJECTION SOLUTION
INTRAMUSCULAR | Status: AC
Start: 2023-12-20 — End: 2023-12-20
  Filled 2023-12-20: qty 10

## 2023-12-20 MED ORDER — FUROSEMIDE 10 MG/ML INJECTION SOLUTION
20.0000 mg | INTRAMUSCULAR | Status: AC
Start: 2023-12-20 — End: 2023-12-20
  Administered 2023-12-20: 20 mg via INTRAVENOUS

## 2023-12-20 NOTE — ED Triage Notes (Signed)
 Feet/ankles swollen x2 days.  Hx of congestive heart failure.  Taking lasix , not hellping.

## 2023-12-20 NOTE — ED Nurses Note (Addendum)
 Pt's bed soiled at this time. There is urine noted to be in suction canister from purewick. There is approximately 450 mL noted. Pt's bed cleaned and new linens applied. Pt provided new gown, underwear, and pad. A new purewick placed on pt.

## 2023-12-20 NOTE — Discharge Instructions (Signed)
 I recommend that you begin consistently wearing your compression stockings to help reduce the swelling in your ankles.  Follow up with your PCP for further evaluation and treatment.  Return to the ER for other emergencies or as needed
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
## 2023-12-20 NOTE — ED Nurses Note (Signed)
 Purewick placed on pt at this time. Education provided.

## 2023-12-20 NOTE — ED Nurses Note (Signed)

## 2023-12-21 ENCOUNTER — Ambulatory Visit: Attending: Psychiatry

## 2023-12-21 DIAGNOSIS — Z029 Encounter for administrative examinations, unspecified: Secondary | ICD-10-CM

## 2023-12-25 ENCOUNTER — Ambulatory Visit: Attending: Psychiatry

## 2023-12-25 DIAGNOSIS — Z029 Encounter for administrative examinations, unspecified: Secondary | ICD-10-CM

## 2023-12-25 NOTE — Progress Notes (Signed)
 The patient did not appear for their appointment/or scheduled appointment was cancelled.  This office visit opened in error.

## 2023-12-25 NOTE — Progress Notes (Signed)
 The patient did not appear for their appointment/or scheduled appointment was cancelled.  This office visit opened in error.    The patient did not appear for their appointment/or scheduled appointment was cancelled.  This office visit opened in error.

## 2023-12-27 ENCOUNTER — Ambulatory Visit (HOSPITAL_PSYCHIATRIC)

## 2023-12-28 ENCOUNTER — Ambulatory Visit

## 2024-01-01 ENCOUNTER — Ambulatory Visit

## 2024-01-03 ENCOUNTER — Ambulatory Visit

## 2024-01-04 ENCOUNTER — Ambulatory Visit

## 2024-01-08 ENCOUNTER — Ambulatory Visit (HOSPITAL_PSYCHIATRIC)

## 2024-01-09 ENCOUNTER — Encounter (HOSPITAL_COMMUNITY): Payer: Self-pay

## 2024-01-09 ENCOUNTER — Other Ambulatory Visit: Payer: Self-pay

## 2024-01-09 ENCOUNTER — Emergency Department
Admission: EM | Admit: 2024-01-09 | Discharge: 2024-01-10 | Disposition: A | Discharge status: Home / Self Care | Attending: Emergency Medicine | Admitting: Emergency Medicine

## 2024-01-09 DIAGNOSIS — T378X5A Adverse effect of other specified systemic anti-infectives and antiparasitics, initial encounter: Secondary | ICD-10-CM | POA: Insufficient documentation

## 2024-01-09 DIAGNOSIS — Z136 Encounter for screening for cardiovascular disorders: Secondary | ICD-10-CM

## 2024-01-09 DIAGNOSIS — L299 Pruritus, unspecified: Secondary | ICD-10-CM | POA: Insufficient documentation

## 2024-01-09 DIAGNOSIS — R220 Localized swelling, mass and lump, head: Secondary | ICD-10-CM | POA: Insufficient documentation

## 2024-01-09 DIAGNOSIS — T7840XA Allergy, unspecified, initial encounter: Secondary | ICD-10-CM

## 2024-01-09 DIAGNOSIS — T783XXA Angioneurotic edema, initial encounter: Secondary | ICD-10-CM

## 2024-01-09 LAB — COMPREHENSIVE METABOLIC PANEL, NON-FASTING
ALBUMIN/GLOBULIN RATIO: 1.2 (ref 0.8–1.4)
ALBUMIN: 3.8 g/dL (ref 3.5–5.7)
ALKALINE PHOSPHATASE: 103 U/L (ref 34–104)
ALT (SGPT): 24 U/L (ref 7–52)
ANION GAP: 10 mmol/L (ref 4–13)
AST (SGOT): 23 U/L (ref 13–39)
BILIRUBIN TOTAL: 0.5 mg/dL (ref 0.3–1.0)
BUN/CREA RATIO: 11 (ref 6–22)
BUN: 14 mg/dL (ref 7–25)
CALCIUM, CORRECTED: 8.9 mg/dL (ref 8.9–10.8)
CALCIUM: 8.7 mg/dL (ref 8.6–10.3)
CHLORIDE: 100 mmol/L (ref 98–107)
CO2 TOTAL: 25 mmol/L (ref 21–31)
CREATININE: 1.26 mg/dL (ref 0.60–1.30)
ESTIMATED GFR: 53 mL/min/1.73mˆ2 — ABNORMAL LOW (ref >59)
GLOBULIN: 3.2 (ref 2.0–3.5)
GLUCOSE: 349 mg/dL — ABNORMAL HIGH (ref 74–109)
OSMOLALITY, CALCULATED: 285 mosm/kg (ref 270–290)
POTASSIUM: 3.4 mmol/L — ABNORMAL LOW (ref 3.5–5.1)
PROTEIN TOTAL: 7 g/dL (ref 6.4–8.9)
SODIUM: 135 mmol/L — ABNORMAL LOW (ref 136–145)

## 2024-01-09 LAB — CBC
HCT: 35.3 % (ref 31.2–41.9)
HGB: 11.6 g/dL (ref 10.9–14.3)
MCH: 26.4 pg (ref 24.7–32.8)
MCHC: 32.9 g/dL (ref 32.3–35.6)
MCV: 80.3 fL (ref 75.5–95.3)
MPV: 9.8 fL (ref 7.9–10.8)
PLATELETS: 198 x10ˆ3/uL (ref 140–440)
RBC: 4.39 x10ˆ6/uL (ref 3.63–4.92)
RDW: 15.8 % (ref 12.3–17.7)
WBC: 9.4 x10ˆ3/uL (ref 3.8–11.8)

## 2024-01-09 LAB — POC BLOOD GLUCOSE (RESULTS): GLUCOSE, POC: 340 mg/dL — ABNORMAL HIGH (ref 70–100)

## 2024-01-09 LAB — ECG 12 LEAD
Atrial Rate: 83 {beats}/min
Calculated P Axis: 74 degrees
Calculated R Axis: 39 degrees
Calculated T Axis: 26 degrees
PR Interval: 150 ms
QRS Duration: 82 ms
QT Interval: 386 ms
QTC Calculation: 453 ms
Ventricular rate: 83 {beats}/min

## 2024-01-09 MED ORDER — FAMOTIDINE 20 MG TABLET
20.0000 mg | ORAL_TABLET | ORAL | Status: AC
Start: 2024-01-09 — End: 2024-01-09
  Administered 2024-01-09: 20 mg via ORAL

## 2024-01-09 MED ORDER — PREDNISONE 10 MG TABLET
30.0000 mg | ORAL_TABLET | Freq: Every day | ORAL | 0 refills | Status: AC
Start: 2024-01-09 — End: 2024-01-13

## 2024-01-09 MED ORDER — DIPHENHYDRAMINE 50 MG/ML INJECTION SOLUTION
25.0000 mg | INTRAMUSCULAR | Status: AC
Start: 2024-01-09 — End: 2024-01-09
  Administered 2024-01-09: 25 mg via INTRAVENOUS

## 2024-01-09 MED ORDER — SODIUM CHLORIDE 0.9 % IV BOLUS
1000.0000 mL | INJECTION | Status: AC
Start: 2024-01-09 — End: 2024-01-09
  Administered 2024-01-09: 0 mL via INTRAVENOUS
  Administered 2024-01-09: 1000 mL via INTRAVENOUS

## 2024-01-09 MED ORDER — METHYLPREDNISOLONE SOD SUCC 125 MG SOLUTION FOR INJECTION WRAPPER
125.0000 mg | INTRAVENOUS | Status: AC
Start: 2024-01-09 — End: 2024-01-09
  Administered 2024-01-09: 125 mg via INTRAVENOUS

## 2024-01-09 MED ORDER — DIPHENHYDRAMINE 50 MG/ML INJECTION SOLUTION
INTRAMUSCULAR | Status: AC
Start: 2024-01-09 — End: 2024-01-09
  Filled 2024-01-09: qty 1

## 2024-01-09 MED ORDER — METHYLPREDNISOLONE SOD SUCC 125 MG SOLUTION FOR INJECTION WRAPPER
INTRAVENOUS | Status: AC
Start: 2024-01-09 — End: 2024-01-09
  Filled 2024-01-09: qty 2

## 2024-01-09 MED ORDER — FAMOTIDINE 20 MG TABLET
ORAL_TABLET | ORAL | Status: AC
Start: 2024-01-09 — End: 2024-01-09
  Filled 2024-01-09: qty 1

## 2024-01-09 MED ORDER — PREDNISONE 10 MG TABLET
30.0000 mg | ORAL_TABLET | Freq: Every day | ORAL | 0 refills | Status: DC
Start: 2024-01-09 — End: 2024-01-09

## 2024-01-09 NOTE — Discharge Instructions (Addendum)
 Prednisone  as directed until gone.    Benadryl  50 mg every 4-6 hours as needed for swelling or rash.    Return to emergency department if worse and as needed.

## 2024-01-09 NOTE — ED APP Handoff Note (Signed)
 [facility] - Emergency Department    Jennifer [name] is a 48 y.o. female presenting to the ED today for:  Allergic reaction.  Patient reports that she is placed on Flagyl for an upper respiratory infection and diarrhea.  Patient states that she also ate salmon this evening however she feels that her lips were swelling and she is having difficulty swallowing/breathing.  Patient reports that she took 25 mg of Benadryl  at home.  Patient also reports that she is itching.    Additional History:   Past Medical History:   Diagnosis Date    Asthma     COPD (chronic obstructive pulmonary disease)     Diabetes mellitus, type 2     HTN (hypertension)     Narcolepsy     OSA (obstructive sleep apnea)     CPAP    Thyroid  disease          Past Surgical History:   Procedure Laterality Date    HX CHOLECYSTECTOMY      HX TUBAL LIGATION      SHOULDER SURGERY Bilateral            Pertinent Exam findings:  Filed Vitals:    01/09/24 2201   BP: (!) 196/85   Pulse: 98   Resp: 20   Temp: 36.4 C (97.5 F)   SpO2: 98%       Gen: Nontoxic in appearance.  Head: NC/AT  Neck: Trachea midline. No gross meningismus.  Psych: Denis SI/HI.    Assessment: Medical Screening Exam.    Plan/MDM at Triage:  I have considered labs, imaging, EKG.  These have been ordered as clinically indicated.  I have considered that the patient may need medications.  However, due to treatment space limitation, medications considered have to be appropriate for the waiting room.    I have considered the patient's chronic conditions and have counseled the patient that we will be evaluating for the presence of an emergency medical condition today in which their chronic conditions may or may not play a role.  I have advised that the patient should notify their PCP of today's visit regardless of disposition.  I have considered the patient's social determinants of health and health care literacy while determining the patient's initial plan of care.  I have  further advised that the patient will possibly see a 2nd emergency provider today during their stay to help promote throughput.    I have performed an initial medical screening evaluation in order to determine if the patient has an emergency medical condition. The patient has been evaluated.  Imminent life, limb, or sight threatening emergency will be taken to the treatment area immediately. The patient's orders will be reviewed by nursing staff and placed in the treatment area as soon as possible as bed capacity allows.  Should the patient's status change, or a new sign/symptom develop, immediate notification of nursing or ED staff has been advised.

## 2024-01-09 NOTE — ED Triage Notes (Signed)
 Possible allergic reaction. Pt's lips and throat are swollen, itching all over body. Pt started taking metronidazole at 1600. Pt states she also ate salmon patties so unsure exactly what caused reaction.

## 2024-01-09 NOTE — ED Provider Notes (Signed)
 [facility] - Emergency Department  ED Primary Note  History of Present Illness   Jennifer [name] is a 48 y.o. female who had concerns including Allergic reaction.  The patient states she thinks she may be having allergic reaction to metronidazole.  She states she started metronidazole today.  She states she also ate salmon patties.  She states this is not a new exposure as she is standing pad he is often.  She states she has not taken metronidazole for very long time.    Physical Exam   ED Triage Vitals [01/09/24 2201]   BP (Non-Invasive) (!) 196/85   Heart Rate 98   Respiratory Rate 20   Temperature 36.4 C (97.5 F)   SpO2 98 %   Weight 118 kg (260 lb)   Height 1.702 m (5' 7)     Physical Exam  Vitals and nursing note reviewed.   Constitutional:       Appearance: Normal appearance.   HENT:      Head: Normocephalic and atraumatic.      Mouth/Throat:      Comments: Angioedema of the lips and tongue  Cardiovascular:      Rate and Rhythm: Normal rate and regular rhythm.      Pulses: Normal pulses.      Heart sounds: Normal heart sounds.   Pulmonary:      Effort: Pulmonary effort is normal.      Breath sounds: Normal breath sounds.   Abdominal:      General: Abdomen is flat. Bowel sounds are normal.      Palpations: Abdomen is soft.   Neurological:      Mental Status: She is alert.       Patient Data   Labs Ordered/Reviewed   COMPREHENSIVE METABOLIC PANEL, NON-FASTING - Abnormal; Notable for the following components:       Result Value    SODIUM 135 (*)     POTASSIUM 3.4 (*)     ESTIMATED GFR 53 (*)     GLUCOSE 349 (*)     All other components within normal limits    Narrative:     Estimated Glomerular Filtration Rate (eGFR) is calculated using the CKD-EPI (2021) equation, intended for patients 61 years of age and older. If gender is not documented or unknown, there will be no eGFR calculation.     POC BLOOD GLUCOSE (RESULTS) - Abnormal; Notable for the following components:    GLUCOSE, POC 340  (*)     All other components within normal limits   CBC - Normal     No orders to display     Medical Decision Making        Medical Decision Making  The patient is into the ED with a likely allergic reaction to metronidazole.  She was evaluated by the provider in triage.  She was given Solu-Medrol , famotidine , and Benadryl .  The swelling has decreased.  The patient states she feels better.  She will be discharged home with a prednisone .    Risk  Prescription drug management.                Medications Ordered/Administered in the ED   NS bolus infusion 1,000 mL (1,000 mL Intravenous New Bag/New Syringe 01/09/24 2234)   famotidine  (PEPCID ) tablet (20 mg Oral Given 01/09/24 2235)   methylPREDNISolone  sod succ (SOLU-medrol ) 125 mg/2 mL injection (125 mg Intravenous Given 01/09/24 2234)   diphenhydrAMINE  (BENADRYL ) 50 mg/mL injection (25 mg Intravenous Given  01/09/24 2234)     Clinical Impression   Allergic reaction to drug, initial encounter (Primary)       Disposition: Discharged

## 2024-01-10 ENCOUNTER — Ambulatory Visit (HOSPITAL_PSYCHIATRIC)

## 2024-01-10 NOTE — ED Nurses Note (Addendum)
 Pt left before receiving new prescription, this nurse called and left a message on pt voice mail informing her I had her new prescription.

## 2024-01-10 NOTE — ED Nurses Note (Signed)
 At this time pt requesting new pharmacy location for prescription, this nurse informed pt i would talk to provider and be right back.

## 2024-01-10 NOTE — Nurses Notes (Signed)
 Pt called stating her rx was not sent in... She also requested [facility] be removed from her chart. I removed that pharmacy and called the rx to [facility]

## 2024-01-10 NOTE — ED Nurses Note (Signed)
 Patient discharged home with family.  AVS reviewed with patient/care giver.  A written copy of the AVS and discharge instructions was given to the patient/care giver.  Questions sufficiently answered as needed.  Patient/care giver encouraged to follow up with PCP as indicated.  In the event of an emergency, patient/care giver instructed to call 911 or go to the nearest emergency room.  Pt iv removed.

## 2024-01-11 ENCOUNTER — Ambulatory Visit
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
## 2024-01-15 ENCOUNTER — Ambulatory Visit (HOSPITAL_COMMUNITY): Payer: Self-pay | Admitting: SLEEP MEDICINE

## 2024-01-15 ENCOUNTER — Ambulatory Visit

## 2024-01-17 ENCOUNTER — Ambulatory Visit (HOSPITAL_PSYCHIATRIC)

## 2024-01-18 ENCOUNTER — Ambulatory Visit

## 2024-01-22 ENCOUNTER — Telehealth (HOSPITAL_PSYCHIATRIC): Payer: Self-pay | Admitting: Psychiatry

## 2024-01-22 ENCOUNTER — Ambulatory Visit

## 2024-01-22 NOTE — Telephone Encounter (Signed)
 Is requesting discharge from [facility] if you would be able to put in discharge orders.  Thank you.

## 2024-01-24 ENCOUNTER — Ambulatory Visit

## 2024-02-05 ENCOUNTER — Emergency Department (HOSPITAL_COMMUNITY)

## 2024-02-05 ENCOUNTER — Other Ambulatory Visit: Payer: Self-pay

## 2024-02-05 ENCOUNTER — Emergency Department
Admission: EM | Admit: 2024-02-05 | Discharge: 2024-02-05 | Disposition: A | Discharge status: Home / Self Care | Source: Home / Self Care | Attending: NURSE PRACTITIONER | Admitting: NURSE PRACTITIONER

## 2024-02-05 DIAGNOSIS — R079 Chest pain, unspecified: Secondary | ICD-10-CM

## 2024-02-05 DIAGNOSIS — Z87891 Personal history of nicotine dependence: Secondary | ICD-10-CM

## 2024-02-05 DIAGNOSIS — I10 Essential (primary) hypertension: Secondary | ICD-10-CM | POA: Insufficient documentation

## 2024-02-05 DIAGNOSIS — E876 Hypokalemia: Secondary | ICD-10-CM

## 2024-02-05 DIAGNOSIS — R0600 Dyspnea, unspecified: Secondary | ICD-10-CM

## 2024-02-05 DIAGNOSIS — J441 Chronic obstructive pulmonary disease with (acute) exacerbation: Secondary | ICD-10-CM

## 2024-02-05 DIAGNOSIS — R7402 Elevation of levels of lactic acid dehydrogenase (LDH): Secondary | ICD-10-CM | POA: Insufficient documentation

## 2024-02-05 DIAGNOSIS — R630 Anorexia: Secondary | ICD-10-CM | POA: Insufficient documentation

## 2024-02-05 DIAGNOSIS — R110 Nausea: Secondary | ICD-10-CM | POA: Insufficient documentation

## 2024-02-05 DIAGNOSIS — R9431 Abnormal electrocardiogram [ECG] [EKG]: Secondary | ICD-10-CM

## 2024-02-05 DIAGNOSIS — E119 Type 2 diabetes mellitus without complications: Secondary | ICD-10-CM | POA: Insufficient documentation

## 2024-02-05 DIAGNOSIS — R0989 Other specified symptoms and signs involving the circulatory and respiratory systems: Secondary | ICD-10-CM

## 2024-02-05 DIAGNOSIS — R059 Cough, unspecified: Secondary | ICD-10-CM

## 2024-02-05 DIAGNOSIS — R42 Dizziness and giddiness: Secondary | ICD-10-CM | POA: Insufficient documentation

## 2024-02-05 DIAGNOSIS — E079 Disorder of thyroid, unspecified: Secondary | ICD-10-CM

## 2024-02-05 DIAGNOSIS — R7989 Other specified abnormal findings of blood chemistry: Secondary | ICD-10-CM | POA: Insufficient documentation

## 2024-02-05 LAB — COVID-19, FLU A/B, RSV RAPID BY PCR
INFLUENZA VIRUS TYPE A: NOT DETECTED
INFLUENZA VIRUS TYPE B: NOT DETECTED
RESPIRATORY SYNCTIAL VIRUS (RSV): NOT DETECTED
SARS-CoV-2: NOT DETECTED

## 2024-02-05 LAB — CBC WITH DIFF
BASOPHIL #: 0 x10ˆ3/uL (ref 0.00–0.10)
BASOPHIL %: 1 % (ref 0–1)
EOSINOPHIL #: 0.1 x10ˆ3/uL (ref 0.00–0.50)
EOSINOPHIL %: 1 % (ref 1–7)
HCT: 34.1 % (ref 31.2–41.9)
HGB: 11.4 g/dL (ref 10.9–14.3)
LYMPHOCYTE #: 2.2 x10ˆ3/uL (ref 1.10–3.10)
LYMPHOCYTE %: 30 % (ref 16–46)
MCH: 27 pg (ref 24.7–32.8)
MCHC: 33.6 g/dL (ref 32.3–35.6)
MCV: 80.4 fL (ref 75.5–95.3)
MONOCYTE #: 0.4 x10ˆ3/uL (ref 0.20–0.90)
MONOCYTE %: 5 % (ref 4–11)
MPV: 9.4 fL (ref 7.9–10.8)
NEUTROPHIL #: 4.8 x10ˆ3/uL (ref 1.90–8.20)
NEUTROPHIL %: 63 % (ref 43–77)
PLATELETS: 250 x10ˆ3/uL (ref 140–440)
RBC: 4.24 x10ˆ6/uL (ref 3.63–4.92)
RDW: 16.7 % (ref 12.3–17.7)
WBC: 7.5 x10ˆ3/uL (ref 3.8–11.8)

## 2024-02-05 LAB — THYROID STIMULATING HORMONE (SENSITIVE TSH): TSH: 28.988 u[IU]/mL — ABNORMAL HIGH (ref 0.450–5.330)

## 2024-02-05 LAB — COMPREHENSIVE METABOLIC PANEL, NON-FASTING
ALBUMIN/GLOBULIN RATIO: 1.3 (ref 0.8–1.4)
ALBUMIN: 3.8 g/dL (ref 3.5–5.7)
ALKALINE PHOSPHATASE: 85 U/L (ref 34–104)
ALT (SGPT): 19 U/L (ref 7–52)
ANION GAP: 8 mmol/L (ref 4–13)
AST (SGOT): 18 U/L (ref 13–39)
BILIRUBIN TOTAL: 0.5 mg/dL (ref 0.3–1.0)
BUN/CREA RATIO: 9 (ref 6–22)
BUN: 7 mg/dL (ref 7–25)
CALCIUM, CORRECTED: 8.6 mg/dL — ABNORMAL LOW (ref 8.9–10.8)
CALCIUM: 8.4 mg/dL — ABNORMAL LOW (ref 8.6–10.3)
CHLORIDE: 105 mmol/L (ref 98–107)
CO2 TOTAL: 23 mmol/L (ref 21–31)
CREATININE: 0.75 mg/dL (ref 0.60–1.30)
ESTIMATED GFR: 98 mL/min/1.73mˆ2 (ref >59)
GLOBULIN: 2.9 (ref 2.0–3.5)
GLUCOSE: 266 mg/dL — ABNORMAL HIGH (ref 74–109)
OSMOLALITY, CALCULATED: 279 mosm/kg (ref 270–290)
POTASSIUM: 3.3 mmol/L — ABNORMAL LOW (ref 3.5–5.1)
PROTEIN TOTAL: 6.7 g/dL (ref 6.4–8.9)
SODIUM: 136 mmol/L (ref 136–145)

## 2024-02-05 LAB — ECG 12 LEAD
Atrial Rate: 97 {beats}/min
Calculated P Axis: 68 degrees
Calculated R Axis: 19 degrees
Calculated T Axis: 40 degrees
PR Interval: 144 ms
QRS Duration: 74 ms
QT Interval: 364 ms
QTC Calculation: 462 ms
Ventricular rate: 97 {beats}/min

## 2024-02-05 LAB — PT/INR
INR: 0.96 (ref 0.84–1.10)
PROTHROMBIN TIME: 10.9 s (ref 9.8–12.7)

## 2024-02-05 LAB — TROPONIN-I
TROPONIN I: 3 ng/L (ref <15)
TROPONIN I: 4 ng/L (ref <15)
TROPONIN I: 4 ng/L (ref <15)

## 2024-02-05 LAB — LACTIC ACID - FIRST REFLEX: LACTIC ACID: 2.8 mmol/L — ABNORMAL HIGH (ref 0.5–2.2)

## 2024-02-05 LAB — MAGNESIUM: MAGNESIUM: 1.9 mg/dL (ref 1.9–2.7)

## 2024-02-05 LAB — LACTIC ACID LEVEL W/ REFLEX FOR LEVEL >2.0: LACTIC ACID: 2.7 mmol/L — ABNORMAL HIGH (ref 0.5–2.2)

## 2024-02-05 LAB — PTT (PARTIAL THROMBOPLASTIN TIME): APTT: 25.2 s (ref 25.0–38.0)

## 2024-02-05 LAB — B-TYPE NATRIURETIC PEPTIDE (BNP),PLASMA: BNP: 109 pg/mL — ABNORMAL HIGH (ref 1–100)

## 2024-02-05 MED ORDER — POTASSIUM CHLORIDE ER 20 MEQ TABLET,EXTENDED RELEASE(PART/CRYST)
20.0000 meq | ORAL_TABLET | ORAL | Status: AC
Start: 2024-02-05 — End: 2024-02-05
  Administered 2024-02-05: 20 meq via ORAL

## 2024-02-05 MED ORDER — DOXYCYCLINE HYCLATE 100 MG CAPSULE
100.0000 mg | ORAL_CAPSULE | Freq: Two times a day (BID) | ORAL | 0 refills | Status: DC
Start: 2024-02-05 — End: 2024-02-20

## 2024-02-05 MED ORDER — IOPAMIDOL 370 MG IODINE/ML (76 %) INTRAVENOUS SOLUTION
100.0000 mL | INTRAVENOUS | Status: AC
Start: 2024-02-05 — End: 2024-02-05
  Administered 2024-02-05: 100 mL via INTRAVENOUS

## 2024-02-05 MED ORDER — ALBUTEROL SULFATE 2.5 MG/3 ML (0.083 %) SOLUTION FOR NEBULIZATION
2.5000 mg | INHALATION_SOLUTION | RESPIRATORY_TRACT | 0 refills | Status: DC | PRN
Start: 2024-02-05 — End: 2024-02-05

## 2024-02-05 MED ORDER — IBUPROFEN 800 MG TABLET
800.0000 mg | ORAL_TABLET | ORAL | Status: DC
Start: 2024-02-05 — End: 2024-02-05

## 2024-02-05 MED ORDER — POTASSIUM CHLORIDE ER 20 MEQ TABLET,EXTENDED RELEASE(PART/CRYST)
ORAL_TABLET | ORAL | Status: AC
Start: 2024-02-05 — End: 2024-02-05
  Filled 2024-02-05: qty 1

## 2024-02-05 MED ORDER — IPRATROPIUM 0.5 MG-ALBUTEROL 3 MG (2.5 MG BASE)/3 ML NEBULIZATION SOLN
3.0000 mL | INHALATION_SOLUTION | RESPIRATORY_TRACT | Status: AC
Start: 2024-02-05 — End: 2024-02-05
  Administered 2024-02-05: 3 mL via RESPIRATORY_TRACT

## 2024-02-05 MED ORDER — KETOROLAC 30 MG/ML (1 ML) INJECTION SOLUTION
INTRAMUSCULAR | Status: AC
Start: 2024-02-05 — End: 2024-02-05
  Filled 2024-02-05: qty 1

## 2024-02-05 MED ORDER — METHYLPREDNISOLONE SOD SUCC 125 MG SOLUTION FOR INJECTION WRAPPER
INTRAVENOUS | Status: AC
Start: 2024-02-05 — End: 2024-02-05
  Filled 2024-02-05: qty 2

## 2024-02-05 MED ORDER — KETOROLAC 30 MG/ML (1 ML) INJECTION SOLUTION
30.0000 mg | INTRAMUSCULAR | Status: AC
Start: 2024-02-05 — End: 2024-02-05
  Administered 2024-02-05: 30 mg via INTRAVENOUS

## 2024-02-05 MED ORDER — METHYLPREDNISOLONE SOD SUCC 125 MG SOLUTION FOR INJECTION WRAPPER
125.0000 mg | INTRAVENOUS | Status: DC
Start: 2024-02-05 — End: 2024-02-05

## 2024-02-05 MED ORDER — METHYLPREDNISOLONE SOD SUCC 125 MG SOLUTION FOR INJECTION WRAPPER
125.0000 mg | INTRAVENOUS | Status: AC
Start: 2024-02-05 — End: 2024-02-05
  Administered 2024-02-05: 125 mg via INTRAVENOUS

## 2024-02-05 MED ORDER — IPRATROPIUM 0.5 MG-ALBUTEROL 3 MG (2.5 MG BASE)/3 ML NEBULIZATION SOLN
3.0000 mL | INHALATION_SOLUTION | RESPIRATORY_TRACT | 0 refills | Status: AC | PRN
Start: 2024-02-05 — End: ?

## 2024-02-05 MED ORDER — IBUPROFEN 800 MG TABLET
ORAL_TABLET | ORAL | Status: AC
Start: 2024-02-05 — End: 2024-02-05
  Filled 2024-02-05: qty 1

## 2024-02-05 NOTE — ED Nurses Note (Signed)
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
## 2024-02-05 NOTE — ED Triage Notes (Signed)
 Chest pain all day, dizziness now.

## 2024-02-05 NOTE — Discharge Instructions (Signed)
 Continue to monitor symptoms. Take medications as prescribed.  Make sure you are staying hydrated with plenty of fluids.  Get plenty of rest.  Follow-up with your family doctor within 48-72 hours.  Return to ER immediately for any new or worsening symptoms.    Please ensure all questions or concerns are addressed prior to leaving the hospital. We want to make sure your concerns are addressed to make sure you are as safe and healthy as possible. By leaving the hospital, it is understood you are in agreement with your treatment plan.    Please discuss all medications with your pharmacist to ensure there are no concerns of interactions.    Thank you for allowing [place] to be part of your care.

## 2024-02-06 ENCOUNTER — Emergency Department (HOSPITAL_COMMUNITY)

## 2024-02-06 ENCOUNTER — Emergency Department
Admission: EM | Admit: 2024-02-06 | Discharge: 2024-02-06 | Disposition: A | Discharge status: Home / Self Care | Attending: NURSE PRACTITIONER | Admitting: NURSE PRACTITIONER

## 2024-02-06 ENCOUNTER — Encounter (HOSPITAL_COMMUNITY): Payer: Self-pay

## 2024-02-06 DIAGNOSIS — I10 Essential (primary) hypertension: Secondary | ICD-10-CM

## 2024-02-06 DIAGNOSIS — E1165 Type 2 diabetes mellitus with hyperglycemia: Secondary | ICD-10-CM

## 2024-02-06 DIAGNOSIS — J441 Chronic obstructive pulmonary disease with (acute) exacerbation: Secondary | ICD-10-CM | POA: Insufficient documentation

## 2024-02-06 DIAGNOSIS — J449 Chronic obstructive pulmonary disease, unspecified: Secondary | ICD-10-CM

## 2024-02-06 DIAGNOSIS — E079 Disorder of thyroid, unspecified: Secondary | ICD-10-CM | POA: Insufficient documentation

## 2024-02-06 DIAGNOSIS — I160 Hypertensive urgency: Secondary | ICD-10-CM

## 2024-02-06 DIAGNOSIS — Z87891 Personal history of nicotine dependence: Secondary | ICD-10-CM

## 2024-02-06 DIAGNOSIS — R000 Tachycardia, unspecified: Secondary | ICD-10-CM | POA: Insufficient documentation

## 2024-02-06 HISTORY — DX: Heart failure, unspecified: I50.9

## 2024-02-06 LAB — CBC WITH DIFF
BASOPHIL #: 0.1 x10ˆ3/uL (ref 0.00–0.10)
BASOPHIL %: 0 % (ref 0–1)
EOSINOPHIL #: 0 x10ˆ3/uL (ref 0.00–0.50)
EOSINOPHIL %: 0 % — ABNORMAL LOW (ref 1–7)
HCT: 33.7 % (ref 31.2–41.9)
HGB: 11.3 g/dL (ref 10.9–14.3)
LYMPHOCYTE #: 1.2 x10ˆ3/uL (ref 1.10–3.10)
LYMPHOCYTE %: 8 % — ABNORMAL LOW (ref 16–46)
MCH: 27.2 pg (ref 24.7–32.8)
MCHC: 33.6 g/dL (ref 32.3–35.6)
MCV: 81 fL (ref 75.5–95.3)
MONOCYTE #: 0.9 x10ˆ3/uL (ref 0.20–0.90)
MONOCYTE %: 6 % (ref 4–11)
MPV: 9.6 fL (ref 7.9–10.8)
NEUTROPHIL #: 11.9 x10ˆ3/uL — ABNORMAL HIGH (ref 1.90–8.20)
NEUTROPHIL %: 85 % — ABNORMAL HIGH (ref 43–77)
PLATELETS: 237 x10ˆ3/uL (ref 140–440)
RBC: 4.16 x10ˆ6/uL (ref 3.63–4.92)
RDW: 16.1 % (ref 12.3–17.7)
WBC: 14 x10ˆ3/uL — ABNORMAL HIGH (ref 3.8–11.8)

## 2024-02-06 LAB — COMPREHENSIVE METABOLIC PANEL, NON-FASTING
ALBUMIN/GLOBULIN RATIO: 1.3 (ref 0.8–1.4)
ALBUMIN: 4 g/dL (ref 3.5–5.7)
ALKALINE PHOSPHATASE: 77 U/L (ref 34–104)
ALT (SGPT): 18 U/L (ref 7–52)
ANION GAP: 13 mmol/L (ref 4–13)
AST (SGOT): 16 U/L (ref 13–39)
BILIRUBIN TOTAL: 0.6 mg/dL (ref 0.3–1.0)
BUN/CREA RATIO: 18 (ref 6–22)
BUN: 21 mg/dL (ref 7–25)
CALCIUM, CORRECTED: 9.4 mg/dL (ref 8.9–10.8)
CALCIUM: 9.4 mg/dL (ref 8.6–10.3)
CHLORIDE: 103 mmol/L (ref 98–107)
CO2 TOTAL: 19 mmol/L — ABNORMAL LOW (ref 21–31)
CREATININE: 1.17 mg/dL (ref 0.60–1.30)
ESTIMATED GFR: 58 mL/min/1.73mˆ2 — ABNORMAL LOW (ref >59)
GLOBULIN: 3 (ref 2.0–3.5)
GLUCOSE: 365 mg/dL — ABNORMAL HIGH (ref 74–109)
OSMOLALITY, CALCULATED: 288 mosm/kg (ref 270–290)
POTASSIUM: 3.9 mmol/L (ref 3.5–5.1)
PROTEIN TOTAL: 7 g/dL (ref 6.4–8.9)
SODIUM: 135 mmol/L — ABNORMAL LOW (ref 136–145)

## 2024-02-06 LAB — MAGNESIUM: MAGNESIUM: 1.9 mg/dL (ref 1.9–2.7)

## 2024-02-06 LAB — TROPONIN-I: TROPONIN I: 5 ng/L (ref <15)

## 2024-02-06 LAB — POC BLOOD GLUCOSE (RESULTS): GLUCOSE, POC: 271 mg/dL — ABNORMAL HIGH (ref 70–100)

## 2024-02-06 MED ORDER — TRAMADOL 50 MG TABLET
ORAL_TABLET | ORAL | Status: AC
Start: 2024-02-06 — End: 2024-02-06
  Filled 2024-02-06: qty 1

## 2024-02-06 MED ORDER — SODIUM CHLORIDE 0.9% FLUSH BAG - 250 ML
INTRAVENOUS | Status: DC | PRN
Start: 2024-02-06 — End: 2024-02-06

## 2024-02-06 MED ORDER — SODIUM CHLORIDE 0.9 % (FLUSH) INJECTION SYRINGE
3.0000 mL | INJECTION | INTRAMUSCULAR | Status: DC | PRN
Start: 2024-02-06 — End: 2024-02-06

## 2024-02-06 MED ORDER — METOPROLOL TARTRATE 5 MG/5 ML INTRAVENOUS SOLUTION
5.0000 mg | INTRAVENOUS | Status: DC
Start: 2024-02-06 — End: 2024-02-06

## 2024-02-06 MED ORDER — INSULIN REGULAR HUMAN 100 UNIT/ML INJECTION - CHARGE BY DOSE
5.0000 [IU] | INTRAMUSCULAR | Status: AC
Start: 2024-02-06 — End: 2024-02-06
  Administered 2024-02-06: 5 [IU] via SUBCUTANEOUS

## 2024-02-06 MED ORDER — DEXTROSE 5% IN WATER (D5W) FLUSH BAG - 250 ML
INTRAVENOUS | Status: DC | PRN
Start: 2024-02-06 — End: 2024-02-06

## 2024-02-06 MED ORDER — CLONIDINE HCL 0.1 MG TABLET
0.1000 mg | ORAL_TABLET | ORAL | Status: AC
Start: 2024-02-06 — End: 2024-02-06
  Administered 2024-02-06: 0.1 mg via ORAL

## 2024-02-06 MED ORDER — INSULIN REGULAR HUMAN 100 UNIT/ML INJ FOR MIXTURES -RX ADMIN UNIT ROUNDS TO NEAREST 0.01 ML
INTRAMUSCULAR | Status: AC
Start: 2024-02-06 — End: 2024-02-06
  Filled 2024-02-06: qty 5

## 2024-02-06 MED ORDER — SODIUM CHLORIDE 0.9 % (FLUSH) INJECTION SYRINGE
3.0000 mL | INJECTION | Freq: Three times a day (TID) | INTRAMUSCULAR | Status: DC
Start: 2024-02-06 — End: 2024-02-06

## 2024-02-06 MED ORDER — CLONIDINE HCL 0.1 MG TABLET
ORAL_TABLET | ORAL | Status: AC
Start: 2024-02-06 — End: 2024-02-06
  Filled 2024-02-06: qty 1

## 2024-02-06 MED ORDER — TRAMADOL 50 MG TABLET
50.0000 mg | ORAL_TABLET | ORAL | Status: AC
Start: 2024-02-06 — End: 2024-02-06
  Administered 2024-02-06: 50 mg via ORAL
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
## 2024-02-06 NOTE — ED Nurses Note (Signed)
 Patient states was seen here on 9/7 and followed up with her PCP yesterday 9/8. During follow up was told to come to ER d/t hypertension. Patient states BP was taken multiple different times during visit with no change. Patient went home after visit to clean and cook, when she started to develop 10/10 migraine. Patient then checked BP at home and states systolic was 212. Past hx COPD and CHF. Takes multiple different medications prescribed by cardiologist for hypertension. Patient states took meds before coming to ER.     Patient visibly upset. States I feel like I never get a break. People know I do not feel good and expect me to go, go, go. Patient requesting no family members allowed back at bedside, security made aware.    Patient currently c/o chest heaviness and headache worse than a migraine. Patient also feeling SOB, but states is SOB at baseline. Patient on cardiac monitor. Light turned off and door shut, per patient request. Call bell within reach.

## 2024-02-06 NOTE — ED Nurses Note (Signed)

## 2024-02-06 NOTE — ED Triage Notes (Signed)
 Headache, high blood pressure.  Seen here last night for chest pain.  Seen by PCP today for hypertension, states doctor wanted to send her here then, but she declined.

## 2024-02-06 NOTE — Discharge Instructions (Signed)
 Continue to monitor symptoms. Take medications as prescribed.  Make sure you are staying hydrated with plenty of fluids.  Get plenty of rest.  Follow-up with your family doctor within 48-72 hours.  Return to ER immediately for any new or worsening symptoms.    Please ensure all questions or concerns are addressed prior to leaving the hospital. We want to make sure your concerns are addressed to make sure you are as safe and healthy as possible. By leaving the hospital, it is understood you are in agreement with your treatment plan.    Please discuss all medications with your pharmacist to ensure there are no concerns of interactions.    Thank you for allowing [place] to be part of your care.

## 2024-02-07 DIAGNOSIS — R000 Tachycardia, unspecified: Secondary | ICD-10-CM

## 2024-02-07 LAB — ECG 12 LEAD
Atrial Rate: 108 {beats}/min
Calculated P Axis: 77 degrees
Calculated R Axis: 57 degrees
Calculated T Axis: 51 degrees
PR Interval: 154 ms
QRS Duration: 74 ms
QT Interval: 340 ms
QTC Calculation: 455 ms
Ventricular rate: 108 {beats}/min

## 2024-02-07 NOTE — ED Notes (Signed)
 [facility] - Emergency Department  Peer Recovery Coach Assessment    Initial Evaluation  Referred by:: Nurse  Location of Evaluation: Emergency Department  How many times in the last 12 months have you been to the ED?: 6 or more  Have you ever served or are you currently serving in the Armed Forces?: No             Substance Use History  Patient current substance use status: Missed patient from 9/9 @1 :20am. Patient self disclosed marijuana use and alcohol use scoring 12 on Audit C.              Within the last 30 days, what substances has the patient used?: Marijuana, Alcohol                   Family, Social, Home & Safety History                                                 Employment            Engagement  Summary of assessment priority areas: Substance abuse treatment  Summary of assessment priority areas: comments: Missed patient from 9/9 @1 :20am. Patient self disclosed marijuana use and alcohol use scoring 12 on Audit C.    Brief Intervention  Discussed plan to reduce/quit substance use?: No  Discussed willingness to enter treatment?: No  Indicated patient's stage of change:: 1 - Precontemplation    Patient seen by Peer Recovery Coach and is a candidate for buprenorphine administration in the ED. Patient needs assessment for bup treatment.: No    Plan  Was the patient referred to treatment?: No    Was patient referred to physician for Buprenorphine Assessment in the ED?: No    Did patient receive Narcan in the ED?: No    Plan: Additional Comments: Missed patient from 9/9 @1 :20am. Patient self disclosed marijuana use and alcohol use scoring 12 on Audit C.    Follow-up           Need for additional follow-up?: No       Suzen Satchel, Peer Recovery Coach 02/07/2024 08:51

## 2024-02-20 ENCOUNTER — Other Ambulatory Visit: Payer: Self-pay

## 2024-02-20 ENCOUNTER — Ambulatory Visit: Attending: SLEEP MEDICINE | Admitting: SLEEP MEDICINE

## 2024-02-20 VITALS — BP 140/74 | HR 85 | Temp 98.1°F | Resp 20 | Ht 67.0 in | Wt 267.0 lb

## 2024-02-20 DIAGNOSIS — F1020 Alcohol dependence, uncomplicated: Secondary | ICD-10-CM

## 2024-02-20 DIAGNOSIS — J449 Chronic obstructive pulmonary disease, unspecified: Secondary | ICD-10-CM | POA: Insufficient documentation

## 2024-02-20 DIAGNOSIS — F419 Anxiety disorder, unspecified: Secondary | ICD-10-CM | POA: Insufficient documentation

## 2024-02-20 DIAGNOSIS — E119 Type 2 diabetes mellitus without complications: Secondary | ICD-10-CM | POA: Insufficient documentation

## 2024-02-20 DIAGNOSIS — G4733 Obstructive sleep apnea (adult) (pediatric): Secondary | ICD-10-CM | POA: Insufficient documentation

## 2024-02-20 DIAGNOSIS — I10 Essential (primary) hypertension: Secondary | ICD-10-CM | POA: Insufficient documentation

## 2024-02-20 DIAGNOSIS — G4710 Hypersomnia, unspecified: Secondary | ICD-10-CM | POA: Insufficient documentation

## 2024-02-20 DIAGNOSIS — J4520 Mild intermittent asthma, uncomplicated: Secondary | ICD-10-CM | POA: Insufficient documentation

## 2024-02-20 MED ORDER — INCRUSE ELLIPTA 62.5 MCG/ACTUATION POWDER FOR INHALATION
1.0000 | DISK | Freq: Every day | RESPIRATORY_TRACT | 2 refills | Status: AC
Start: 2024-02-20 — End: ?

## 2024-02-20 MED ORDER — IPRATROPIUM 0.5 MG-ALBUTEROL 3 MG (2.5 MG BASE)/3 ML NEBULIZATION SOLN
3.0000 mL | INHALATION_SOLUTION | Freq: Four times a day (QID) | RESPIRATORY_TRACT | 5 refills | Status: AC
Start: 2024-02-20 — End: ?

## 2024-02-20 MED ORDER — DULERA 100 MCG-5 MCG/ACTUATION HFA AEROSOL INHALER
2.0000 | INHALATION_SPRAY | Freq: Two times a day (BID) | RESPIRATORY_TRACT | 5 refills | Status: AC
Start: 2024-02-20 — End: ?
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
## 2024-03-04 ENCOUNTER — Other Ambulatory Visit (HOSPITAL_COMMUNITY): Payer: Self-pay

## 2024-03-04 DIAGNOSIS — Z1239 Encounter for other screening for malignant neoplasm of breast: Secondary | ICD-10-CM

## 2024-03-11 ENCOUNTER — Ambulatory Visit (HOSPITAL_COMMUNITY): Payer: Self-pay

## 2024-03-12 ENCOUNTER — Encounter (HOSPITAL_COMMUNITY): Payer: Self-pay

## 2024-04-02 ENCOUNTER — Ambulatory Visit (HOSPITAL_COMMUNITY): Payer: Self-pay

## 2024-04-02 ENCOUNTER — Other Ambulatory Visit: Payer: Self-pay

## 2024-04-02 ENCOUNTER — Ambulatory Visit
Admission: RE | Admit: 2024-04-02 | Discharge: 2024-04-02 | Disposition: A | Discharge status: Home / Self Care | Source: Ambulatory Visit | Attending: SLEEP MEDICINE | Admitting: SLEEP MEDICINE

## 2024-04-02 ENCOUNTER — Encounter (HOSPITAL_COMMUNITY): Payer: Self-pay

## 2024-04-02 ENCOUNTER — Other Ambulatory Visit (HOSPITAL_COMMUNITY): Payer: Self-pay | Admitting: SLEEP MEDICINE

## 2024-04-02 DIAGNOSIS — G4733 Obstructive sleep apnea (adult) (pediatric): Secondary | ICD-10-CM

## 2024-04-03 ENCOUNTER — Ambulatory Visit (HOSPITAL_COMMUNITY): Payer: Self-pay

## 2024-04-03 DIAGNOSIS — G4733 Obstructive sleep apnea (adult) (pediatric): Secondary | ICD-10-CM

## 2024-04-03 DIAGNOSIS — R0683 Snoring: Secondary | ICD-10-CM
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
## 2024-04-04 ENCOUNTER — Other Ambulatory Visit (HOSPITAL_COMMUNITY): Payer: Self-pay | Admitting: SLEEP MEDICINE

## 2024-04-04 DIAGNOSIS — G4733 Obstructive sleep apnea (adult) (pediatric): Secondary | ICD-10-CM

## 2024-04-09 ENCOUNTER — Encounter (HOSPITAL_COMMUNITY): Payer: Self-pay

## 2024-05-19 ENCOUNTER — Emergency Department (HOSPITAL_COMMUNITY)

## 2024-05-19 ENCOUNTER — Emergency Department
Admission: EM | Admit: 2024-05-19 | Discharge: 2024-05-19 | Disposition: A | Discharge status: Home / Self Care | Attending: Emergency Medicine | Admitting: Emergency Medicine

## 2024-05-19 ENCOUNTER — Encounter (HOSPITAL_COMMUNITY): Payer: Self-pay

## 2024-05-19 ENCOUNTER — Other Ambulatory Visit: Payer: Self-pay

## 2024-05-19 DIAGNOSIS — R109 Unspecified abdominal pain: Secondary | ICD-10-CM

## 2024-05-19 DIAGNOSIS — K2970 Gastritis, unspecified, without bleeding: Secondary | ICD-10-CM | POA: Insufficient documentation

## 2024-05-19 DIAGNOSIS — Z9049 Acquired absence of other specified parts of digestive tract: Secondary | ICD-10-CM | POA: Insufficient documentation

## 2024-05-19 DIAGNOSIS — K449 Diaphragmatic hernia without obstruction or gangrene: Secondary | ICD-10-CM | POA: Insufficient documentation

## 2024-05-19 DIAGNOSIS — R1032 Left lower quadrant pain: Secondary | ICD-10-CM | POA: Insufficient documentation

## 2024-05-19 DIAGNOSIS — Z87891 Personal history of nicotine dependence: Secondary | ICD-10-CM | POA: Insufficient documentation

## 2024-05-19 DIAGNOSIS — Z87440 Personal history of urinary (tract) infections: Secondary | ICD-10-CM | POA: Insufficient documentation

## 2024-05-19 DIAGNOSIS — E860 Dehydration: Secondary | ICD-10-CM | POA: Insufficient documentation

## 2024-05-19 DIAGNOSIS — K2900 Acute gastritis without bleeding: Secondary | ICD-10-CM

## 2024-05-19 LAB — CBC WITH DIFF
BASOPHIL #: 0 x10ˆ3/uL (ref 0.00–0.10)
BASOPHIL %: 1 % (ref 0–1)
EOSINOPHIL #: 0.1 x10ˆ3/uL (ref 0.00–0.50)
EOSINOPHIL %: 2 % (ref 1–7)
HCT: 35.1 % (ref 31.2–41.9)
HGB: 11.6 g/dL (ref 10.9–14.3)
LYMPHOCYTE #: 1.8 x10ˆ3/uL (ref 1.10–3.10)
LYMPHOCYTE %: 27 % (ref 16–46)
MCH: 26.3 pg (ref 24.7–32.8)
MCHC: 33 g/dL (ref 32.3–35.6)
MCV: 79.8 fL (ref 75.5–95.3)
MONOCYTE #: 0.5 x10ˆ3/uL (ref 0.20–0.90)
MONOCYTE %: 8 % (ref 4–11)
MPV: 9 fL (ref 7.9–10.8)
NEUTROPHIL #: 4.1 x10ˆ3/uL (ref 1.90–8.20)
NEUTROPHIL %: 62 % (ref 43–77)
PLATELETS: 241 x10ˆ3/uL (ref 140–440)
RBC: 4.4 x10ˆ6/uL (ref 3.63–4.92)
RDW: 16.6 % (ref 12.3–17.7)
WBC: 6.5 x10ˆ3/uL (ref 3.8–11.8)

## 2024-05-19 LAB — URINALYSIS, MACROSCOPIC
BILIRUBIN: NEGATIVE mg/dL
BLOOD: NEGATIVE mg/dL
GLUCOSE: >1000 mg/dL — AB
KETONES: 20 mg/dL — AB
LEUKOCYTES: NEGATIVE WBCs/uL
NITRITE: NEGATIVE
PH: 6 (ref 5.0–9.0)
PROTEIN: 50 mg/dL — AB
SPECIFIC GRAVITY: 1.039 — ABNORMAL HIGH (ref 1.002–1.030)
UROBILINOGEN: NORMAL mg/dL

## 2024-05-19 LAB — COMPREHENSIVE METABOLIC PANEL, NON-FASTING
ALBUMIN/GLOBULIN RATIO: 1.4 (ref 0.8–1.4)
ALBUMIN: 4.1 g/dL (ref 3.5–5.7)
ALKALINE PHOSPHATASE: 86 U/L (ref 34–104)
ALT (SGPT): 18 U/L (ref 7–52)
ANION GAP: 7 mmol/L (ref 4–13)
AST (SGOT): 19 U/L (ref 13–39)
BILIRUBIN TOTAL: 0.5 mg/dL (ref 0.3–1.0)
BUN/CREA RATIO: 19 (ref 6–22)
BUN: 18 mg/dL (ref 7–25)
CALCIUM, CORRECTED: 9 mg/dL (ref 8.9–10.8)
CALCIUM: 9.1 mg/dL (ref 8.6–10.3)
CHLORIDE: 102 mmol/L (ref 98–107)
CO2 TOTAL: 27 mmol/L (ref 21–31)
CREATININE: 0.96 mg/dL (ref 0.60–1.30)
ESTIMATED GFR: 73 mL/min/1.73mˆ2 (ref >59)
GLOBULIN: 2.9 (ref 2.0–3.5)
GLUCOSE: 172 mg/dL — ABNORMAL HIGH (ref 74–109)
OSMOLALITY, CALCULATED: 278 mosm/kg (ref 270–290)
POTASSIUM: 3.5 mmol/L (ref 3.5–5.1)
PROTEIN TOTAL: 7 g/dL (ref 6.4–8.9)
SODIUM: 136 mmol/L (ref 136–145)

## 2024-05-19 LAB — URINALYSIS, MICROSCOPIC
RBCS: 1 /HPF (ref <4)
SQUAMOUS EPITHELIAL: 9 /HPF (ref <28)
WBCS: 1 /HPF (ref <6)

## 2024-05-19 LAB — HCG QUALITATIVE PREGNANCY, SERUM: PREGNANCY, SERUM QUALITATIVE: NEGATIVE

## 2024-05-19 LAB — LIPASE: LIPASE: 53 U/L (ref 11–82)

## 2024-05-19 LAB — LACTIC ACID LEVEL W/ REFLEX FOR LEVEL >2.0: LACTIC ACID: 0.8 mmol/L (ref 0.5–2.2)

## 2024-05-19 LAB — ETHANOL, SERUM/PLASMA: ETHANOL: <10 mg/dL

## 2024-05-19 MED ORDER — KETOROLAC 30 MG/ML (1 ML) INJECTION SOLUTION
INTRAMUSCULAR | Status: AC
  Filled 2024-05-19: qty 1

## 2024-05-19 MED ORDER — ONDANSETRON HCL (PF) 4 MG/2 ML INJECTION SOLUTION
INTRAMUSCULAR | Status: AC
  Filled 2024-05-19: qty 2

## 2024-05-19 MED ORDER — IOHEXOL 350 MG IODINE/ML INTRAVENOUS SOLUTION
75.0000 mL | INTRAVENOUS | Status: AC
  Administered 2024-05-19: 75 mL via INTRAVENOUS

## 2024-05-19 MED ORDER — DEXTROSE 5% IN WATER (D5W) FLUSH BAG - 250 ML: INTRAVENOUS | Status: DC | PRN

## 2024-05-19 MED ORDER — KETOROLAC 30 MG/ML (1 ML) INJECTION SOLUTION
30.0000 mg | INTRAMUSCULAR | Status: AC
  Administered 2024-05-19: 30 mg via INTRAVENOUS

## 2024-05-19 MED ORDER — SODIUM CHLORIDE 0.9% FLUSH BAG - 250 ML: INTRAVENOUS | Status: DC | PRN

## 2024-05-19 MED ORDER — SODIUM CHLORIDE 0.9 % (FLUSH) INJECTION SYRINGE
3.0000 mL | INJECTION | Freq: Three times a day (TID) | INTRAMUSCULAR | Status: DC
  Administered 2024-05-19: 0 mL

## 2024-05-19 MED ORDER — SODIUM CHLORIDE 0.9 % IV BOLUS
1000.0000 mL | INJECTION | Status: AC
  Administered 2024-05-19: 1000 mL via INTRAVENOUS
  Administered 2024-05-19: 0 mL via INTRAVENOUS

## 2024-05-19 MED ORDER — ONDANSETRON HCL (PF) 4 MG/2 ML INJECTION SOLUTION
4.0000 mg | INTRAMUSCULAR | Status: AC
  Administered 2024-05-19: 4 mg via INTRAVENOUS

## 2024-05-19 MED ORDER — FAMOTIDINE 40 MG TABLET: 40.0000 mg | ORAL_TABLET | Freq: Every evening | ORAL | 0 refills | Status: AC

## 2024-05-19 MED ORDER — SODIUM CHLORIDE 0.9 % (FLUSH) INJECTION SYRINGE: 3.0000 mL | INJECTION | INTRAMUSCULAR | Status: DC | PRN

## 2024-05-19 MED ORDER — ONDANSETRON 4 MG DISINTEGRATING TABLET: 4.0000 mg | ORAL_TABLET | Freq: Three times a day (TID) | ORAL | 0 refills | Status: AC | PRN
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
## 2024-05-19 NOTE — ED Triage Notes (Signed)
 LLQ pain x 1 month, worsening started Friday night. Patient also reports nausea.

## 2024-05-19 NOTE — Discharge Instructions (Signed)
 Today in the ED, we completed a workup for your complaint of left side abdominal pain. The findings on your exam today and on your blood work and/or imaging is reassuring. Your workup did not show any emergent findings requiring hospitalization.    Sometimes, we do not always find the cause for your symptoms in one ER visit.  At this time, it is not 100% certain what is causing your symptoms, but we feel you can be discharged from the Emergency Department.     It is possible this may worsen or get better. Please, if you get worse or your symptoms change, return to ED. Otherwise, we strongly encourage you to follow up with primary care office within 24-48 hours or any of the specialists we have recommended.     Thank you for allowing us  to be part of your care.

## 2024-05-19 NOTE — ED Nurses Note (Signed)
 Patient arrived to ED10 with complaints of abdominal pain that started x1 month ago. Patient describes pain as sharp and rates pain 10/10. Patient reports taking Motrin  800mg  at 1900 without relief. Patient placed on bedside monitor and call light within reach.

## 2024-05-19 NOTE — ED Nurses Note (Signed)
 Patient discharged home.  AVS reviewed with patient/care giver.  A written copy of the AVS and discharge instructions was given to the patient/care giver.  Questions sufficiently answered as needed.  Patient/care giver encouraged to follow up with PCP as indicated.  In the event of an emergency, patient/care giver instructed to call 911 or go to the nearest emergency room.

## 2024-05-28 ENCOUNTER — Encounter (HOSPITAL_COMMUNITY): Payer: Self-pay

## 2024-05-28 ENCOUNTER — Other Ambulatory Visit: Payer: Self-pay

## 2024-05-28 ENCOUNTER — Emergency Department: Admission: EM | Admit: 2024-05-28 | Discharge: 2024-05-28 | Disposition: A | Discharge status: Home / Self Care

## 2024-05-28 DIAGNOSIS — R42 Dizziness and giddiness: Secondary | ICD-10-CM | POA: Insufficient documentation

## 2024-05-28 DIAGNOSIS — R739 Hyperglycemia, unspecified: Secondary | ICD-10-CM | POA: Insufficient documentation

## 2024-05-28 DIAGNOSIS — F419 Anxiety disorder, unspecified: Secondary | ICD-10-CM | POA: Insufficient documentation

## 2024-05-28 DIAGNOSIS — Z599 Problem related to housing and economic circumstances, unspecified: Secondary | ICD-10-CM | POA: Insufficient documentation

## 2024-05-28 LAB — COMPREHENSIVE METABOLIC PANEL, NON-FASTING
ALBUMIN/GLOBULIN RATIO: 1.3 (ref 0.8–1.4)
ALBUMIN: 3.7 g/dL (ref 3.5–5.7)
ALKALINE PHOSPHATASE: 73 U/L (ref 34–104)
ALT (SGPT): 19 U/L (ref 7–52)
ANION GAP: 6 mmol/L (ref 4–13)
AST (SGOT): 20 U/L (ref 13–39)
BILIRUBIN TOTAL: 0.4 mg/dL (ref 0.3–1.0)
BUN/CREA RATIO: 14 (ref 6–22)
BUN: 12 mg/dL (ref 7–25)
CALCIUM, CORRECTED: 9 mg/dL (ref 8.9–10.8)
CALCIUM: 8.8 mg/dL (ref 8.6–10.3)
CHLORIDE: 105 mmol/L (ref 98–107)
CO2 TOTAL: 24 mmol/L (ref 21–31)
CREATININE: 0.83 mg/dL (ref 0.60–1.30)
ESTIMATED GFR: 87 mL/min/1.73mˆ2 (ref >59)
GLOBULIN: 2.8 (ref 2.0–3.5)
GLUCOSE: 266 mg/dL — ABNORMAL HIGH (ref 74–109)
OSMOLALITY, CALCULATED: 279 mosm/kg (ref 270–290)
POTASSIUM: 3.5 mmol/L (ref 3.5–5.1)
PROTEIN TOTAL: 6.5 g/dL (ref 6.4–8.9)
SODIUM: 135 mmol/L — ABNORMAL LOW (ref 136–145)

## 2024-05-28 LAB — CBC WITH DIFF
BASOPHIL #: 0 x10ˆ3/uL (ref 0.00–0.10)
BASOPHIL %: 1 % (ref 0–1)
EOSINOPHIL #: 0.1 x10ˆ3/uL (ref 0.00–0.50)
EOSINOPHIL %: 1 % (ref 1–7)
HCT: 36 % (ref 31.2–41.9)
HGB: 12 g/dL (ref 10.9–14.3)
LYMPHOCYTE #: 1.6 x10ˆ3/uL (ref 1.10–3.10)
LYMPHOCYTE %: 24 % (ref 16–46)
MCH: 26.6 pg (ref 24.7–32.8)
MCHC: 33.4 g/dL (ref 32.3–35.6)
MCV: 79.7 fL (ref 75.5–95.3)
MONOCYTE #: 0.3 x10ˆ3/uL (ref 0.20–0.90)
MONOCYTE %: 5 % (ref 4–11)
MPV: 9.2 fL (ref 7.9–10.8)
NEUTROPHIL #: 4.6 x10ˆ3/uL (ref 1.90–8.20)
NEUTROPHIL %: 69 % (ref 43–77)
PLATELETS: 177 x10ˆ3/uL (ref 140–440)
RBC: 4.52 x10ˆ6/uL (ref 3.63–4.92)
RDW: 16.4 % (ref 12.3–17.7)
WBC: 6.7 x10ˆ3/uL (ref 3.8–11.8)

## 2024-05-28 LAB — URINALYSIS, MACROSCOPIC
BILIRUBIN: NEGATIVE mg/dL
BLOOD: NEGATIVE mg/dL
GLUCOSE: >1000 mg/dL — AB
KETONES: 10 mg/dL — AB
LEUKOCYTES: 25 WBCs/uL — AB
NITRITE: NEGATIVE
PH: 6 (ref 5.0–9.0)
PROTEIN: 100 mg/dL — AB
SPECIFIC GRAVITY: 1.029 (ref 1.002–1.030)
UROBILINOGEN: NORMAL mg/dL

## 2024-05-28 LAB — PT/INR
INR: 0.97 (ref 0.84–1.10)
PROTHROMBIN TIME: 11 s (ref 9.8–12.7)

## 2024-05-28 LAB — URINALYSIS, MICROSCOPIC
RBCS: 4 /HPF — ABNORMAL HIGH (ref <4)
SQUAMOUS EPITHELIAL: 7 /HPF (ref <28)
WBCS: 25 /HPF — ABNORMAL HIGH (ref <6)

## 2024-05-28 LAB — TROPONIN-I: TROPONIN I: 3 ng/L (ref <15)

## 2024-05-28 LAB — PTT (PARTIAL THROMBOPLASTIN TIME): APTT: 27.3 s (ref 25.0–38.0)

## 2024-05-28 LAB — POC BLOOD GLUCOSE (RESULTS): GLUCOSE, POC: 277 mg/dL — ABNORMAL HIGH (ref 70–100)

## 2024-05-28 LAB — MAGNESIUM: MAGNESIUM: 1.8 mg/dL — ABNORMAL LOW (ref 1.9–2.7)

## 2024-05-28 MED ORDER — INSULIN REGULAR HUMAN 100 UNIT/ML INJECTION - CHARGE BY DOSE
5.0000 [IU] | INTRAMUSCULAR | Status: AC
  Administered 2024-05-28: 5 [IU] via SUBCUTANEOUS

## 2024-05-28 MED ORDER — INSULIN REGULAR HUMAN 100 UNIT/ML INJ FOR MIXTURES -RX ADMIN UNIT ROUNDS TO NEAREST 0.01 ML
INTRAMUSCULAR | Status: AC
  Filled 2024-05-28: qty 5

## 2024-05-28 MED ORDER — ONDANSETRON 4 MG DISINTEGRATING TABLET
ORAL_TABLET | ORAL | Status: AC
  Filled 2024-05-28: qty 1

## 2024-05-28 MED ORDER — ONDANSETRON 4 MG DISINTEGRATING TABLET
4.0000 mg | ORAL_TABLET | ORAL | Status: AC
  Administered 2024-05-28: 4 mg via ORAL

## 2024-05-28 NOTE — ED Nurses Note (Signed)
 Patient visibly upset upon assessment. Patient states very stressed out due to home life, states gets no sleep. Patient c/o LLQ abdominal pain and nausea. States was seen here last Sunday for same symptoms, was diagnosed with gastritis. Patient also c/o dizziness when standing. Hx hypertension, takes BP meds regularly. Patient A&Ox4, RA. Call bell within reach.
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
## 2024-05-28 NOTE — ED Nurses Note (Signed)
 Urine sent. Patient denies needs.

## 2024-05-28 NOTE — ED Nurses Note (Signed)
 Patient discharged home with family. AVS reviewed with patient. A written copy of the AVS and discharge instructions were given to the patient. Questions sufficiently answered as needed. Patient encouraged to follow up with PCP as indicated. In the event of an emergency, patient instructed to call 911 or go to the nearest emergency room. Patient left department via ambulation.
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
## 2024-05-28 NOTE — ED Nurses Note (Signed)
 Patient resting on stretcher in position of most comfort. Patient provided with warm blanket at this time. Denies any other needs at present.
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
## 2024-05-28 NOTE — ED Nurses Note (Signed)
 Patient resting on stretcher in position of most comfort , lunch at bedside. Denies needs. Call bell in reach.

## 2024-05-28 NOTE — Discharge Instructions (Signed)
 Thank you for allowing [place] to be part of your care.    Please discuss all medications with your pharmacist to ensure there are no concerns of interactions.    We discussed further work-up/observation for your complaint, however you wanted to go home. Please follow-up with your regular doctor. By going home, it is possible there are problems which go undiagnosed, which can result in bad outcomes.    Please ensure all questions or concerns are addressed prior to leaving the hospital. We want to make sure your concerns are addressed to make sure you are as safe and healthy as possible. By leaving the hospital, it is understood you are in agreement with your treatment plan.    You may have received sedating medication during your visit. Please discuss this with your discharging provider nurse as you may not be able to operate machines while the medication is in your system, or while you are taking any potentially sedating prescriptions.    Please call the hospital medical records office for a copy of your finalized results, and review them with a primary care physician, for any findings needing further attention.    If you feel your situation worsens, or does not get better in 48 hours, please see a physician for evaluation.    We encourage you to see your regular doctor as soon as possible to let them know you were seen in the emergency department. They may want to do further testing. If you do not have a doctor, please feel free to call the hospital, and ask for contact information of accepting providers. Please also discuss your vaccinations, and ensure all are up to date.    You may use this document to take today off of Work/School.

## 2024-05-29 ENCOUNTER — Ambulatory Visit (HOSPITAL_COMMUNITY): Payer: Self-pay | Admitting: NURSE PRACTITIONER

## 2024-05-30 LAB — URINE CULTURE,ROUTINE: URINE CULTURE: >100000 — AB

## 2024-05-30 MED ORDER — CEFDINIR 300 MG CAPSULE: 300.0000 mg | ORAL_CAPSULE | Freq: Two times a day (BID) | ORAL | 0 refills | Status: AC

## 2024-06-05 ENCOUNTER — Ambulatory Visit (INDEPENDENT_AMBULATORY_CARE_PROVIDER_SITE_OTHER): Admitting: PHYSICIAN ASSISTANT

## 2024-06-26 ENCOUNTER — Ambulatory Visit (INDEPENDENT_AMBULATORY_CARE_PROVIDER_SITE_OTHER)

## 2024-07-29 ENCOUNTER — Ambulatory Visit (HOSPITAL_COMMUNITY): Payer: Self-pay

## 2024-09-03 ENCOUNTER — Ambulatory Visit (HOSPITAL_COMMUNITY): Payer: Self-pay | Admitting: SLEEP MEDICINE
